# Patient Record
Sex: Female | Born: 1935 | Race: White | Hispanic: No | Marital: Married | Smoking: Never smoker
Health system: Southern US, Community
[De-identification: ages and names within clinical notes are randomized; demographics above are authoritative.]

## PROBLEM LIST (undated history)

## (undated) DIAGNOSIS — R21 Rash and other nonspecific skin eruption: Secondary | ICD-10-CM

## (undated) DIAGNOSIS — F419 Anxiety disorder, unspecified: Secondary | ICD-10-CM

## (undated) DIAGNOSIS — M1990 Unspecified osteoarthritis, unspecified site: Secondary | ICD-10-CM

## (undated) DIAGNOSIS — K449 Diaphragmatic hernia without obstruction or gangrene: Secondary | ICD-10-CM

## (undated) DIAGNOSIS — I639 Cerebral infarction, unspecified: Secondary | ICD-10-CM

## (undated) DIAGNOSIS — E039 Hypothyroidism, unspecified: Secondary | ICD-10-CM

## (undated) DIAGNOSIS — K5790 Diverticulosis of intestine, part unspecified, without perforation or abscess without bleeding: Secondary | ICD-10-CM

## (undated) DIAGNOSIS — K5520 Angiodysplasia of colon without hemorrhage: Secondary | ICD-10-CM

## (undated) DIAGNOSIS — T4145XA Adverse effect of unspecified anesthetic, initial encounter: Secondary | ICD-10-CM

## (undated) DIAGNOSIS — I4891 Unspecified atrial fibrillation: Secondary | ICD-10-CM

## (undated) DIAGNOSIS — I5189 Other ill-defined heart diseases: Secondary | ICD-10-CM

## (undated) DIAGNOSIS — K639 Disease of intestine, unspecified: Secondary | ICD-10-CM

## (undated) DIAGNOSIS — K648 Other hemorrhoids: Secondary | ICD-10-CM

## (undated) DIAGNOSIS — I10 Essential (primary) hypertension: Secondary | ICD-10-CM

## (undated) DIAGNOSIS — J449 Chronic obstructive pulmonary disease, unspecified: Secondary | ICD-10-CM

## (undated) DIAGNOSIS — K644 Residual hemorrhoidal skin tags: Secondary | ICD-10-CM

## (undated) DIAGNOSIS — I425 Other restrictive cardiomyopathy: Secondary | ICD-10-CM

## (undated) DIAGNOSIS — I509 Heart failure, unspecified: Secondary | ICD-10-CM

## (undated) DIAGNOSIS — E785 Hyperlipidemia, unspecified: Secondary | ICD-10-CM

## (undated) DIAGNOSIS — Z8489 Family history of other specified conditions: Secondary | ICD-10-CM

## (undated) DIAGNOSIS — K270 Acute peptic ulcer, site unspecified, with hemorrhage: Secondary | ICD-10-CM

## (undated) DIAGNOSIS — G4730 Sleep apnea, unspecified: Secondary | ICD-10-CM

## (undated) DIAGNOSIS — E611 Iron deficiency: Secondary | ICD-10-CM

## (undated) DIAGNOSIS — Z9989 Dependence on other enabling machines and devices: Secondary | ICD-10-CM

## (undated) DIAGNOSIS — D126 Benign neoplasm of colon, unspecified: Secondary | ICD-10-CM

## (undated) DIAGNOSIS — K219 Gastro-esophageal reflux disease without esophagitis: Secondary | ICD-10-CM

## (undated) DIAGNOSIS — I2510 Atherosclerotic heart disease of native coronary artery without angina pectoris: Secondary | ICD-10-CM

## (undated) DIAGNOSIS — Z9981 Dependence on supplemental oxygen: Secondary | ICD-10-CM

## (undated) DIAGNOSIS — N19 Unspecified kidney failure: Secondary | ICD-10-CM

## (undated) DIAGNOSIS — T8859XA Other complications of anesthesia, initial encounter: Secondary | ICD-10-CM

## (undated) DIAGNOSIS — D649 Anemia, unspecified: Secondary | ICD-10-CM

## (undated) HISTORY — DX: Heart failure, unspecified: I50.9

## (undated) HISTORY — PX: MOLE REMOVAL: SHX2046

## (undated) HISTORY — DX: Unspecified atrial fibrillation: I48.91

## (undated) HISTORY — DX: Hyperlipidemia, unspecified: E78.5

## (undated) HISTORY — DX: Diaphragmatic hernia without obstruction or gangrene: K44.9

## (undated) HISTORY — DX: Other restrictive cardiomyopathy: I42.5

## (undated) HISTORY — DX: Dependence on supplemental oxygen: Z99.81

## (undated) HISTORY — DX: Hypothyroidism, unspecified: E03.9

## (undated) HISTORY — PX: BASCILIC VEIN TRANSPOSITION: SHX5742

## (undated) HISTORY — PX: GIVENS CAPSULE STUDY: SHX5432

## (undated) HISTORY — DX: Cerebral infarction, unspecified: I63.9

## (undated) HISTORY — DX: Essential (primary) hypertension: I10

## (undated) HISTORY — PX: VAGINAL HYSTERECTOMY: SUR661

## (undated) HISTORY — PX: ESOPHAGOGASTRODUODENOSCOPY: SHX1529

## (undated) HISTORY — DX: Atherosclerotic heart disease of native coronary artery without angina pectoris: I25.10

## (undated) HISTORY — DX: Sleep apnea, unspecified: G47.30

## (undated) HISTORY — DX: Diverticulosis of intestine, part unspecified, without perforation or abscess without bleeding: K57.90

## (undated) HISTORY — DX: Benign neoplasm of colon, unspecified: D12.6

## (undated) HISTORY — DX: Anxiety disorder, unspecified: F41.9

## (undated) HISTORY — PX: CARDIAC CATHETERIZATION: SHX172

## (undated) HISTORY — DX: Gastro-esophageal reflux disease without esophagitis: K21.9

## (undated) HISTORY — DX: Acute peptic ulcer, site unspecified, with hemorrhage: K27.0

## (undated) HISTORY — DX: Chronic obstructive pulmonary disease, unspecified: J44.9

## (undated) HISTORY — PX: COLONOSCOPY: SHX174

## (undated) HISTORY — DX: Dependence on other enabling machines and devices: Z99.89

## (undated) HISTORY — DX: Other hemorrhoids: K64.8

## (undated) HISTORY — DX: Residual hemorrhoidal skin tags: K64.4

## (undated) HISTORY — DX: Iron deficiency: E61.1

## (undated) HISTORY — DX: Other ill-defined heart diseases: I51.89

## (undated) HISTORY — PX: APPENDECTOMY: SHX54

## (undated) HISTORY — PX: OOPHORECTOMY: SHX86

## (undated) HISTORY — DX: Morbid (severe) obesity due to excess calories: E66.01

## (undated) HISTORY — DX: Unspecified osteoarthritis, unspecified site: M19.90

## (undated) HISTORY — DX: Unspecified kidney failure: N19

## (undated) HISTORY — DX: Anemia, unspecified: D64.9

---

## 1994-12-10 DIAGNOSIS — I4891 Unspecified atrial fibrillation: Secondary | ICD-10-CM

## 1994-12-10 HISTORY — DX: Unspecified atrial fibrillation: I48.91

## 1997-12-10 DIAGNOSIS — I639 Cerebral infarction, unspecified: Secondary | ICD-10-CM

## 1997-12-10 HISTORY — DX: Cerebral infarction, unspecified: I63.9

## 1999-03-22 ENCOUNTER — Ambulatory Visit (HOSPITAL_COMMUNITY): Admission: RE | Admit: 1999-03-22 | Discharge: 1999-03-22 | Payer: Self-pay | Admitting: Internal Medicine

## 1999-11-29 ENCOUNTER — Encounter: Admission: RE | Admit: 1999-11-29 | Discharge: 1999-11-29 | Payer: Self-pay | Admitting: Internal Medicine

## 1999-11-29 ENCOUNTER — Encounter: Payer: Self-pay | Admitting: Internal Medicine

## 2000-04-20 ENCOUNTER — Inpatient Hospital Stay (HOSPITAL_COMMUNITY): Admission: EM | Admit: 2000-04-20 | Discharge: 2000-04-24 | Payer: Self-pay | Admitting: Emergency Medicine

## 2000-04-20 ENCOUNTER — Encounter (INDEPENDENT_AMBULATORY_CARE_PROVIDER_SITE_OTHER): Payer: Self-pay

## 2000-04-22 ENCOUNTER — Encounter: Payer: Self-pay | Admitting: Internal Medicine

## 2000-06-25 ENCOUNTER — Encounter: Payer: Self-pay | Admitting: Gastroenterology

## 2000-06-25 ENCOUNTER — Other Ambulatory Visit: Admission: RE | Admit: 2000-06-25 | Discharge: 2000-06-25 | Payer: Self-pay | Admitting: Internal Medicine

## 2000-06-25 ENCOUNTER — Encounter (INDEPENDENT_AMBULATORY_CARE_PROVIDER_SITE_OTHER): Payer: Self-pay | Admitting: Specialist

## 2000-11-29 ENCOUNTER — Encounter: Payer: Self-pay | Admitting: Internal Medicine

## 2000-11-29 ENCOUNTER — Encounter: Admission: RE | Admit: 2000-11-29 | Discharge: 2000-11-29 | Payer: Self-pay | Admitting: Internal Medicine

## 2002-07-02 ENCOUNTER — Encounter: Admission: RE | Admit: 2002-07-02 | Discharge: 2002-07-02 | Payer: Self-pay | Admitting: Internal Medicine

## 2002-07-02 ENCOUNTER — Encounter: Payer: Self-pay | Admitting: Internal Medicine

## 2003-08-26 ENCOUNTER — Encounter: Admission: RE | Admit: 2003-08-26 | Discharge: 2003-08-26 | Payer: Self-pay | Admitting: Internal Medicine

## 2003-08-26 ENCOUNTER — Encounter: Payer: Self-pay | Admitting: Internal Medicine

## 2003-09-07 ENCOUNTER — Encounter: Payer: Self-pay | Admitting: Internal Medicine

## 2003-09-07 ENCOUNTER — Encounter: Admission: RE | Admit: 2003-09-07 | Discharge: 2003-09-07 | Payer: Self-pay | Admitting: Internal Medicine

## 2003-09-24 ENCOUNTER — Encounter: Admission: RE | Admit: 2003-09-24 | Discharge: 2003-09-24 | Payer: Self-pay | Admitting: Internal Medicine

## 2003-09-24 ENCOUNTER — Encounter: Payer: Self-pay | Admitting: Internal Medicine

## 2004-03-23 ENCOUNTER — Inpatient Hospital Stay (HOSPITAL_COMMUNITY): Admission: RE | Admit: 2004-03-23 | Discharge: 2004-03-29 | Payer: Self-pay | Admitting: Cardiology

## 2004-03-28 ENCOUNTER — Encounter (INDEPENDENT_AMBULATORY_CARE_PROVIDER_SITE_OTHER): Payer: Self-pay | Admitting: *Deleted

## 2004-03-28 ENCOUNTER — Encounter: Payer: Self-pay | Admitting: Gastroenterology

## 2004-10-03 ENCOUNTER — Encounter: Admission: RE | Admit: 2004-10-03 | Discharge: 2004-10-03 | Payer: Self-pay | Admitting: Internal Medicine

## 2004-10-20 ENCOUNTER — Ambulatory Visit: Payer: Self-pay | Admitting: Cardiology

## 2004-11-10 ENCOUNTER — Ambulatory Visit: Payer: Self-pay | Admitting: Cardiology

## 2004-11-16 ENCOUNTER — Ambulatory Visit: Payer: Self-pay | Admitting: Endocrinology

## 2004-11-24 ENCOUNTER — Ambulatory Visit: Payer: Self-pay | Admitting: Cardiology

## 2004-12-21 ENCOUNTER — Ambulatory Visit: Payer: Self-pay

## 2005-01-04 ENCOUNTER — Ambulatory Visit: Payer: Self-pay | Admitting: Internal Medicine

## 2005-01-22 ENCOUNTER — Ambulatory Visit: Payer: Self-pay | Admitting: Cardiology

## 2005-02-19 ENCOUNTER — Ambulatory Visit: Payer: Self-pay | Admitting: Cardiology

## 2005-03-12 ENCOUNTER — Ambulatory Visit: Payer: Self-pay | Admitting: Cardiology

## 2005-04-09 ENCOUNTER — Ambulatory Visit: Payer: Self-pay | Admitting: Cardiology

## 2005-04-12 ENCOUNTER — Ambulatory Visit: Payer: Self-pay | Admitting: Internal Medicine

## 2005-05-08 ENCOUNTER — Ambulatory Visit: Payer: Self-pay | Admitting: Cardiology

## 2005-06-05 ENCOUNTER — Ambulatory Visit: Payer: Self-pay | Admitting: Cardiology

## 2005-06-22 ENCOUNTER — Ambulatory Visit: Payer: Self-pay | Admitting: Cardiovascular Disease

## 2005-07-03 ENCOUNTER — Ambulatory Visit: Payer: Self-pay | Admitting: Cardiology

## 2005-07-20 ENCOUNTER — Ambulatory Visit: Payer: Self-pay | Admitting: Cardiology

## 2005-08-17 ENCOUNTER — Ambulatory Visit: Payer: Self-pay | Admitting: Internal Medicine

## 2005-09-04 ENCOUNTER — Ambulatory Visit: Payer: Self-pay | Admitting: *Deleted

## 2005-10-01 ENCOUNTER — Ambulatory Visit: Payer: Self-pay | Admitting: Cardiology

## 2005-10-05 ENCOUNTER — Encounter: Admission: RE | Admit: 2005-10-05 | Discharge: 2005-10-05 | Payer: Self-pay | Admitting: Internal Medicine

## 2005-10-15 ENCOUNTER — Ambulatory Visit: Payer: Self-pay | Admitting: Internal Medicine

## 2005-10-16 ENCOUNTER — Ambulatory Visit: Payer: Self-pay | Admitting: Internal Medicine

## 2005-10-23 ENCOUNTER — Ambulatory Visit: Payer: Self-pay | Admitting: Endocrinology

## 2005-11-05 ENCOUNTER — Ambulatory Visit: Payer: Self-pay | Admitting: Cardiology

## 2005-11-26 ENCOUNTER — Ambulatory Visit: Payer: Self-pay | Admitting: Cardiology

## 2005-12-10 DIAGNOSIS — J449 Chronic obstructive pulmonary disease, unspecified: Secondary | ICD-10-CM

## 2005-12-10 HISTORY — DX: Chronic obstructive pulmonary disease, unspecified: J44.9

## 2005-12-24 ENCOUNTER — Ambulatory Visit: Payer: Self-pay

## 2006-01-23 ENCOUNTER — Ambulatory Visit: Payer: Self-pay | Admitting: Internal Medicine

## 2006-02-20 ENCOUNTER — Ambulatory Visit: Payer: Self-pay | Admitting: Cardiology

## 2006-02-26 ENCOUNTER — Ambulatory Visit: Payer: Self-pay | Admitting: Cardiology

## 2006-03-04 ENCOUNTER — Ambulatory Visit: Payer: Self-pay | Admitting: Internal Medicine

## 2006-03-05 ENCOUNTER — Ambulatory Visit: Payer: Self-pay | Admitting: Internal Medicine

## 2006-03-07 ENCOUNTER — Ambulatory Visit: Payer: Self-pay | Admitting: Internal Medicine

## 2006-03-11 ENCOUNTER — Ambulatory Visit: Payer: Self-pay | Admitting: Cardiology

## 2006-03-20 ENCOUNTER — Ambulatory Visit: Payer: Self-pay | Admitting: *Deleted

## 2006-04-10 ENCOUNTER — Ambulatory Visit: Payer: Self-pay

## 2006-04-15 ENCOUNTER — Ambulatory Visit: Payer: Self-pay | Admitting: Internal Medicine

## 2006-04-17 ENCOUNTER — Ambulatory Visit: Payer: Self-pay | Admitting: Cardiology

## 2006-04-17 ENCOUNTER — Encounter: Admission: RE | Admit: 2006-04-17 | Discharge: 2006-04-17 | Payer: Self-pay | Admitting: Nephrology

## 2006-05-03 ENCOUNTER — Ambulatory Visit: Payer: Self-pay | Admitting: Pulmonary Disease

## 2006-05-08 ENCOUNTER — Ambulatory Visit: Payer: Self-pay | Admitting: Internal Medicine

## 2006-05-16 ENCOUNTER — Ambulatory Visit: Payer: Self-pay | Admitting: Cardiology

## 2006-05-16 ENCOUNTER — Ambulatory Visit: Payer: Self-pay

## 2006-05-30 ENCOUNTER — Ambulatory Visit: Payer: Self-pay | Admitting: Internal Medicine

## 2006-06-03 ENCOUNTER — Ambulatory Visit: Payer: Self-pay | Admitting: Cardiology

## 2006-06-06 ENCOUNTER — Ambulatory Visit: Payer: Self-pay | Admitting: Internal Medicine

## 2006-06-06 ENCOUNTER — Inpatient Hospital Stay (HOSPITAL_COMMUNITY): Admission: AD | Admit: 2006-06-06 | Discharge: 2006-06-10 | Payer: Self-pay | Admitting: Internal Medicine

## 2006-06-17 ENCOUNTER — Ambulatory Visit: Payer: Self-pay | Admitting: Internal Medicine

## 2006-06-20 ENCOUNTER — Ambulatory Visit: Payer: Self-pay | Admitting: Internal Medicine

## 2006-06-25 ENCOUNTER — Ambulatory Visit: Payer: Self-pay | Admitting: Cardiology

## 2006-06-26 ENCOUNTER — Ambulatory Visit: Payer: Self-pay | Admitting: *Deleted

## 2006-07-02 ENCOUNTER — Ambulatory Visit: Payer: Self-pay | Admitting: *Deleted

## 2006-07-02 ENCOUNTER — Ambulatory Visit: Payer: Self-pay

## 2006-07-18 ENCOUNTER — Ambulatory Visit: Payer: Self-pay | Admitting: Cardiology

## 2006-07-26 ENCOUNTER — Ambulatory Visit: Payer: Self-pay | Admitting: Internal Medicine

## 2006-08-06 ENCOUNTER — Ambulatory Visit: Payer: Self-pay | Admitting: *Deleted

## 2006-08-23 ENCOUNTER — Ambulatory Visit: Payer: Self-pay | Admitting: Internal Medicine

## 2006-08-26 ENCOUNTER — Ambulatory Visit: Payer: Self-pay | Admitting: Internal Medicine

## 2006-09-20 ENCOUNTER — Ambulatory Visit: Payer: Self-pay | Admitting: Cardiology

## 2006-09-26 ENCOUNTER — Ambulatory Visit: Payer: Self-pay | Admitting: Internal Medicine

## 2006-10-18 ENCOUNTER — Ambulatory Visit: Payer: Self-pay | Admitting: Cardiology

## 2006-10-25 ENCOUNTER — Ambulatory Visit: Payer: Self-pay | Admitting: Cardiovascular Disease

## 2006-10-28 ENCOUNTER — Ambulatory Visit: Payer: Self-pay | Admitting: Cardiology

## 2006-11-07 ENCOUNTER — Ambulatory Visit: Payer: Self-pay | Admitting: Internal Medicine

## 2006-11-14 ENCOUNTER — Encounter: Admission: RE | Admit: 2006-11-14 | Discharge: 2006-11-14 | Payer: Self-pay | Admitting: Internal Medicine

## 2006-11-15 ENCOUNTER — Ambulatory Visit: Payer: Self-pay | Admitting: Cardiology

## 2006-11-25 ENCOUNTER — Encounter: Payer: Self-pay | Admitting: Internal Medicine

## 2006-12-18 ENCOUNTER — Ambulatory Visit: Payer: Self-pay | Admitting: Internal Medicine

## 2006-12-18 LAB — CONVERTED CEMR LAB
ALT: 17 units/L (ref 0–40)
AST: 24 units/L (ref 0–37)
BUN: 36 mg/dL — ABNORMAL HIGH (ref 6–23)
Basophils Absolute: 0 10*3/uL (ref 0.0–0.1)
Basophils Relative: 0.8 % (ref 0.0–1.0)
Creatinine, Ser: 1.3 mg/dL — ABNORMAL HIGH (ref 0.4–1.2)
Eosinophil percent: 4.1 % (ref 0.0–5.0)
HCT: 29.3 % — ABNORMAL LOW (ref 36.0–46.0)
Hemoglobin: 9.8 g/dL — ABNORMAL LOW (ref 12.0–15.0)
Lymphocytes Relative: 13.1 % (ref 12.0–46.0)
MCHC: 33.6 g/dL (ref 30.0–36.0)
MCV: 104.2 fL — ABNORMAL HIGH (ref 78.0–100.0)
Monocytes Absolute: 0.7 10*3/uL (ref 0.2–0.7)
Monocytes Relative: 11.4 % — ABNORMAL HIGH (ref 3.0–11.0)
Neutro Abs: 4.2 10*3/uL (ref 1.4–7.7)
Neutrophils Relative %: 70.6 % (ref 43.0–77.0)
Platelets: 192 10*3/uL (ref 150–400)
Potassium: 4 meq/L (ref 3.5–5.1)
Pro B Natriuretic peptide (BNP): 359 pg/mL — ABNORMAL HIGH (ref 0.0–100.0)
RBC: 2.87 M/uL — ABNORMAL LOW (ref 3.87–5.11)
RDW: 15.5 % — ABNORMAL HIGH (ref 11.5–14.6)
WBC: 5.9 10*3/uL (ref 4.5–10.5)

## 2007-01-07 ENCOUNTER — Ambulatory Visit (HOSPITAL_BASED_OUTPATIENT_CLINIC_OR_DEPARTMENT_OTHER): Admission: RE | Admit: 2007-01-07 | Discharge: 2007-01-07 | Payer: Self-pay | Admitting: Internal Medicine

## 2007-01-09 DIAGNOSIS — I6350 Cerebral infarction due to unspecified occlusion or stenosis of unspecified cerebral artery: Secondary | ICD-10-CM | POA: Insufficient documentation

## 2007-01-09 DIAGNOSIS — Z8711 Personal history of peptic ulcer disease: Secondary | ICD-10-CM

## 2007-01-09 DIAGNOSIS — E039 Hypothyroidism, unspecified: Secondary | ICD-10-CM | POA: Insufficient documentation

## 2007-01-09 DIAGNOSIS — I10 Essential (primary) hypertension: Secondary | ICD-10-CM | POA: Insufficient documentation

## 2007-01-20 ENCOUNTER — Ambulatory Visit (HOSPITAL_BASED_OUTPATIENT_CLINIC_OR_DEPARTMENT_OTHER): Admission: RE | Admit: 2007-01-20 | Discharge: 2007-01-20 | Payer: Self-pay | Admitting: Internal Medicine

## 2007-01-29 ENCOUNTER — Ambulatory Visit: Payer: Self-pay | Admitting: Pulmonary Disease

## 2007-02-10 ENCOUNTER — Ambulatory Visit: Payer: Self-pay | Admitting: Pulmonary Disease

## 2007-02-13 ENCOUNTER — Ambulatory Visit: Payer: Self-pay | Admitting: Cardiology

## 2007-02-13 LAB — CONVERTED CEMR LAB
Basophils Absolute: 0 10*3/uL (ref 0.0–0.1)
Basophils Relative: 0.1 % (ref 0.0–1.0)
Eosinophils Absolute: 0.3 10*3/uL (ref 0.0–0.6)
Eosinophils Relative: 4.4 % (ref 0.0–5.0)
HCT: 34 % — ABNORMAL LOW (ref 36.0–46.0)
Hemoglobin: 11.6 g/dL — ABNORMAL LOW (ref 12.0–15.0)
Lymphocytes Relative: 14.6 % (ref 12.0–46.0)
MCHC: 34 g/dL (ref 30.0–36.0)
MCV: 99.9 fL (ref 78.0–100.0)
Monocytes Absolute: 0.9 10*3/uL — ABNORMAL HIGH (ref 0.2–0.7)
Monocytes Relative: 12.7 % — ABNORMAL HIGH (ref 3.0–11.0)
Neutro Abs: 4.8 10*3/uL (ref 1.4–7.7)
Neutrophils Relative %: 68.2 % (ref 43.0–77.0)
Platelets: 190 10*3/uL (ref 150–400)
RBC: 3.4 M/uL — ABNORMAL LOW (ref 3.87–5.11)
RDW: 13.6 % (ref 11.5–14.6)
WBC: 7 10*3/uL (ref 4.5–10.5)

## 2007-03-04 ENCOUNTER — Ambulatory Visit: Payer: Self-pay | Admitting: Pulmonary Disease

## 2007-03-04 ENCOUNTER — Ambulatory Visit (HOSPITAL_BASED_OUTPATIENT_CLINIC_OR_DEPARTMENT_OTHER): Admission: RE | Admit: 2007-03-04 | Discharge: 2007-03-04 | Payer: Self-pay | Admitting: Pulmonary Disease

## 2007-03-28 ENCOUNTER — Ambulatory Visit: Payer: Self-pay | Admitting: Pulmonary Disease

## 2007-05-09 ENCOUNTER — Telehealth (INDEPENDENT_AMBULATORY_CARE_PROVIDER_SITE_OTHER): Payer: Self-pay | Admitting: *Deleted

## 2007-05-12 ENCOUNTER — Telehealth (INDEPENDENT_AMBULATORY_CARE_PROVIDER_SITE_OTHER): Payer: Self-pay | Admitting: *Deleted

## 2007-05-20 ENCOUNTER — Ambulatory Visit: Payer: Self-pay | Admitting: Internal Medicine

## 2007-05-27 ENCOUNTER — Ambulatory Visit: Payer: Self-pay | Admitting: Internal Medicine

## 2007-05-27 LAB — CONVERTED CEMR LAB
BUN: 91 mg/dL (ref 6–23)
CO2: 37 meq/L — ABNORMAL HIGH (ref 19–32)
Calcium: 9.3 mg/dL (ref 8.4–10.5)
Chloride: 92 meq/L — ABNORMAL LOW (ref 96–112)
Creatinine, Ser: 1.8 mg/dL — ABNORMAL HIGH (ref 0.4–1.2)
GFR calc Af Amer: 35 mL/min
GFR calc non Af Amer: 29 mL/min
Glucose, Bld: 88 mg/dL (ref 70–99)
Potassium: 3.4 meq/L — ABNORMAL LOW (ref 3.5–5.1)
Pro B Natriuretic peptide (BNP): 186 pg/mL — ABNORMAL HIGH (ref 0.0–100.0)
Sodium: 137 meq/L (ref 135–145)
Uric Acid, Serum: 6.8 mg/dL (ref 2.4–7.0)

## 2007-06-04 ENCOUNTER — Ambulatory Visit: Payer: Self-pay | Admitting: Internal Medicine

## 2007-06-04 LAB — CONVERTED CEMR LAB
Chloride: 97 meq/L (ref 96–112)
GFR calc non Af Amer: 46 mL/min
Potassium: 3.8 meq/L (ref 3.5–5.1)
Sodium: 146 meq/L — ABNORMAL HIGH (ref 135–145)

## 2007-06-05 ENCOUNTER — Ambulatory Visit: Payer: Self-pay | Admitting: Internal Medicine

## 2007-06-05 LAB — CONVERTED CEMR LAB: TSH: 2.25 microintl units/mL (ref 0.35–5.50)

## 2007-06-06 ENCOUNTER — Encounter (INDEPENDENT_AMBULATORY_CARE_PROVIDER_SITE_OTHER): Payer: Self-pay | Admitting: *Deleted

## 2007-06-19 ENCOUNTER — Ambulatory Visit: Payer: Self-pay | Admitting: Internal Medicine

## 2007-06-19 DIAGNOSIS — D649 Anemia, unspecified: Secondary | ICD-10-CM

## 2007-06-19 DIAGNOSIS — Z8739 Personal history of other diseases of the musculoskeletal system and connective tissue: Secondary | ICD-10-CM | POA: Insufficient documentation

## 2007-06-22 LAB — CONVERTED CEMR LAB
BUN: 32 mg/dL — ABNORMAL HIGH (ref 6–23)
Basophils Relative: 0.1 % (ref 0.0–1.0)
Creatinine, Ser: 1.2 mg/dL (ref 0.4–1.2)
Hemoglobin: 11.2 g/dL — ABNORMAL LOW (ref 12.0–15.0)
Monocytes Relative: 9 % (ref 3.0–11.0)
Platelets: 205 10*3/uL (ref 150–400)
Potassium: 3.8 meq/L (ref 3.5–5.1)
RDW: 13.8 % (ref 11.5–14.6)
Uric Acid, Serum: 5.9 mg/dL (ref 2.4–7.0)

## 2007-06-23 ENCOUNTER — Encounter (INDEPENDENT_AMBULATORY_CARE_PROVIDER_SITE_OTHER): Payer: Self-pay | Admitting: *Deleted

## 2007-06-25 ENCOUNTER — Ambulatory Visit: Payer: Self-pay | Admitting: Internal Medicine

## 2007-07-04 ENCOUNTER — Ambulatory Visit: Payer: Self-pay | Admitting: Internal Medicine

## 2007-07-18 ENCOUNTER — Ambulatory Visit: Payer: Self-pay | Admitting: Internal Medicine

## 2007-08-15 ENCOUNTER — Ambulatory Visit: Payer: Self-pay | Admitting: Internal Medicine

## 2007-08-29 ENCOUNTER — Encounter: Payer: Self-pay | Admitting: Internal Medicine

## 2007-09-12 ENCOUNTER — Ambulatory Visit: Payer: Self-pay | Admitting: Internal Medicine

## 2007-09-15 ENCOUNTER — Ambulatory Visit: Payer: Self-pay | Admitting: Internal Medicine

## 2007-09-15 ENCOUNTER — Ambulatory Visit: Payer: Self-pay

## 2007-09-15 LAB — CONVERTED CEMR LAB
Basophils Absolute: 0 10*3/uL (ref 0.0–0.1)
Cholesterol: 181 mg/dL (ref 0–200)
Creatinine, Ser: 1.6 mg/dL — ABNORMAL HIGH (ref 0.4–1.2)
Eosinophils Absolute: 0.3 10*3/uL (ref 0.0–0.6)
GFR calc non Af Amer: 33 mL/min
Glucose, Bld: 102 mg/dL — ABNORMAL HIGH (ref 70–99)
HCT: 30.5 % — ABNORMAL LOW (ref 36.0–46.0)
Hemoglobin: 10.6 g/dL — ABNORMAL LOW (ref 12.0–15.0)
MCHC: 34.6 g/dL (ref 30.0–36.0)
MCV: 99.4 fL (ref 78.0–100.0)
Monocytes Absolute: 1 10*3/uL — ABNORMAL HIGH (ref 0.2–0.7)
Neutrophils Relative %: 61.4 % (ref 43.0–77.0)
Potassium: 4.4 meq/L (ref 3.5–5.1)
Sodium: 145 meq/L (ref 135–145)
Uric Acid, Serum: 9.8 mg/dL — ABNORMAL HIGH (ref 2.4–7.0)

## 2007-09-26 ENCOUNTER — Ambulatory Visit: Payer: Self-pay | Admitting: Internal Medicine

## 2007-10-07 ENCOUNTER — Encounter: Payer: Self-pay | Admitting: Internal Medicine

## 2007-10-09 ENCOUNTER — Ambulatory Visit: Payer: Self-pay | Admitting: Internal Medicine

## 2007-10-09 ENCOUNTER — Encounter: Payer: Self-pay | Admitting: Internal Medicine

## 2007-10-13 ENCOUNTER — Ambulatory Visit: Payer: Self-pay | Admitting: Internal Medicine

## 2007-10-28 ENCOUNTER — Ambulatory Visit: Payer: Self-pay | Admitting: Internal Medicine

## 2007-10-31 DIAGNOSIS — G4733 Obstructive sleep apnea (adult) (pediatric): Secondary | ICD-10-CM

## 2007-11-11 ENCOUNTER — Ambulatory Visit: Payer: Self-pay | Admitting: Internal Medicine

## 2007-11-26 ENCOUNTER — Telehealth (INDEPENDENT_AMBULATORY_CARE_PROVIDER_SITE_OTHER): Payer: Self-pay | Admitting: *Deleted

## 2007-11-27 ENCOUNTER — Ambulatory Visit: Payer: Self-pay | Admitting: Internal Medicine

## 2007-12-06 ENCOUNTER — Ambulatory Visit: Payer: Self-pay | Admitting: Family Medicine

## 2007-12-08 ENCOUNTER — Telehealth (INDEPENDENT_AMBULATORY_CARE_PROVIDER_SITE_OTHER): Payer: Self-pay | Admitting: *Deleted

## 2007-12-08 ENCOUNTER — Ambulatory Visit: Payer: Self-pay | Admitting: Internal Medicine

## 2007-12-11 LAB — CONVERTED CEMR LAB
Basophils Absolute: 0.1 10*3/uL (ref 0.0–0.1)
Basophils Relative: 0.9 % (ref 0.0–1.0)
Eosinophils Absolute: 0.1 10*3/uL (ref 0.0–0.6)
Lymphocytes Relative: 4 % — ABNORMAL LOW (ref 12.0–46.0)
MCHC: 33.3 g/dL (ref 30.0–36.0)
Monocytes Relative: 5.3 % (ref 3.0–11.0)
Neutro Abs: 10 10*3/uL — ABNORMAL HIGH (ref 1.4–7.7)
Platelets: 172 10*3/uL (ref 150–400)

## 2007-12-12 ENCOUNTER — Encounter (INDEPENDENT_AMBULATORY_CARE_PROVIDER_SITE_OTHER): Payer: Self-pay | Admitting: *Deleted

## 2007-12-15 ENCOUNTER — Ambulatory Visit: Payer: Self-pay | Admitting: Internal Medicine

## 2007-12-29 ENCOUNTER — Ambulatory Visit: Payer: Self-pay | Admitting: Internal Medicine

## 2008-01-06 ENCOUNTER — Encounter: Admission: RE | Admit: 2008-01-06 | Discharge: 2008-01-06 | Payer: Self-pay | Admitting: Internal Medicine

## 2008-01-07 ENCOUNTER — Ambulatory Visit: Payer: Self-pay | Admitting: Internal Medicine

## 2008-01-08 ENCOUNTER — Ambulatory Visit: Payer: Self-pay | Admitting: Internal Medicine

## 2008-01-08 LAB — CONVERTED CEMR LAB
CO2: 35 meq/L — ABNORMAL HIGH (ref 19–32)
Calcium: 9.4 mg/dL (ref 8.4–10.5)
Chloride: 101 meq/L (ref 96–112)
Creatinine, Ser: 1.2 mg/dL (ref 0.4–1.2)
Glucose, Bld: 130 mg/dL — ABNORMAL HIGH (ref 70–99)
Sodium: 142 meq/L (ref 135–145)

## 2008-01-10 LAB — CONVERTED CEMR LAB
Basophils Relative: 0.1 % (ref 0.0–1.0)
Eosinophils Relative: 2.2 % (ref 0.0–5.0)
HCT: 31.1 % — ABNORMAL LOW (ref 36.0–46.0)
MCV: 101.7 fL — ABNORMAL HIGH (ref 78.0–100.0)
Neutrophils Relative %: 74.4 % (ref 43.0–77.0)
Platelets: 173 10*3/uL (ref 150–400)
RBC: 3.05 M/uL — ABNORMAL LOW (ref 3.87–5.11)
RDW: 15.1 % — ABNORMAL HIGH (ref 11.5–14.6)
WBC: 6.3 10*3/uL (ref 4.5–10.5)

## 2008-01-12 ENCOUNTER — Telehealth (INDEPENDENT_AMBULATORY_CARE_PROVIDER_SITE_OTHER): Payer: Self-pay | Admitting: *Deleted

## 2008-01-15 ENCOUNTER — Ambulatory Visit: Payer: Self-pay | Admitting: Internal Medicine

## 2008-01-16 ENCOUNTER — Ambulatory Visit: Payer: Self-pay | Admitting: Pulmonary Disease

## 2008-01-16 ENCOUNTER — Telehealth (INDEPENDENT_AMBULATORY_CARE_PROVIDER_SITE_OTHER): Payer: Self-pay | Admitting: *Deleted

## 2008-01-16 ENCOUNTER — Encounter: Payer: Self-pay | Admitting: Internal Medicine

## 2008-01-16 DIAGNOSIS — K219 Gastro-esophageal reflux disease without esophagitis: Secondary | ICD-10-CM | POA: Insufficient documentation

## 2008-01-16 DIAGNOSIS — I509 Heart failure, unspecified: Secondary | ICD-10-CM | POA: Insufficient documentation

## 2008-01-16 DIAGNOSIS — R0602 Shortness of breath: Secondary | ICD-10-CM

## 2008-01-29 ENCOUNTER — Ambulatory Visit: Payer: Self-pay | Admitting: Internal Medicine

## 2008-02-06 ENCOUNTER — Ambulatory Visit: Payer: Self-pay | Admitting: Internal Medicine

## 2008-02-06 LAB — CONVERTED CEMR LAB
Calcium: 9.9 mg/dL (ref 8.4–10.5)
Chloride: 100 meq/L (ref 96–112)
Creatinine, Ser: 1.3 mg/dL — ABNORMAL HIGH (ref 0.4–1.2)
GFR calc non Af Amer: 42 mL/min
Glucose, Bld: 98 mg/dL (ref 70–99)
Sodium: 142 meq/L (ref 135–145)

## 2008-02-16 ENCOUNTER — Ambulatory Visit: Payer: Self-pay | Admitting: Pulmonary Disease

## 2008-02-26 ENCOUNTER — Ambulatory Visit: Payer: Self-pay | Admitting: Internal Medicine

## 2008-03-11 ENCOUNTER — Ambulatory Visit: Payer: Self-pay | Admitting: Internal Medicine

## 2008-03-22 ENCOUNTER — Ambulatory Visit: Payer: Self-pay | Admitting: Gastroenterology

## 2008-03-23 ENCOUNTER — Ambulatory Visit: Payer: Self-pay | Admitting: Gastroenterology

## 2008-03-23 LAB — CONVERTED CEMR LAB
Ferritin: 93.8 ng/mL (ref 10.0–291.0)
Folate: 8 ng/mL
Iron: 74 ug/dL (ref 42–145)
Transferrin: 223.4 mg/dL (ref >=212.0)

## 2008-03-25 ENCOUNTER — Ambulatory Visit: Payer: Self-pay | Admitting: Internal Medicine

## 2008-04-06 ENCOUNTER — Telehealth (INDEPENDENT_AMBULATORY_CARE_PROVIDER_SITE_OTHER): Payer: Self-pay | Admitting: *Deleted

## 2008-04-09 ENCOUNTER — Encounter (INDEPENDENT_AMBULATORY_CARE_PROVIDER_SITE_OTHER): Payer: Self-pay | Admitting: Internal Medicine

## 2008-04-09 ENCOUNTER — Encounter: Payer: Self-pay | Admitting: Internal Medicine

## 2008-04-09 ENCOUNTER — Ambulatory Visit: Payer: Self-pay | Admitting: Internal Medicine

## 2008-04-09 ENCOUNTER — Inpatient Hospital Stay (HOSPITAL_COMMUNITY): Admission: AD | Admit: 2008-04-09 | Discharge: 2008-04-14 | Payer: Self-pay | Admitting: Internal Medicine

## 2008-04-09 DIAGNOSIS — L03319 Cellulitis of trunk, unspecified: Secondary | ICD-10-CM

## 2008-04-09 DIAGNOSIS — L02219 Cutaneous abscess of trunk, unspecified: Secondary | ICD-10-CM

## 2008-04-09 HISTORY — PX: OTHER SURGICAL HISTORY: SHX169

## 2008-04-10 ENCOUNTER — Encounter: Payer: Self-pay | Admitting: Internal Medicine

## 2008-04-12 ENCOUNTER — Encounter: Payer: Self-pay | Admitting: Internal Medicine

## 2008-04-15 ENCOUNTER — Telehealth: Payer: Self-pay | Admitting: Gastroenterology

## 2008-04-19 ENCOUNTER — Ambulatory Visit: Payer: Self-pay | Admitting: Gastroenterology

## 2008-04-23 ENCOUNTER — Ambulatory Visit: Payer: Self-pay | Admitting: Internal Medicine

## 2008-04-23 DIAGNOSIS — L03818 Cellulitis of other sites: Secondary | ICD-10-CM

## 2008-04-23 DIAGNOSIS — L02818 Cutaneous abscess of other sites: Secondary | ICD-10-CM

## 2008-04-23 LAB — CONVERTED CEMR LAB
BUN: 30 mg/dL — ABNORMAL HIGH (ref 6–23)
Basophils Absolute: 0 10*3/uL (ref 0.0–0.1)
Basophils Relative: 0.3 % (ref 0.0–1.0)
CO2: 36 meq/L — ABNORMAL HIGH (ref 19–32)
Calcium: 9.4 mg/dL (ref 8.4–10.5)
Chloride: 102 meq/L (ref 96–112)
Cholesterol: 148 mg/dL (ref 0–200)
Creatinine, Ser: 1.3 mg/dL — ABNORMAL HIGH (ref 0.4–1.2)
Eosinophils Relative: 3.6 % (ref 0.0–5.0)
Glucose, Bld: 84 mg/dL (ref 70–99)
Hemoglobin: 9.6 g/dL — ABNORMAL LOW (ref 12.0–15.0)
LDL Cholesterol: 83 mg/dL (ref 0–99)
Lymphocytes Relative: 14.6 % (ref 12.0–46.0)
MCHC: 32.3 g/dL (ref 30.0–36.0)
MCV: 98.9 fL (ref 78.0–100.0)
Neutro Abs: 3.3 10*3/uL (ref 1.4–7.7)
Neutrophils Relative %: 63.7 % (ref 43.0–77.0)
RBC: 3.01 M/uL — ABNORMAL LOW (ref 3.87–5.11)
VLDL: 32 mg/dL (ref 0–40)
WBC: 5.2 10*3/uL (ref 4.5–10.5)

## 2008-04-28 ENCOUNTER — Ambulatory Visit: Payer: Self-pay | Admitting: Internal Medicine

## 2008-04-30 ENCOUNTER — Encounter: Payer: Self-pay | Admitting: Internal Medicine

## 2008-04-30 ENCOUNTER — Ambulatory Visit: Payer: Self-pay | Admitting: Cardiovascular Disease

## 2008-05-12 ENCOUNTER — Ambulatory Visit: Payer: Self-pay | Admitting: Internal Medicine

## 2008-05-20 ENCOUNTER — Ambulatory Visit: Payer: Self-pay | Admitting: Internal Medicine

## 2008-05-21 ENCOUNTER — Encounter: Payer: Self-pay | Admitting: Internal Medicine

## 2008-05-24 ENCOUNTER — Ambulatory Visit: Payer: Self-pay | Admitting: Internal Medicine

## 2008-05-24 LAB — CONVERTED CEMR LAB
BUN: 41 mg/dL — ABNORMAL HIGH (ref 6–23)
CO2: 34 meq/L — ABNORMAL HIGH (ref 19–32)
Chloride: 98 meq/L (ref 96–112)
Creatinine, Ser: 1.3 mg/dL — ABNORMAL HIGH (ref 0.4–1.2)
GFR calc non Af Amer: 42 mL/min
Glucose, Bld: 94 mg/dL (ref 70–99)
Potassium: 4.2 meq/L (ref 3.5–5.1)

## 2008-06-17 ENCOUNTER — Ambulatory Visit: Payer: Self-pay | Admitting: Internal Medicine

## 2008-06-17 ENCOUNTER — Encounter: Payer: Self-pay | Admitting: Internal Medicine

## 2008-07-07 ENCOUNTER — Encounter: Payer: Self-pay | Admitting: Internal Medicine

## 2008-07-11 ENCOUNTER — Encounter: Payer: Self-pay | Admitting: Internal Medicine

## 2008-07-11 DIAGNOSIS — I4891 Unspecified atrial fibrillation: Secondary | ICD-10-CM | POA: Insufficient documentation

## 2008-07-13 ENCOUNTER — Ambulatory Visit: Payer: Self-pay | Admitting: Internal Medicine

## 2008-07-15 ENCOUNTER — Ambulatory Visit: Payer: Self-pay | Admitting: Internal Medicine

## 2008-07-15 LAB — CONVERTED CEMR LAB
AST: 30 units/L (ref 0–37)
Basophils Absolute: 0 10*3/uL (ref 0.0–0.1)
Basophils Relative: 0 % (ref 0.0–3.0)
CO2: 36 meq/L — ABNORMAL HIGH (ref 19–32)
Calcium: 9.4 mg/dL (ref 8.4–10.5)
Creatinine, Ser: 1.3 mg/dL — ABNORMAL HIGH (ref 0.4–1.2)
GFR calc Af Amer: 51 mL/min
HCT: 26 % — ABNORMAL LOW (ref 36.0–46.0)
Hemoglobin: 8.4 g/dL — ABNORMAL LOW (ref 12.0–15.0)
Lymphocytes Relative: 13 % (ref 12.0–46.0)
MCHC: 32.5 g/dL (ref 30.0–36.0)
MCV: 102.7 fL — ABNORMAL HIGH (ref 78.0–100.0)
Monocytes Absolute: 0.6 10*3/uL (ref 0.1–1.0)
Neutro Abs: 4.7 10*3/uL (ref 1.4–7.7)
RBC: 2.53 M/uL — ABNORMAL LOW (ref 3.87–5.11)
RDW: 16.3 % — ABNORMAL HIGH (ref 11.5–14.6)

## 2008-07-16 ENCOUNTER — Telehealth: Payer: Self-pay | Admitting: Gastroenterology

## 2008-07-16 ENCOUNTER — Ambulatory Visit: Payer: Self-pay | Admitting: Gastroenterology

## 2008-07-16 DIAGNOSIS — K921 Melena: Secondary | ICD-10-CM | POA: Insufficient documentation

## 2008-07-16 DIAGNOSIS — K625 Hemorrhage of anus and rectum: Secondary | ICD-10-CM | POA: Insufficient documentation

## 2008-07-19 ENCOUNTER — Ambulatory Visit (HOSPITAL_COMMUNITY): Admission: RE | Admit: 2008-07-19 | Discharge: 2008-07-19 | Payer: Self-pay | Admitting: Gastroenterology

## 2008-07-21 ENCOUNTER — Ambulatory Visit: Payer: Self-pay | Admitting: Gastroenterology

## 2008-07-23 LAB — CONVERTED CEMR LAB
Basophils Absolute: 0 10*3/uL (ref 0.0–0.1)
Basophils Relative: 0 % (ref 0.0–3.0)
Eosinophils Absolute: 0.3 10*3/uL (ref 0.0–0.7)
Hemoglobin: 10.1 g/dL — ABNORMAL LOW (ref 12.0–15.0)
MCHC: 33.1 g/dL (ref 30.0–36.0)
MCV: 100.8 fL — ABNORMAL HIGH (ref 78.0–100.0)
Neutro Abs: 4.3 10*3/uL (ref 1.4–7.7)
Neutrophils Relative %: 73.4 % (ref 43.0–77.0)
RBC: 3.04 M/uL — ABNORMAL LOW (ref 3.87–5.11)
RDW: 17.2 % — ABNORMAL HIGH (ref 11.5–14.6)

## 2008-08-02 ENCOUNTER — Ambulatory Visit: Payer: Self-pay | Admitting: Internal Medicine

## 2008-08-02 LAB — CONVERTED CEMR LAB
BUN: 45 mg/dL — ABNORMAL HIGH (ref 6–23)
Basophils Relative: 0.1 % (ref 0.0–3.0)
Calcium: 9.4 mg/dL (ref 8.4–10.5)
Eosinophils Relative: 4.7 % (ref 0.0–5.0)
GFR calc Af Amer: 47 mL/min
Glucose, Bld: 91 mg/dL (ref 70–99)
HCT: 33.3 % — ABNORMAL LOW (ref 36.0–46.0)
Hemoglobin: 11.2 g/dL — ABNORMAL LOW (ref 12.0–15.0)
INR: 1 (ref 0.8–1.0)
Monocytes Absolute: 0.7 10*3/uL (ref 0.1–1.0)
Monocytes Relative: 10.7 % (ref 3.0–12.0)
Neutro Abs: 4.5 10*3/uL (ref 1.4–7.7)
Potassium: 4.4 meq/L (ref 3.5–5.1)
WBC: 6.4 10*3/uL (ref 4.5–10.5)

## 2008-08-05 DIAGNOSIS — Z860100 Personal history of colon polyps, unspecified: Secondary | ICD-10-CM | POA: Insufficient documentation

## 2008-08-06 ENCOUNTER — Inpatient Hospital Stay (HOSPITAL_BASED_OUTPATIENT_CLINIC_OR_DEPARTMENT_OTHER): Admission: RE | Admit: 2008-08-06 | Discharge: 2008-08-06 | Payer: Self-pay | Admitting: Cardiovascular Disease

## 2008-08-06 ENCOUNTER — Ambulatory Visit: Payer: Self-pay | Admitting: Internal Medicine

## 2008-08-06 DIAGNOSIS — I2510 Atherosclerotic heart disease of native coronary artery without angina pectoris: Secondary | ICD-10-CM

## 2008-08-06 HISTORY — DX: Atherosclerotic heart disease of native coronary artery without angina pectoris: I25.10

## 2008-08-09 ENCOUNTER — Ambulatory Visit: Payer: Self-pay | Admitting: Gastroenterology

## 2008-08-09 DIAGNOSIS — D509 Iron deficiency anemia, unspecified: Secondary | ICD-10-CM

## 2008-08-10 ENCOUNTER — Ambulatory Visit: Payer: Self-pay | Admitting: Internal Medicine

## 2008-08-10 ENCOUNTER — Ambulatory Visit: Payer: Self-pay | Admitting: Cardiology

## 2008-08-10 LAB — CONVERTED CEMR LAB
BUN: 58 mg/dL — ABNORMAL HIGH (ref 6–23)
GFR calc Af Amer: 43 mL/min
Glucose, Bld: 85 mg/dL (ref 70–99)
Potassium: 4.1 meq/L (ref 3.5–5.1)

## 2008-08-19 ENCOUNTER — Ambulatory Visit: Payer: Self-pay | Admitting: Internal Medicine

## 2008-08-23 ENCOUNTER — Ambulatory Visit: Payer: Self-pay | Admitting: Internal Medicine

## 2008-08-23 LAB — CONVERTED CEMR LAB
Basophils Absolute: 0 10*3/uL (ref 0.0–0.1)
Basophils Relative: 0.1 % (ref 0.0–3.0)
Calcium: 9.3 mg/dL (ref 8.4–10.5)
Chloride: 102 meq/L (ref 96–112)
Creatinine, Ser: 1.4 mg/dL — ABNORMAL HIGH (ref 0.4–1.2)
Eosinophils Absolute: 0.4 10*3/uL (ref 0.0–0.7)
GFR calc Af Amer: 47 mL/min
GFR calc non Af Amer: 39 mL/min
MCHC: 33.6 g/dL (ref 30.0–36.0)
MCV: 101.2 fL — ABNORMAL HIGH (ref 78.0–100.0)
Monocytes Absolute: 0.7 10*3/uL (ref 0.1–1.0)
Neutro Abs: 4.3 10*3/uL (ref 1.4–7.7)
Neutrophils Relative %: 70.5 % (ref 43.0–77.0)
Pro B Natriuretic peptide (BNP): 250 pg/mL — ABNORMAL HIGH (ref 0.0–100.0)
RBC: 3.15 M/uL — ABNORMAL LOW (ref 3.87–5.11)

## 2008-08-26 ENCOUNTER — Ambulatory Visit: Payer: Self-pay | Admitting: Internal Medicine

## 2008-09-01 ENCOUNTER — Ambulatory Visit: Payer: Self-pay | Admitting: Cardiology

## 2008-09-09 ENCOUNTER — Ambulatory Visit: Payer: Self-pay | Admitting: Internal Medicine

## 2008-09-20 ENCOUNTER — Telehealth: Payer: Self-pay | Admitting: Gastroenterology

## 2008-09-22 ENCOUNTER — Ambulatory Visit: Payer: Self-pay | Admitting: Gastroenterology

## 2008-09-22 DIAGNOSIS — K648 Other hemorrhoids: Secondary | ICD-10-CM

## 2008-09-22 DIAGNOSIS — R198 Other specified symptoms and signs involving the digestive system and abdomen: Secondary | ICD-10-CM

## 2008-09-22 DIAGNOSIS — K644 Residual hemorrhoidal skin tags: Secondary | ICD-10-CM

## 2008-09-23 ENCOUNTER — Ambulatory Visit: Payer: Self-pay | Admitting: Internal Medicine

## 2008-09-23 ENCOUNTER — Telehealth: Payer: Self-pay | Admitting: Gastroenterology

## 2008-09-23 LAB — CONVERTED CEMR LAB
Basophils Absolute: 0 10*3/uL (ref 0.0–0.1)
Lymphocytes Relative: 13.8 % (ref 12.0–46.0)
Monocytes Relative: 12.1 % — ABNORMAL HIGH (ref 3.0–12.0)
Neutrophils Relative %: 69.4 % (ref 43.0–77.0)
Platelets: 147 10*3/uL — ABNORMAL LOW (ref 150–400)
RDW: 15.8 % — ABNORMAL HIGH (ref 11.5–14.6)

## 2008-09-24 ENCOUNTER — Telehealth (INDEPENDENT_AMBULATORY_CARE_PROVIDER_SITE_OTHER): Payer: Self-pay | Admitting: *Deleted

## 2008-09-24 ENCOUNTER — Telehealth: Payer: Self-pay | Admitting: Gastroenterology

## 2008-09-27 ENCOUNTER — Ambulatory Visit: Payer: Self-pay | Admitting: Internal Medicine

## 2008-09-30 ENCOUNTER — Ambulatory Visit: Payer: Self-pay | Admitting: Internal Medicine

## 2008-09-30 ENCOUNTER — Telehealth: Payer: Self-pay | Admitting: Gastroenterology

## 2008-09-30 LAB — CONVERTED CEMR LAB
Basophils Relative: 0.5 % (ref 0.0–3.0)
Eosinophils Absolute: 0.3 10*3/uL (ref 0.0–0.7)
Eosinophils Relative: 4.2 % (ref 0.0–5.0)
HCT: 29 % — ABNORMAL LOW (ref 36.0–46.0)
MCV: 103 fL — ABNORMAL HIGH (ref 78.0–100.0)
Monocytes Absolute: 0.7 10*3/uL (ref 0.1–1.0)
Monocytes Relative: 11.1 % (ref 3.0–12.0)
RBC: 2.81 M/uL — ABNORMAL LOW (ref 3.87–5.11)
WBC: 6.3 10*3/uL (ref 4.5–10.5)

## 2008-10-04 ENCOUNTER — Ambulatory Visit: Payer: Self-pay | Admitting: Internal Medicine

## 2008-10-04 ENCOUNTER — Ambulatory Visit: Payer: Self-pay

## 2008-10-07 ENCOUNTER — Ambulatory Visit: Payer: Self-pay | Admitting: Internal Medicine

## 2008-10-07 LAB — CONVERTED CEMR LAB
Basophils Absolute: 0 10*3/uL (ref 0.0–0.1)
CO2: 31 meq/L (ref 19–32)
Calcium: 8.7 mg/dL (ref 8.4–10.5)
Creatinine, Ser: 1.7 mg/dL — ABNORMAL HIGH (ref 0.4–1.2)
Eosinophils Absolute: 0.2 10*3/uL (ref 0.0–0.7)
GFR calc Af Amer: 37 mL/min
GFR calc non Af Amer: 31 mL/min
Hemoglobin: 8.6 g/dL — ABNORMAL LOW (ref 12.0–15.0)
Lymphocytes Relative: 12 % (ref 12.0–46.0)
MCHC: 33.5 g/dL (ref 30.0–36.0)
MCV: 102.1 fL — ABNORMAL HIGH (ref 78.0–100.0)
Neutro Abs: 3.7 10*3/uL (ref 1.4–7.7)
Pro B Natriuretic peptide (BNP): 270 pg/mL — ABNORMAL HIGH (ref 0.0–100.0)
RDW: 15.6 % — ABNORMAL HIGH (ref 11.5–14.6)
Sodium: 141 meq/L (ref 135–145)

## 2008-10-11 ENCOUNTER — Ambulatory Visit: Payer: Self-pay | Admitting: Internal Medicine

## 2008-10-11 LAB — CONVERTED CEMR LAB
Basophils Absolute: 0 10*3/uL (ref 0.0–0.1)
CO2: 37 meq/L — ABNORMAL HIGH (ref 19–32)
Chloride: 100 meq/L (ref 96–112)
Creatinine, Ser: 1.7 mg/dL — ABNORMAL HIGH (ref 0.4–1.2)
Eosinophils Absolute: 0.2 10*3/uL (ref 0.0–0.7)
GFR calc non Af Amer: 31 mL/min
Lymphocytes Relative: 10.6 % — ABNORMAL LOW (ref 12.0–46.0)
MCHC: 33.5 g/dL (ref 30.0–36.0)
MCV: 102.8 fL — ABNORMAL HIGH (ref 78.0–100.0)
Neutrophils Relative %: 75.3 % (ref 43.0–77.0)
Platelets: 117 10*3/uL — ABNORMAL LOW (ref 150–400)
Potassium: 4.1 meq/L (ref 3.5–5.1)
Pro B Natriuretic peptide (BNP): 426 pg/mL — ABNORMAL HIGH (ref 0.0–100.0)
Prothrombin Time: 25.6 s — ABNORMAL HIGH (ref 10.9–13.3)
RBC: 2.61 M/uL — ABNORMAL LOW (ref 3.87–5.11)
RDW: 15.2 % — ABNORMAL HIGH (ref 11.5–14.6)
Sodium: 144 meq/L (ref 135–145)
aPTT: 40.4 s — ABNORMAL HIGH (ref 21.7–29.8)

## 2008-10-14 ENCOUNTER — Telehealth (INDEPENDENT_AMBULATORY_CARE_PROVIDER_SITE_OTHER): Payer: Self-pay | Admitting: *Deleted

## 2008-10-25 ENCOUNTER — Ambulatory Visit: Payer: Self-pay | Admitting: Gastroenterology

## 2008-10-25 LAB — CONVERTED CEMR LAB
Basophils Absolute: 0 10*3/uL (ref 0.0–0.1)
Basophils Relative: 0.1 % (ref 0.0–3.0)
Hemoglobin: 8.4 g/dL — ABNORMAL LOW (ref 12.0–15.0)
Lymphocytes Relative: 14.2 % (ref 12.0–46.0)
MCHC: 33.4 g/dL (ref 30.0–36.0)
Monocytes Relative: 10.1 % (ref 3.0–12.0)
Neutro Abs: 4.4 10*3/uL (ref 1.4–7.7)
Neutrophils Relative %: 71.3 % (ref 43.0–77.0)
RBC: 2.42 M/uL — ABNORMAL LOW (ref 3.87–5.11)

## 2008-11-08 ENCOUNTER — Ambulatory Visit: Payer: Self-pay | Admitting: Internal Medicine

## 2008-11-08 LAB — CONVERTED CEMR LAB
BUN: 61 mg/dL — ABNORMAL HIGH (ref 6–23)
Calcium: 9.3 mg/dL (ref 8.4–10.5)
Creatinine, Ser: 1.8 mg/dL — ABNORMAL HIGH (ref 0.4–1.2)
Eosinophils Absolute: 0.2 10*3/uL (ref 0.0–0.7)
Eosinophils Relative: 4.2 % (ref 0.0–5.0)
GFR calc Af Amer: 35 mL/min
GFR calc non Af Amer: 29 mL/min
Glucose, Bld: 99 mg/dL (ref 70–99)
HCT: 26.1 % — ABNORMAL LOW (ref 36.0–46.0)
Hemoglobin: 8.7 g/dL — ABNORMAL LOW (ref 12.0–15.0)
MCV: 103.2 fL — ABNORMAL HIGH (ref 78.0–100.0)
Monocytes Absolute: 0.6 10*3/uL (ref 0.1–1.0)
Monocytes Relative: 9.8 % (ref 3.0–12.0)
Neutro Abs: 4.1 10*3/uL (ref 1.4–7.7)
WBC: 5.7 10*3/uL (ref 4.5–10.5)

## 2008-12-06 ENCOUNTER — Ambulatory Visit: Payer: Self-pay | Admitting: Internal Medicine

## 2008-12-06 ENCOUNTER — Encounter: Payer: Self-pay | Admitting: Internal Medicine

## 2008-12-06 LAB — CONVERTED CEMR LAB
Basophils Absolute: 0 10*3/uL (ref 0.0–0.1)
Basophils Relative: 0.1 % (ref 0.0–3.0)
Chloride: 99 meq/L (ref 96–112)
Creatinine, Ser: 1.6 mg/dL — ABNORMAL HIGH (ref 0.4–1.2)
Eosinophils Absolute: 0.3 10*3/uL (ref 0.0–0.7)
GFR calc non Af Amer: 33 mL/min
MCHC: 34 g/dL (ref 30.0–36.0)
MCV: 102 fL — ABNORMAL HIGH (ref 78.0–100.0)
Neutrophils Relative %: 69 % (ref 43.0–77.0)
Platelets: 146 10*3/uL — ABNORMAL LOW (ref 150–400)
Potassium: 4.1 meq/L (ref 3.5–5.1)
Pro B Natriuretic peptide (BNP): 270 pg/mL — ABNORMAL HIGH (ref 0.0–100.0)
RDW: 15 % — ABNORMAL HIGH (ref 11.5–14.6)
Sodium: 140 meq/L (ref 135–145)

## 2008-12-07 ENCOUNTER — Telehealth (INDEPENDENT_AMBULATORY_CARE_PROVIDER_SITE_OTHER): Payer: Self-pay | Admitting: *Deleted

## 2008-12-21 ENCOUNTER — Ambulatory Visit: Payer: Self-pay | Admitting: Internal Medicine

## 2008-12-31 ENCOUNTER — Ambulatory Visit: Payer: Self-pay | Admitting: Internal Medicine

## 2008-12-31 LAB — CONVERTED CEMR LAB
CO2: 36 meq/L — ABNORMAL HIGH (ref 19–32)
Calcium: 9.5 mg/dL (ref 8.4–10.5)
Glucose, Bld: 90 mg/dL (ref 70–99)
Hemoglobin: 9.7 g/dL — ABNORMAL LOW (ref 12.0–15.0)
Lymphocytes Relative: 14.9 % (ref 12.0–46.0)
Monocytes Relative: 11.2 % (ref 3.0–12.0)
Neutro Abs: 3.4 10*3/uL (ref 1.4–7.7)
Potassium: 3.8 meq/L (ref 3.5–5.1)
RDW: 15.4 % — ABNORMAL HIGH (ref 11.5–14.6)
Sodium: 141 meq/L (ref 135–145)
WBC: 5.2 10*3/uL (ref 4.5–10.5)

## 2009-01-10 ENCOUNTER — Encounter: Payer: Self-pay | Admitting: Internal Medicine

## 2009-01-27 ENCOUNTER — Encounter (HOSPITAL_COMMUNITY): Admission: RE | Admit: 2009-01-27 | Discharge: 2009-04-27 | Payer: Self-pay | Admitting: Nephrology

## 2009-01-28 ENCOUNTER — Ambulatory Visit: Payer: Self-pay | Admitting: Internal Medicine

## 2009-01-28 LAB — CONVERTED CEMR LAB
BUN: 66 mg/dL — ABNORMAL HIGH (ref 6–23)
Basophils Relative: 0 % (ref 0.0–3.0)
Creatinine, Ser: 1.6 mg/dL — ABNORMAL HIGH (ref 0.4–1.2)
Eosinophils Absolute: 0.2 10*3/uL (ref 0.0–0.7)
Eosinophils Relative: 4.3 % (ref 0.0–5.0)
GFR calc Af Amer: 40 mL/min
GFR calc non Af Amer: 33 mL/min
HCT: 27.1 % — ABNORMAL LOW (ref 36.0–46.0)
MCV: 102.7 fL — ABNORMAL HIGH (ref 78.0–100.0)
Monocytes Absolute: 0.7 10*3/uL (ref 0.1–1.0)
RBC: 2.64 M/uL — ABNORMAL LOW (ref 3.87–5.11)
WBC: 5.5 10*3/uL (ref 4.5–10.5)

## 2009-02-07 ENCOUNTER — Ambulatory Visit: Payer: Self-pay | Admitting: Internal Medicine

## 2009-02-07 LAB — CONVERTED CEMR LAB
Basophils Absolute: 0 10*3/uL (ref 0.0–0.1)
CO2: 35 meq/L — ABNORMAL HIGH (ref 19–32)
Calcium: 9.6 mg/dL (ref 8.4–10.5)
GFR calc non Af Amer: 24 mL/min
Hemoglobin: 10.6 g/dL — ABNORMAL LOW (ref 12.0–15.0)
Lymphocytes Relative: 14.2 % (ref 12.0–46.0)
MCHC: 33.6 g/dL (ref 30.0–36.0)
Neutro Abs: 3.5 10*3/uL (ref 1.4–7.7)
Neutrophils Relative %: 66.3 % (ref 43.0–77.0)
RDW: 15.5 % — ABNORMAL HIGH (ref 11.5–14.6)
Sodium: 139 meq/L (ref 135–145)

## 2009-02-11 ENCOUNTER — Encounter: Payer: Self-pay | Admitting: Internal Medicine

## 2009-02-11 ENCOUNTER — Encounter: Admission: RE | Admit: 2009-02-11 | Discharge: 2009-02-11 | Payer: Self-pay | Admitting: Internal Medicine

## 2009-02-14 ENCOUNTER — Ambulatory Visit: Payer: Self-pay | Admitting: Internal Medicine

## 2009-02-14 LAB — CONVERTED CEMR LAB
BUN: 112 mg/dL (ref 6–23)
BUN: 114 mg/dL (ref 6–23)
Basophils Relative: 0.1 % (ref 0.0–3.0)
CO2: 35 meq/L — ABNORMAL HIGH (ref 19–32)
CO2: 35 meq/L — ABNORMAL HIGH (ref 19–32)
Calcium: 9.1 mg/dL (ref 8.4–10.5)
Chloride: 93 meq/L — ABNORMAL LOW (ref 96–112)
Chloride: 94 meq/L — ABNORMAL LOW (ref 96–112)
Creatinine, Ser: 2.1 mg/dL — ABNORMAL HIGH (ref 0.4–1.2)
Creatinine, Ser: 2.2 mg/dL — ABNORMAL HIGH (ref 0.4–1.2)
Eosinophils Absolute: 0.3 10*3/uL (ref 0.0–0.7)
Eosinophils Relative: 5.5 % — ABNORMAL HIGH (ref 0.0–5.0)
Glucose, Bld: 92 mg/dL (ref 70–99)
Glucose, Bld: 66 mg/dL — ABNORMAL LOW (ref 70–99)
Lymphocytes Relative: 14.2 % (ref 12.0–46.0)
MCV: 101.4 fL — ABNORMAL HIGH (ref 78.0–100.0)
Monocytes Relative: 13.9 % — ABNORMAL HIGH (ref 3.0–12.0)
Neutrophils Relative %: 66.3 % (ref 43.0–77.0)
RBC: 3.11 M/uL — ABNORMAL LOW (ref 3.87–5.11)
WBC: 5.4 10*3/uL (ref 4.5–10.5)

## 2009-02-18 ENCOUNTER — Ambulatory Visit: Payer: Self-pay | Admitting: Internal Medicine

## 2009-02-18 LAB — CONVERTED CEMR LAB
BUN: 114 mg/dL (ref 6–23)
CO2: 35 meq/L — ABNORMAL HIGH (ref 19–32)
Calcium: 9.1 mg/dL (ref 8.4–10.5)
GFR calc Af Amer: 28 mL/min
Glucose, Bld: 66 mg/dL — ABNORMAL LOW (ref 70–99)

## 2009-02-21 LAB — CONVERTED CEMR LAB
BUN: 132 mg/dL (ref 6–23)
Chloride: 94 meq/L — ABNORMAL LOW (ref 96–112)
Creatinine, Ser: 2 mg/dL — ABNORMAL HIGH (ref 0.4–1.2)
GFR calc Af Amer: 31 mL/min
GFR calc non Af Amer: 26 mL/min
Glucose, Bld: 82 mg/dL (ref 70–99)

## 2009-02-25 ENCOUNTER — Ambulatory Visit: Payer: Self-pay | Admitting: Internal Medicine

## 2009-02-25 LAB — CONVERTED CEMR LAB
BUN: 60 mg/dL — ABNORMAL HIGH (ref 6–23)
CO2: 34 meq/L — ABNORMAL HIGH (ref 19–32)
Calcium: 9.2 mg/dL (ref 8.4–10.5)
Chloride: 101 meq/L (ref 96–112)
Creatinine, Ser: 1.3 mg/dL — ABNORMAL HIGH (ref 0.4–1.2)

## 2009-03-24 ENCOUNTER — Ambulatory Visit: Payer: Self-pay | Admitting: Internal Medicine

## 2009-04-07 ENCOUNTER — Ambulatory Visit: Payer: Self-pay | Admitting: Internal Medicine

## 2009-04-21 ENCOUNTER — Ambulatory Visit: Payer: Self-pay | Admitting: Internal Medicine

## 2009-04-22 ENCOUNTER — Encounter: Payer: Self-pay | Admitting: Internal Medicine

## 2009-05-05 ENCOUNTER — Telehealth (INDEPENDENT_AMBULATORY_CARE_PROVIDER_SITE_OTHER): Payer: Self-pay | Admitting: *Deleted

## 2009-05-06 ENCOUNTER — Encounter (HOSPITAL_COMMUNITY): Admission: RE | Admit: 2009-05-06 | Discharge: 2009-08-04 | Payer: Self-pay | Admitting: Nephrology

## 2009-05-20 ENCOUNTER — Encounter: Payer: Self-pay | Admitting: Internal Medicine

## 2009-05-25 ENCOUNTER — Ambulatory Visit: Payer: Self-pay | Admitting: Cardiology

## 2009-05-25 ENCOUNTER — Encounter: Payer: Self-pay | Admitting: Internal Medicine

## 2009-05-25 ENCOUNTER — Encounter: Payer: Self-pay | Admitting: Physician Assistant

## 2009-05-25 ENCOUNTER — Ambulatory Visit: Payer: Self-pay | Admitting: Internal Medicine

## 2009-05-25 DIAGNOSIS — R0989 Other specified symptoms and signs involving the circulatory and respiratory systems: Secondary | ICD-10-CM

## 2009-05-26 ENCOUNTER — Encounter: Payer: Self-pay | Admitting: Internal Medicine

## 2009-05-27 ENCOUNTER — Telehealth (INDEPENDENT_AMBULATORY_CARE_PROVIDER_SITE_OTHER): Payer: Self-pay | Admitting: *Deleted

## 2009-05-30 ENCOUNTER — Ambulatory Visit: Payer: Self-pay | Admitting: Internal Medicine

## 2009-05-30 ENCOUNTER — Encounter: Payer: Self-pay | Admitting: Internal Medicine

## 2009-05-30 LAB — CONVERTED CEMR LAB: Protime: 25.3

## 2009-06-01 ENCOUNTER — Encounter: Payer: Self-pay | Admitting: Internal Medicine

## 2009-06-02 ENCOUNTER — Ambulatory Visit: Payer: Self-pay | Admitting: Internal Medicine

## 2009-06-03 LAB — CONVERTED CEMR LAB
BUN: 66 mg/dL — ABNORMAL HIGH (ref 6–23)
Chloride: 99 meq/L (ref 96–112)
Creatinine, Ser: 1.5 mg/dL — ABNORMAL HIGH (ref 0.4–1.2)
Glucose, Bld: 88 mg/dL (ref 70–99)
Potassium: 4.1 meq/L (ref 3.5–5.1)

## 2009-06-06 ENCOUNTER — Ambulatory Visit: Payer: Self-pay | Admitting: Cardiology

## 2009-06-06 LAB — CONVERTED CEMR LAB: POC INR: 1.8

## 2009-06-16 ENCOUNTER — Ambulatory Visit: Payer: Self-pay | Admitting: Internal Medicine

## 2009-06-16 LAB — CONVERTED CEMR LAB: Prothrombin Time: 15.6 s

## 2009-06-22 ENCOUNTER — Ambulatory Visit: Payer: Self-pay | Admitting: Internal Medicine

## 2009-06-22 ENCOUNTER — Encounter: Payer: Self-pay | Admitting: Internal Medicine

## 2009-06-22 LAB — CONVERTED CEMR LAB
POC INR: 1.7
Prothrombin Time: 16.2 s

## 2009-06-23 ENCOUNTER — Encounter: Payer: Self-pay | Admitting: Internal Medicine

## 2009-06-29 ENCOUNTER — Ambulatory Visit: Payer: Self-pay | Admitting: Internal Medicine

## 2009-06-29 LAB — CONVERTED CEMR LAB
POC INR: 1.6
Prothrombin Time: 15.4 s

## 2009-07-06 ENCOUNTER — Ambulatory Visit: Payer: Self-pay | Admitting: Cardiology

## 2009-07-06 LAB — CONVERTED CEMR LAB: Prothrombin Time: 16.4 s

## 2009-07-13 ENCOUNTER — Ambulatory Visit: Payer: Self-pay | Admitting: Cardiology

## 2009-07-13 ENCOUNTER — Encounter (INDEPENDENT_AMBULATORY_CARE_PROVIDER_SITE_OTHER): Payer: Self-pay | Admitting: Cardiology

## 2009-07-22 ENCOUNTER — Ambulatory Visit: Payer: Self-pay | Admitting: Internal Medicine

## 2009-07-22 DIAGNOSIS — E785 Hyperlipidemia, unspecified: Secondary | ICD-10-CM

## 2009-07-22 LAB — CONVERTED CEMR LAB
BUN: 66 mg/dL — ABNORMAL HIGH (ref 6–23)
CO2: 33 meq/L — ABNORMAL HIGH (ref 19–32)
Chloride: 99 meq/L (ref 96–112)
GFR calc non Af Amer: 35.66 mL/min (ref >60)
Glucose, Bld: 93 mg/dL (ref 70–99)
Potassium: 4.2 meq/L (ref 3.5–5.1)
Sodium: 141 meq/L (ref 135–145)

## 2009-08-05 ENCOUNTER — Ambulatory Visit: Payer: Self-pay | Admitting: Cardiovascular Disease

## 2009-08-05 ENCOUNTER — Encounter (INDEPENDENT_AMBULATORY_CARE_PROVIDER_SITE_OTHER): Payer: Self-pay | Admitting: Cardiology

## 2009-08-08 ENCOUNTER — Encounter (HOSPITAL_COMMUNITY): Admission: RE | Admit: 2009-08-08 | Discharge: 2009-11-06 | Payer: Self-pay | Admitting: Nephrology

## 2009-08-17 ENCOUNTER — Ambulatory Visit: Payer: Self-pay | Admitting: Internal Medicine

## 2009-08-17 LAB — CONVERTED CEMR LAB: POC INR: 3

## 2009-08-31 ENCOUNTER — Encounter: Payer: Self-pay | Admitting: Internal Medicine

## 2009-09-07 ENCOUNTER — Encounter: Payer: Self-pay | Admitting: Internal Medicine

## 2009-09-07 ENCOUNTER — Ambulatory Visit: Payer: Self-pay | Admitting: Cardiovascular Disease

## 2009-09-07 LAB — CONVERTED CEMR LAB: POC INR: 3.3

## 2009-09-08 ENCOUNTER — Encounter: Payer: Self-pay | Admitting: Internal Medicine

## 2009-09-21 ENCOUNTER — Ambulatory Visit: Payer: Self-pay | Admitting: Internal Medicine

## 2009-09-21 LAB — CONVERTED CEMR LAB: POC INR: 2.3

## 2009-09-27 ENCOUNTER — Telehealth: Payer: Self-pay | Admitting: Internal Medicine

## 2009-10-12 ENCOUNTER — Telehealth (INDEPENDENT_AMBULATORY_CARE_PROVIDER_SITE_OTHER): Payer: Self-pay | Admitting: *Deleted

## 2009-10-12 ENCOUNTER — Ambulatory Visit: Payer: Self-pay | Admitting: Cardiovascular Disease

## 2009-10-12 LAB — CONVERTED CEMR LAB: POC INR: 1.8

## 2009-10-26 ENCOUNTER — Ambulatory Visit: Payer: Self-pay | Admitting: Cardiovascular Disease

## 2009-10-26 LAB — CONVERTED CEMR LAB: POC INR: 2.2

## 2009-10-31 ENCOUNTER — Ambulatory Visit: Payer: Self-pay | Admitting: Internal Medicine

## 2009-10-31 DIAGNOSIS — N184 Chronic kidney disease, stage 4 (severe): Secondary | ICD-10-CM | POA: Insufficient documentation

## 2009-11-08 LAB — CONVERTED CEMR LAB
Basophils Absolute: 0 10*3/uL (ref 0.0–0.1)
HCT: 34.2 % — ABNORMAL LOW (ref 36.0–46.0)
Iron: 123 ug/dL (ref 42–145)
Lymphs Abs: 0.7 10*3/uL (ref 0.7–4.0)
Monocytes Absolute: 0.3 10*3/uL (ref 0.1–1.0)
Monocytes Relative: 5.4 % (ref 3.0–12.0)
Neutrophils Relative %: 78.2 % — ABNORMAL HIGH (ref 43.0–77.0)
Platelets: 161 10*3/uL (ref 150.0–400.0)
RDW: 15.9 % — ABNORMAL HIGH (ref 11.5–14.6)
TSH: 1.41 microintl units/mL (ref 0.35–5.50)
Transferrin: 223.6 mg/dL (ref 212.0–360.0)
WBC: 5.3 10*3/uL (ref 4.5–10.5)

## 2009-11-14 ENCOUNTER — Encounter (HOSPITAL_COMMUNITY): Admission: RE | Admit: 2009-11-14 | Discharge: 2010-02-12 | Payer: Self-pay | Admitting: Nephrology

## 2009-11-14 ENCOUNTER — Ambulatory Visit: Payer: Self-pay | Admitting: Cardiology

## 2009-11-21 ENCOUNTER — Ambulatory Visit: Payer: Self-pay | Admitting: Internal Medicine

## 2009-11-21 LAB — CONVERTED CEMR LAB
Calcium: 9.2 mg/dL (ref 8.4–10.5)
Chloride: 102 meq/L (ref 96–112)
GFR calc non Af Amer: 30.84 mL/min (ref >60)
Potassium: 3.6 meq/L (ref 3.5–5.1)
Pro B Natriuretic peptide (BNP): 236 pg/mL — ABNORMAL HIGH (ref 0.0–100.0)

## 2009-12-12 ENCOUNTER — Ambulatory Visit: Payer: Self-pay | Admitting: Cardiology

## 2009-12-12 LAB — CONVERTED CEMR LAB: POC INR: 1.8

## 2009-12-13 ENCOUNTER — Telehealth (INDEPENDENT_AMBULATORY_CARE_PROVIDER_SITE_OTHER): Payer: Self-pay | Admitting: *Deleted

## 2010-01-02 ENCOUNTER — Ambulatory Visit: Payer: Self-pay | Admitting: Cardiology

## 2010-01-02 LAB — CONVERTED CEMR LAB: POC INR: 2.5

## 2010-01-24 ENCOUNTER — Ambulatory Visit: Payer: Self-pay | Admitting: Internal Medicine

## 2010-01-30 ENCOUNTER — Ambulatory Visit: Payer: Self-pay | Admitting: Internal Medicine

## 2010-02-21 ENCOUNTER — Encounter: Payer: Self-pay | Admitting: Internal Medicine

## 2010-03-02 ENCOUNTER — Encounter: Payer: Self-pay | Admitting: Internal Medicine

## 2010-03-03 ENCOUNTER — Ambulatory Visit: Payer: Self-pay | Admitting: Internal Medicine

## 2010-03-03 LAB — CONVERTED CEMR LAB: POC INR: 2.6

## 2010-03-06 ENCOUNTER — Encounter (HOSPITAL_COMMUNITY): Admission: RE | Admit: 2010-03-06 | Discharge: 2010-06-04 | Payer: Self-pay | Admitting: Nephrology

## 2010-03-09 LAB — CONVERTED CEMR LAB
BUN: 88 mg/dL (ref 6–23)
CO2: 35 meq/L — ABNORMAL HIGH (ref 19–32)
Calcium: 9.4 mg/dL (ref 8.4–10.5)
Chloride: 99 meq/L (ref 96–112)
Creatinine, Ser: 1.6 mg/dL — ABNORMAL HIGH (ref 0.4–1.2)
Eosinophils Absolute: 0.2 10*3/uL (ref 0.0–0.7)
Glucose, Bld: 91 mg/dL (ref 70–99)
HCT: 29.6 % — ABNORMAL LOW (ref 36.0–46.0)
Lymphs Abs: 0.7 10*3/uL (ref 0.7–4.0)
MCHC: 32.6 g/dL (ref 30.0–36.0)
MCV: 102.8 fL — ABNORMAL HIGH (ref 78.0–100.0)
Monocytes Absolute: 0.5 10*3/uL (ref 0.1–1.0)
Neutrophils Relative %: 71.7 % (ref 43.0–77.0)
Platelets: 122 10*3/uL — ABNORMAL LOW (ref 150.0–400.0)
RDW: 14.1 % (ref 11.5–14.6)
Uric Acid, Serum: 6.5 mg/dL (ref 2.4–7.0)

## 2010-03-22 ENCOUNTER — Encounter: Admission: RE | Admit: 2010-03-22 | Discharge: 2010-03-22 | Payer: Self-pay | Admitting: Internal Medicine

## 2010-03-27 ENCOUNTER — Ambulatory Visit: Payer: Self-pay | Admitting: Internal Medicine

## 2010-03-30 ENCOUNTER — Ambulatory Visit: Payer: Self-pay | Admitting: Internal Medicine

## 2010-03-30 ENCOUNTER — Ambulatory Visit: Payer: Self-pay | Admitting: Cardiology

## 2010-03-30 LAB — CONVERTED CEMR LAB: BUN: 76 mg/dL — ABNORMAL HIGH (ref 6–23)

## 2010-04-03 LAB — CONVERTED CEMR LAB
BUN: 88 mg/dL (ref 6–23)
Basophils Absolute: 0 10*3/uL (ref 0.0–0.1)
Basophils Relative: 0.3 % (ref 0.0–3.0)
Chloride: 98 meq/L (ref 96–112)
Creatinine, Ser: 1.6 mg/dL — ABNORMAL HIGH (ref 0.4–1.2)
Eosinophils Absolute: 0.2 10*3/uL (ref 0.0–0.7)
Glucose, Bld: 92 mg/dL (ref 70–99)
HCT: 31 % — ABNORMAL LOW (ref 36.0–46.0)
Hemoglobin: 10.4 g/dL — ABNORMAL LOW (ref 12.0–15.0)
Lymphocytes Relative: 12.3 % (ref 12.0–46.0)
Lymphs Abs: 0.6 10*3/uL — ABNORMAL LOW (ref 0.7–4.0)
MCHC: 33.5 g/dL (ref 30.0–36.0)
Monocytes Relative: 11 % (ref 3.0–12.0)
Neutro Abs: 3.5 10*3/uL (ref 1.4–7.7)
Potassium: 3.8 meq/L (ref 3.5–5.1)
RBC: 3.12 M/uL — ABNORMAL LOW (ref 3.87–5.11)
RDW: 15.6 % — ABNORMAL HIGH (ref 11.5–14.6)

## 2010-04-07 ENCOUNTER — Encounter: Payer: Self-pay | Admitting: Internal Medicine

## 2010-04-27 ENCOUNTER — Ambulatory Visit: Payer: Self-pay | Admitting: Internal Medicine

## 2010-04-27 LAB — CONVERTED CEMR LAB: POC INR: 2.1

## 2010-05-26 ENCOUNTER — Encounter (INDEPENDENT_AMBULATORY_CARE_PROVIDER_SITE_OTHER): Payer: Self-pay | Admitting: *Deleted

## 2010-05-30 ENCOUNTER — Ambulatory Visit: Payer: Self-pay | Admitting: Cardiology

## 2010-05-30 ENCOUNTER — Ambulatory Visit: Payer: Self-pay | Admitting: Internal Medicine

## 2010-05-30 LAB — CONVERTED CEMR LAB
BUN: 77 mg/dL — ABNORMAL HIGH (ref 6–23)
Basophils Absolute: 0 10*3/uL (ref 0.0–0.1)
CO2: 31 meq/L (ref 19–32)
Chloride: 101 meq/L (ref 96–112)
Creatinine, Ser: 1.4 mg/dL — ABNORMAL HIGH (ref 0.4–1.2)
Eosinophils Absolute: 0.3 10*3/uL (ref 0.0–0.7)
Eosinophils Relative: 5.7 % — ABNORMAL HIGH (ref 0.0–5.0)
Glucose, Bld: 93 mg/dL (ref 70–99)
HCT: 29.3 % — ABNORMAL LOW (ref 36.0–46.0)
Lymphs Abs: 0.6 10*3/uL — ABNORMAL LOW (ref 0.7–4.0)
MCHC: 34 g/dL (ref 30.0–36.0)
MCV: 99.1 fL (ref 78.0–100.0)
Monocytes Absolute: 0.7 10*3/uL (ref 0.1–1.0)
Neutrophils Relative %: 67.8 % (ref 43.0–77.0)
Platelets: 170 10*3/uL (ref 150.0–400.0)
Potassium: 3.2 meq/L — ABNORMAL LOW (ref 3.5–5.1)
Pro B Natriuretic peptide (BNP): 217.1 pg/mL — ABNORMAL HIGH (ref 0.0–100.0)
RDW: 17.5 % — ABNORMAL HIGH (ref 11.5–14.6)

## 2010-05-31 ENCOUNTER — Encounter: Payer: Self-pay | Admitting: Internal Medicine

## 2010-06-13 ENCOUNTER — Encounter (HOSPITAL_COMMUNITY): Admission: RE | Admit: 2010-06-13 | Discharge: 2010-09-11 | Payer: Self-pay | Admitting: Nephrology

## 2010-06-27 ENCOUNTER — Encounter: Payer: Self-pay | Admitting: Internal Medicine

## 2010-06-27 ENCOUNTER — Ambulatory Visit: Payer: Self-pay | Admitting: Internal Medicine

## 2010-06-28 ENCOUNTER — Ambulatory Visit: Payer: Self-pay

## 2010-06-28 ENCOUNTER — Encounter: Payer: Self-pay | Admitting: Internal Medicine

## 2010-06-28 ENCOUNTER — Ambulatory Visit: Payer: Self-pay | Admitting: Cardiology

## 2010-06-28 LAB — CONVERTED CEMR LAB: POC INR: 2.3

## 2010-07-21 ENCOUNTER — Encounter: Payer: Self-pay | Admitting: Internal Medicine

## 2010-07-26 ENCOUNTER — Ambulatory Visit: Payer: Self-pay | Admitting: Cardiology

## 2010-07-27 ENCOUNTER — Telehealth: Payer: Self-pay | Admitting: Internal Medicine

## 2010-08-07 ENCOUNTER — Ambulatory Visit: Payer: Self-pay | Admitting: Internal Medicine

## 2010-08-15 LAB — CONVERTED CEMR LAB
BUN: 97 mg/dL (ref 6–23)
Creatinine, Ser: 1.7 mg/dL — ABNORMAL HIGH (ref 0.4–1.2)
GFR calc non Af Amer: 30.79 mL/min (ref >60)
Glucose, Bld: 90 mg/dL (ref 70–99)
Potassium: 3.5 meq/L (ref 3.5–5.1)

## 2010-08-17 ENCOUNTER — Telehealth: Payer: Self-pay | Admitting: Internal Medicine

## 2010-08-20 ENCOUNTER — Inpatient Hospital Stay (HOSPITAL_COMMUNITY): Admission: EM | Admit: 2010-08-20 | Discharge: 2010-08-25 | Payer: Self-pay | Admitting: Emergency Medicine

## 2010-08-20 ENCOUNTER — Ambulatory Visit: Payer: Self-pay | Admitting: Internal Medicine

## 2010-08-21 ENCOUNTER — Encounter: Payer: Self-pay | Admitting: Gastroenterology

## 2010-09-04 ENCOUNTER — Ambulatory Visit: Payer: Self-pay | Admitting: Internal Medicine

## 2010-09-04 DIAGNOSIS — K922 Gastrointestinal hemorrhage, unspecified: Secondary | ICD-10-CM | POA: Insufficient documentation

## 2010-09-04 DIAGNOSIS — R609 Edema, unspecified: Secondary | ICD-10-CM

## 2010-09-08 ENCOUNTER — Telehealth (INDEPENDENT_AMBULATORY_CARE_PROVIDER_SITE_OTHER): Payer: Self-pay | Admitting: *Deleted

## 2010-09-11 ENCOUNTER — Telehealth: Payer: Self-pay | Admitting: Internal Medicine

## 2010-09-11 ENCOUNTER — Encounter: Payer: Self-pay | Admitting: Internal Medicine

## 2010-09-12 ENCOUNTER — Ambulatory Visit: Payer: Self-pay | Admitting: Internal Medicine

## 2010-09-13 ENCOUNTER — Telehealth: Payer: Self-pay | Admitting: Gastroenterology

## 2010-09-13 ENCOUNTER — Telehealth (INDEPENDENT_AMBULATORY_CARE_PROVIDER_SITE_OTHER): Payer: Self-pay | Admitting: *Deleted

## 2010-09-18 ENCOUNTER — Inpatient Hospital Stay (HOSPITAL_COMMUNITY): Admission: EM | Admit: 2010-09-18 | Discharge: 2010-09-22 | Payer: Self-pay | Admitting: Emergency Medicine

## 2010-09-18 ENCOUNTER — Telehealth (INDEPENDENT_AMBULATORY_CARE_PROVIDER_SITE_OTHER): Payer: Self-pay | Admitting: *Deleted

## 2010-09-19 ENCOUNTER — Ambulatory Visit: Payer: Self-pay | Admitting: Vascular Surgery

## 2010-09-19 ENCOUNTER — Encounter (INDEPENDENT_AMBULATORY_CARE_PROVIDER_SITE_OTHER): Payer: Self-pay | Admitting: Internal Medicine

## 2010-09-25 ENCOUNTER — Telehealth: Payer: Self-pay | Admitting: Internal Medicine

## 2010-09-25 ENCOUNTER — Ambulatory Visit: Payer: Self-pay | Admitting: Gastroenterology

## 2010-09-25 DIAGNOSIS — K259 Gastric ulcer, unspecified as acute or chronic, without hemorrhage or perforation: Secondary | ICD-10-CM | POA: Insufficient documentation

## 2010-09-26 ENCOUNTER — Ambulatory Visit: Payer: Self-pay | Admitting: Internal Medicine

## 2010-09-29 ENCOUNTER — Ambulatory Visit: Payer: Self-pay | Admitting: Internal Medicine

## 2010-10-02 ENCOUNTER — Encounter: Payer: Self-pay | Admitting: Internal Medicine

## 2010-10-02 ENCOUNTER — Encounter (HOSPITAL_COMMUNITY)
Admission: RE | Admit: 2010-10-02 | Discharge: 2010-12-31 | Payer: Self-pay | Source: Home / Self Care | Attending: Nephrology | Admitting: Nephrology

## 2010-10-03 ENCOUNTER — Telehealth: Payer: Self-pay | Admitting: Internal Medicine

## 2010-10-06 ENCOUNTER — Ambulatory Visit: Payer: Self-pay | Admitting: Cardiovascular Disease

## 2010-10-06 ENCOUNTER — Encounter (INDEPENDENT_AMBULATORY_CARE_PROVIDER_SITE_OTHER): Payer: Self-pay | Admitting: *Deleted

## 2010-10-06 ENCOUNTER — Ambulatory Visit: Payer: Self-pay | Admitting: Internal Medicine

## 2010-10-06 ENCOUNTER — Encounter: Payer: Self-pay | Admitting: Internal Medicine

## 2010-10-06 LAB — CONVERTED CEMR LAB
BUN: 80 mg/dL — ABNORMAL HIGH (ref 6–23)
Calcium: 8.9 mg/dL (ref 8.4–10.5)
Chloride: 95 meq/L — ABNORMAL LOW (ref 96–112)
Creatinine, Ser: 1.8 mg/dL — ABNORMAL HIGH (ref 0.4–1.2)

## 2010-10-12 ENCOUNTER — Encounter: Payer: Self-pay | Admitting: Internal Medicine

## 2010-10-12 ENCOUNTER — Ambulatory Visit: Payer: Self-pay | Admitting: Internal Medicine

## 2010-10-13 ENCOUNTER — Encounter: Payer: Self-pay | Admitting: Internal Medicine

## 2010-10-16 ENCOUNTER — Telehealth: Payer: Self-pay | Admitting: Gastroenterology

## 2010-10-16 ENCOUNTER — Encounter: Payer: Self-pay | Admitting: Gastroenterology

## 2010-10-16 ENCOUNTER — Telehealth (INDEPENDENT_AMBULATORY_CARE_PROVIDER_SITE_OTHER): Payer: Self-pay

## 2010-10-18 ENCOUNTER — Ambulatory Visit: Payer: Self-pay | Admitting: Gastroenterology

## 2010-10-18 ENCOUNTER — Ambulatory Visit: Payer: Self-pay | Admitting: Cardiovascular Disease

## 2010-10-18 LAB — CONVERTED CEMR LAB
Eosinophils Absolute: 0.1 10*3/uL (ref 0.0–0.7)
MCHC: 33.7 g/dL (ref 30.0–36.0)
MCV: 99 fL (ref 78.0–100.0)
Monocytes Absolute: 0.5 10*3/uL (ref 0.1–1.0)
Neutrophils Relative %: 72.8 % (ref 43.0–77.0)
Platelets: 194 10*3/uL (ref 150.0–400.0)
WBC: 5.1 10*3/uL (ref 4.5–10.5)

## 2010-10-19 ENCOUNTER — Encounter (HOSPITAL_COMMUNITY)
Admission: RE | Admit: 2010-10-19 | Discharge: 2010-12-08 | Payer: Self-pay | Source: Home / Self Care | Attending: Gastroenterology | Admitting: Gastroenterology

## 2010-10-19 ENCOUNTER — Ambulatory Visit: Payer: Self-pay | Admitting: Gastroenterology

## 2010-10-19 ENCOUNTER — Inpatient Hospital Stay (HOSPITAL_COMMUNITY): Admission: EM | Admit: 2010-10-19 | Discharge: 2010-10-22 | Payer: Self-pay | Admitting: Emergency Medicine

## 2010-10-19 ENCOUNTER — Ambulatory Visit: Payer: Self-pay | Admitting: Cardiology

## 2010-10-19 ENCOUNTER — Telehealth (INDEPENDENT_AMBULATORY_CARE_PROVIDER_SITE_OTHER): Payer: Self-pay

## 2010-10-20 ENCOUNTER — Encounter: Payer: Self-pay | Admitting: Gastroenterology

## 2010-10-20 ENCOUNTER — Encounter (INDEPENDENT_AMBULATORY_CARE_PROVIDER_SITE_OTHER): Payer: Self-pay | Admitting: Internal Medicine

## 2010-10-21 ENCOUNTER — Encounter (INDEPENDENT_AMBULATORY_CARE_PROVIDER_SITE_OTHER): Payer: Self-pay | Admitting: *Deleted

## 2010-10-21 ENCOUNTER — Encounter (INDEPENDENT_AMBULATORY_CARE_PROVIDER_SITE_OTHER): Payer: Self-pay | Admitting: Internal Medicine

## 2010-10-22 ENCOUNTER — Encounter: Payer: Self-pay | Admitting: Gastroenterology

## 2010-10-23 ENCOUNTER — Encounter: Payer: Self-pay | Admitting: Cardiology

## 2010-10-23 ENCOUNTER — Encounter: Payer: Self-pay | Admitting: Gastroenterology

## 2010-10-23 LAB — CONVERTED CEMR LAB
BUN: 74 mg/dL — ABNORMAL HIGH (ref 6–23)
Calcium: 9 mg/dL (ref 8.4–10.5)
GFR calc non Af Amer: 24.52 mL/min (ref >60)
Glucose, Bld: 81 mg/dL (ref 70–99)
Pro B Natriuretic peptide (BNP): 521.9 pg/mL — ABNORMAL HIGH (ref 0.0–100.0)
Sodium: 141 meq/L (ref 135–145)

## 2010-10-25 ENCOUNTER — Ambulatory Visit: Payer: Self-pay | Admitting: Internal Medicine

## 2010-10-31 ENCOUNTER — Encounter: Payer: Self-pay | Admitting: Internal Medicine

## 2010-11-06 ENCOUNTER — Telehealth: Payer: Self-pay | Admitting: Gastroenterology

## 2010-11-07 ENCOUNTER — Encounter: Payer: Self-pay | Admitting: Physician Assistant

## 2010-11-09 ENCOUNTER — Telehealth (INDEPENDENT_AMBULATORY_CARE_PROVIDER_SITE_OTHER): Payer: Self-pay | Admitting: *Deleted

## 2010-11-13 ENCOUNTER — Ambulatory Visit: Payer: Self-pay | Admitting: Gastroenterology

## 2010-11-14 ENCOUNTER — Telehealth: Payer: Self-pay | Admitting: Internal Medicine

## 2010-11-14 ENCOUNTER — Ambulatory Visit: Payer: Self-pay | Admitting: Internal Medicine

## 2010-11-15 ENCOUNTER — Telehealth: Payer: Self-pay | Admitting: Internal Medicine

## 2010-11-16 ENCOUNTER — Emergency Department (HOSPITAL_COMMUNITY): Admission: EM | Admit: 2010-11-16 | Discharge: 2010-08-17 | Payer: Self-pay | Admitting: Emergency Medicine

## 2010-11-17 ENCOUNTER — Encounter: Payer: Self-pay | Admitting: Internal Medicine

## 2010-11-20 ENCOUNTER — Encounter: Payer: Self-pay | Admitting: Internal Medicine

## 2010-11-21 ENCOUNTER — Encounter: Payer: Self-pay | Admitting: Internal Medicine

## 2010-11-21 ENCOUNTER — Telehealth: Payer: Self-pay | Admitting: Gastroenterology

## 2010-11-21 ENCOUNTER — Encounter: Payer: Self-pay | Admitting: Gastroenterology

## 2010-11-22 ENCOUNTER — Ambulatory Visit: Payer: Self-pay | Admitting: Gastroenterology

## 2010-11-23 ENCOUNTER — Encounter: Payer: Self-pay | Admitting: Gastroenterology

## 2010-11-23 ENCOUNTER — Ambulatory Visit: Payer: Self-pay | Admitting: Internal Medicine

## 2010-11-27 ENCOUNTER — Telehealth: Payer: Self-pay | Admitting: Gastroenterology

## 2010-11-27 ENCOUNTER — Ambulatory Visit: Payer: Self-pay | Admitting: Gastroenterology

## 2010-11-27 DIAGNOSIS — R1084 Generalized abdominal pain: Secondary | ICD-10-CM | POA: Insufficient documentation

## 2010-11-27 DIAGNOSIS — R112 Nausea with vomiting, unspecified: Secondary | ICD-10-CM | POA: Insufficient documentation

## 2010-11-29 ENCOUNTER — Ambulatory Visit (HOSPITAL_COMMUNITY)
Admission: RE | Admit: 2010-11-29 | Discharge: 2010-11-29 | Payer: Self-pay | Source: Home / Self Care | Attending: Gastroenterology | Admitting: Gastroenterology

## 2010-11-29 LAB — CONVERTED CEMR LAB
ALT: 13 units/L (ref 0–35)
AST: 23 units/L (ref 0–37)
Albumin: 4.3 g/dL (ref 3.5–5.2)
Amylase: 89 units/L (ref 27–131)
Total Bilirubin: 1 mg/dL (ref 0.3–1.2)

## 2010-12-12 ENCOUNTER — Telehealth: Payer: Self-pay | Admitting: Internal Medicine

## 2010-12-15 ENCOUNTER — Ambulatory Visit (HOSPITAL_COMMUNITY)
Admission: RE | Admit: 2010-12-15 | Discharge: 2010-12-15 | Payer: Self-pay | Source: Home / Self Care | Attending: Gastroenterology | Admitting: Gastroenterology

## 2010-12-20 ENCOUNTER — Encounter: Payer: Self-pay | Admitting: Internal Medicine

## 2010-12-22 ENCOUNTER — Other Ambulatory Visit: Payer: Self-pay | Admitting: Internal Medicine

## 2010-12-22 ENCOUNTER — Ambulatory Visit
Admission: RE | Admit: 2010-12-22 | Discharge: 2010-12-22 | Payer: Self-pay | Source: Home / Self Care | Attending: Internal Medicine | Admitting: Internal Medicine

## 2010-12-22 ENCOUNTER — Encounter: Payer: Self-pay | Admitting: Internal Medicine

## 2010-12-22 LAB — CBC WITH DIFFERENTIAL/PLATELET
Basophils Absolute: 0.1 10*3/uL (ref 0.0–0.1)
Basophils Relative: 1 % (ref 0.0–3.0)
Eosinophils Absolute: 0.2 10*3/uL (ref 0.0–0.7)
Eosinophils Relative: 2.4 % (ref 0.0–5.0)
HCT: 33.8 % — ABNORMAL LOW (ref 36.0–46.0)
Hemoglobin: 10.9 g/dL — ABNORMAL LOW (ref 12.0–15.0)
Lymphocytes Relative: 13 % (ref 12.0–46.0)
Lymphs Abs: 0.8 10*3/uL (ref 0.7–4.0)
MCHC: 32.1 g/dL (ref 30.0–36.0)
MCV: 102.3 fl — ABNORMAL HIGH (ref 78.0–100.0)
Monocytes Absolute: 0.4 10*3/uL (ref 0.1–1.0)
Monocytes Relative: 6.8 % (ref 3.0–12.0)
Neutro Abs: 4.8 10*3/uL (ref 1.4–7.7)
Neutrophils Relative %: 76.8 % (ref 43.0–77.0)
Platelets: 124 10*3/uL — ABNORMAL LOW (ref 150.0–400.0)
RBC: 3.3 Mil/uL — ABNORMAL LOW (ref 3.87–5.11)
RDW: 19.2 % — ABNORMAL HIGH (ref 11.5–14.6)
WBC: 6.3 10*3/uL (ref 4.5–10.5)

## 2010-12-22 LAB — BRAIN NATRIURETIC PEPTIDE: Pro B Natriuretic peptide (BNP): 256.9 pg/mL — ABNORMAL HIGH (ref 0.0–100.0)

## 2010-12-22 LAB — BASIC METABOLIC PANEL
BUN: 77 mg/dL — ABNORMAL HIGH (ref 6–23)
CO2: 37 mEq/L — ABNORMAL HIGH (ref 19–32)
Calcium: 9.8 mg/dL (ref 8.4–10.5)
Chloride: 95 mEq/L — ABNORMAL LOW (ref 96–112)
Creatinine, Ser: 2 mg/dL — ABNORMAL HIGH (ref 0.4–1.2)
GFR: 26.1 mL/min — ABNORMAL LOW (ref >60.00)
Glucose, Bld: 102 mg/dL — ABNORMAL HIGH (ref 70–99)
Potassium: 4.2 mEq/L (ref 3.5–5.1)
Sodium: 142 mEq/L (ref 135–145)

## 2010-12-25 ENCOUNTER — Telehealth: Payer: Self-pay | Admitting: Internal Medicine

## 2010-12-26 ENCOUNTER — Other Ambulatory Visit: Payer: Self-pay | Admitting: Gastroenterology

## 2010-12-26 ENCOUNTER — Ambulatory Visit
Admission: RE | Admit: 2010-12-26 | Discharge: 2010-12-26 | Payer: Self-pay | Source: Home / Self Care | Attending: Gastroenterology | Admitting: Gastroenterology

## 2010-12-27 ENCOUNTER — Ambulatory Visit: Admit: 2010-12-27 | Payer: Self-pay | Admitting: Internal Medicine

## 2010-12-27 LAB — IRON AND TIBC
Iron: 118 ug/dL (ref 42–135)
Saturation Ratios: 40 % (ref 20–55)
TIBC: 294 ug/dL (ref 250–470)
UIBC: 176 ug/dL

## 2010-12-27 LAB — POCT HEMOGLOBIN-HEMACUE: Hemoglobin: 10.9 g/dL — ABNORMAL LOW (ref 12.0–15.0)

## 2011-01-02 ENCOUNTER — Encounter: Payer: Self-pay | Admitting: Gastroenterology

## 2011-01-05 ENCOUNTER — Telehealth: Payer: Self-pay | Admitting: Internal Medicine

## 2011-01-08 ENCOUNTER — Encounter (HOSPITAL_COMMUNITY)
Admission: RE | Admit: 2011-01-08 | Discharge: 2011-01-09 | Payer: Self-pay | Source: Home / Self Care | Attending: Nephrology | Admitting: Nephrology

## 2011-01-09 LAB — POCT HEMOGLOBIN-HEMACUE: Hemoglobin: 12.2 g/dL (ref 12.0–15.0)

## 2011-01-09 NOTE — Medication Information (Signed)
Summary: rov/tm  Anticoagulant Therapy  Managed by: Bethena Midget, RN, BSN Referring MD: Dietrich Pates, MD PCP: Oren Bracket Supervising MD: Antoine Poche MD, Fayrene Fearing Indication 1: Atrial Fibrillation Lab Used: [facility] [place] Site: [address] INR POC 2.5 INR RANGE 2-3  Dietary changes: no    Health status changes: no    Bleeding/hemorrhagic complications: no    Recent/future hospitalizations: no    Any changes in medication regimen? no    Recent/future dental: no  Any missed doses?: no       Is patient compliant with meds? yes       Allergies: No Known Drug Allergies  Anticoagulation Management History:      The patient is taking warfarin and comes in today for a routine follow up visit.  Positive risk factors for bleeding include an age of 75 years or older, history of CVA/TIA, history of GI bleeding, and presence of serious comorbidities.  The bleeding index is 'high risk'.  Positive CHADS2 values include History of CHF, History of HTN, Age > 75 years old, and Prior Stroke/CVA/TIA.  Her last INR was 2.4 RATIO.  Anticoagulation responsible provider: Antoine Poche MD, Fayrene Fearing.  INR POC: 2.5.  Cuvette Lot#: [id number].  Exp: 01/2011.    Anticoagulation Management Assessment/Plan:      The patient's current anticoagulation dose is Warfarin sodium 5 mg tabs: Use as directed by Anticoagulation Clinic.  The target INR is 2.0-3.0.  The next INR is due 01/30/2010.  Anticoagulation instructions were given to patient.  Results were reviewed/authorized by Bethena Midget, RN, BSN.  She was notified by Bethena Midget, RN, BSN.         Prior Anticoagulation Instructions: INR 1.8 Today take 1 pill then resume 1 pill everyday except 1/2 pill on Mondays, Wednesdays, and Fridays. Recheck in 3 weeks.   Current Anticoagulation Instructions: INR 2.5 Continue 5mg s everyday except 2.5mg s on Mondays, Wednesdays and Fridays. Recheck in 4 weeks.

## 2011-01-09 NOTE — Miscellaneous (Signed)
  Clinical Lists Changes  Medications: Changed medication from KLOR-CON M20 20 MEQ CR-TABS (POTASSIUM CHLORIDE CRYS CR) 1 tab by mouth once daily to KLOR-CON M20 20 MEQ CR-TABS (POTASSIUM CHLORIDE CRYS CR) 1 tab by mouth twice daily

## 2011-01-09 NOTE — Medication Information (Signed)
Summary: rov/sp  Anticoagulant Therapy  Managed by: Weston Brass, PharmD Referring MD: Dietrich Pates, MD PCP: Oren Bracket Supervising MD: Shirlee Latch MD, Freida Busman Indication 1: Atrial Fibrillation Lab Used: [facility] [place] Site: [address] INR POC 2.3 INR RANGE 2-3  Dietary changes: no    Health status changes: no    Bleeding/hemorrhagic complications: no    Recent/future hospitalizations: no    Any changes in medication regimen? no    Recent/future dental: no  Any missed doses?: no       Is patient compliant with meds? yes       Allergies: No Known Drug Allergies  Anticoagulation Management History:      The patient is taking warfarin and comes in today for a routine follow up visit.  Positive risk factors for bleeding include an age of 55 years or older, history of CVA/TIA, history of GI bleeding, and presence of serious comorbidities.  The bleeding index is 'high risk'.  Positive CHADS2 values include History of CHF, History of HTN, Age > 64 years old, and Prior Stroke/CVA/TIA.  Her last INR was 2.4 RATIO.  Anticoagulation responsible provider: Shirlee Latch MD, Abou Sterkel.  INR POC: 2.3.  Cuvette Lot#: [id number].  Exp: 09/2011.    Anticoagulation Management Assessment/Plan:      The patient's current anticoagulation dose is Warfarin sodium 5 mg tabs: Use as directed by Anticoagulation Clinic.  The target INR is 2.0-3.0.  The next INR is due 07/26/2010.  Anticoagulation instructions were given to patient.  Results were reviewed/authorized by Weston Brass, PharmD.  She was notified by Dillard Cannon.         Prior Anticoagulation Instructions: INR 2.4  Continue same dose of 1 tablet every day except 1/2 tablet on Monday, Wednesday and Friday.   Current Anticoagulation Instructions: INR 2.3  Continue same dose of 1/2 tab on Monday, Wednesday, and Friday and 1 tab all other days.  Re-check INR in 4 weeks.

## 2011-01-09 NOTE — Assessment & Plan Note (Signed)
Summary: BLEEDING---ULCER///SPH   Vital Signs:  Patient profile:   77 year old female Weight:      163 pounds O2 Sat:      96 % on 2 L/min Temp:     98.0 degrees F oral Pulse rate:   78 / minute Resp:     19 per minute BP sitting:   122 / 60  (left arm) Cuff size:   large  Vitals Entered By: Shonna Chock CMA (September 04, 2010 3:47 PM)  O2 Flow:  2 L/min CC: Hospital Follow-up (Ulcer), Discuss Metoprolol, Heartburn   Primary Care Provider:  Oren Bracket  CC:  Hospital Follow-up (Ulcer), Discuss Metoprolol, and Heartburn.  History of Present Illness: D/C Summary reviewed ; no active symptoms since D/C except edema. She had gastric ulcers @ EGD; she required 4 units  pc. The patient denies acid reflux, sour taste in mouth, epigastric pain, chest pain, and trouble swallowing.  The patient reports the following alarm features of dyspepsia: dysphagia.  The patient denies the following alarm features: melena, hematemesis, and vomiting. Hgb was 9 this am;it wasin low 8 range in hospital. H&H checked every 2 weeks  by Dr Caryn Section.   Current Medications (verified): 1)  Synthroid 75 Mcg Tabs (Levothyroxine Sodium) .Marland Kitchen.. 1 By Mouth Qd 2)  Ultram 50 Mg  Tabs (Tramadol Hcl) .... 1/2-1 Q 8 - 12 Hours As Needed 3)  Cozaar 50 Mg Tabs (Losartan Potassium) .... Take One Tablet By Mouth Daily 4)  Vitamin C 500 Mg Tabs (Ascorbic Acid) .Marland Kitchen.. 1 Tab By Mouth Once Daily 5)  Oxygen 2 Liters 6)  Colchicine 0.6 Mg  Tabs (Colchicine) .... Take 1 Tablet By Mouth Two Times A Day As Needed 7)  Xopenex Mdi .... 2 Puffs At Breakfast, Lunch, and [employer]. 8)  Torsemide 20 Mg Tabs (Torsemide) .... 7 Tablets Once Daily 9)  Anucort-Hc 25 Mg Supp (Hydrocortisone Acetate) .Marland Kitchen.. 1 Rectally At Bedtime As Needed 10)  Klor-Con M20 20 Meq Cr-Tabs (Potassium Chloride Crys Cr) .Marland Kitchen.. 1 Tab By Mouth Twice Daily 11)  Simvastatin 20 Mg Tabs (Simvastatin) .... Take One Tablet By Mouth Daily At Bedtime 12)  Zaroxolyn 2.5 Mg Tabs  (Metolazone) .Marland Kitchen.. 1 Tab Every Other Day Prn 13)  Uloric 40 Mg Tabs (Febuxostat) .Marland Kitchen.. 1 Once Daily 14)  Procrit 63016 Unit/ml Soln (Epoetin Alfa) .... Take 1 Injection Q 2 Weeks. 15)  Protonix 40 Mg Tbec (Pantoprazole Sodium) .... Two Times A Day With Meals 16)  Tylenol 325 Mg Tabs (Acetaminophen) .... Every 4 Hours As Needed For Pain 17)  Xopenex Hfa 45 Mcg/act Aero (Levalbuterol Tartrate) .... 2 Puffs Three Times A Day  Allergies (verified): No Known Drug Allergies  Review of Systems CV:  Denies difficulty breathing at night, difficulty breathing while lying down, and swelling of feet.  Physical Exam  General:  Chronically ill appearing, in W/C but in no acute distress; appropriate and cooperative throughout examination Eyes:  No corneal or conjunctival inflammation noted but pallor present Lungs:  Normal respiratory effort, chest expands symmetrically. Lungs are clear to auscultation, no crackles or wheezes.No increased WOB , but on O2 Heart:  bradycardia and irregular rhythm.   Abdomen:  Bowel sounds positive,abdomen soft and non-tender without masses, organomegaly or hernias noted. Extremities:  1+ left & R  pedal edema.     Impression & Recommendations:  Problem # 1:  G I BLEED (ICD-578.9) due to gastric ulcers, second episode  Problem # 2:  EDEMA- LOCALIZED (ICD-782.3)  Her updated medication list for this problem includes:    Torsemide 20 Mg Tabs (Torsemide) .Marland KitchenMarland KitchenMarland KitchenMarland Kitchen 7 tablets once daily    Zaroxolyn 2.5 Mg Tabs (Metolazone) .Marland Kitchen... 1 tab every other day prn  Problem # 3:  ATRIAL FIBRILLATION (ICD-427.31) Dr Tenny Craw will determine whether benefit outweighs risk of Coumadin for AF; appt next week  The following medications were removed from the medication list:    Warfarin Sodium 5 Mg Tabs (Warfarin sodium) ..... Use as directed by anticoagulation clinic    Toprol Xl 100 Mg Tb24 (Metoprolol succinate) .Marland Kitchen... 1/2 tab as needed  Complete Medication List: 1)  Synthroid 75 Mcg Tabs  (Levothyroxine sodium) .Marland Kitchen.. 1 by mouth qd 2)  Ultram 50 Mg Tabs (Tramadol hcl) .... 1/2-1 q 8 - 12 hours as needed 3)  Cozaar 50 Mg Tabs (Losartan potassium) .... Take one tablet by mouth daily 4)  Vitamin C 500 Mg Tabs (Ascorbic acid) .Marland Kitchen.. 1 tab by mouth once daily 5)  Oxygen 2 Liters  6)  Colchicine 0.6 Mg Tabs (Colchicine) .... Take 1 tablet by mouth two times a day as needed 7)  Xopenex Mdi  .... 2 puffs at breakfast, lunch, and dinner. 8)  Torsemide 20 Mg Tabs (Torsemide) .... 7 tablets once daily 9)  Anucort-hc 25 Mg Supp (Hydrocortisone acetate) .Marland Kitchen.. 1 rectally at bedtime as needed 10)  Klor-con M20 20 Meq Cr-tabs (Potassium chloride crys cr) .Marland Kitchen.. 1 tab by mouth twice daily 11)  Simvastatin 20 Mg Tabs (Simvastatin) .... Take one tablet by mouth daily at bedtime 12)  Zaroxolyn 2.5 Mg Tabs (Metolazone) .Marland Kitchen.. 1 tab every other day prn 13)  Uloric 40 Mg Tabs (Febuxostat) .Marland Kitchen.. 1 once daily 14)  Procrit 04540 Unit/ml Soln (Epoetin alfa) .... Take 1 injection q 2 weeks. 15)  Protonix 40 Mg Tbec (Pantoprazole sodium) .... Two times a day with meals 16)  Tylenol 325 Mg Tabs (Acetaminophen) .... Every 4 hours as needed for pain 17)  Xopenex Hfa 45 Mcg/act Aero (Levalbuterol tartrate) .... 2 puffs three times a day  Patient Instructions: 1)  Please discuss risks off Coumadin with Dr Tenny Craw.Avoid triggers for acid production as discussed.Avoid foods high in acid (tomatoes, citrus juices, spicy foods). Avoid eating within two hours of lying down or before exercising. Do not over eat; try smaller more frequent meals. Elevate head of bed twelve inches when sleeping.  Appended Document: BLEEDING---ULCER///SPH Flu Vaccine Consent Questions     Do you have a history of severe allergic reactions to this vaccine? no    Any prior history of allergic reactions to egg and/or gelatin? no    Do you have a sensitivity to the preservative Thimersol? no    Do you have a past history of Guillan-Barre Syndrome? no     Do you currently have an acute febrile illness? no    Have you ever had a severe reaction to latex? no    Vaccine information given and explained to patient? yes    Are you currently pregnant? no    Lot Number:[id number]   Exp Date:06/09/2011   Site Given  Left Deltoid IM

## 2011-01-09 NOTE — Progress Notes (Signed)
Summary: Hgb 7.8   Phone Note From Other Clinic   Caller: Aram Beecham [phone] Call For: Russella Dar Summary of Call: Patient  was at Short Stay this am for a procrit shot.  Hgb today was 7.8. Dr Caryn Section has requested wwe move up EGD to this week.  Discussed with Dr Russella Dar patient is back on her coumadin.  Orders for Stat PT/INR given to Harriett Sine at [facility].  They will draw and send the patient home.  Once we have results of PT/INR I am to call Dr Russella Dar for him to decide about EGD. Initial call taken by: Darcey Nora RN, CGRN,  October 16, 2010 11:42 AM  Follow-up for Phone Call        INR today 3.86 Per Dr Russella Dar INR must be 2.5 or less for EGD.  Patient  reports dark stools yesterday , states she feels fine.  Advised of plans for possible EGD this week, but waiting to hear back from the coumadin clinic on instructions for coumadin.  Patient  aware.  Follow-up by: Darcey Nora RN, CGRN,  October 16, 2010 4:00 PM  Additional Follow-up for Phone Call Additional follow up Details #1::        I have left a voicemail for Crystal at Dr Fox's office with the above also.  I did speak with the coumadin clinic they will have the patient hold her coumadin, they will bring her in for a repeat CBC/INR on Wed pm.  Patient  is aware if she has any changes at all she is to call me.  Patient  aware once we have the INR we will arrange for EGD.   Additional Follow-up by: Darcey Nora RN, CGRN,  October 16, 2010 4:06 PM    Additional Follow-up for Phone Call Additional follow up Details #2::    Spoke with Gavin Pound, patient's daughter.  Pt has been instructed to hold Coumadin today and tomorrow.  Appt made to check INR/CBC on Wednesday.   Weston Brass PharmD  October 16, 2010 5:33 PM       Additional Follow-up for Phone Call Additional follow up Details #3:: Details for Additional Follow-up Action Taken: See append on CBC from 10/18/10.  patient to get 2 units PRBC tomorrow at [facility].  Needs to hold coumadin x 10 days  then can restart per Dr Russella Dar.  Daughter aware of all instructions on infusion PRBC.  She will keep the appointment for EGD in Dec for now Additional Follow-up by: Darcey Nora RN, CGRN,  October 18, 2010 2:23 PM

## 2011-01-09 NOTE — Progress Notes (Signed)
Summary: Lab Concerns  Phone Note Outgoing Call Call back at Home Phone [phone]   Call placed by: Shonna Chock CMA,  September 13, 2010 3:19 PM Call placed to: Patient Summary of Call: Spoke with [name], patient's daughter and informed her about labs drawn by [facility] (sent to be scanned and copy mailed to patient). Uric Acid level is elevated: 10.2 ref range 2.4-7.0, Creatinine 1.73 ref range 0.40-1.20, BUN 62 ref range 6-23. Dr.Hopper indicated Allopurinol can not be used due to Kidney impairment.   Per Dr.Hopper increase Uloric to 80mg  and recheck labs in 6 weeks. Patient would like Uloric sent to [facility] [address] and schedule appointment for mid- November to recheck labs.Shonna Chock CMA  September 13, 2010 3:30 PM     New/Updated Medications: ULORIC 80 MG TABS (FEBUXOSTAT) 1 by mouth once daily Prescriptions: ULORIC 80 MG TABS (FEBUXOSTAT) 1 by mouth once daily  #30 x 1   Entered by:   Shonna Chock CMA   Authorized by:   Marga Melnick MD   Signed by:   Shonna Chock CMA on 09/13/2010   Method used:   Electronically to        [id number] (retail)       [address] [place], [place]  [address]       Ph: [phone]       Fax: [phone]   RxID:   [id number]

## 2011-01-09 NOTE — Letter (Signed)
Summary: CMN for Oxygen/[facility]  CMN for Oxygen/[facility]   Imported By: Lanelle Bal 07/31/2010 11:05:05  _____________________________________________________________________  External Attachment:    Type:   Image     Comment:   External Document

## 2011-01-09 NOTE — Progress Notes (Signed)
Summary: Medication Concerns  Phone Note Outgoing Call Call back at [facility] Phone [phone]   Call placed by: Shonna Chock,  December 13, 2009 3:16 PM Call placed to: Patient Summary of Call: Left message on machine for patient to return call when avaliable, Reason for call:   Kidney function is impaired (creatinine 1.7 recently ); Allopurinol should be discontinued  Shonna Chock  December 13, 2009 3:16 PM   Follow-up for Phone Call        Patient returned my call and left message for me to call her back, she was now at home.  I called patient and she was unavaliable, I left message to d/c Allpurinol due to imparied kidney function. Dr.Hopper did not mention replacing medication.  Follow-up by: Shonna Chock,  December 13, 2009 4:10 PM  Additional Follow-up for Phone Call Additional follow up Details #1::        Left message on machine for patient informing her I spoke with Dr.Hopper, check uric acid in 6 weeks(274.9) . No rx will be prescribed in place of allpurinol right now Additional Follow-up by: Shonna Chock,  December 14, 2009 9:39 AM

## 2011-01-09 NOTE — Medication Information (Signed)
Summary: rov/sp  Anticoagulant Therapy  Managed by: Leota Sauers, PharmD, BCPS, CPP Referring MD: Dietrich Pates, MD PCP: Marga Melnick, MD  Supervising MD: Eden Emms MD, Theron Arista Indication 1: Atrial Fibrillation Lab Used: [facility] [place] Site: [address] INR POC 2.9 INR RANGE 2-3  Dietary changes: no    Health status changes: yes       Details: ? GI bleeding - plan endoscopy  Bleeding/hemorrhagic complications: yes       Details: possible  Recent/future hospitalizations: no    Any changes in medication regimen? no    Recent/future dental: no  Any missed doses?: yes     Details: off for procedure  Is patient compliant with meds? yes      Comments: off coumadin since mon 11/7 for planned endoscopy from ? GI bleeding  - HX GIB  Current Medications (verified): 1)  Synthroid 75 Mcg Tabs (Levothyroxine Sodium) .Marland Kitchen.. 1 By Mouth Qd 2)  Ultram 50 Mg  Tabs (Tramadol Hcl) .... 1/2-1 Q 8 - 12 Hours As Needed 3)  Cozaar 100 Mg Tabs (Losartan Potassium) .... 1/2 Tablet By Mouth Once Daily 4)  Vitamin C 500 Mg Tabs (Ascorbic Acid) .Marland Kitchen.. 1 Tab By Mouth Once Daily 5)  Oxygen 2 Liters 6)  Torsemide 20 Mg Tabs (Torsemide) .... 3 Tabs 2 Times Per Day 7)  Anucort-Hc 25 Mg Supp (Hydrocortisone Acetate) .Marland Kitchen.. 1 Rectally At Bedtime As Needed 8)  Klor-Con M20 20 Meq Cr-Tabs (Potassium Chloride Crys Cr) .... 2 Tabs By Mouth Twice Daily 9)  Simvastatin 20 Mg Tabs (Simvastatin) .... Take One Tablet By Mouth Daily At Bedtime 10)  Zaroxolyn 2.5 Mg Tabs (Metolazone) .Marland Kitchen.. 1 Tab Every Other Day Prn 11)  Procrit 46962 Unit/ml Soln (Epoetin Alfa) .... Take 1 Injection Q 2 Weeks. 12)  Protonix 40 Mg Tbec (Pantoprazole Sodium) .... Two Times A Day With Meals 13)  Tylenol 325 Mg Tabs (Acetaminophen) .... Every 4 Hours As Needed For Pain 14)  Xopenex Hfa 45 Mcg/act Aero (Levalbuterol Tartrate) .... 2 Puffs Three Times A Day 15)  Metoprolol Tartrate 50 Mg Tabs (Metoprolol Tartrate) .... One Tablet  By Mouth Once Daily 16)  Allopurinol 100 Mg Tabs (Allopurinol) .... Takes 200mg  By Mouth Once Daily  Allergies (verified): No Known Drug Allergies  Anticoagulation Management History:      The patient is taking warfarin and comes in today for a routine follow up visit.  Positive risk factors for bleeding include an age of 75 years or older, history of CVA/TIA, history of GI bleeding, and presence of serious comorbidities.  The bleeding index is 'high risk'.  Positive CHADS2 values include History of CHF, History of HTN, Age > 21 years old, and Prior Stroke/CVA/TIA.  Her last INR was 2.4 RATIO.  Anticoagulation responsible provider: Eden Emms MD, Theron Arista.  INR POC: 2.9.  Cuvette Lot#: [id number].  Exp: 11/2011.    Anticoagulation Management Assessment/Plan:      The target INR is 2.0-3.0.  The next INR is due 10/20/2010.  Anticoagulation instructions were given to patient.  Results were reviewed/authorized by Leota Sauers, PharmD, BCPS, CPP.         Prior Anticoagulation Instructions: INR 2.3  Continue taking same dose of 1 tablet everyday except 1/2 tablet on Monday, Wednesday, and Friday. Recheck in 4 weeks.   Current Anticoagulation Instructions: INR 2.9  No coumadin until otherwise directed  eat green vegies  recheck INR on Fri

## 2011-01-09 NOTE — Progress Notes (Signed)
Summary: med concern   Phone Note Call from Patient Call back at [phone]   Caller: daughter, Stanton Kidney Call For: Dr. Russella Dar Reason for Call: Talk to Nurse Summary of Call: thinks pt is supposed to be on Pantoprazole, but pharmacy says they have not received a rx for it... [facility] on [address] Initial call taken by: Vallarie Mare,  September 13, 2010 10:35 AM  Follow-up for Phone Call        Per Discharge summary pt is supposed to stay on PPI once daily. Rx was sent to pts pharmacy and pt's daughter notified.  Follow-up by: Christie Nottingham CMA Duncan Dull),  September 13, 2010 10:52 AM    Prescriptions: PROTONIX 40 MG TBEC (PANTOPRAZOLE SODIUM) two times a day with meals  #60 x 1   Entered by:   Christie Nottingham CMA (AAMA)   Authorized by:   Meryl Dare MD [facility]   Signed by:   Christie Nottingham CMA (AAMA) on 09/13/2010   Method used:   Electronically to        [id number]* (retail)       [address] [place], [place]  [address]       Ph: [phone]       Fax: [phone]   RxID:   [id number]

## 2011-01-09 NOTE — Miscellaneous (Signed)
Summary: Care Plan/[facility]  Care Plan/[facility]   Imported By: Lanelle Bal 10/03/2010 12:54:16  _____________________________________________________________________  External Attachment:    Type:   Image     Comment:   External Document

## 2011-01-09 NOTE — Miscellaneous (Signed)
Summary: SN Orders/[facility]  SN Orders/[facility]   Imported By: Lanelle Bal 09/20/2010 13:37:10  _____________________________________________________________________  External Attachment:    Type:   Image     Comment:   External Document

## 2011-01-09 NOTE — Progress Notes (Signed)
Summary: gout med to expensive  Phone Note Call from Patient Call back at Home Phone [phone]   Caller: Daughter Summary of Call: patient daughter called says that gout medication is $94 needs recommendation on different med   [facility] [address] Initial call taken by: Doristine Devoid CMA,  September 08, 2010 2:31 PM  Follow-up for Phone Call        Colchicine is no longer available. Uric acid, BUN, creat , K+ needed to assess options. What is so expensive : Uloric or Colcrys ? Follow-up by: Marga Melnick MD,  September 08, 2010 4:46 PM  Additional Follow-up for Phone Call Additional follow up Details #1::        spoke w/ patient daughter informed of information says that pharmacist told her that there were some other options that could be called in spoke w/ Dr. Alwyn Ren says patient needs to get recommendations from pharmacy so we could see if these are compatible w/ patient's current medication daughter to have pharmacy contact us.  spoke w/ pharmacist on duty isn't aware of any recommendation either informed patient she could go to pharmacy and pay for half a prescription to get her through weekend........Marland KitchenDoristine Devoid CMA  September 08, 2010 5:11 PM     New/Updated Medications: COLCHICINE 0.6 MG  TABS (COLCHICINE) Take 1 tablet by mouth two times a day as needed

## 2011-01-09 NOTE — Letter (Signed)
Summary: Appointment - Reminder 2  [facility], Main Office  [address]   [place], [place] [address]   Phone: [phone]  Fax: [phone]     May 26, 2010 MRN: [id number]   CHARITI HAVEL [address] [place], [place]  [address]   Dear Ms. Mayford Knife,  Our records indicate that it is time to schedule a follow-up appointment with Dr. Tenny Craw. It is very important that we reach you to schedule this appointment. We look forward to participating in your health care needs. Please contact us at the number listed above at your earliest convenience to schedule your appointment.  If you are unable to make an appointment at this time, give [place] a call so we can update our records.     Sincerely,   Glass blower/designer

## 2011-01-09 NOTE — Progress Notes (Signed)
Summary: medication questions   Phone Note Call from Patient Call back at [phone]   Caller: Daughter/ [name] Summary of Call: Pt daughter have questions about medication Initial call taken by: Judie Grieve,  October 03, 2010 8:44 AM  Follow-up for Phone Call        Pt's daughter wondering if pt. could take more steroids for her gout. She was discharged home on steroids from the hospital but has run out of them & pt still having flare ups. Advised them to call their PCP, Dr. Alwyn Ren, who is following her gout & talk to them about this. She agreed & understands. She will call us back with any further questions/problems. Whitney Maeola Sarah RN  October 03, 2010 9:11 AM  Follow-up by: Whitney Maeola Sarah RN,  October 03, 2010 9:07 AM

## 2011-01-09 NOTE — Assessment & Plan Note (Signed)
Summary: post hospital/afib/pla      Allergies Added: NKDA  Visit Type:  Follow-up Referring Provider:  na Primary Provider:  Marga Melnick, MD    History of Present Illness: patient is a 77 year old with a history of a restrictive cardiomyopathy, atrial fibrillaton, CHF, renal insuff, gout and diabetes.  I last saw her in August. Since then she was hospitalized with a UGI bleed.  She has been off coumadin.  She wasl also just d/c'd from the hospital for a gouty flare. Since d/c she is doing better.  She denies fullness in her neck.  No signif SOB.  Current Medications (verified): 1)  Synthroid 75 Mcg Tabs (Levothyroxine Sodium) .Marland Kitchen.. 1 By Mouth Qd 2)  Ultram 50 Mg  Tabs (Tramadol Hcl) .... 1/2-1 Q 8 - 12 Hours As Needed 3)  Cozaar 100 Mg Tabs (Losartan Potassium) .... 1/2 Tablet By Mouth Once Daily 4)  Vitamin C 500 Mg Tabs (Ascorbic Acid) .Marland Kitchen.. 1 Tab By Mouth Once Daily 5)  Oxygen 2 Liters 6)  Torsemide 20 Mg Tabs (Torsemide) .... 7 Tablets Once Daily 7)  Anucort-Hc 25 Mg Supp (Hydrocortisone Acetate) .Marland Kitchen.. 1 Rectally At Bedtime As Needed 8)  Klor-Con M20 20 Meq Cr-Tabs (Potassium Chloride Crys Cr) .Marland Kitchen.. 1 Tab By Mouth Twice Daily 9)  Simvastatin 20 Mg Tabs (Simvastatin) .... Take One Tablet By Mouth Daily At Bedtime 10)  Zaroxolyn 2.5 Mg Tabs (Metolazone) .Marland Kitchen.. 1 Tab Every Other Day Prn 11)  Procrit 91478 Unit/ml Soln (Epoetin Alfa) .... Take 1 Injection Q 2 Weeks. 12)  Protonix 40 Mg Tbec (Pantoprazole Sodium) .... Two Times A Day With Meals 13)  Tylenol 325 Mg Tabs (Acetaminophen) .... Every 4 Hours As Needed For Pain 14)  Xopenex Hfa 45 Mcg/act Aero (Levalbuterol Tartrate) .... 2 Puffs Three Times A Day 15)  Metoprolol Tartrate 50 Mg Tabs (Metoprolol Tartrate) .... One Tablet By Mouth Once Daily 16)  Allopurinol 100 Mg Tabs (Allopurinol) .... Takes 200mg  By Mouth Once Daily 17)  Prednisone 10 Mg Tabs (Prednisone) .... As Directed  Allergies (verified): No Known Drug  Allergies  Past History:  Past medical, surgical, family and social histories (including risk factors) reviewed, and no changes noted (except as noted below).  Past Medical History: Reviewed history from 09/25/2010 and no changes required. Restrictive cardiomyopathy AF 1996,S/P cardioversion;  CVA 1999; COAD exacerbation 2007; sleep apnea/CPAP (29562) ANEMIA NOS (ICD-285.9), iron deficiency  GOUT (ICD-274.9) PEPTIC ULCER, ACUTE, HEMORRHAGE, HX OF (ICD-V12.71), 2005, 2011 HYPOTHYROIDISM (ICD-244.9) HYPERTENSION (ICD-401.9) Diastolic Dysfunction Hx adenomatous colon polyps  03/2004 GERD with hiatal hernia Diverticulosis Anxiety Disorder Arthritis Hemorrhoids-internal and external COPD MORBID OBESITY CAD, ONE VESSEL, BY CATH 08/06/08 EJECTION FRACTION OVERALL PRESERVED BY ECHO 04/30/08 SUPPLEMENTAL HOME OXYGEN Renal failure, Dr Caryn Section Hyperlipidemia  Past Surgical History: Reviewed history from 09/25/2010 and no changes required. Appendectomy Oophorectomy Incision and drainage of cellulitis/umbilical abcess- 5/09; Cath : pulmonary HTN, non obstructive CAD Hysterectomy  Family History: Reviewed history from 10/31/2009 and no changes required. heart disease: father MI; brother CABG, 5 vessel clotting disorders: brother rheumatism: brother cancer: mother (breast) sister(breast) No FH of Colon Cancer:  Social History: Reviewed history from 09/25/2010 and no changes required. pt is retired. worked at [employer] married one child pt never smoked  Alcohol Use - no Patient does not get regular exercise, Oxygen dependent.  Daily Caffeine Use: one daily   Vital Signs:  Patient profile:   77 year old female Height:      34  inches Weight:      155 pounds BMI:     31.42 Pulse rate:   57 / minute BP sitting:   95 / 43  (left arm) Cuff size:   regular  Vitals Entered By: Burnett Kanaris, CNA (September 29, 2010 2:14 PM)  Physical Exam  Additional Exam:  patient is in  NAD in wheelchair. HEENT:  Normocephalic, atraumatic. EOMI, PERRLA.  Neck: JVP is normal. No thyromegaly. No bruits.  Lungs: clear to auscultation. No rales no wheezes.  Heart: Irregular rate and rhythm. Normal S1, S2. No S3  Abdomen:  Supple, nontender. Normal bowel sounds. No masses. No hepatomegaly.  Extremities:   Good distal pulses throughout. No lower extremity edema.  Musculoskeletal :moving all extremities.  Neuro:   alert and oriented x3.    EKG  Procedure date:  09/29/2010  Findings:      Atrial fibrillation.  57 bpm. RBBB.   T wave inversion I, II, AVL,  V1-V6.  Impression & Recommendations:  Problem # 1:  CHF (ICD-428.0) Volume status does not look bad.  I would not change regimen for now.  Problem # 2:  ATRIAL FIBRILLATION (ICD-427.31) Continue rate control .  Resume coumadin.  will need close f/u of INR and CBC.  Problem # 3:  GASTRIC ULCER (ICD-531.90) Will need f/u CBCs Due for endoscopy in December.  Problem # 4:  HYPERLIPIDEMIA-MIXED (ICD-272.4) Keep on current regimen. Her updated medication list for this problem includes:    Simvastatin 20 Mg Tabs (Simvastatin) .Marland Kitchen... Take one tablet by mouth daily at bedtime  Problem # 5:  GOUT (ICD-274.9) Continue allopurinol. Her updated medication list for this problem includes:    Allopurinol 100 Mg Tabs (Allopurinol) .Marland Kitchen... Takes 200mg  by mouth once daily  Patient Instructions: 1)  Your physician recommends that you schedule a follow-up appointment in:3 months with Dr.Ross, CCR in one week. 2)  Your physician has recommended you make the following change in your medication: Coumodin 5 mg take one tablet tuesday, Thursday, Saturday and Sunday. 2.5 mg  tablet Monday, Wednesday and Friday. Metoprolol Tartrate take 1/2 tablet or 25 mg once a day. 3)  Pt. to call Annice Pih in one week with B/P readings.

## 2011-01-09 NOTE — Progress Notes (Signed)
Summary: Rx needs to clarification on Rx sent in  Medications Added METOPROLOL SUCCINATE 50 MG XR24H-TAB (METOPROLOL SUCCINATE) Take 1/2 tablet by mouth daily       Phone Note Refill Request Call back at [phone] Message from:  Pharmacy  Refills Requested: Medication #1:  METOPROLOL TARTRATE 50 MG TABS one tablet by mouth once daily pharmacy needs to confirm Rx because the last one that was sent in was changed so they need to confirm  Initial call taken by: Omer Jack,  November 14, 2010 12:39 PM  Follow-up for Phone Call        spoke with pharmacy and checked echart, ? was succ or tartrate...Marland Kitchen  per hospital records it is succinate and 1/2 tab daily. Follow-up by: Hardin Negus, RMA,  November 16, 2010 1:54 PM    New/Updated Medications: METOPROLOL SUCCINATE 50 MG XR24H-TAB (METOPROLOL SUCCINATE) Take 1/2 tablet by mouth daily

## 2011-01-09 NOTE — Miscellaneous (Signed)
  Clinical Lists Changes  Observations: Added new observation of ECHOINTERP:  - Left ventricle: The cavity size was normal. Wall thickness was     increased in a pattern of mild LVH. The estimated ejection     fraction was 55%. Wall motion was normal; there were no regional     wall motion abnormalities. Doppler parameters are consistent with     high ventricular filling pressure.   - Mitral valve: Calcified annulus. Mild regurgitation.   - Left atrium: The atrium was massively dilated.   - Right ventricle: The cavity size was mildly dilated. Systolic     function was mildly reduced.   - Right atrium: The atrium was massively dilated.   - Pulmonary arteries: PA peak pressure: 51mm Hg (S).   Impressions:    - The huge atria are c/w the prior diagnosis of restrictive     cardiomyopathy. (10/20/2010 15:40) Added new observation of DOPPLER LEG:  - No evidence of deep vein or superficial thrombosis involving the     right lower extremity and left lower extremity.   - No evidence of Baker's cyst on the right or left. (09/19/2010 15:41)      Echocardiogram  Procedure date:  10/20/2010  Findings:       - Left ventricle: The cavity size was normal. Wall thickness was     increased in a pattern of mild LVH. The estimated ejection     fraction was 55%. Wall motion was normal; there were no regional     wall motion abnormalities. Doppler parameters are consistent with     high ventricular filling pressure.   - Mitral valve: Calcified annulus. Mild regurgitation.   - Left atrium: The atrium was massively dilated.   - Right ventricle: The cavity size was mildly dilated. Systolic     function was mildly reduced.   - Right atrium: The atrium was massively dilated.   - Pulmonary arteries: PA peak pressure: 51mm Hg (S).   Impressions:    - The huge atria are c/w the prior diagnosis of restrictive     cardiomyopathy.  Venous Doppler  Procedure date:  09/19/2010  Findings:       - No  evidence of deep vein or superficial thrombosis involving the     right lower extremity and left lower extremity.   - No evidence of Baker's cyst on the right or left.

## 2011-01-09 NOTE — Miscellaneous (Signed)
Summary: [facility] Plan/[facility]  Care Plan/[facility]   Imported By: Lanelle Bal 10/26/2010 09:58:46  _____________________________________________________________________  External Attachment:    Type:   Image     Comment:   External Document

## 2011-01-09 NOTE — Medication Information (Signed)
Summary: rov/ewj  Anticoagulant Therapy  Managed by: Bethena Midget, RN, BSN Referring MD: Dietrich Pates, MD PCP: Oren Bracket Supervising MD: Riley Kill MD, Maisie Fus Indication 1: Atrial Fibrillation Lab Used: [facility] [place] Site: [address] INR POC 1.8 INR RANGE 2-3  Dietary changes: no    Health status changes: no    Bleeding/hemorrhagic complications: no    Recent/future hospitalizations: no    Any changes in medication regimen? no    Recent/future dental: no  Any missed doses?: no       Is patient compliant with meds? yes       Allergies: No Known Drug Allergies  Anticoagulation Management History:      The patient is taking warfarin and comes in today for a routine follow up visit.  Positive risk factors for bleeding include an age of 24 years or older, history of CVA/TIA, history of GI bleeding, and presence of serious comorbidities.  The bleeding index is 'high risk'.  Positive CHADS2 values include History of CHF, History of HTN, Age > 49 years old, and Prior Stroke/CVA/TIA.  Her last INR was 2.4 RATIO.  Anticoagulation responsible provider: Riley Kill MD, Maisie Fus.  INR POC: 1.8.  Cuvette Lot#: [id number].  Exp: 01/2011.    Anticoagulation Management Assessment/Plan:      The patient's current anticoagulation dose is Warfarin sodium 5 mg tabs: Use as directed by Anticoagulation Clinic.  The target INR is 2.0-3.0.  The next INR is due 01/02/2010.  Anticoagulation instructions were given to patient.  Results were reviewed/authorized by Bethena Midget, RN, BSN.  She was notified by Bethena Midget, RN, BSN.         Prior Anticoagulation Instructions: INR 2.2  Continue on same dosage 1 tablet daily except 1/2 tablet on Mondays, Wednesdays, and Fridays.   Recheck in 4 weeks.    Current Anticoagulation Instructions: INR 1.8 Today take 1 pill then resume 1 pill everyday except 1/2 pill on Mondays, Wednesdays, and Fridays. Recheck in 3 weeks.

## 2011-01-09 NOTE — Progress Notes (Signed)
Summary: HANDICAP PLACARD RENEWAL  Phone Note Call from Patient Call back at Home Phone [phone]   Caller: Spouse Complaint: Urinary/GYN Problems Summary of Call: PATIENT'S HUSBAND DROPPED OFF HANDICAP PLACARD RENEWAL PAPERWORK---NO BILLING SHEET--WILL TAKE TO DR HOPPERS NURSE IN [place]LAST[place]E  PLEASE CALL [phone] WHEN READY FOR PICKUP Initial call taken by: Jerolyn Shin,  July 27, 2010 12:03 PM  Follow-up for Phone Call        I held this phone note after MD assured me it had been completed, please check to make sure the picked up her placard. Thanks. Lucious Groves CMA  August 01, 2010 9:47 AM   Additional Follow-up for Phone Call Additional follow up Details #1::        Dr.Hopper please advise, do you have this paperwork Additional Follow-up by: Shonna Chock CMA,  August 01, 2010 10:15 AM    Additional Follow-up for Phone Call Additional follow up Details #2::    I completed this  ; we need a designated site to store these  Follow-up by: Marga Melnick MD,  August 01, 2010 2:17 PM  Additional Follow-up for Phone Call Additional follow up Details #3:: Details for Additional Follow-up Action Taken: per [facility] pt husband has already pick-up paperwork, also left message for pt to return call to confirm paperwork was recieved..........Marland KitchenFelecia Deloach CMA  August 01, 2010 2:53 PM   I spoke with pt and she states that her husband received the paperwork. Army Fossa CMA  August 01, 2010 4:23 PM

## 2011-01-09 NOTE — Progress Notes (Signed)
Summary: Medication Refill   Phone Note Other Incoming Call back at [phone]   Caller: Patient's Daughter Summary of Call: Patient's daughter, [name], called stating that Dr. Russella Dar wanted patient to continue on suppositories. Please call her prescription in to [facility] on [address]. Any questions, please call [name]. Initial call taken by: Schuyler Amor,  October 16, 2010 3:09 PM  Follow-up for Phone Call        rx sent, patient aware Follow-up by: Darcey Nora RN, CGRN,  October 16, 2010 4:20 PM    Prescriptions: ANUCORT-HC 25 MG SUPP (HYDROCORTISONE ACETATE) 1 rectally at bedtime as needed  #12 x 1   Entered by:   Darcey Nora RN, CGRN   Authorized by:   Meryl Dare MD [facility]   Signed by:   Darcey Nora RN, CGRN on 10/16/2010   Method used:   Electronically to        [id number]* (retail)       [address] [place], [place]  [address]       Ph: [phone]       Fax: [phone]   RxID:   [id number]

## 2011-01-09 NOTE — Procedures (Signed)
Summary: Upper Endoscopy  Patient: [name] Note: All result statuses are Final unless otherwise noted.  Tests: (1) Upper Endoscopy (EGD)   EGD Upper Endoscopy       DONE     [facility]     [address]     [place], [place]  [address]           ENDOSCOPY PROCEDURE REPORT           PATIENT:  [name], [name]  MR#:  [id number]     BIRTHDATE:  1929/04/09, 81 yrs. old  GENDER:  female     ENDOSCOPIST:  Judie Petit T. Russella Dar, MD, [facility]           PROCEDURE DATE:  08/21/2010     PROCEDURE:  EGD for control of bleeding     ASA CLASS:  Class III     INDICATIONS:  hemorrhage of GI tract     MEDICATIONS:  Fentanyl 50 mcg IV, Versed 4 mg IV     TOPICAL ANESTHETIC:  Cetacaine Spray     DESCRIPTION OF PROCEDURE:   After the risks benefits and     alternatives of the procedure were thoroughly explained, informed     consent was obtained.  The Pentax Gastroscope [id number] endoscope     was introduced through the mouth and advanced to the second     portion of the duodenum, without limitations.  The instrument was     slowly withdrawn as the mucosa was fully examined.     <<PROCEDUREIMAGES>>     Three ulcers were found in the antrum. They were 10 - 15 mm in     size. Two ulcers were clean based. One ulcer had a non-bleeding     vissible vessel. Bipolar coagulation therapy was applied with     ablation of the vessel. The esophagus and gastroesophageal     junction were completely normal in appearance.  The stomach was     entered and closely examined. The angularis, and lesser curvature     were well visualized, including a retroflexed view of the cardia     and fundus. The stomach wall was normally distensable. The scope     passed easily through the pylorus into the duodenum. The duodenal     bulb was normal in appearance, as was the postbulbar duodenum.     Retroflexed views revealed no abnormalities. The scope was then     withdrawn from the patient and the procedure  completed.           COMPLICATIONS:  None           ENDOSCOPIC IMPRESSION:     1) 10 - 15 mm ulcers, multiple in the antrum           RECOMMENDATIONS:     1) continue PPI     2) endoscopy in 8-10 weeks           Malcolm T. Russella Dar, MD, Clement[facility] Graham           n.     eSIGNED:   [name] at 08/21/2010 04:13 PM           Mickle Plumb, [id number]  Note: An exclamation mark (!) indicates a result that was not dispersed into the flowsheet. Document Creation Date: 08/21/2010 4:14 PM _______________________________________________________________________  (1) Order result status: Final Collection or observation date-time: 08/21/2010 15:00 Requested date-time:  Receipt date-time:  Reported date-time:  Referring Physician:   Ordering  Physician: Claudette Head [phone]) Specimen Source:  Source: [name] Order Number: [id number] Lab site:   Appended Document: Upper Endoscopy Scheduled NP3 09/25/10 3:00 with Jennye Moccasin, PA  to set up EGD

## 2011-01-09 NOTE — Progress Notes (Signed)
Summary: questions re appt   Phone Note Call from Patient   Caller: Daughter Stanton Kidney [phone] Reason for Call: Talk to Nurse Summary of Call: pt's dtr debra trying to make appt for pt, on the days pt can come in dr Tenny Craw doesn't have anything until dec, but wants to know what to do re coumadin, or can she be worked in sooner?  Initial call taken by: Glynda Jaeger,  September 25, 2010 1:36 PM  Follow-up for Phone Call        Spoke with pt's daughter who would like pt to see Dr. Tenny Craw regarding timing of resuming coumadin.  Appointment made with Dr. Tenny Craw for September 29, 2010 at 2:00. Follow-up by: Dossie Arbour, RN, BSN,  September 25, 2010 3:54 PM

## 2011-01-09 NOTE — Procedures (Signed)
Summary: Oxim[facility]  Oximetry/[facility]   Imported By: Lanelle Bal 07/06/2010 13:04:08  _____________________________________________________________________  External Attachment:    Type:   Image     Comment:   External Document

## 2011-01-09 NOTE — Procedures (Signed)
Summary: Colonoscopy    Colonoscopy  Procedure date:  10/21/2010  Findings:      Results: Polyp.  Results: Hemorrhoids.     Results: Diverticulosis.       Location:  [facility].   OPERATIVE PROCEDURE REPORT PATIENT: [name], [name] MR#: [id number] BIRTHDATE: [birth year] GENDER: female ENDOSCOPIST: Jeani Hawking, MD PROCEDURE DATE: 10/21/2010 PROCEDURE: Colonoscopy with snare polypectomy, Colonoscopy with submucosal injection, Colon w/ endoscopic clipping ASA CLASS: Class III INDICATIONS: Anemia/Hematochezia MEDICATIONS: Fentanyl 75 mcg IV, Versed 7 mg IV DESCRIPTION OF PROCEDURE: After the risks benefits and alternatives of the procedure were thoroughly explained, informed consent was obtained. Digital rectal exam was performed and revealed no abnormalities. The endoscope was introduced through the anus and advanced to the cecum, which was identified by both the appendix and ileocecal valve, without limitations. The quality of the prep was good, using Colyte. The instrument was then slowly withdrawn as the colon was fully examined. [facility] [address] [place], [place] [address] FINDINGS: The colon was difficult to traverse through the sigmoid region. There appears to be an area of benign stenosis in the sigmoid colon secondary to her diverticula. ? History of diverticulitis in this area. With gentle maneuvering and external pressure the cecum was able to be intubated. In the cecum a 6 mm sessile polyp was removed with a cold snare in two passes. In the transverse colon, a 21 cm polyp overlying a fold was identified. The flexible snare was not able to capture the polyp. Subsequently the polyp was lifted with 1 ml of normal saline and a stiff snare was able to capture the polyp. Resection was complete in two passes with cautery. Because of the recent history of bleeding, her requirements for coumadin, and the use of hot cautery two hemoclips were applied to the  area. 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 No evidence of AVMs or inflammation. Retroflexed views in the rectum revealed internal and external hemorrhoids. The scope was then withdrawn from the patient and the procedure terminated. COMPLICATIONS: None IMPRESSION: 1) Polyps, multiple 2) Internal and external hemorrhoids 3) Diverticula 4) Descending colon lipoma RECOMMENDATIONS: 1) Follow [facility]. 2) Await biopsy results. 3) Given her age, comorbidities, and the current findings during the colonoscopy I do not recommend a follow up colonoscopy if the polyps are benign. _______________________________ Jeani Hawking, MD n. Rosalie DoctorJeani Hawking at 10/21/2010 08:58 AM Page 2

## 2011-01-09 NOTE — Progress Notes (Signed)
Summary: triage   Phone Note Call from Patient Call back at [phone]   Caller: daughter, Stanton Kidney Call For: Dr. Russella Dar Reason for Call: Talk to Nurse Summary of Call: per daughter, Dr. Russella Dar would like to see pt in early December for hospital f/u and pre-procedure visit Initial call taken by: Vallarie Mare,  November 06, 2010 12:10 PM  Follow-up for Phone Call        She is scheduled for EGD 12/26/09, do you want to see her in the office prior to that? Family is wanting to schedule an REV.  Please advise Follow-up by: Darcey Nora RN, CGRN,  November 06, 2010 1:43 PM  Additional Follow-up for Phone Call Additional follow up Details #1::        Since I  saw her in the hospital and she had an EGD this month she does not need follow up with me until her EGD. She does need follow up with her PCP to monitor anemia and other problems. Additional Follow-up by: Meryl Dare MD Clementeen Graham,  November 06, 2010 2:42 PM    Additional Follow-up for Phone Call Additional follow up Details #2::    They have a follow up appointment with cardiology this Thursday.  When do you feel it is safe for her to resume coumadin? Follow-up by: Darcey Nora RN, CGRN,  November 06, 2010 4:00 PM  Additional Follow-up for Phone Call Additional follow up Details #3:: Details for Additional Follow-up Action Taken: I reviewed her records from her recent hospitalization. Schedule capsule endoscopy to further evaluate for possible causes of GI bleeding. If there are no significant findings on her CE study, her Hb is above 9 and her stool is heme negative her PCP or Cardiologist can consider restart at anytime.  Additional Follow-up by: Meryl Dare MD Clementeen Graham,  November 06, 2010 4:20 PM  I have scheduled the capsule with patient's daughter.  She is scheduled for 11/13/10 8:00 Darcey Nora RN, [facility]  November 06, 2010 4:48 PM

## 2011-01-09 NOTE — Letter (Signed)
Summary: [facility] Office Note  [facility] Office Note   Imported By: Roderic Ovens 03/20/2010 11:29:13  _____________________________________________________________________  External Attachment:    Type:   Image     Comment:   External Document

## 2011-01-09 NOTE — Progress Notes (Signed)
Summary: Refill Request  Phone Note Refill Request Call back at Home Phone [phone] Message from:  Patient  Refills Requested: Medication #1:  ALLOPURINOL 100 MG TABS Takes 200mg  by mouth once daily. [facility] [address]   Method Requested: Electronic Initial call taken by: Shonna Chock CMA,  November 09, 2010 10:43 AM    Prescriptions: ALLOPURINOL 100 MG TABS (ALLOPURINOL) Takes 200mg  by mouth once daily  #60 x 3   Entered by:   Shonna Chock CMA   Authorized by:   Marga Melnick MD   Signed by:   Shonna Chock CMA on 11/09/2010   Method used:   Electronically to        [id number]* (retail)       [address] [place], [place]  [address]       Ph: [phone]       Fax: [phone]   RxID:   [id number]

## 2011-01-09 NOTE — Assessment & Plan Note (Signed)
Summary: per check out/sf      Allergies Added: NKDA  Visit Type:  Follow-up Primary Provider:  Chrissie Noa Hopper,MD  CC:  shoulder pain.  History of Present Illness: Patient is a 77 year old with a history of a restrictive cardiomyopathy, atrial fibrillaton, CHF, renal insuff, gout and diabetes.  I last saw her in March Since seen she has had blood work and also a carotid USN (mild stable disease). since I saw her she has done fairly well.  Denies any signif neck fullness/pressure which has been her sign of volume overload.  Breathing is faiirly good  Uses 2l O2.  Appetite is not that good but has been a chronic problem.  Notes occasional diarrhea, occasionally with BRBPR (she has attrib to straining).  Currently no diarrhea.  Current Medications (verified): 1)  Warfarin Sodium 5 Mg Tabs (Warfarin Sodium) .... Use As Directed By Anticoagulation Clinic 2)  Toprol Xl 100 Mg Tb24 (Metoprolol Succinate) .... 1/2 Tab As Needed 3)  Synthroid 75 Mcg Tabs (Levothyroxine Sodium) .Marland Kitchen.. 1 By Mouth Qd 4)  Ultram 50 Mg  Tabs (Tramadol Hcl) .... 1/2-1 Q 8 - 12 Hours As Needed 5)  Cozaar 50 Mg Tabs (Losartan Potassium) .... Take One Tablet By Mouth Daily 6)  Vitamin C 500 Mg Tabs (Ascorbic Acid) .Marland Kitchen.. 1 Tab By Mouth Once Daily 7)  Oxygen 2 Liters 8)  Colchicine 0.6 Mg  Tabs (Colchicine) .... Take 1 Tablet By Mouth Two Times A Day As Needed 9)  Xopenex Mdi .... 2 Puffs At Breakfast, Lunch, and [employer]. 10)  Omeprazole 40 Mg Cpdr (Omeprazole) .Marland Kitchen.. 1 Tab As Needed 11)  Torsemide 20 Mg Tabs (Torsemide) .... 7 Tablets Once Daily 12)  Anucort-Hc 25 Mg Supp (Hydrocortisone Acetate) .Marland Kitchen.. 1 Rectally At Bedtime As Needed 13)  Klor-Con M20 20 Meq Cr-Tabs (Potassium Chloride Crys Cr) .Marland Kitchen.. 1 Tab By Mouth Twice Daily 14)  Simvastatin 20 Mg Tabs (Simvastatin) .... Take One Tablet By Mouth Daily At Bedtime 15)  Zaroxolyn 2.5 Mg Tabs (Metolazone) .Marland Kitchen.. 1 Tab Every Other Day Prn 16)  Uloric 40 Mg Tabs (Febuxostat) .Marland Kitchen.. 1  Once Daily 17)  Procrit 69629 Unit/ml Soln (Epoetin Alfa) .... Take 1 Injection Q 2 Weeks.  Allergies (verified): No Known Drug Allergies  Past History:  Past medical, surgical, family and social histories (including risk factors) reviewed, and no changes noted (except as noted below).  Past Medical History: Reviewed history from 10/31/2009 and no changes required. Restrictive cardiomyopathy AF 1996,S/P cardioversion;  CVA 1999; COAD exacerbation 2007; sleep apnea/CPAP (52841) ANEMIA NOS (ICD-285.9), iron deficiency  GOUT (ICD-274.9) PEPTIC ULCER, ACUTE, HEMORRHAGE, HX OF (ICD-V12.71), 2005 HYPOTHYROIDISM (ICD-244.9) HYPERTENSION (ICD-401.9) Diastolic Dysfunction Hx adenomatous colon polyps  GERD with hiatal hernia Diverticulosis Anxiety Disorder Arthritis Hemorrhoids-internal and external COPD MORBID OBESITY CAD, ONE VESSEL, BY CATH 08/06/08 EJECTION FRACTION OVERALL PRESERVED BY ECHO 04/30/08 SUPPLEMENTAL HOME OXYGEN Renal failure, Dr Caryn Section  Past Surgical History: Reviewed history from 10/31/2009 and no changes required. Appendectomy Oophorectomy Incision and drainage of cellulitis/umbilical abcess- 5/09; Cath : pulmonary HTN, non obstructive CAD PMH-FH-SH reviewed-no changes except otherwise noted  Family History: Reviewed history from 10/31/2009 and no changes required. heart disease: father MI; brother CABG, 5 vessel clotting disorders: brother rheumatism: brother cancer: mother (breast) sister(breast) No FH of Colon Cancer:  Social History: Reviewed history from 10/31/2009 and no changes required. Patient never smoked.  pt is retired. worked at [employer]. Alcohol Use - no Patient does not get regular exercise, Oxygen  dependent.   Vital Signs:  Patient profile:   77 year old female Height:      59 inches Weight:      167 pounds BMI:     33.85 Pulse rate:   78 / minute BP sitting:   122 / 50  (left arm) Cuff size:   large  Vitals Entered By:  Burnett Kanaris, CNA (August 07, 2010 10:36 AM)  Physical Exam  Additional Exam:  patient an obese 77 year old in NAD HEENT:  Normocephalic, atraumatic. EOMI, PERRLA.  Neck: JVP is at 10. No thyromegaly. No bruits.  Lungs: clear to auscultation. No rales no wheezes.  Heart: Regular rate and rhythm. Normal S1, S2. No S3.   No significant murmurs. PMI not displaced.  Abdomen:  Supple, nontender. No definite hepatomegaly.  Extremities:   Good distal pulses throughout. No lower extremity edema.  Musculoskeletal :moving all extremities.  Neuro:   alert and oriented x3.    Impression & Recommendations:  Problem # 1:  CHF (ICD-428.0) clincially patient is doing well.  Volume appears good.  I would continue on current regimen, I would check labs (BUN/Cr).  Problem # 2:  ANEMIA-IRON DEFICIENCY (ICD-280.9) Patient getting shot today.  Will have Hgb checked.  Notes occasional   Other Orders: EKG w/ Interpretation (93000) TLB-BMP (Basic Metabolic Panel-BMET) (80048-METABOL)  Patient Instructions: 1)  Your physician recommends that you return for lab work in: bmet today..we will call you with results. 2)  Your physician wants you to follow-up in: 6 months   You will receive a reminder letter in the mail two months in advance. If you don't receive a letter, please call our office to schedule the follow-up appointment.

## 2011-01-09 NOTE — Op Note (Signed)
Summary: EGD and biopsy                         [facility]  Patient:    [name], [name]                     MRN: [id number] Proc. Date: 04/22/00 Adm. Date:  [id number] Attending:  Ardelle Anton CC:         Mahala Menghini. Evonnie Dawes, M.D. [facility]                           Procedure Report  PROCEDURE PERFORMED:  Esophagogastroduodenoscopy with biopsy.  ENDOSCOPIST:  Wilhemina Bonito. Eda Keys., M.D. [facility]  INDICATIONS FOR PROCEDURE:  GI bleed.  HISTORY:  The patient is a 77 year old white female with chronic atrial fibrillation for which she is on Coumadin.  She presented to the hospital recently with upper GI bleeding.  Hemoglobin was 3.7.  INR 3.5.  Her coagulopathy has been corrected.  She has been transfused.  She is stable.  She is now for upper endoscopy.  The nature of the procedure as well as the risks, benefits and alternatives were reviewed.  She understood and agreed to proceed.  PHYSICAL EXAMINATION:  Well-appearing female in no acute distress.  She is alert and oriented.  Vital signs are stable.  Lungs are clear.  Heart is regular. Abdomen is obese and soft.  DESCRIPTION OF PROCEDURE:  After informed consent was obtained the patient was sedated with 40 mg of Demerol and 3 mg of Versed IV.  The Olympus endoscope was  passed orally under direct vision into the esophagus.  The esophagus revealed three tongues of short segment columnar type mucosa consistent with Barretts esophagus. Gastroesophageal junction was estimated to be 35 cm from the incisors.  The tongues of columnar type epithelium extended for approximately 1.5 cm.  The stomach revealed a sliding hiatal hernia.  In addition, multiple clean based antral ulcers. Six were identified.  The measured between 5 and 8 mm.  Biopsies of the antrum ere taken for CLO testing.  There was no active bleeding.  No stigmata of recent bleed. The duodenal bulb and postbulbar duodenum were normal.  Biopsies of the esophagus were  also taken to exclude Barretts esophagus.  IMPRESSION: 1. Recent gastrointestinal bleed secondary to multiple antral ulcers in the face    of anticoagulation therapy. 2. Rule out Barretts esophagus.  RECOMMENDATIONS: 1. Follow up biopsies. 2. Follow up CLO test and treat if positive. 3. Protonix 40 mg daily indefinitely. 4. Avoid aspirin and nonsteroidal anti-inflammatory drugs. 5. Hold Coumadin until repeat endoscopy. 6. Repeat endoscopy in approximately one month to assure ulcer healing. 7. Advance diet with anticipated discharge home later today or tomorrow morning. DD:  04/22/00 TD:  04/24/00 Job: [id number]       SP Surgical Pathology - STATUS: Final             By: Guilford Shi MD , Clovis Pu       Perform Date: 64QIH47 20:54  Ordered By: Eda Keys Md , Wilhemina Bonito,         Ordered Date:  Facility: [facility]                              Department: CPATH  Service Report Text  [facility]   [address]  [address]   [place], [place] [address]   [phone]    REPORT OF SURGICAL PATHOLOGY    Case #: [id number]   Patient Name: [name], [name]   PID: [id number]   Pathologist: Marcie Bal, MD   DOB/Age July 17, 1929 (Age: 51) Gender: F   Date Taken: 04/22/2000   Date Received: 04/22/2000    FINAL DIAGNOSIS    ***MICROSCOPIC EXAMINATION AND DIAGNOSIS***    ESOPHAGUS: INTESTINAL METAPLASIA CONSISTENT WITH BARRETT' S   ESOPHAGUS. NO DYSPLASIA OR MALIGNANCY IDENTIFIED.    COMMENT   Alcian blue stain shows intestinal metaplasia. The control   stained appropriately.    ab   Date Reported: 04/23/2000 Marcie Bal, MD   *** Electronically Signed Out By TAZ ***    Clinical information   R/O Barrett' s esophagus/dysplasia. (tmc)    specimen(s) obtained   Esophagus, biopsy    Gross Description   Received in formalin are tan, soft tissue fragments that are   submitted in toto. Number: 3   Size: each 0.2 cm (SSW:smr 5/14)    smr/

## 2011-01-09 NOTE — Progress Notes (Signed)
Summary: refill pt out medication   Phone Note Refill Request Message from:  Patient on November 14, 2010 8:38 AM  Refills Requested: Medication #1:  METOPROLOL TARTRATE 50 MG TABS one tablet by mouth once daily [facility]  [phone]  Initial call taken by: Judie Grieve,  November 14, 2010 8:38 AM    Prescriptions: METOPROLOL TARTRATE 50 MG TABS (METOPROLOL TARTRATE) one tablet by mouth once daily  #30 x 10   Entered by:   Hardin Negus, RMA   Authorized by:   Sherrill Raring, MD, [facility]   Signed by:   Hardin Negus, RMA on 11/14/2010   Method used:   Electronically to        [id number] (retail)       [address] [place], [place]  [address]       Ph: [phone]       Fax: [phone]   RxID:   [id number]

## 2011-01-09 NOTE — Assessment & Plan Note (Signed)
Summary: post hospital GI bleed/needs repeat EGD   History of Present Illness Visit Type: Follow-up Visit Primary GI MD: Elie Goody MD [facility] Primary Provider: Marga Melnick, MD  Requesting Provider: na Chief Complaint: [facility] f/u for GI bleed.  Pt c/o rectal bleeding with hemorrhoids History of Present Illness:   [name] returns today with her husband and daughter. She was rehospitalized for flare of gout. I have reviewed the discharge summary. Following that discharge, she had a small amount of bright red blood per rectum with a bowel movement, which she attributed to hemorrhoids. She has not noted any blood in the past 2 days. She has no other gastrointestinal complaints. She does feel tired and weak. She's been off Coumadin for approximately one month. Her last colonoscopy was in 2005 and showed small adenomatous colon polyps.   GI Review of Systems    Reports acid reflux, bloating, heartburn, and  loss of appetite.      Denies abdominal pain, belching, chest pain, dysphagia with liquids, dysphagia with solids, nausea, vomiting, vomiting blood, weight loss, and  weight gain.      Reports constipation, diarrhea, hemorrhoids, and  rectal bleeding.     Denies anal fissure, black tarry stools, change in bowel habit, diverticulosis, fecal incontinence, heme positive stool, irritable bowel syndrome, jaundice, light color stool, liver problems, and  rectal pain.   Current Medications (verified): 1)  Synthroid 75 Mcg Tabs (Levothyroxine Sodium) .Marland Kitchen.. 1 By Mouth Qd 2)  Ultram 50 Mg  Tabs (Tramadol Hcl) .... 1/2-1 Q 8 - 12 Hours As Needed 3)  Cozaar 100 Mg Tabs (Losartan Potassium) .... 1/2 Tablet By Mouth Once Daily 4)  Vitamin C 500 Mg Tabs (Ascorbic Acid) .Marland Kitchen.. 1 Tab By Mouth Once Daily 5)  Oxygen 2 Liters 6)  Torsemide 20 Mg Tabs (Torsemide) .... 7 Tablets Once Daily 7)  Anucort-Hc 25 Mg Supp (Hydrocortisone Acetate) .Marland Kitchen.. 1 Rectally At Bedtime As Needed 8)  Klor-Con M20 20 Meq  Cr-Tabs (Potassium Chloride Crys Cr) .Marland Kitchen.. 1 Tab By Mouth Twice Daily 9)  Simvastatin 20 Mg Tabs (Simvastatin) .... Take One Tablet By Mouth Daily At Bedtime 10)  Zaroxolyn 2.5 Mg Tabs (Metolazone) .Marland Kitchen.. 1 Tab Every Other Day Prn 11)  Procrit 16109 Unit/ml Soln (Epoetin Alfa) .... Take 1 Injection Q 2 Weeks. 12)  Protonix 40 Mg Tbec (Pantoprazole Sodium) .... Two Times A Day With Meals 13)  Tylenol 325 Mg Tabs (Acetaminophen) .... Every 4 Hours As Needed For Pain 14)  Xopenex Hfa 45 Mcg/act Aero (Levalbuterol Tartrate) .... 2 Puffs Three Times A Day 15)  Metoprolol Tartrate 50 Mg Tabs (Metoprolol Tartrate) .... One Tablet By Mouth Once Daily 16)  Allopurinol 100 Mg Tabs (Allopurinol) .... Takes 200mg  By Mouth Once Daily 17)  Prednisone 10 Mg Tabs (Prednisone) .... As Directed  Allergies (verified): No Known Drug Allergies  Past History:  Past Medical History: Restrictive cardiomyopathy AF 1996,S/P cardioversion;  CVA 1999; COAD exacerbation 2007; sleep apnea/CPAP (60454) ANEMIA NOS (ICD-285.9), iron deficiency  GOUT (ICD-274.9) PEPTIC ULCER, ACUTE, HEMORRHAGE, HX OF (ICD-V12.71), 2005, 2011 HYPOTHYROIDISM (ICD-244.9) HYPERTENSION (ICD-401.9) Diastolic Dysfunction Hx adenomatous colon polyps  03/2004 GERD with hiatal hernia Diverticulosis Anxiety Disorder Arthritis Hemorrhoids-internal and external COPD MORBID OBESITY CAD, ONE VESSEL, BY CATH 08/06/08 EJECTION FRACTION OVERALL PRESERVED BY ECHO 04/30/08 SUPPLEMENTAL HOME OXYGEN Renal failure, Dr Caryn Section Hyperlipidemia  Past Surgical History: Appendectomy Oophorectomy Incision and drainage of cellulitis/umbilical abcess- 5/09; Cath : pulmonary HTN, non obstructive CAD Hysterectomy  Family History: Reviewed  history from 10/31/2009 and no changes required. heart disease: father MI; brother CABG, 5 vessel clotting disorders: brother rheumatism: brother cancer: mother (breast) sister(breast) No FH of Colon Cancer:  Social  History: pt is retired. worked at [employer] married one child pt never smoked  Alcohol Use - no Patient does not get regular exercise, Oxygen dependent.  Daily Caffeine Use: one daily   Review of Systems       The patient complains of arthritis/joint pain, fatigue, hearing problems, itching, shortness of breath, swelling of feet/legs, and urine leakage.         The pertinent positives and negatives are noted as above and in the HPI. All other ROS were reviewed and were negative.   Vital Signs:  Patient profile:   77 year old female Height:      59 inches Pulse rate:   68 / minute Pulse rhythm:   regular BP sitting:   124 / 60  (left arm) Cuff size:   regular  Vitals Entered By: Ok Anis CMA (September 25, 2010 2:45 PM)  Physical Exam  General:  Fatigued, pale, chronically ill-appearing with continuous oxygen by nasal cannula and using a wheelchair Head:  Normocephalic and atraumatic. Eyes:  PERRLA, no icterus. Mouth:  No deformity or lesions, dentition normal. Lungs:  Clear throughout to auscultation. Heart:  Regular rate and rhythm; no murmurs, rubs,  or bruits. Abdomen:  Soft, nontender and nondistended. No masses, hepatosplenomegaly or hernias noted. Normal bowel sounds. Rectal:  No lesions 1+ Hemoccult positive, soft, brown stool in the vault Neurologic:  Alert and  oriented x4;  grossly normal neurologically. Psych:  Alert and cooperative. Normal mood and affect.  Impression & Recommendations:  Problem # 1:  GASTRIC ULCER (ICD-531.90) Recent upper GI bleed from multiple gastric ulcers. Continue Protonix 40 mg twice daily. I think it would be safe to resume Coumadin at this point from a GI standpoint. Schedule endoscopy to document ulcer healing in early December.  Problem # 2:  BLOOD IN STOOL (ICD-578.1) Small-volume hematochezia and Hemoccult-positive stool. I suspect this is secondary to hemorrhoids. Will treat with over-the-counter operation H.  suppositories for 5 days, and she may retrieve as needed. If her rectal bleeding, persists, will need to consider colonoscopy for further evaluation, however, she is at increased risk due to her multiple comorbidities.  Problem # 3:  COLONIC POLYPS, ADENOMATOUS, HX OF (ICD-V12.72) Personal history of adenomatous colon polyps. No plans for routine surveillance colonoscopy due to her comorbidities.  Problem # 4:  GOUT (ICD-274.9)  Other Orders: EGD (EGD)  Patient Instructions: 1)  Upper Endoscopy brochure given.  2)  Copy sent to : Marga Melnick, MD 3)  The medication list was reviewed and reconciled.  All changed / newly prescribed medications were explained.  A complete medication list was provided to the patient / caregiver.

## 2011-01-09 NOTE — Assessment & Plan Note (Signed)
Summary: 4 MONTH ROV/SL  Medications Added COZAAR 50 MG TABS (LOSARTAN POTASSIUM) Take one tablet by mouth daily TORSEMIDE 20 MG TABS (TORSEMIDE) 7 tablets once daily      Allergies Added: NKDA  Visit Type:  Follow-up Primary Provider:  Oren Bracket  CC:  no complaints.  History of Present Illness: Patient is a 77 year old with a history of a restrictive cardiomyopathy, atrial fibrillaton, CHF, renal insuff, gout and diabetes.  I last saw her in December. Since seen she has said her breathing is pretty good.  If she gets a little more fluid  she will get a little more short of breath.  She takes an additional demedex  and it improve.s The patient was just seen by Dr. Caryn Section yesterday.  Labs were drawn on 3/15.  BUN was 105, Cr 1.8 Yesterday bp was 100/60.  She was hemoccult negative.  He dropped her losartan to 50 mg per day.  Questions if demedex can be decreased.  Interesting that the patient has, for about a wk, dropped back on her demedex to 3 in the AM, 2 at night (from 4 and 3) to conserve meds.  She also cut back because she is so thirsty at night and has cramps.  She says her breathing has not gotten worse since doing this.  She denies chest pain.  No dizziness.  Current Medications (verified): 1)  Warfarin Sodium 5 Mg Tabs (Warfarin Sodium) .... Use As Directed By Anticoagulation Clinic 2)  Toprol Xl 100 Mg Tb24 (Metoprolol Succinate) .... 1/2 Tab As Needed 3)  Synthroid 75 Mcg Tabs (Levothyroxine Sodium) .Marland Kitchen.. 1 By Mouth Qd 4)  Ultram 50 Mg  Tabs (Tramadol Hcl) .... 1/2-1 Q 8 - 12 Hours As Needed 5)  Cozaar 50 Mg Tabs (Losartan Potassium) .... Take One Tablet By Mouth Daily 6)  Vitamin C 500 Mg Tabs (Ascorbic Acid) .Marland Kitchen.. 1 Tab By Mouth Once Daily 7)  Oxygen 2 Liters 8)  Colchicine 0.6 Mg  Tabs (Colchicine) .... Take 1 Tablet By Mouth Two Times A Day As Needed 9)  Xopenex Mdi .... 2 Puffs At Breakfast, Lunch, and [employer]. 10)  Omeprazole 40 Mg Cpdr (Omeprazole) .Marland Kitchen.. 1 Tab As  Needed 11)  Torsemide 20 Mg Tabs (Torsemide) .... 7 Tablets Once Daily 12)  Anucort-Hc 25 Mg Supp (Hydrocortisone Acetate) .Marland Kitchen.. 1 Rectally At Bedtime 13)  Klor-Con M20 20 Meq Cr-Tabs (Potassium Chloride Crys Cr) .Marland Kitchen.. 1 Tab By Mouth Once Daily 14)  Simvastatin 20 Mg Tabs (Simvastatin) .... Take One Tablet By Mouth Daily At Bedtime 15)  Zaroxolyn 2.5 Mg Tabs (Metolazone) .Marland Kitchen.. 1 Tab Every Other Day Prn 16)  Uloric 40 Mg Tabs (Febuxostat) .Marland Kitchen.. 1 Once Daily  Allergies (verified): No Known Drug Allergies  Past History:  Past medical, surgical, family and social histories (including risk factors) reviewed, and no changes noted (except as noted below).  Past Medical History: Reviewed history from 10/31/2009 and no changes required. Restrictive cardiomyopathy AF 1996,S/P cardioversion;  CVA 1999; COAD exacerbation 2007; sleep apnea/CPAP (19147) ANEMIA NOS (ICD-285.9), iron deficiency  GOUT (ICD-274.9) PEPTIC ULCER, ACUTE, HEMORRHAGE, HX OF (ICD-V12.71), 2005 HYPOTHYROIDISM (ICD-244.9) HYPERTENSION (ICD-401.9) Diastolic Dysfunction Hx adenomatous colon polyps  GERD with hiatal hernia Diverticulosis Anxiety Disorder Arthritis Hemorrhoids-internal and external COPD MORBID OBESITY CAD, ONE VESSEL, BY CATH 08/06/08 EJECTION FRACTION OVERALL PRESERVED BY ECHO 04/30/08 SUPPLEMENTAL HOME OXYGEN Renal failure, Dr Caryn Section  Past Surgical History: Reviewed history from 10/31/2009 and no changes required. Appendectomy Oophorectomy Incision and drainage of cellulitis/umbilical  abcess- 5/09; Cath : pulmonary HTN, non obstructive CAD  Family History: Reviewed history from 10/31/2009 and no changes required. heart disease: father MI; brother CABG, 5 vessel clotting disorders: brother rheumatism: brother cancer: mother (breast) sister(breast) No FH of Colon Cancer:  Social History: Reviewed history from 10/31/2009 and no changes required. Patient never smoked.  pt is retired. worked at [employer]. Alcohol Use - no Patient does not get regular exercise, Oxygen dependent.   Vital Signs:  Patient profile:   77 year old female Height:      59 inches Weight:      178 pounds BMI:     36.08 Pulse rate:   50 / minute BP sitting:   124 / 50  (left arm) Cuff size:   large  Vitals Entered By: Burnett Kanaris, CNA (March 03, 2010 10:53 AM)  Physical Exam  Additional Exam:  Patent is is NAD in wheelchair. HEENT:  Normocephalic, atraumatic. EOMI, PERRLA.  Neck: JVP is normal. No thyromegaly. No bruits.  Lungs: clear to auscultation. No rales no wheezes.  Heart: Irregular  rate and rhythm. Normal S1, S2. No S3.   No significant murmurs. PMI not displaced.  Abdomen:  Supple, nontender. Normal bowel sounds. No masses. No hepatomegaly.  Extremities:   Good distal pulses throughout. No lower extremity edema.  Musculoskeletal :moving all extremities.  Neuro:   alert and oriented x3.    Impression & Recommendations:  Problem # 1:  CHF (ICD-428.0) Patient's volume status looks pretty good.  SHe has cut back on diuretics on her  own.  It would be important to check her labs today and compare to labs from 3/15.    Problem # 2:  RENAL FAILURE (ICD-586) Check Cr.  Problem # 3:  HYPERLIPIDEMIA-MIXED (ICD-272.4) Keep on current regimen Her updated medication list for this problem includes:    Simvastatin 20 Mg Tabs (Simvastatin) .Marland Kitchen... Take one tablet by mouth daily at bedtime  Problem # 4:  ANEMIA-IRON DEFICIENCY (ICD-280.9) CHeck CBC.  Problem # 5:  ATRIAL FIBRILLATION (ICD-427.31) Assessment: Unchanged Keep on same regimen.  Problem # 6:  GOUT (ICD-274.9) Check uric acid on uloric and colchicine.  Other Orders: TLB-BMP (Basic Metabolic Panel-BMET) (80048-METABOL) TLB-BNP (B-Natriuretic Peptide) (83880-BNPR) TLB-CBC Platelet - w/Differential (85025-CBCD) TLB-Uric Acid, Blood (84550-URIC)  Patient Instructions: 1)  Your physician recommends that you return for lab  work in: lab work today...we will call you with results   Appended Document: 4 MONTH ROV/SL Not on ASA with history of Fe defic anemia and coumadin use.

## 2011-01-09 NOTE — Assessment & Plan Note (Signed)
Summary: discuss oxygen level//kn   Vital Signs:  Patient profile:   77 year old female Weight:      173.4 pounds O2 Sat:      95 % Temp:     98.3 degrees F oral Pulse rate:   76 / minute Resp:     17 per minute BP sitting:   130 / 70  (left arm) Cuff size:   large  Vitals Entered By: Shonna Chock CMA (June 27, 2010 9:43 AM) CC: Follow-up visit: per post card recevied from [facility]   Primary Care Provider:  Oren Bracket  CC:  Follow-up visit: per post card recevied from [facility].  History of Present Illness: "Medicare  won't  pay  for oxygen unless I come in".She is on 2 L/min 24 hrs /day; no increase with exercise. Dyspnea improved since O2 initiated. She does not use CPAP nightly but only with SOB.O2 indicated due to  PMH of  age related (never smoked) COAD, Sleep Apnea, anemia, CHF, & AF   Current Medications (verified): 1)  Warfarin Sodium 5 Mg Tabs (Warfarin Sodium) .... Use As Directed By Anticoagulation Clinic 2)  Toprol Xl 100 Mg Tb24 (Metoprolol Succinate) .... 1/2 Tab As Needed 3)  Synthroid 75 Mcg Tabs (Levothyroxine Sodium) .Marland Kitchen.. 1 By Mouth Qd 4)  Ultram 50 Mg  Tabs (Tramadol Hcl) .... 1/2-1 Q 8 - 12 Hours As Needed 5)  Cozaar 50 Mg Tabs (Losartan Potassium) .... Take One Tablet By Mouth Daily 6)  Vitamin C 500 Mg Tabs (Ascorbic Acid) .Marland Kitchen.. 1 Tab By Mouth Once Daily 7)  Oxygen 2 Liters 8)  Colchicine 0.6 Mg  Tabs (Colchicine) .... Take 1 Tablet By Mouth Two Times A Day As Needed 9)  Xopenex Mdi .... 2 Puffs At Breakfast, Lunch, and [employer]. 10)  Omeprazole 40 Mg Cpdr (Omeprazole) .Marland Kitchen.. 1 Tab As Needed 11)  Torsemide 20 Mg Tabs (Torsemide) .... 7 Tablets Once Daily 12)  Anucort-Hc 25 Mg Supp (Hydrocortisone Acetate) .Marland Kitchen.. 1 Rectally At Bedtime As Needed 13)  Klor-Con M20 20 Meq Cr-Tabs (Potassium Chloride Crys Cr) .Marland Kitchen.. 1 Tab By Mouth Twice Daily 14)  Simvastatin 20 Mg Tabs (Simvastatin) .... Take One Tablet By Mouth Daily At Bedtime 15)  Zaroxolyn  2.5 Mg Tabs (Metolazone) .Marland Kitchen.. 1 Tab Every Other Day Prn 16)  Uloric 40 Mg Tabs (Febuxostat) .Marland Kitchen.. 1 Once Daily 17)  Procrit 25956 Unit/ml Soln (Epoetin Alfa) .... Take 1 Injection Q 2 Weeks.  Allergies (verified): No Known Drug Allergies  Review of Systems General:  Denies sleep disorder. CV:  Complains of swelling of feet; denies difficulty breathing at night, difficulty breathing while lying down, and swelling of hands; DOE post 10 yards or <. Resp:  Denies cough, excessive snoring, morning headaches, and sputum productive; Intermittent daytime somnulence.  Physical Exam  General:  in no acute distress; alert,appropriate and cooperative throughout examination Lungs:  Normal respiratory effort, chest expands symmetrically. Lungs are clear to auscultation, no crackles or wheezes.SURPRIZINGLY clear. Heart:  bradycardia and irregular rhythm.   Pulses:  R and L carotid,radial  pulses are full and equal bilaterally. Decreased pedal pulses Extremities:  No clubbing, cyanosis; trace - 1/2+ edema. Psych:  memory intact for recent and remote, normally interactive, and good eye contact.     Impression & Recommendations:  Problem # 1:  DYSPNEA (ICD-786.05)  Multifactorial  Orders: Misc. Referral (Misc. Ref)  Problem # 2:  CHF (ICD-428.0) stable Her updated medication list for this problem  includes:    Warfarin Sodium 5 Mg Tabs (Warfarin sodium) ..... Use as directed by anticoagulation clinic    Toprol Xl 100 Mg Tb24 (Metoprolol succinate) .Marland Kitchen... 1/2 tab as needed    Cozaar 50 Mg Tabs (Losartan potassium) .Marland Kitchen... Take one tablet by mouth daily    Torsemide 20 Mg Tabs (Torsemide) .Marland KitchenMarland KitchenMarland KitchenMarland Kitchen 7 tablets once daily    Zaroxolyn 2.5 Mg Tabs (Metolazone) .Marland Kitchen... 1 tab every other day prn  Problem # 3:  OBSTRUCTIVE SLEEP APNEA (ICD-327.23)  patient D/Ced CPAP  Orders: Misc. Referral (Misc. Ref)  Problem # 4:  ANEMIA-UNSPECIFIED (ICD-285.9) complicating dyspnea when progressive Her updated  medication list for this problem includes:    Procrit 04540 Unit/ml Soln (Epoetin alfa) .Marland Kitchen... Take 1 injection q 2 weeks.  Complete Medication List: 1)  Warfarin Sodium 5 Mg Tabs (Warfarin sodium) .... Use as directed by anticoagulation clinic 2)  Toprol Xl 100 Mg Tb24 (Metoprolol succinate) .... 1/2 tab as needed 3)  Synthroid 75 Mcg Tabs (Levothyroxine sodium) .Marland Kitchen.. 1 by mouth qd 4)  Ultram 50 Mg Tabs (Tramadol hcl) .... 1/2-1 q 8 - 12 hours as needed 5)  Cozaar 50 Mg Tabs (Losartan potassium) .... Take one tablet by mouth daily 6)  Vitamin C 500 Mg Tabs (Ascorbic acid) .Marland Kitchen.. 1 tab by mouth once daily 7)  Oxygen 2 Liters  8)  Colchicine 0.6 Mg Tabs (Colchicine) .... Take 1 tablet by mouth two times a day as needed 9)  Xopenex Mdi  .... 2 puffs at breakfast, lunch, and dinner. 10)  Omeprazole 40 Mg Cpdr (Omeprazole) .Marland Kitchen.. 1 tab as needed 11)  Torsemide 20 Mg Tabs (Torsemide) .... 7 tablets once daily 12)  Anucort-hc 25 Mg Supp (Hydrocortisone acetate) .Marland Kitchen.. 1 rectally at bedtime as needed 13)  Klor-con M20 20 Meq Cr-tabs (Potassium chloride crys cr) .Marland Kitchen.. 1 tab by mouth twice daily 14)  Simvastatin 20 Mg Tabs (Simvastatin) .... Take one tablet by mouth daily at bedtime 15)  Zaroxolyn 2.5 Mg Tabs (Metolazone) .Marland Kitchen.. 1 tab every other day prn 16)  Uloric 40 Mg Tabs (Febuxostat) .Marland Kitchen.. 1 once daily 17)  Procrit 98119 Unit/ml Soln (Epoetin alfa) .... Take 1 injection q 2 weeks.  Patient Instructions: 1)  Overnight Oxygen Level Study will be scheduled.

## 2011-01-09 NOTE — Letter (Signed)
Summary: Patient Notice-Endo Biopsy Results  [facility]  [address] [place], [place] [address]   Phone: [phone]  Fax: [phone]        October 23, 2010 MRN: [id number]    [name] [address] [place], [place]  [address]    Dear Ms. Mayford Knife,  I am pleased to inform you that the biopsies taken during your recent endoscopic examination did not show any evidence of cancer upon pathologic examination. The biopsies showed an ulceration.  Continue with the treatment plan as outlined on the day of your      exam.  You should have a repeat endoscopic examination for this problem              in 2 months.  Please call us if you are having persistent problems or have questions about your condition that have not been fully answered at this time.  Sincerely,  Meryl Dare MD [facility]  This letter has been electronically signed by your physician.  Appended Document: Patient Notice-Endo Biopsy Results Letter mailed.

## 2011-01-09 NOTE — Miscellaneous (Signed)
Summary: SN Orders/[facility]  SN Orders/[facility]   Imported By: Lanelle Bal 10/17/2010 13:08:52  _____________________________________________________________________  External Attachment:    Type:   Image     Comment:   External Document

## 2011-01-09 NOTE — Procedures (Signed)
Summary: EGD/[facility]  EGD/[facility]   Imported By: Sherian Rein 09/26/2010 11:27:09  _____________________________________________________________________  External Attachment:    Type:   Image     Comment:   External Document

## 2011-01-09 NOTE — Progress Notes (Signed)
Summary: dtr debra calling re being off coumadin   Phone Note Call from Patient   Caller: Patient Reason for Call: Talk to Nurse Summary of Call: pt has been off coumadin for 4 wks and had appt this week they had to cancel , pt has gout, rs to next week, dtr worried she has been off for so long and wanted to know if she can be off another week? [phone] Initial call taken by: Glynda Jaeger,  September 11, 2010 9:16 AM  Follow-up for Phone Call        Called pt's daughter [name]) and advised per Dr.Ross that it is OK to still have Coumadin on hold because of recent GI bleed. Layne Benton, RN, BSN  September 11, 2010 3:26 PM

## 2011-01-09 NOTE — Medication Information (Signed)
Summary: ccr/saf   Anticoagulant Therapy  Managed by: Weston Brass, PharmD Referring MD: Dietrich Pates, MD PCP: Marga Melnick, MD  Supervising MD: Excell Seltzer MD, Casimiro Needle Indication 1: Atrial Fibrillation Lab Used: [facility] [place] Site: [address] INR POC 2.3 INR RANGE 2-3  Dietary changes: no    Health status changes: yes       Details: bleeding ulcers, gout  Bleeding/hemorrhagic complications: yes       Details: ulcers  Recent/future hospitalizations: yes       Details: yes, for 10 days had bleeding ulcers and another visit for gout.   Any changes in medication regimen? yes       Details: colchicine, allopurinol, protonix  Recent/future dental: no  Any missed doses?: yes     Details: just started coumadin last friday.  Is patient compliant with meds? yes       Allergies: No Known Drug Allergies  Anticoagulation Management History:      The patient is taking warfarin and comes in today for a routine follow up visit.  Positive risk factors for bleeding include an age of 75 years or older, history of CVA/TIA, history of GI bleeding, and presence of serious comorbidities.  The bleeding index is 'high risk'.  Positive CHADS2 values include History of CHF, History of HTN, Age > 57 years old, and Prior Stroke/CVA/TIA.  Her last INR was 2.4 RATIO.  Anticoagulation responsible provider: Excell Seltzer MD, Casimiro Needle.  INR POC: 2.3.  Cuvette Lot#: [id number].  Exp: 11/2011.    Anticoagulation Management Assessment/Plan:      The target INR is 2.0-3.0.  The next INR is due 10/30/2010.  Anticoagulation instructions were given to patient.  Results were reviewed/authorized by Weston Brass, PharmD.  She was notified by Ilean Skill D candidate.         Prior Anticoagulation Instructions: INR 2.7  Continue 1 tablet daily except 1/2 tablet Mon, Wed and Fri.  Return to clinic in 4 weeks.  Current Anticoagulation Instructions: INR 2.3  Continue taking same dose of 1 tablet everyday  except 1/2 tablet on Monday, Wednesday, and Friday. Recheck in 4 weeks.

## 2011-01-09 NOTE — Letter (Signed)
Summary: CMN for [facility]  CMN for [facility]   Imported By: Lanelle Bal 09/15/2010 12:29:27  _____________________________________________________________________  External Attachment:    Type:   Image     Comment:   External Document

## 2011-01-09 NOTE — Miscellaneous (Signed)
Summary: Face to Face Encounter/[facility]  Face to Face Encounter/[facility]   Imported By: Lanelle Bal 10/23/2010 12:54:51  _____________________________________________________________________  External Attachment:    Type:   Image     Comment:   External Document

## 2011-01-09 NOTE — Letter (Signed)
Summary: EGD Instructions  [facility]  [address] [place], [place] [address]   Phone: [phone]  Fax: [phone]       [name]    November 17, 1929    MRN: [id number]       Procedure Day Dorna Bloom: Wednesday December 14th, 2011     Arrival Time:  10:00am     Procedure Time: 11:00am     Location of Procedure:                    _x  _ [facility] (4th Floor)    PREPARATION FOR ENDOSCOPY   On 11/22/10 THE DAY OF THE PROCEDURE:  1.   No solid foods, milk or milk products are allowed after midnight the night before your procedure.  2.   Do not drink anything colored red or purple.  Avoid juices with pulp.  No orange juice.  3.  You may drink clear liquids until 9:00am, which is 2 hours before your procedure.                                                                                                CLEAR LIQUIDS INCLUDE: Water Jello Ice Popsicles Tea (sugar ok, no milk/cream) Powdered fruit flavored drinks Coffee (sugar ok, no milk/cream) Gatorade Juice: apple, white grape, white cranberry  Lemonade Clear bullion, consomm, broth Carbonated beverages (any kind) Strained chicken noodle soup Hard Candy   MEDICATION INSTRUCTIONS  Unless otherwise instructed, you should take regular prescription medications with a small sip of water as early as possible the morning of your procedure.       OTHER INSTRUCTIONS  You will need a responsible adult at least 75 years of age to accompany you and drive you home.   This person must remain in the waiting room during your procedure.  Wear loose fitting clothing that is easily removed.  Leave jewelry and other valuables at home.  However, you may wish to bring a book to read or an iPod/MP3 player to listen to music as you wait for your procedure to start.  Remove all body piercing jewelry and leave at home.  Total time from sign-in until discharge is approximately 2-3 hours.  You should go  home directly after your procedure and rest.  You can resume normal activities the day after your procedure.  The day of your procedure you should not:   Drive   Make legal decisions   Operate machinery   Drink alcohol   Return to work  You will receive specific instructions about eating, activities and medications before you leave.    The above instructions have been reviewed and explained to me by   Marchelle Folks.     I fully understand and can verbalize these instructions _____________________________ Date _________

## 2011-01-09 NOTE — Procedures (Signed)
Summary: Colonoscopy and biopsy   Colonoscopy  Procedure date:  03/28/2004  Findings:      Results: Polyp.  Results: Hemorrhoids.     Location:  [facility].   Patient Name: [name], [name] MRN: [id number] Procedure Procedures: Colonoscopy CPT: 95621.    with biopsy. CPT: Q5068410.    with Hot Biopsy(s)CPT: Z451292.    with polypectomy. CPT: A3573898.  Personnel: Endoscopist: Venita Lick. Russella Dar, MD, Clementeen Graham.  Referred By: Dietrich Pates, MD.  Exam Location: Exam performed in Endoscopy Suite. Inpatient-ward  Patient Consent: Procedure, Alternatives, Risks and Benefits discussed, consent obtained, from patient.  Indications  Evaluation of: Anemia with low ferritin. with low iron saturation.  History  Current Medications: Patient is currently taking Coumadin.  Pre-Exam Physical: Performed Mar 28, 2004. Entire physical exam was normal.  Exam Exam: Extent of exam reached: Cecum, extent intended: Cecum.  The cecum was identified by appendiceal orifice and IC valve. Colon retroflexion performed. ASA Classification: III. Tolerance: good.  Monitoring: Pulse and BP monitoring, Oximetry used. Supplemental O2 given.  Colon Prep Used Miralax for colon prep. Prep results: fair, adequate exam.  Sedation Meds: Patient assessed and found to be appropriate for moderate (conscious) sedation. Fentanyl 75 mcg. given IV. Versed 7.5 mg. given IV.  Comments: Tortuous colon-moderately difficult procedure Findings DIVERTICULOSIS: Transverse Colon to Sigmoid Colon.  NORMAL EXAM: Ascending Colon to Hepatic Flexure.  OTHER FINDING: submucosal lesion found in Sigmoid Colon. Biopsy/Other Finding taken. Comments: 2.5cm yellowish, rounded,soft, pillow sign c/w a lipoma.  POLYP: Cecum, Maximum size: 8 mm. sessile polyp. Procedure:  snare with cautery, removed, retrieved, Polyp sent to pathology. ICD9: Colon Polyps: 211.3.  POLYP: Sigmoid Colon, Maximum size: 4 mm. sessile polyp. Procedure:  hot  biopsy, removed, retrieved, sent to pathology. ICD9: Colon Polyps: 211.3.  HEMORRHOIDS: Internal. Size: Medium. Not bleeding. Not thrombosed. ICD9: Hemorrhoids, Internal: 455.0.   Assessment  Diagnoses: 211.3: Colon Polyps.  455.0: Hemorrhoids, Internal.   Events  Unplanned Interventions: No intervention was required.  Unplanned Events: There were no complications. Plans Medication Plan: Continue current medications.  Disposition: After procedure patient sent to recovery. After recovery patient sent back to hospital.  Scheduling/Referral: EGD, to [facility] T. Russella Dar, MD, Clementeen Graham, Mar 28, 2004.  Referring provider, to Dietrich Pates, MD,    This report was created from the original endoscopy report, which was reviewed and signed by the above listed endoscopist.    [facility] Surgical Pathology - STATUS: Final             By: Almyra Free MD , Terron Mer[facility]        Perform Date: 19Apr05 00:00  Ordered By: Rica Records Date: 19Apr05 16:48  Facility: [facility]                              Department: CPATH  Service Report Text  [facility]   [address]   [place], [place] [address]   [phone]    REPORT OF SURGICAL PATHOLOGY    Case #: [id number]   Patient Name: [name], [name]   PID: [id number]   Pathologist: Cira Rue L. Almyra Free, MD   DOB/Age [birth year] (Age: 77) Gender: F   Date Taken: 03/28/2004   Date Received: 03/28/2004    FINAL DIAGNOSIS    ***MICROSCOPIC EXAMINATION AND DIAGNOSIS***    1. ENDOSCOPIC BIOPSY, CECAL COLON POLYPS: ADENOMATOUS  POLYP(S).   NO HIGH GRADE DYSPLASIA OR INVASIVE MALIGNANCY IDENTIFIED.    2. ENDOSCOPIC BIOPSY, SIGMOID COLON: BENIGN COLONIC MUCOSA WITH   MILD ARCHITECTURAL DISTORTION, SEE COMMENT.    3. ENDOSCOPIC BIOPSY, SIGMOID COLON POLYP: HYPERPLASTIC   POLYP(S). NO ADENOMATOUS CHANGE OR MALIGNANCY IDENTIFIED.    4. ENDOSCOPIC BIOPSY, DUODENUM: BENIGN SMALL BOWEL MUCOSA. NO   ACTIVE INFLAMMATION OR  VILLOUS ATROPHY IDENTIFIED.    COMMENT   2. No significant increased lymphoid inflammatory component is   identified. Clinical correlation is essential. (ALL:caf   03/29/04)    cf   Date Reported: 03/29/2004 Arlene L. Almyra Free, MD   *** Electronically Signed Out By ALL ***    Clinical information   R/O lymphoma; R/O celiac disease; anemia (cm)    specimen(s) obtained   1: Colon, polyp(s), cecal   2: Colon, biopsy, sigmoid   3: Colon, polyp(s), sigmoid   4: Duodenum, biopsy    Gross Description   1. Received in formalin are two tan polypoid tissue fragments   which measure 0.4 and 0.7 cm in greatest dimension. The largest   piece is inked and sectioned. The specimen is entirely submitted   in one cassette.    2. Received in formalin are tan, soft tissue fragments that are   submitted in toto. Number: 2   Size: 0.2 and 0.3 cm    3. Received in formalin is a tan, soft tissue fragment that is   submitted in toto. Size: 0.2 cm    4. Received in formalin are tan, soft tissue fragments that are   submitted in toto. Number: multiple   Size: 0.2 to 0.4 cm (GP:caf 03/28/04)    cf/

## 2011-01-09 NOTE — Medication Information (Signed)
Summary: rov/tm  Anticoagulant Therapy  Managed by: Bethena Midget, RN, BSN Referring MD: Dietrich Pates, MD PCP: Oren Bracket Supervising MD: Myrtis Ser MD, Tinnie Gens Indication 1: Atrial Fibrillation Lab Used: [facility] [place] Site: [address] INR POC 2.7 INR RANGE 2-3  Dietary changes: no    Health status changes: no    Bleeding/hemorrhagic complications: no    Recent/future hospitalizations: no    Any changes in medication regimen? yes       Details: Iron PO discontinued now Procrit inj.  increased to 40000 q 2 wks  Recent/future dental: no  Any missed doses?: no       Is patient compliant with meds? yes       Current Medications (verified): 1)  Warfarin Sodium 5 Mg Tabs (Warfarin Sodium) .... Use As Directed By Anticoagulation Clinic 2)  Toprol Xl 100 Mg Tb24 (Metoprolol Succinate) .... 1/2 Tab As Needed 3)  Synthroid 75 Mcg Tabs (Levothyroxine Sodium) .Marland Kitchen.. 1 By Mouth Qd 4)  Ultram 50 Mg  Tabs (Tramadol Hcl) .... 1/2-1 Q 8 - 12 Hours As Needed 5)  Cozaar 50 Mg Tabs (Losartan Potassium) .... Take One Tablet By Mouth Daily 6)  Vitamin C 500 Mg Tabs (Ascorbic Acid) .Marland Kitchen.. 1 Tab By Mouth Once Daily 7)  Oxygen 2 Liters 8)  Colchicine 0.6 Mg  Tabs (Colchicine) .... Take 1 Tablet By Mouth Two Times A Day As Needed 9)  Xopenex Mdi .... 2 Puffs At Breakfast, Lunch, and [employer]. 10)  Omeprazole 40 Mg Cpdr (Omeprazole) .Marland Kitchen.. 1 Tab As Needed 11)  Torsemide 20 Mg Tabs (Torsemide) .... 7 Tablets Once Daily 12)  Anucort-Hc 25 Mg Supp (Hydrocortisone Acetate) .Marland Kitchen.. 1 Rectally At Bedtime 13)  Klor-Con M20 20 Meq Cr-Tabs (Potassium Chloride Crys Cr) .Marland Kitchen.. 1 Tab By Mouth Once Daily 14)  Simvastatin 20 Mg Tabs (Simvastatin) .... Take One Tablet By Mouth Daily At Bedtime 15)  Zaroxolyn 2.5 Mg Tabs (Metolazone) .Marland Kitchen.. 1 Tab Every Other Day Prn 16)  Uloric 40 Mg Tabs (Febuxostat) .Marland Kitchen.. 1 Once Daily 17)  Procrit 16109 Unit/ml Soln (Epoetin Alfa) .... Take 1 Injection Q 2  Weeks.  Allergies: No Known Drug Allergies  Anticoagulation Management History:      The patient is taking warfarin and comes in today for a routine follow up visit.  Positive risk factors for bleeding include an age of 75 years or older, history of CVA/TIA, history of GI bleeding, and presence of serious comorbidities.  The bleeding index is 'high risk'.  Positive CHADS2 values include History of CHF, History of HTN, Age > 75 years old, and Prior Stroke/CVA/TIA.  Her last INR was 2.4 RATIO.  Anticoagulation responsible provider: Myrtis Ser MD, Tinnie Gens.  INR POC: 2.7.  Cuvette Lot#: [id number].  Exp: 04/2011.    Anticoagulation Management Assessment/Plan:      The patient's current anticoagulation dose is Warfarin sodium 5 mg tabs: Use as directed by Anticoagulation Clinic.  The target INR is 2.0-3.0.  The next INR is due 04/27/2010.  Anticoagulation instructions were given to patient.  Results were reviewed/authorized by Bethena Midget, RN, BSN.  She was notified by Bethena Midget, RN, BSN.         Prior Anticoagulation Instructions: INR 2.6 Continue 5mg s daily except 2.5mg s on Mondays, Wednesdays and Fridays. Recheck in 4 weeks.   Current Anticoagulation Instructions: INR 2.7 Continue 5mg s everyday except 2.5mg s on Mondays, Wednesdays and Fridays. Recheck in 4 weeks.

## 2011-01-09 NOTE — Progress Notes (Signed)
Summary: low hg  Phone Note From Other Clinic   Summary of Call: I received a call from [facility] stating that the patient has had a drop in Hg. It has gone down to 7.8. I could bring the patient in for appt here, but there is not a lot we can do if she is possibly bleeding. I recommended that Dr. Russella Dar be called, patient saw him in Oct. and scheduled and egd for Dec. Patient might need the egd appt moved up and possibly have colonoscopy considered also. She will discuss with Dr. Caryn Section and call back to our office if appt is needed. Initial call taken by: Lucious Groves CMA,  October 16, 2010 11:10 AM  Follow-up for Phone Call        please route Phone Note to Dr Ardell Isaacs Nurse; GI F/U indicated due to PMH of gastric ulcer with hemorrhage Follow-up by: Marga Melnick MD,  October 16, 2010 11:26 AM  Additional Follow-up for Phone Call Additional follow up Details #1::        Will forward as FYI. Lucious Groves CMA  October 16, 2010 11:43 AM

## 2011-01-09 NOTE — Medication Information (Signed)
Summary: rov/ln  Anticoagulant Therapy  Managed by: Weston Brass, PharmD Referring MD: Dietrich Pates, MD PCP: Oren Bracket Supervising MD: Juanda Chance MD, Florencio Hollibaugh Indication 1: Atrial Fibrillation Lab Used: [facility] [place] Site: [address] INR POC 2.7 INR RANGE 2-3  Dietary changes: no    Health status changes: no    Bleeding/hemorrhagic complications: no    Recent/future hospitalizations: no    Any changes in medication regimen? no    Recent/future dental: no  Any missed doses?: no       Is patient compliant with meds? yes       Allergies: No Known Drug Allergies  Anticoagulation Management History:      The patient is taking warfarin and comes in today for a routine follow up visit.  Positive risk factors for bleeding include an age of 75 years or older, history of CVA/TIA, history of GI bleeding, and presence of serious comorbidities.  The bleeding index is 'high risk'.  Positive CHADS2 values include History of CHF, History of HTN, Age > 64 years old, and Prior Stroke/CVA/TIA.  Her last INR was 2.4 RATIO.  Anticoagulation responsible provider: Juanda Chance MD, Smitty Cords.  INR POC: 2.7.  Cuvette Lot#: [id number].  Exp: 09/2011.    Anticoagulation Management Assessment/Plan:      The patient's current anticoagulation dose is Warfarin sodium 5 mg tabs: Use as directed by Anticoagulation Clinic.  The target INR is 2.0-3.0.  The next INR is due 08/23/2010.  Anticoagulation instructions were given to patient.  Results were reviewed/authorized by Weston Brass, PharmD.  She was notified by Liana Gerold, PharmD Candidate.         Prior Anticoagulation Instructions: INR 2.3  Continue same dose of 1/2 tab on Monday, Wednesday, and Friday and 1 tab all other days.  Re-check INR in 4 weeks.  Current Anticoagulation Instructions: INR 2.7  Continue 1 tablet daily except 1/2 tablet Mon, Wed and Fri.  Return to clinic in 4 weeks.

## 2011-01-09 NOTE — Letter (Signed)
Summary: [facility] Office Note  [facility] Office Note   Imported By: Roderic Ovens 05/04/2010 12:55:28  _____________________________________________________________________  External Attachment:    Type:   Image     Comment:   External Document

## 2011-01-09 NOTE — Progress Notes (Signed)
Summary: Triage-- Call back  Phone Note Call from Patient Call back at [phone]   Summary of Call: Message was left on Trigae VM:  No name of who was calling. Message states they need a call back because there are at there "whits end" and need advice. Initial call taken by: Army Fossa CMA,  September 18, 2010 8:58 AM  Follow-up for Phone Call        I spoke with patient's daughter- gout in feet in and unable to move x 13 days . I recommended that patient's daughter call EMS and have patient transported to the emergency room Follow-up by: Shonna Chock CMA,  September 18, 2010 9:10 AM     Appended Document: Triage-- Call back Patient daughter called to notify that she has the patient ready and will now call ems to transport her.

## 2011-01-09 NOTE — Procedures (Signed)
Summary: anemia/sheri  Patient here today for capsule endoscopy for Dr.Nickie Deren  .  Pt verbalized understanding of all verbal and written instructions.  Pt tolerated well.  Lot #  [id number]  exp 2013-01 .

## 2011-01-09 NOTE — Miscellaneous (Signed)
Summary: Orders Update  Clinical Lists Changes  Orders: Added new Test order of Carotid Duplex (Carotid Duplex) - Signed 

## 2011-01-09 NOTE — Medication Information (Signed)
Summary: rov/tm  Anticoagulant Therapy  Managed by: Charolotte Eke, PharmD Referring MD: Dietrich Pates, MD PCP: Oren Bracket Supervising MD: Ladona Ridgel MD, Sharlot Gowda Indication 1: Atrial Fibrillation Lab Used: [facility] [place] Site: [address] INR POC 2.1 INR RANGE 2-3  Dietary changes: no    Health status changes: no    Bleeding/hemorrhagic complications: no    Recent/future hospitalizations: no    Any changes in medication regimen? no    Recent/future dental: no  Any missed doses?: no       Is patient compliant with meds? yes       Current Medications (verified): 1)  Warfarin Sodium 5 Mg Tabs (Warfarin Sodium) .... Use As Directed By Anticoagulation Clinic 2)  Toprol Xl 100 Mg Tb24 (Metoprolol Succinate) .... 1/2 Tab As Needed 3)  Synthroid 75 Mcg Tabs (Levothyroxine Sodium) .Marland Kitchen.. 1 By Mouth Qd 4)  Ultram 50 Mg  Tabs (Tramadol Hcl) .... 1/2-1 Q 8 - 12 Hours As Needed 5)  Cozaar 50 Mg Tabs (Losartan Potassium) .... Take One Tablet By Mouth Daily 6)  Vitamin C 500 Mg Tabs (Ascorbic Acid) .Marland Kitchen.. 1 Tab By Mouth Once Daily 7)  Oxygen 2 Liters 8)  Colchicine 0.6 Mg  Tabs (Colchicine) .... Take 1 Tablet By Mouth Two Times A Day As Needed 9)  Xopenex Mdi .... 2 Puffs At Breakfast, Lunch, and [employer]. 10)  Omeprazole 40 Mg Cpdr (Omeprazole) .Marland Kitchen.. 1 Tab As Needed 11)  Torsemide 20 Mg Tabs (Torsemide) .... 7 Tablets Once Daily 12)  Anucort-Hc 25 Mg Supp (Hydrocortisone Acetate) .Marland Kitchen.. 1 Rectally At Bedtime 13)  Klor-Con M20 20 Meq Cr-Tabs (Potassium Chloride Crys Cr) .Marland Kitchen.. 1 Tab By Mouth Once Daily 14)  Simvastatin 20 Mg Tabs (Simvastatin) .... Take One Tablet By Mouth Daily At Bedtime 15)  Zaroxolyn 2.5 Mg Tabs (Metolazone) .Marland Kitchen.. 1 Tab Every Other Day Prn 16)  Uloric 40 Mg Tabs (Febuxostat) .Marland Kitchen.. 1 Once Daily 17)  Procrit 11914 Unit/ml Soln (Epoetin Alfa) .... Take 1 Injection Q 2 Weeks.  Allergies (verified): No Known Drug Allergies  Anticoagulation Management  History:      The patient is taking warfarin and comes in today for a routine follow up visit.  Positive risk factors for bleeding include an age of 16 years or older, history of CVA/TIA, history of GI bleeding, and presence of serious comorbidities.  The bleeding index is 'high risk'.  Positive CHADS2 values include History of CHF, History of HTN, Age > 3 years old, and Prior Stroke/CVA/TIA.  Her last INR was 2.4 RATIO.  Anticoagulation responsible provider: Ladona Ridgel MD, Sharlot Gowda.  INR POC: 2.1.  Cuvette Lot#: [id number].  Exp: 07/11/2011.    Anticoagulation Management Assessment/Plan:      The patient's current anticoagulation dose is Warfarin sodium 5 mg tabs: Use as directed by Anticoagulation Clinic.  The target INR is 2.0-3.0.  The next INR is due 05/30/2010.  Anticoagulation instructions were given to patient.  Results were reviewed/authorized by Charolotte Eke, PharmD.  She was notified by Charolotte Eke, PharmD.         Prior Anticoagulation Instructions: INR 2.7 Continue 5mg s everyday except 2.5mg s on Mondays, Wednesdays and Fridays. Recheck in 4 weeks.   Current Anticoagulation Instructions: The patient is to continue with the same dose of coumadin.  This dosage includes: 5mg  daily except 2.5mg  MWF.

## 2011-01-09 NOTE — Miscellaneous (Signed)
Summary: Notice of Discharge/[facility]  Notice of Discharge/[facility]   Imported By: Lanelle Bal 11/10/2010 11:56:17  _____________________________________________________________________  External Attachment:    Type:   Image     Comment:   External Document

## 2011-01-09 NOTE — Medication Information (Signed)
Summary: rov/tm  Anticoagulant Therapy  Managed by: Cloyde Reams, RN, BSN Referring MD: Dietrich Pates, MD PCP: Oren Bracket Supervising MD: Gala Romney MD, Reuel Boom Indication 1: Atrial Fibrillation Lab Used: [facility] [place] Site: [address] INR POC 2.3 INR RANGE 2-3  Dietary changes: no    Health status changes: no    Bleeding/hemorrhagic complications: yes       Details: 1 episode of hemrroidal bleeding since last OV.  Recent/future hospitalizations: no    Any changes in medication regimen? no    Recent/future dental: no  Any missed doses?: no       Is patient compliant with meds? yes       Allergies (verified): No Known Drug Allergies  Anticoagulation Management History:      The patient is taking warfarin and comes in today for a routine follow up visit.  Positive risk factors for bleeding include an age of 75 years or older, history of CVA/TIA, history of GI bleeding, and presence of serious comorbidities.  The bleeding index is 'high risk'.  Positive CHADS2 values include History of CHF, History of HTN, Age > 82 years old, and Prior Stroke/CVA/TIA.  Her last INR was 2.4 RATIO.  Anticoagulation responsible provider: Shamecka Hocutt MD, Reuel Boom.  INR POC: 2.3.  Cuvette Lot#: [id number].  Exp: 03/2011.    Anticoagulation Management Assessment/Plan:      The patient's current anticoagulation dose is Warfarin sodium 5 mg tabs: Use as directed by Anticoagulation Clinic.  The target INR is 2.0-3.0.  The next INR is due 02/27/2010.  Anticoagulation instructions were given to patient.  Results were reviewed/authorized by Cloyde Reams, RN, BSN.  She was notified by Cloyde Reams RN.         Prior Anticoagulation Instructions: INR 2.5 Continue 5mg s everyday except 2.5mg s on Mondays, Wednesdays and Fridays. Recheck in 4 weeks.   Current Anticoagulation Instructions: INR 2.3  Continue on same dosage 5mg  daily except 2.5mg  on Mondays, Wednesdays, and Fridays.   Recheck in 4 weeks.

## 2011-01-09 NOTE — Progress Notes (Signed)
Summary: Rheum Referral  Phone Note Call from Patient   Caller: Daughter--[phone] Summary of Call: Patient daughter left message on triage that her mother did not understand any info about her referral. Please call her to discuss.  Initial call taken by: Lucious Groves CMA,  November 15, 2010 10:24 AM  Follow-up for Phone Call        Patient has been contacted about her appointment by the Rheumatology office & myself.  I will call her again. Magdalen Spatz [facility]  November 15, 2010 11:21 AM

## 2011-01-09 NOTE — Assessment & Plan Note (Signed)
Summary: hosp f/u internal bleeding/cbs   Vital Signs:  Patient profile:   77 year old female O2 Sat:      98 % on 2 L/min Temp:     97.0 degrees F oral Pulse rate:   63 / minute BP sitting:   122 / 64  (right arm) Cuff size:   regular  Vitals Entered By: Shonna Chock CMA (November 14, 2010 1:49 PM)  O2 Flow:  2 L/min CC: Hospital follow-up, c/o gout concerns and discuss meds ( holding blood thinner due to internal bleeding) , Heartburn Comments Weight not recorded due to patient in wheelchair    Primary Care Provider:  Marga Melnick, MD   CC:  Hospital follow-up, c/o gout concerns and discuss meds ( holding blood thinner due to internal bleeding) , and Heartburn.  History of Present Illness:      This is a 77 year old woman who  follows up for anemia, Hgb was 9.7  two weeks ago.Hgb was 7 pre hospitalization.  The patient reports some  trouble swallowing pills , but denies acid reflux, sour taste in mouth, epigastric pain, and chest pain.  The patient denies the following alarm features: melena, dysphagia, and hematemesis.  Diagnostic studies include endoscopic camera which she swallowed 12/5.    Current Medications (verified): 1)  Synthroid 75 Mcg Tabs (Levothyroxine Sodium) .Marland Kitchen.. 1 By Mouth Qd 2)  Ultram 50 Mg  Tabs (Tramadol Hcl) .... 1/2-1 Q 8 - 12 Hours As Needed 3)  Cozaar 100 Mg Tabs (Losartan Potassium) .... 1/2 Tablet By Mouth Once Daily 4)  Vitamin C 500 Mg Tabs (Ascorbic Acid) .Marland Kitchen.. 1 Tab By Mouth Once Daily 5)  Oxygen 2 Liters 6)  Torsemide 20 Mg Tabs (Torsemide) .... 3 Tabs 2 Times Per Day 7)  Anucort-Hc 25 Mg Supp (Hydrocortisone Acetate) .Marland Kitchen.. 1 Rectally At Bedtime As Needed 8)  Klor-Con M20 20 Meq Cr-Tabs (Potassium Chloride Crys Cr) .... 2 Tabs By Mouth Twice Daily 9)  Simvastatin 20 Mg Tabs (Simvastatin) .... Take One Tablet By Mouth Daily At Bedtime 10)  Zaroxolyn 2.5 Mg Tabs (Metolazone) .Marland Kitchen.. 1 Tab Every Other Day Prn 11)  Procrit 04540 Unit/ml Soln (Epoetin  Alfa) .... Take 1 Injection Q 2 Weeks. 12)  Protonix 40 Mg Tbec (Pantoprazole Sodium) .... Two Times A Day With Meals 13)  Tylenol 325 Mg Tabs (Acetaminophen) .... Every 4 Hours As Needed For Pain 14)  Xopenex Hfa 45 Mcg/act Aero (Levalbuterol Tartrate) .... 2 Puffs Three Times A Day 15)  Metoprolol Tartrate 50 Mg Tabs (Metoprolol Tartrate) .... One Tablet By Mouth Once Daily 16)  Allopurinol 100 Mg Tabs (Allopurinol) .... Takes 200mg  By Mouth Once Daily  Allergies (verified): No Known Drug Allergies  Review of Systems MS:  Gout in both feet has improved . She can not afford Colcrys ($90/ mo) or Uloric ( $48/month). She is on Allopurinol 100 mg 2 once daily .  Physical Exam  General:  Wheelchair bound, on O2 Lungs:  Lungs are clear to auscultation, no crackles or wheezes. BS  decreased w/o increased WOB Heart:  Normal rate and regular rhythm. S1 and S2 normal without gallop, murmur, click, rub . Distant heart sounds Pulses:  R and L dorsalis pedis and posterior tibial pulses are cecreased Extremities:  Good naihealt, stasis hyperpigmentation & venous spiders /small varicosities over feet Neurologic:  alert & oriented X3.     Impression & Recommendations:  Problem # 1:  G I BLEED (ICD-578.9)  camera  results pending  Problem # 2:  RENAL FAILURE (ICD-586)  Rheumatology Referral (Rheumatology)  Problem # 3:  GOUT (ICD-274.9)  unable to afford Cocrys & Uloric Her updated medication list for this problem includes:    Allopurinol 100 Mg Tabs (Allopurinol) .Marland Kitchen... Takes 200mg  by mouth once daily   Rheumatology Referral (Rheumatology)  Complete Medication List: 1)  Synthroid 75 Mcg Tabs (Levothyroxine sodium) .Marland Kitchen.. 1 by mouth qd 2)  Ultram 50 Mg Tabs (Tramadol hcl) .... 1/2-1 q 8 - 12 hours as needed 3)  Cozaar 100 Mg Tabs (Losartan potassium) .... 1/2 tablet by mouth once daily 4)  Vitamin C 500 Mg Tabs (Ascorbic acid) .Marland Kitchen.. 1 tab by mouth once daily 5)  Oxygen 2 Liters  6)   Torsemide 20 Mg Tabs (Torsemide) .... 3 tabs 2 times per day 7)  Anucort-hc 25 Mg Supp (Hydrocortisone acetate) .Marland Kitchen.. 1 rectally at bedtime as needed 8)  Klor-con M20 20 Meq Cr-tabs (Potassium chloride crys cr) .... 2 tabs by mouth twice daily 9)  Simvastatin 20 Mg Tabs (Simvastatin) .... Take one tablet by mouth daily at bedtime 10)  Zaroxolyn 2.5 Mg Tabs (Metolazone) .Marland Kitchen.. 1 tab every other day prn 11)  Procrit 60630 Unit/ml Soln (Epoetin alfa) .... Take 1 injection q 2 weeks. 12)  Protonix 40 Mg Tbec (Pantoprazole sodium) .... Two times a day with meals 13)  Tylenol 325 Mg Tabs (Acetaminophen) .... Every 4 hours as needed for pain 14)  Xopenex Hfa 45 Mcg/act Aero (Levalbuterol tartrate) .... 2 puffs three times a day 15)  Metoprolol Tartrate 50 Mg Tabs (Metoprolol tartrate) .... One tablet by mouth once daily 16)  Allopurinol 100 Mg Tabs (Allopurinol) .... Takes 200mg  by mouth once daily  Other Orders: Venipuncture (16010)  Patient Instructions: 1)  Please add Uric Acid to labs drawn 12/7 as ordered by Dr Fox(Code : 274.9). Call me [phone]) if there is an issue with this order. WF Alwyn Ren MD    Orders Added: 1)  Est. Patient Level III [32202] 2)  Venipuncture [54270] 3)  Rheumatology Referral [Rheumatology]

## 2011-01-09 NOTE — Medication Information (Signed)
Summary: [name]   Anticoagulant Therapy  Managed by: Bethena Midget, RN, BSN Referring MD: Dietrich Pates, MD PCP: Oren Bracket Supervising MD: Tenny Craw MD, Gunnar Fusi Indication 1: Atrial Fibrillation Lab Used: [facility] [place] Site: [address] INR POC 2.6 INR RANGE 2-3  Dietary changes: no    Health status changes: no    Bleeding/hemorrhagic complications: no    Recent/future hospitalizations: no    Any changes in medication regimen? yes       Details: Dr. Caryn Section yesterday decreased Coozar down from 100mg  to 50mg s, and also discontinued Iron.   Recent/future dental: no  Any missed doses?: no       Is patient compliant with meds? yes      Comments: Saw Dr. Tenny Craw today.   Allergies: No Known Drug Allergies  Anticoagulation Management History:      The patient is taking warfarin and comes in today for a routine follow up visit.  Positive risk factors for bleeding include an age of 75 years or older, history of CVA/TIA, history of GI bleeding, and presence of serious comorbidities.  The bleeding index is 'high risk'.  Positive CHADS2 values include History of CHF, History of HTN, Age > 83 years old, and Prior Stroke/CVA/TIA.  Her last INR was 2.4 RATIO.  Anticoagulation responsible provider: Tenny Craw MD, Gunnar Fusi.  INR POC: 2.6.  Cuvette Lot#: [id number].  Exp: 04/2011.    Anticoagulation Management Assessment/Plan:      The patient's current anticoagulation dose is Warfarin sodium 5 mg tabs: Use as directed by Anticoagulation Clinic.  The target INR is 2.0-3.0.  The next INR is due 03/31/2010.  Anticoagulation instructions were given to patient.  Results were reviewed/authorized by Bethena Midget, RN, BSN.  She was notified by Bethena Midget, RN, BSN.         Prior Anticoagulation Instructions: INR 2.3  Continue on same dosage 5mg  daily except 2.5mg  on Mondays, Wednesdays, and Fridays.  Recheck in 4 weeks.    Current Anticoagulation Instructions: INR 2.6 Continue 5mg s daily  except 2.5mg s on Mondays, Wednesdays and Fridays. Recheck in 4 weeks.

## 2011-01-09 NOTE — Procedures (Signed)
Summary: Upper Endoscopy  Patient: [name] Note: All result statuses are Final unless otherwise noted.  Tests: (1) Upper Endoscopy (EGD)   EGD Upper Endoscopy       DONE     [facility]     [address] [place], [place]  [address]           ENDOSCOPY PROCEDURE REPORT           PATIENT:  [name], [name]  MR#:  [id number]     BIRTHDATE:  Jan 07, 1929, 81 yrs. old  GENDER:  female     ENDOSCOPIST:  Judie Petit T. Russella Dar, MD, [facility]           PROCEDURE DATE:  10/20/2010     PROCEDURE:  EGD for control of bleeding, EGD with biopsy, 43239     ASA CLASS:  Class III     INDICATIONS:  hemoccult positive stool, anemia     MEDICATIONS:  Fentanyl 35 mcg IV, Versed 3 mg IV     TOPICAL ANESTHETIC:  Cetacaine Spray     DESCRIPTION OF PROCEDURE:   After the risks benefits and     alternatives of the procedure were thoroughly explained, informed     consent was obtained.  The Pentax Gastroscope [id number] endoscope     was introduced through the mouth and advanced to the second     portion of the duodenum, without limitations.  The instrument was     slowly withdrawn as the mucosa was fully examined.     <<PROCEDUREIMAGES>>     An ulcer was found in the antrum. It was clean based, benign     appearing and non-bleeding. It was 5 mm in size. Multiple biopsies     were obtained and sent to pathology.  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 It was non-bleeding. It was 3 mm in size. Argon Plasma     Coagulator with ablation. The esophagus and gastroesophageal     junction were completely normal in appearance. Otherwise the     examination was normal. Retroflexed views revealed a small hiatal     hernia.   The scope was then withdrawn from the patient and the     procedure completed.           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 Russella Dar, MD, Clementeen Graham           CC:  Pecola Lawless, MD, Pricilla Riffle, MD           n.     Rosalie DoctorVenita Lick. Stark at 10/20/2010 09:04 AM           Mickle Plumb, [id number]  Note: An exclamation mark (!) indicates a result that was not dispersed into the flowsheet. Document Creation Date: 10/20/2010 9:05 AM _______________________________________________________________________  (1) Order result status:  Final Collection or observation date-time: 10/20/2010 08:58 Requested date-time:  Receipt date-time:  Reported date-time:  Referring Physician:   Ordering Physician: Claudette Head [phone]) Specimen Source:  Source: Launa Grill Order Number: [id number] Lab site:   Appended Document: Upper Endoscopy change follow up EGD to mid January from mid December  Appended Document: Upper Endoscopy No answer and no voicemail. Will try again later.  Appended Document: Upper Endoscopy EGD rescheduled to 12/26/2010. Went over new instruction times for patient and she verbalized understanding.

## 2011-01-09 NOTE — Miscellaneous (Signed)
  Clinical Lists Changes  Medications: Changed medication from KLOR-CON M20 20 MEQ CR-TABS (POTASSIUM CHLORIDE CRYS CR) 1 tab by mouth twice daily to KLOR-CON M20 20 MEQ CR-TABS (POTASSIUM CHLORIDE CRYS CR) 2 tabs by mouth twice daily Changed medication from TORSEMIDE 20 MG TABS (TORSEMIDE) 7 tablets once daily to TORSEMIDE 20 MG TABS (TORSEMIDE) 3 tabs 2 times per day

## 2011-01-09 NOTE — Progress Notes (Signed)
Summary: referral - orthopedic   Phone Note Call from Patient Call back at Home Phone [phone] Call back at [phone]   Caller: Daughter- deborah  Reason for Call: Talk to Nurse, Referral Details for Reason: need the name of the referral  - orthopedic that pr suggest to pt.  Summary of Call:   Initial call taken by: Lorne Skeens,  August 17, 2010 9:48 AM  Follow-up for Phone Call        SPOKE WITH DAUGHTER WILL FORWARD TO DR ROSS FOR REVIEW ONCE RESPONSE GIVEN WILL CALL BACK WITH INFO.DAUGHTER VERBALIZED UNDERSTANDING. Follow-up by: Scherrie Bateman, LPN,  August 17, 2010 10:07 AM  Additional Follow-up for Phone Call Additional follow up Details #1::        I am not sure who I was talking about.  I did not dictate discussion.  Ques which jt problem Patient just d/c'd from GI bleed.   Make sure she is f/u with someone  Additional Follow-up by: Sherrill Raring, MD, [facility],  August 27, 2010 2:19 PM     Appended Document: referral - orthopedic Appointment with Dr.Hopper 9/26, Cardiology PA 10/5, Dr.Stark 10/7.

## 2011-01-09 NOTE — Progress Notes (Signed)
Summary: Syncope   Phone Note From Other Clinic   Caller: Clydie Braun [phone] Call For: Dr Russella Dar Summary of Call: Patient  at Short Stay getting 2 units of blood.  Clydie Braun reports patient is hypotensive and looks like she will pass out. Has had one unit of PRBC so far.  I spoke with Mike Gip PA and she called the unit patient has had 2 syncopal episodes since arrival in their depratment.  Per Mike Gip PA she ordered  patient  sent to the ER for eval. Initial call taken by: Darcey Nora RN, CGRN,  October 19, 2010 10:23 AM  Follow-up for Phone Call        Agree Follow-up by: Meryl Dare MD Clementeen Graham,  October 19, 2010 11:09 AM

## 2011-01-09 NOTE — Letter (Signed)
Summary: [facility]  [facility]   Imported By: Lanelle Bal 04/26/2010 10:07:47  _____________________________________________________________________  External Attachment:    Type:   Image     Comment:   External Document

## 2011-01-09 NOTE — Medication Information (Signed)
Summary: Coumadin Clinic   Anticoagulant Therapy  Managed by: Inactive Referring MD: Dietrich Pates, MD PCP: Marga Melnick, MD  Supervising MD: Shirlee Latch MD, [place] Indication 1: Atrial Fibrillation Lab Used: [facility] [place] Site: [address] INR RANGE 2-3          Comments: Per Dr. Tenny Craw, pt no longer Coumadin candidate due to GI bleeds.  Once able to restart anticoagulation, should use ASA.   Allergies: No Known Drug Allergies  Anticoagulation Management History:      Positive risk factors for bleeding include an age of 75 years or older, history of CVA/TIA, history of GI bleeding, and presence of serious comorbidities.  The bleeding index is 'high risk'.  Positive CHADS2 values include History of CHF, History of HTN, Age > 75 years old, and Prior Stroke/CVA/TIA.  Her last INR was 2.4 RATIO.  Anticoagulation responsible provider: Shirlee Latch MD, [place].  Exp: 11/2011.    Anticoagulation Management Assessment/Plan:      The target INR is 2.0-3.0.  The next INR is due 10/20/2010.  Anticoagulation instructions were given to patient.  Results were reviewed/authorized by Inactive.         Prior Anticoagulation Instructions: INR 2.9  No coumadin until otherwise directed  eat green vegies  recheck INR on Fri

## 2011-01-09 NOTE — Medication Information (Signed)
Summary: rov-tp  Anticoagulant Therapy  Managed by: Weston Brass, PharmD Referring MD: Dietrich Pates, MD PCP: Oren Bracket Supervising MD: Riley Kill MD, Maisie Fus Indication 1: Atrial Fibrillation Lab Used: [facility] [place] Site: [address] INR POC 2.4 INR RANGE 2-3  Dietary changes: no    Health status changes: no    Bleeding/hemorrhagic complications: no    Recent/future hospitalizations: no    Any changes in medication regimen? no    Recent/future dental: no  Any missed doses?: no       Is patient compliant with meds? yes       Allergies: No Known Drug Allergies  Anticoagulation Management History:      The patient is taking warfarin and comes in today for a routine follow up visit.  Positive risk factors for bleeding include an age of 75 years or older, history of CVA/TIA, history of GI bleeding, and presence of serious comorbidities.  The bleeding index is 'high risk'.  Positive CHADS2 values include History of CHF, History of HTN, Age > 88 years old, and Prior Stroke/CVA/TIA.  Her last INR was 2.4 RATIO.  Anticoagulation responsible provider: Riley Kill MD, Maisie Fus.  INR POC: 2.4.  Cuvette Lot#: [id number].  Exp: 08/2011.    Anticoagulation Management Assessment/Plan:      The patient's current anticoagulation dose is Warfarin sodium 5 mg tabs: Use as directed by Anticoagulation Clinic.  The target INR is 2.0-3.0.  The next INR is due 06/27/2010.  Anticoagulation instructions were given to patient.  Results were reviewed/authorized by Weston Brass, PharmD.  She was notified by Weston Brass PharmD.         Prior Anticoagulation Instructions: The patient is to continue with the same dose of coumadin.  This dosage includes: 5mg  daily except 2.5mg  MWF.  Current Anticoagulation Instructions: INR 2.4  Continue same dose of 1 tablet every day except 1/2 tablet on Monday, Wednesday and Friday.

## 2011-01-11 NOTE — Procedures (Addendum)
Summary: Upper Endoscopy  Patient: Chloris Marcoux Note: All result statuses are Final unless otherwise noted.  Tests: (1) Upper Endoscopy (EGD)   EGD Upper Endoscopy       DONE     [facility]     [address]     [place], [place]  [address]           ENDOSCOPY PROCEDURE REPORT     PATIENT:  Alta, Goding  MR#:  [id number]     BIRTHDATE:  December 24, 1928, 81 yrs. old  GENDER:  female     ENDOSCOPIST:  Judie Petit T. Russella Dar, MD, [facility]           PROCEDURE DATE:  12/26/2010     PROCEDURE:  EGD with biopsy, [id number]     ASA CLASS:  Class III     INDICATIONS:  follow-up of gastric ulcer     MEDICATIONS:  Fentanyl 12.5 mcg IV, Versed 3 mg IV     TOPICAL ANESTHETIC:  Exactacain Spray     DESCRIPTION OF PROCEDURE:   After the risks benefits and     alternatives of the procedure were thoroughly explained, informed     consent was obtained.  The LB GIF-H180 [id number] endoscope was     introduced through the mouth and advanced to the third portion of     the duodenum, without limitations.  The instrument was slowly     withdrawn as the mucosa was fully examined.     <<PROCEDUREIMAGES>>     Moderate gastritis was found in the antrum, prepyloric area. It     was focal, erosive, erythematous and friable. Multiple biopsies     were obtained and sent to pathology.  Otherwise normal examination     of the stomach.  The esophagus and gastroesophageal junction were     completely normal in appearance.  The duodenal bulb was normal in     appearance, as was the postbulbar duodenum.  Retroflexed views     revealed a hiatal hernia, small. The scope was then withdrawn from     the patient and the procedure completed.     COMPLICATIONS:  None           ENDOSCOPIC IMPRESSION:     1) Moderate gastritis in the antrum     2) A hiatal hernia     RECOMMENDATIONS:     1) Await pathology results     2) avoid NSAIDs     3) PPI bid long term     4) OK to resume Coumadin from a GI perspective with close  observation and an understanding that she is at risk for bleeding     from gastritis, AVMs or hemorrhoids-defer to Drs. Hopper and [employer]     on [employer]. Russella Dar, MD, Clementeen Graham           CC: Marga Melnick, MD   Dietrich Pates, MD           n.     Rosalie Doctor:   Venita Lick. Gauri Galvao at 12/26/2010 10:26 AM           Mickle Plumb, [id number]  Note: An exclamation mark (!) indicates a result that was not dispersed into the flowsheet. Document Creation Date: 12/26/2010 10:26 AM _______________________________________________________________________  (1) Order result status: Final Collection or observation date-time: 12/26/2010 10:16 Requested date-time:  Receipt date-time:  Reported date-time:  Referring Physician:   Ordering Physician:  Claudette Head [id number]) Specimen Source:  Source: Launa Grill Order Number: [id number] Lab site:

## 2011-01-11 NOTE — Discharge Summary (Signed)
Summary: Discharge Summary    NAME:  [name], [name] NO.:  [id number]      MEDICAL RECORD NO.:  [id number]          PATIENT TYPE:  INP      LOCATION:  1418                         FACILITY:  [facility]      PHYSICIAN:  Marinda Elk, M.D.DATE OF BIRTH:  May 31, 1929      DATE OF ADMISSION:  10/19/2010   DATE OF DISCHARGE:  10/22/2010                                  DISCHARGE SUMMARY         PRIMARY CARE DOCTOR:  Titus Dubin. Alwyn Ren, MD,FACP, [facility]      GASTROENTEROLOGIST:  Dr. Claudette Head.      CARDIOLOGIST:  Pricilla Riffle, MD, [facility]      DISCHARGE DIAGNOSES:   1. Acute gastrointestinal bleed, presumed source of 5-mm ulcer in the       antrum.   2. Prerenal azotemia most likely secondary to acute gastrointestinal       bleed versus dehydration.   3. Atrial fibrillation.   4. Hypokalemia.      DISCHARGE MEDICATIONS:   1. Allopurinol 200 mg 1 tablet daily.   2. Colchicine 0.6 mg gout b.i.d. as needed.   3. Cozaar 100 mg half tablet daily.   4. Hydrocortisone 1 suppository at bedtime daily as needed.   5. Metoprolol 50 mg half tablet daily.   6. K-Dur 20 mEq daily.   7. Procrit 4000 units weekly.   8. Protonix 40 mg b.i.d.   9. Simvastatin 20 mg daily.   10.Synthroid 75 mcg daily.   11.Torsemide 3-4 tablets b.i.d.   12.Tylenol 1000 mg q.6 h. p.r.n.   13.Tramadol 50 mg q.8-12 h. p.r.n.   14.Vitamin C 500 mg daily.   15.Xopenex 2 puffs t.i.d. inhale.   16.Zaroxolyn 2.5 mg every other day.      PROCEDURES PERFORMED:  EGD that showed a 5-mm nonbleeding ulcer, 3-mm   AVM, status post cauterization.  Path pending.  Colonoscopy that showed   multiple polyps, internal and external hemorrhoids, multiple   diverticula, descending colon lipoma.      BRIEF ADMITTING HISTORY AND PHYSICAL:  This 77 year old female with past   medical history of hypertension, AFib on Coumadin, gout, hypothyroidism,   obstructive sleep apnea, and pulmonary hypertension who was seen  by Dr.   Anselm Jungling, for some blood work, her hemoglobin at the operating room 7.5,   subsequently called and asked come to the ED for transfusion.  While in   the ED, the patient had to go to use a bedside commode, had blood in   stools, and presyncopal episode.  So the hospital service was consulted   for further evaluation.      PHYSICAL EXAMINATION:  VITAL SIGNS:  Blood pressure 125/45 heart rate   60, respirations 16, temperature 97, and satting 99% on 2 L.   GENERAL:  No acute distress.   HEENT: Normocephalic and atraumatic.  Pupils equally round, reactive to   light.   NECK:  Supple.  No JVD.  No carotid bruits.   LUNGS:  Good air movement.  Clear to auscultation.   CARDIOVASCULAR:  Regular rate and rhythm with positive S1 and S2.  No   murmurs, rubs, or gallops.   ABDOMEN:  Positive bowel sounds, nontender, nondistended, soft.   EXTREMITIES:  Positive pulses, no clubbing, cyanosis, or edema.   NEUROLOGIC:  Nonfocal.      LABS ON ADMISSION:  Sodium 142, potassium 3.8, chloride 105, bicarb of   27, glucose of 120, BUN of 99, creatinine of 2.2, calcium 9.3, total   protein is 6.3, albumin of 3.3, alkaline phosphatase 82, bilirubin 0.6,   AST 26, ALT 79, and INR 1.9.  Hemoglobin yesterday 7.6, hemoglobin 5.7,   and platelet count of 197.  Renal ultrasound showed no evidence of   hydronephrosis, slightly increased in the left upper lobe pole renal   cyst.  Chest x-ray showed no chronic lung changes, no overlying   pulmonary processes, and some pulmonary vascular congestion.      BRIEF HOSPITAL COURSE:   1. The patient was admitted to the floor, was typed and crossed and       transfused, status post EGD with results above.  Biopsy result is       pending.  Colonoscopy was done that showed results above.  Her       Coumadin was stopped.  She will start her Coumadin once she sees       Dr. Anselm Jungling.  She has had multiple admissions for GI bleeding and       Coumadin, it would be wise to  evaluate by her GI doctor if she       needs to be on Coumadin.  She will have a followup with her       gastroenterologist in 2 weeks to further evaluate and discuss this.       Hemoglobin at the time of discharge was 8.3 which is stable.  She       will continue her Procrit.  And try to avoid NSAIDS.   2. Prerenal azotemia, question secondary to upper GI bleed versus       dehydration.  She was given IV fluids.  Her creatinine improved,       but her ratio on admission for her BUN and creatinine was more than       41, so probably a component of upper GI bleed.   3. AFib, currently rate controlled.  Continue to hold Coumadin, will       be started by her gastroenterologist.   4. Hyperkalemia that is probably secondary to halting her potassium       supplementation, she was restarted on this.      DISPOSITION:  The patient will follow up with Dr. Anselm Jungling in 2-4 weeks.   He will discuss the risk and benefit of being on Coumadin and GI bleed.   Also follow up with her primary care doctor, Dr. Alwyn Ren, an outpatient   to titrate   her blood pressure medications as needed.  On day of discharge,   temperature 97, pulse of 83, respirations 16, blood pressure 126/56, and   she was satting 97% on 1 L.  Labs on day of discharge shows a hemoglobin   of 8.3.  She has had no further bowel movement and no further bleeding.               Marinda Elk, M.D.               AF/MEDQ  D:  10/22/2010  T:  10/22/2010  Job:  [id number]      cc:   Dr. Lorelle Formosa. Alwyn Ren, MD,FACP,FCCP   [phone] [place]   [place] [address]      Pricilla Riffle, MD, [facility]   [address]  [address]   [place]   [place] [address]      Electronically Signed by Lambert Keto M.D. on 10/25/2010 08:28:49 AM

## 2011-01-11 NOTE — Letter (Signed)
Summary: [facility]   Imported By: Lester Gideon 12/14/2010 09:49:53  _____________________________________________________________________  External Attachment:    Type:   Image     Comment:   External Document

## 2011-01-11 NOTE — Assessment & Plan Note (Signed)
Summary: Abdominal Pain   History of Present Illness Visit Type: Follow-up Visit Primary GI MD: Elie Goody MD [facility] Primary Sonny Anthes: Marga Melnick, MD  Requesting Colonel Krauser: na Chief Complaint: abdominal pain x 1 week History of Present Illness:   Patient is a 77 year old  female with multiple medical problems.  She is followed by Dr. Russella Dar for history of colon polyps and PUD. She was hospitalized in October and November 2011 for UGIB. Both times, EGD revealed PUD. The first EGD had several antral ulcers and the second had one small, healing antral ulcer. She is scheduled for EGD in January to document healing.  Patient is here with her family for evaluation of abdominal pain. After eating breakfast last Sunday (8 days ago) her stomach started "paining".  She subsequently had two episodes of vomiting (nonbloody). Pain started in epigastrium but eventually became more generalized. It was constant, lasted all day. Took pain medication and slept through the night but woke up on Monday with recurrent pain.  Pain persisted over next few days, subsided for two days but then had recurred yesterday after breakfast and again after lunch. She didn't eat dinner last night but ate breakfast this morning  and so far no pain.  Patient tells me she doesn't usually have problems with abdominal pain. Patient has seen small amount of BRB on tissue recently. Bowel movements are okay except for occasional straining.    GI Review of Systems    Reports abdominal pain, bloating, loss of appetite, and  nausea.     Location of  Abdominal pain: epigastric area.    Denies acid reflux, belching, chest pain, dysphagia with liquids, dysphagia with solids, heartburn, vomiting, vomiting blood, weight loss, and  weight gain.      Reports constipation, diarrhea, hemorrhoids, and  rectal bleeding.     Denies anal fissure, black tarry stools, change in bowel habit, diverticulosis, fecal incontinence, heme positive stool,  irritable bowel syndrome, jaundice, light color stool, liver problems, and  rectal pain.   Current Medications (verified): 1)  Synthroid 75 Mcg Tabs (Levothyroxine Sodium) .Marland Kitchen.. 1 By Mouth Qd 2)  Ultram 50 Mg  Tabs (Tramadol Hcl) .... 1/2-1 Q 8 - 12 Hours As Needed 3)  Cozaar 100 Mg Tabs (Losartan Potassium) .... 1/2 Tablet By Mouth Once Daily 4)  Vitamin C 500 Mg Tabs (Ascorbic Acid) .Marland Kitchen.. 1 Tab By Mouth Once Daily 5)  Oxygen 2 Liters 6)  Torsemide 20 Mg Tabs (Torsemide) .... 3 Tabs 2 Times Per Day 7)  Anucort-Hc 25 Mg Supp (Hydrocortisone Acetate) .Marland Kitchen.. 1 Rectally At Bedtime As Needed 8)  Klor-Con M20 20 Meq Cr-Tabs (Potassium Chloride Crys Cr) .... 2 Tabs By Mouth Twice Daily 9)  Simvastatin 20 Mg Tabs (Simvastatin) .... Take One Tablet By Mouth Daily At Bedtime 10)  Zaroxolyn 2.5 Mg Tabs (Metolazone) .Marland Kitchen.. 1 Tab Every Other Day Prn 11)  Procrit 04540 Unit/ml Soln (Epoetin Alfa) .... Take 1 Injection Q 2 Weeks. 12)  Protonix 40 Mg Tbec (Pantoprazole Sodium) .... Two Times A Day With Meals 13)  Tylenol 325 Mg Tabs (Acetaminophen) .... Every 4 Hours As Needed For Pain 14)  Xopenex Hfa 45 Mcg/act Aero (Levalbuterol Tartrate) .... 2 Puffs Three Times A Day 15)  Metoprolol Succinate 50 Mg Xr24h-Tab (Metoprolol Succinate) .... Take 1/2 Tablet By Mouth Daily 16)  Allopurinol 100 Mg Tabs (Allopurinol) .... Takes 200mg  By Mouth Once Daily 17)  Prednisone 5 Mg Tabs (Prednisone) .... Once Daily  Allergies (verified): No  Known Drug Allergies  Past History:  Past Medical History: Reviewed history from 09/25/2010 and no changes required. Restrictive cardiomyopathy AF 1996,S/P cardioversion;  CVA 1999; COAD exacerbation 2007; sleep apnea/CPAP ([address]) ANEMIA NOS (ICD-285.9), iron deficiency  GOUT (ICD-274.9) PEPTIC ULCER, ACUTE, HEMORRHAGE, HX OF (ICD-V12.71), 2005, 2011 HYPOTHYROIDISM (ICD-244.9) HYPERTENSION (ICD-401.9) Diastolic Dysfunction Hx adenomatous colon polyps  03/2004 GERD with hiatal  hernia Diverticulosis Anxiety Disorder Arthritis Hemorrhoids-internal and external COPD MORBID OBESITY CAD, ONE VESSEL, BY CATH 08/06/08 EJECTION FRACTION OVERALL PRESERVED BY ECHO 04/30/08 SUPPLEMENTAL HOME OXYGEN Renal failure, Dr Caryn Section Hyperlipidemia  Past Surgical History: Reviewed history from 09/25/2010 and no changes required. Appendectomy Oophorectomy Incision and drainage of cellulitis/umbilical abcess- 5/09; Cath : pulmonary HTN, non obstructive CAD Hysterectomy  Family History: Reviewed history from 10/31/2009 and no changes required. heart disease: father MI; brother CABG, 5 vessel clotting disorders: brother rheumatism: brother cancer: mother (breast) sister(breast) No FH of Colon Cancer:  Social History: Reviewed history from 09/25/2010 and no changes required. pt is retired. worked at [employer] married one child pt never smoked  Alcohol Use - no Patient does not get regular exercise, Oxygen dependent.  Daily Caffeine Use: one daily   Review of Systems  The patient denies allergy/sinus, anemia, anxiety-new, arthritis/joint pain, back pain, blood in urine, breast changes/lumps, change in vision, confusion, cough, coughing up blood, depression-new, fainting, fatigue, fever, headaches-new, hearing problems, heart murmur, heart rhythm changes, itching, menstrual pain, muscle pains/cramps, night sweats, nosebleeds, pregnancy symptoms, shortness of breath, skin rash, sleeping problems, sore throat, swelling of feet/legs, swollen lymph glands, thirst - excessive , urination - excessive , urination changes/pain, urine leakage, vision changes, and voice change.    Vital Signs:  Patient profile:   77 year old female Height:      59 inches Weight:      152 pounds BMI:     30.81 Pulse rate:   80 / minute Pulse rhythm:   irregularly irregular BP sitting:   116 / 50  (left arm) Cuff size:   regular  Vitals Entered By: June McMurray CMA Duncan Dull) (November 27, 2010 1:24 PM)  Physical Exam  General:  Pleasant, obese white female. Head:  Normocephalic and atraumatic. Eyes:  Conjunctiva pink, no icterus.  Mouth:  No oral lesions. Tongue moist.  Neck:  no obvious masses  Lungs:  Clear throughout to auscultation. Heart:  Regular rate and rhythm; no murmurs, rubs,  or bruits. Abdomen:  Abdomen soft, nontender, nondistended. No obvious masses or hepatomegaly.Normal bowel sounds.  Msk:  Symmetrical with no gross deformities. Normal posture. Extremities:  No palmar erythema, no edema.  Neurologic:  Alert and  oriented x4;  grossly normal neurologically. Skin:  Intact without significant lesions or rashes. Cervical Nodes:  No significant cervical adenopathy. Psych:  Alert and cooperative. Normal mood and affect.  Impression & Recommendations:  Problem # 1:  ABDOMINAL PAIN -GENERALIZED (ICD-789.07) Assessment New Eight day history of abdominal pain exacerbated by meals. She has known history of a small, healing antral ulcer but doesn't usually have abdominal pain. Rule out biliary disease. Mesenteric ischemia is possible. For now will check some basic labs as well an an U/S of the abdomen. See #2. Patient will be called with test results and any further recommendations based on those results. Continue PPI.  Orders: TLB-Hepatic/Liver Function Pnl (80076-HEPATIC) TLB-Amylase (82150-AMYL) TLB-Lipase (83690-LIPASE) Ultrasound Abdomen (UAS)  Problem # 2:  NAUSEA AND VOMITING (ICD-787.01) Assessment: New Only two episodes of vomiting and that was at onset  of pain but she continues to have some ongoing nausea. See. #2.  Orders: TLB-Hepatic/Liver Function Pnl (80076-HEPATIC) TLB-Amylase (82150-AMYL) TLB-Lipase (83690-LIPASE) Ultrasound Abdomen (UAS)  Problem # 3:  GASTR ULCR UNS ACUT/CHRN W/O HEMOR PERF/OBST (ICD-531.90) Assessment: Comment Only Scheduled for EGD in January to document antal ulcer healing. Continue PPI. Her Coumadin has been on  hold since hospital discharge. According to 10/23/10 note in EMR from pharmacist Weston Brass at Coumadin clinic, Dr. Tenny Craw has discontinued Coumadin but would like patient on ASA when cleared by GI.  She is for repeat EGD 12/26/09 to document ulcer healing. Recommendations regarding ASA use can be made at that time depending on EGD findings.  Problem # 4:  BLOOD IN STOOL (ICD-578.1) Assessment: Comment Only Two recent UGIB, antral ulcers found on each EGD. Adenomatous colon polyps and hemorrhoids on colonoscopy during November 2011. Patient had just had capsule endoscopy and preliminary results reveal a couple of small bowel AVMs.   Patient Instructions: 1)  Please go to lab, basement level. 2)  We have scheduled the Ultrasound at [facility] on 11-29-2010. 3)  Directions given.  We will call you with the results. 4)  Copy sent to : Marga Melnick, MD 5)  The medication list was reviewed and reconciled.  All changed / newly prescribed medications were explained.  A complete medication list was provided to the patient / caregiver.

## 2011-01-11 NOTE — Letter (Signed)
Summary: [facility] Patient Note   [facility] Patient Note   Imported By: Roderic Ovens 01/03/2011 13:43:54  _____________________________________________________________________  External Attachment:    Type:   Image     Comment:   External Document

## 2011-01-11 NOTE — Progress Notes (Signed)
Summary: Abd Pain   Phone Note Call from Patient   Caller: Stanton Kidney -daughter [phone] Call For: Dr Russella Dar Summary of Call: Still having alot of abd pain. doesnt know what to do for her. Initial call taken by: Leanor Kail [facility],  November 27, 2010 8:13 AM  Follow-up for Phone Call        Spoke with daughter, she states patient is having abdominal pain in the upper part of her stomach. States she has had pain for 8 days, does not know what to do for her mother. Appointment made to see Willette Cluster, RNP today at1:30pm. Follow-up by: Selinda Michaels RN,  November 27, 2010 9:02 AM

## 2011-01-11 NOTE — Assessment & Plan Note (Signed)
Summary: PER CHECK OUT/SF  Medications Added TORSEMIDE 20 MG TABS (TORSEMIDE) 6 tablets daily KLOR-CON M20 20 MEQ CR-TABS (POTASSIUM CHLORIDE CRYS CR) 1 tab 3 tomes per day METOPROLOL SUCCINATE 50 MG XR24H-TAB (METOPROLOL SUCCINATE) Take 1/4 th of a tablet by mouth twice a day      Allergies Added: NKDA  Visit Type:  Follow-up Referring Provider:  na Primary Provider:  Marga Melnick, MD   CC:  Sob.  History of Present Illness: patient is a 77 year old with a history of a restrictive cardiomyopathy, atrial fibrillaton, CHF, renal insuff, gout, GI bleeding and diabetes.  I last saw her in October.  SInce seen her she has been adimtted once with GI bleeding.  She is off coumadin. Currently she denies signif SOB at rest.  She does give out with activity.  Neck does not feel full. She deneis chest pressure.  No signif edema.   did have abd USN and a HIDA scan as part of GI work up.  HAs not heard results of HIDA scan.  Current Medications (verified): 1)  Levothyroxine Sodium 75 Mcg Tabs (Levothyroxine Sodium) .Marland Kitchen.. 1 By Mouth Once Daily 2)  Ultram 50 Mg  Tabs (Tramadol Hcl) .... 1/2-1 Q 8 - 12 Hours As Needed 3)  Cozaar 100 Mg Tabs (Losartan Potassium) .... 1/2 Tablet By Mouth Once Daily 4)  Vitamin C 500 Mg Tabs (Ascorbic Acid) .Marland Kitchen.. 1 Tab By Mouth Once Daily 5)  Oxygen 2 Liters 6)  Torsemide 20 Mg Tabs (Torsemide) .... 7 Tablets Daily 7)  Anucort-Hc 25 Mg Supp (Hydrocortisone Acetate) .Marland Kitchen.. 1 Rectally At Bedtime As Needed 8)  Klor-Con M20 20 Meq Cr-Tabs (Potassium Chloride Crys Cr) .... 2 Tabs By Mouth Twice Daily 9)  Simvastatin 20 Mg Tabs (Simvastatin) .... Take One Tablet By Mouth Daily At Bedtime 10)  Zaroxolyn 2.5 Mg Tabs (Metolazone) .Marland Kitchen.. 1 Tab Every Other Day Prn 11)  Procrit 01027 Unit/ml Soln (Epoetin Alfa) .... Take 1 Injection Q 2 Weeks. 12)  Protonix 40 Mg Tbec (Pantoprazole Sodium) .... Two Times A Day With Meals 13)  Tylenol 325 Mg Tabs (Acetaminophen) .... Every 4 Hours  As Needed For Pain 14)  Xopenex Hfa 45 Mcg/act Aero (Levalbuterol Tartrate) .... 2 Puffs Three Times A Day 15)  Metoprolol Succinate 50 Mg Xr24h-Tab (Metoprolol Succinate) .... Take 1/2 Tablet By Mouth Daily 16)  Allopurinol 100 Mg Tabs (Allopurinol) .... Takes 200mg  By Mouth Once Daily 17)  Prednisone 5 Mg Tabs (Prednisone) .... Once Daily  Allergies (verified): No Known Drug Allergies  Past History:  Past medical, surgical, family and social histories (including risk factors) reviewed, and no changes noted (except as noted below).  Past Medical History: Reviewed history from 09/25/2010 and no changes required. Restrictive cardiomyopathy AF 1996,S/P cardioversion;  CVA 1999; COAD exacerbation 2007; sleep apnea/CPAP (25366) ANEMIA NOS (ICD-285.9), iron deficiency  GOUT (ICD-274.9) PEPTIC ULCER, ACUTE, HEMORRHAGE, HX OF (ICD-V12.71), 2005, 2011 HYPOTHYROIDISM (ICD-244.9) HYPERTENSION (ICD-401.9) Diastolic Dysfunction Hx adenomatous colon polyps  03/2004 GERD with hiatal hernia Diverticulosis Anxiety Disorder Arthritis Hemorrhoids-internal and external COPD MORBID OBESITY CAD, ONE VESSEL, BY CATH 08/06/08 EJECTION FRACTION OVERALL PRESERVED BY ECHO 04/30/08 SUPPLEMENTAL HOME OXYGEN Renal failure, Dr Caryn Section Hyperlipidemia  Past Surgical History: Reviewed history from 09/25/2010 and no changes required. Appendectomy Oophorectomy Incision and drainage of cellulitis/umbilical abcess- 5/09; Cath : pulmonary HTN, non obstructive CAD Hysterectomy  Family History: Reviewed history from 10/31/2009 and no changes required. heart disease: father MI; brother CABG, 5 vessel clotting disorders: brother  rheumatism: brother cancer: mother (breast) sister(breast) No FH of Colon Cancer:  Social History: Reviewed history from 09/25/2010 and no changes required. pt is retired. worked at [employer] married one child pt never smoked  Alcohol Use - no Patient does not get regular  exercise, Oxygen dependent.  Daily Caffeine Use: one daily   Review of Systems       Reviewed.  NEg to the above problem except as noted above.  Vital Signs:  Patient profile:   77 year old female Height:      59 inches Weight:      155.50 pounds BMI:     31.52 Pulse rate:   59 / minute Pulse rhythm:   regular Resp:     20 per minute BP sitting:   132 / 60  (left arm) Cuff size:   regular  Vitals Entered By: Vikki Ports (December 22, 2010 11:15 AM)  Physical Exam  Additional Exam:  patient is in NAD HEENT:  Normocephalic, atraumatic. EOMI, PERRLA.  Neck: JVP is approx 9 cm.  . No thyromegaly. No bruits.  Lungs: clear to auscultation. No rales no wheezes.  Heart: Regular rate and rhythm. Normal S1, S2. No S3.   No significant murmurs. PMI not displaced.  Abdomen:  Supple, nontender. Normal bowel sounds. No masses. No hepatomegaly.  Extremities:   Good distal pulses throughout. Trace  lower extremity edema.  Musculoskeletal :moving all extremities.  Neuro:   alert and oriented x3.    EKG  Procedure date:  12/22/2010  Findings:      Junctional  rhythm  Impression & Recommendations:  Problem # 1:  ATRIAL FIBRILLATION (ICD-427.31) I have asked her to stop metoprolol and follow HR. BP is adequate No anticoag  Problem # 2:  CHF (ICD-428.0) Volume does not look bad but I would like to check labs (renal function) today. The following medications were removed from the medication list:    Metoprolol Succinate 50 Mg Xr24h-tab (Metoprolol succinate) .Marland Kitchen... Take 1/4 th of a tablet by mouth twice a day Her updated medication list for this problem includes:    Cozaar 100 Mg Tabs (Losartan potassium) .Marland Kitchen... 1/2 tablet by mouth once daily    Torsemide 20 Mg Tabs (Torsemide) .Marland KitchenMarland KitchenMarland KitchenMarland Kitchen 6 tablets daily    Zaroxolyn 2.5 Mg Tabs (Metolazone) .Marland Kitchen... 1 tab every other day prn  Orders: EKG w/ Interpretation (93000) TLB-BMP (Basic Metabolic Panel-BMET) (80048-METABOL) TLB-BNP  (B-Natriuretic Peptide) (83880-BNPR) TLB-CBC Platelet - w/Differential (85025-CBCD)  Problem # 3:  ABDOMINAL PAIN -GENERALIZED (ICD-789.07) Resolved.  HIDA scan results show patent ducts.  Patient to f/u with Lamont Snowball  Problem # 4:  HYPERLIPIDEMIA-MIXED (ICD-272.4) Continue. Her updated medication list for this problem includes:    Simvastatin 20 Mg Tabs (Simvastatin) .Marland Kitchen... Take one tablet by mouth daily at bedtime  Patient Instructions: 1)  Your physician recommends that you schedule a follow-up appointment in: 3 months with Dr. Tenny Craw 2)  Your physician recommends that you have  lab work today: BMET, BNP, CBC 3)  Your physician has recommended you make the following change in your medication: Take 1/4 th of a Metoprolol two times a day

## 2011-01-11 NOTE — Letter (Signed)
Summary: Patient Notice-Endo Biopsy Results  [facility]  [address] [place], [place] [address]   Phone: [phone]  Fax: [phone]        January 02, 2011 MRN: [id number]    [name] [address] [place], [place]  [address]    Dear Ms. Mayford Knife,  I am pleased to inform you that the biopsies taken during your recent endoscopic examination did not show any evidence of cancer upon pathologic examination. The biopsies showed erosive changes.  Continue with the treatment plan as outlined on the day of your      exam.  Please call us if you are having persistent problems or have questions about your condition that have not been fully answered at this time.  Sincerely,  Meryl Dare MD [facility]  This letter has been electronically signed by your physician.  Appended Document: Patient Notice-Endo Biopsy Results LETTER MAILED

## 2011-01-11 NOTE — Procedures (Signed)
Summary: Capsule Endoscopy   Capsule Endoscopy  Procedure date:  11/23/2010  Findings:      Performing Location: [facility]   Ordering Physician: Claudette Head, MD  Report created/read by: Claudette Head, MD  Reason for Referral: 77 year old female with recent GI bleed, while on Coumadin.  Colonoscopy showed diverticulosis, and polyps.  EGD with a clean based 5 mm antral ulcer. Decision regarding continuing coumadin pending.  Procedure Information and Findings:  1) Complete study, fair prep, rapid small bowel transit 2) two tiny small bowel AVMs  Summary and Recommendations:  Evaluate patient to reconsider coumadin restart.  This report was created from the original report, which was reviewed and signed by the above listed reading physician.   Appended Document: Capsule Endoscopy Patient aware. Per Dr Russella Dar patient to have HIDA scan on 12/25/10 and EGD to confirm ulcer healing on 12/26/10 prior to reconsidering restarting coumadin.  Patient aware

## 2011-01-11 NOTE — Progress Notes (Signed)
Summary: Synthroid rx  Phone Note Call from Patient Call back at Home Phone [phone]   Caller: Daughter Summary of Call: Patient daughter left message on triage asking if the patient could take generic Synthroid in order for the patient to insurance company to cover it.  I do not see where this prescription has been marked BMN, ok to dispense generic? Initial call taken by: Lucious Groves CMA,  December 12, 2010 11:50 AM  Follow-up for Phone Call        changed  Follow-up by: Marga Melnick MD,  December 12, 2010 11:56 AM  Additional Follow-up for Phone Call Additional follow up Details #1::        Patient notified. Additional Follow-up by: Lucious Groves CMA,  December 12, 2010 3:39 PM    New/Updated Medications: * LEVOTHYROXINE SODIUM 75 MCG 1 by mouth qd LEVOTHYROXINE SODIUM 75 MCG TABS (LEVOTHYROXINE SODIUM) 1 by mouth once daily Prescriptions: LEVOTHYROXINE SODIUM 75 MCG TABS (LEVOTHYROXINE SODIUM) 1 by mouth once daily  #90 x 1   Entered by:   Lucious Groves CMA   Authorized by:   Marga Melnick MD   Signed by:   Lucious Groves CMA on 12/12/2010   Method used:   Electronically to        [id number] (retail)       [address] [place], [place]  [address]       Ph: [phone]       Fax: [phone]   RxID:   [id number] LEVOTHYROXINE SODIUM 75 MCG 1 by mouth qd  #90 x 1   Entered and Authorized by:   Marga Melnick MD   Signed by:   Marga Melnick MD on 12/12/2010   Method used:   Historical   RxID:   [id number]

## 2011-01-11 NOTE — Progress Notes (Signed)
   Phone Note Outgoing Call   Call placed by: Dietrich Pates, MD Action Taken: Phone Call Completed Summary of Call: Reviewed ECG from visit last week.  I would stop metoprolol.  Resume if heart rate stays in 90s. Initial call taken by: Sherrill Raring, MD, [facility]  December 25, 2010 6:04 PM

## 2011-01-11 NOTE — Consult Note (Signed)
Summary: [facility]   Imported By: Lanelle Bal 12/07/2010 12:54:01  _____________________________________________________________________  External Attachment:    Type:   Image     Comment:   External Document

## 2011-01-11 NOTE — Progress Notes (Signed)
Summary: Still having Rectal Bleeding   Phone Note Call from Patient   Caller: [phone] Gavin Pound - daughter Call For: Dr Russella Dar Reason for Call: Lab or Test Results Summary of Call: Had Endo Capsule and doesn't think she passed the capsule yet. Still having rectal bleeding. Initial call taken by: Leanor Kail [facility],  November 21, 2010 8:26 AM  Follow-up for Phone Call        Spoke with daughter, Gavin Pound who stated patient started bleeding with her BM's last week after her visit with Dr Alwyn Ren. She reports bright red blood, some abdominal pain and nausea also. Gavin Pound stated the amount of blood has increased daily. Patient  has not resumed Coumadin per Dr Ardell Isaacs orders. Also, patient does not believe she has passed the capsule. Per Gavin Pound, patient has a visit with Dr Caryn Section today so we called and his office will do a CBC and fax [place] the results per Dr Russella Dar. Patient  will see Mike Gip PA at 0930am tomorrow. Graciella Freer, RN 11/21/10 @ 1125  Follow-up by: Darcey Nora RN, CGRN,  November 21, 2010 11:28 AM     Appended Document: Still having Rectal Bleeding Called Dr. Scherrie Gerlach office, [facility], [phone].  They faxed me labs that were collected on 11-21-2010.  Amy Esterwood PA is reviewing them.

## 2011-01-17 NOTE — Letter (Signed)
Summary: [facility]   Imported By: Lanelle Bal 01/08/2011 10:00:58  _____________________________________________________________________  External Attachment:    Type:   Image     Comment:   External Document

## 2011-01-17 NOTE — Progress Notes (Signed)
Summary: dr. Anselm Jungling advise pt to start back on coumadin  Phone Note Call from Patient Call back at Home Phone [phone]   Caller: Patient Reason for Call: Talk to Nurse Summary of Call: per pt calling dr.starks advise pt to start back on coumadin .  Initial call taken by: Lorne Skeens,  January 05, 2011 10:46 AM  Follow-up for Phone Call        Called patient back and advised that Dr. Tenny Craw does not want her to go back on Coumadin at this time and she will discuss this issue with Dr.Stark further. Will follow up with Mrs. Willliams again.  Layne Benton, RN, BSN  January 08, 2011 6:40 PM   Additional Follow-up for Phone Call Additional follow up Details #1::        Note sent to Lamont Snowball Additional Follow-up by: Sherrill Raring, MD, [facility],  January 08, 2011 10:22 PM

## 2011-01-22 ENCOUNTER — Telehealth: Payer: Self-pay | Admitting: Internal Medicine

## 2011-01-22 ENCOUNTER — Other Ambulatory Visit: Payer: Self-pay

## 2011-01-22 ENCOUNTER — Other Ambulatory Visit: Payer: Self-pay | Admitting: Nephrology

## 2011-01-22 ENCOUNTER — Encounter (HOSPITAL_COMMUNITY)
Admission: RE | Admit: 2011-01-22 | Discharge: 2011-01-22 | Disposition: A | Discharge status: Home / Self Care | Payer: Medicare Other | Source: Ambulatory Visit | Attending: Nephrology | Admitting: Nephrology

## 2011-01-22 DIAGNOSIS — D638 Anemia in other chronic diseases classified elsewhere: Secondary | ICD-10-CM | POA: Insufficient documentation

## 2011-01-22 DIAGNOSIS — N183 Chronic kidney disease, stage 3 unspecified: Secondary | ICD-10-CM | POA: Insufficient documentation

## 2011-01-22 LAB — IRON AND TIBC
Iron: 90 ug/dL (ref 42–135)
Saturation Ratios: 30 % (ref 20–55)
TIBC: 297 ug/dL (ref 250–470)

## 2011-01-23 LAB — POCT HEMOGLOBIN-HEMACUE: Hemoglobin: 11.9 g/dL — ABNORMAL LOW (ref 12.0–15.0)

## 2011-01-29 ENCOUNTER — Encounter: Payer: Self-pay | Admitting: Internal Medicine

## 2011-02-05 ENCOUNTER — Other Ambulatory Visit: Payer: Self-pay

## 2011-02-05 ENCOUNTER — Encounter (HOSPITAL_COMMUNITY): Payer: Medicare Other

## 2011-02-06 LAB — POCT HEMOGLOBIN-HEMACUE: Hemoglobin: 10 g/dL — ABNORMAL LOW (ref 12.0–15.0)

## 2011-02-06 NOTE — Progress Notes (Signed)
Summary: request call  Phone Note Call from Patient Call back at Home Phone [phone]   Caller: Patient Summary of Call: Pt request call Initial call taken by: Judie Grieve,  January 22, 2011 2:30 PM  Follow-up for Phone Call        Patient calling back again wanting to know if she should return to taking Coumadin. Advised her that Dr.Ross did not want her to take Coumadin because of the high risk of bleed. She wanted to know about taking a baby ASA instead. Advised the patient that I would ask Dr.Ross and call her back.  Layne Benton, RN, BSN  January 22, 2011 6:59 PM   Additional Follow-up for Phone Call Additional follow up Details #1::        I have reviewed.  With history of GI bleeds and severe anemia I think she is not a good candidate for  coumadin.  I would use EC ASA 81 mg.   Additional Follow-up by: Sherrill Raring, MD, [facility],  January 29, 2011 9:59 AM     Appended Document: request call Patient aware. She will start ECASA 81mg  every day.

## 2011-02-06 NOTE — Miscellaneous (Signed)
  Clinical Lists Changes  Medications: Added new medication of ECOTRIN LOW STRENGTH 81 MG TBEC (ASPIRIN) 1 tab every day

## 2011-02-09 ENCOUNTER — Telehealth: Payer: Self-pay | Admitting: Internal Medicine

## 2011-02-12 ENCOUNTER — Encounter: Payer: Self-pay | Admitting: Internal Medicine

## 2011-02-12 ENCOUNTER — Telehealth: Payer: Self-pay | Admitting: Gastroenterology

## 2011-02-12 ENCOUNTER — Ambulatory Visit (INDEPENDENT_AMBULATORY_CARE_PROVIDER_SITE_OTHER): Payer: Medicare Other | Admitting: Internal Medicine

## 2011-02-12 ENCOUNTER — Other Ambulatory Visit: Payer: Self-pay | Admitting: Internal Medicine

## 2011-02-12 DIAGNOSIS — I4891 Unspecified atrial fibrillation: Secondary | ICD-10-CM

## 2011-02-12 DIAGNOSIS — K625 Hemorrhage of anus and rectum: Secondary | ICD-10-CM

## 2011-02-12 DIAGNOSIS — I509 Heart failure, unspecified: Secondary | ICD-10-CM

## 2011-02-12 DIAGNOSIS — R0989 Other specified symptoms and signs involving the circulatory and respiratory systems: Secondary | ICD-10-CM

## 2011-02-12 LAB — CBC WITH DIFFERENTIAL/PLATELET
Basophils Absolute: 0 10*3/uL (ref 0.0–0.1)
HCT: 27.7 % — ABNORMAL LOW (ref 36.0–46.0)
Hemoglobin: 9.2 g/dL — ABNORMAL LOW (ref 12.0–15.0)
Lymphs Abs: 1 10*3/uL (ref 0.7–4.0)
MCHC: 33.3 g/dL (ref 30.0–36.0)
MCV: 102.8 fl — ABNORMAL HIGH (ref 78.0–100.0)
Monocytes Relative: 8.8 % (ref 3.0–12.0)
Neutro Abs: 5.2 10*3/uL (ref 1.4–7.7)
RDW: 18.1 % — ABNORMAL HIGH (ref 11.5–14.6)

## 2011-02-12 LAB — BRAIN NATRIURETIC PEPTIDE: Pro B Natriuretic peptide (BNP): 314.2 pg/mL — ABNORMAL HIGH (ref 0.0–100.0)

## 2011-02-12 LAB — BASIC METABOLIC PANEL
BUN: 128 mg/dL (ref 6–23)
Calcium: 9.2 mg/dL (ref 8.4–10.5)
Chloride: 92 mEq/L — ABNORMAL LOW (ref 96–112)
Creatinine, Ser: 2.2 mg/dL — ABNORMAL HIGH (ref 0.4–1.2)

## 2011-02-13 ENCOUNTER — Ambulatory Visit (INDEPENDENT_AMBULATORY_CARE_PROVIDER_SITE_OTHER): Payer: Medicare Other | Admitting: Nurse Practitioner

## 2011-02-13 ENCOUNTER — Encounter: Payer: Self-pay | Admitting: Nurse Practitioner

## 2011-02-13 DIAGNOSIS — K5900 Constipation, unspecified: Secondary | ICD-10-CM | POA: Insufficient documentation

## 2011-02-13 DIAGNOSIS — K648 Other hemorrhoids: Secondary | ICD-10-CM | POA: Insufficient documentation

## 2011-02-13 DIAGNOSIS — D649 Anemia, unspecified: Secondary | ICD-10-CM

## 2011-02-13 DIAGNOSIS — K625 Hemorrhage of anus and rectum: Secondary | ICD-10-CM

## 2011-02-15 NOTE — Progress Notes (Signed)
Summary: [name] CALLING RE FLUID IN NECK  Phone Note Call from Patient   Caller: Daughter Adria Devon [phone] Reason for Call: Talk to Nurse Summary of Call: pt calling re pt starting aspirin last week, yesterday pt complained of sore neck, feels heavy, has fluid buildup-what to do? Initial call taken by: Glynda Jaeger,  February 09, 2011 10:39 AM  Follow-up for Phone Call        Called daughter [name] and [facility] for call back.  Layne Benton, RN, BSN  February 09, 2011 2:49 PM   per pt dtr calling back [phone] Lorne Skeens  February 09, 2011 2:59 PM  Additional Follow-up for Phone Call Additional follow up Details #1::        Tried to call patient several times this pm but the line was busy. Reached her daughter [name] instead and she advised me that the patient had the phone off the hook all day because she does not hear well. [name] states that her Mom is complaining about fullness and heaviness in her neck and head since yesterday. Dr.Caileen Veracruz spoke with her also and advised that she go to the ER if she continues to feel bad and condition becomes worse. If not, she will come to the office on 3/5 to be seen by Dr.Forest Redwine at 830 am.

## 2011-02-19 ENCOUNTER — Encounter (HOSPITAL_COMMUNITY): Payer: Medicare Other

## 2011-02-19 LAB — RENAL FUNCTION PANEL
CO2: 34 mEq/L — ABNORMAL HIGH (ref 19–32)
Chloride: 99 mEq/L (ref 96–112)
Creatinine, Ser: 1.63 mg/dL — ABNORMAL HIGH (ref 0.4–1.2)
GFR calc Af Amer: 37 mL/min — ABNORMAL LOW (ref >60)
GFR calc non Af Amer: 30 mL/min — ABNORMAL LOW (ref >60)

## 2011-02-19 LAB — IRON AND TIBC
Iron: 98 ug/dL (ref 42–145)
Saturation Ratios: 33 % (ref 20–55)
TIBC: 298 ug/dL (ref 250–470)
TIBC: 293 ug/dL (ref 250–470)
UIBC: 195 ug/dL

## 2011-02-19 LAB — POCT HEMOGLOBIN-HEMACUE
Hemoglobin: 11.2 g/dL — ABNORMAL LOW (ref 12.0–15.0)
Hemoglobin: 9.6 g/dL — ABNORMAL LOW (ref 12.0–15.0)

## 2011-02-20 ENCOUNTER — Other Ambulatory Visit: Payer: Medicare Other

## 2011-02-20 ENCOUNTER — Telehealth: Payer: Self-pay | Admitting: Nurse Practitioner

## 2011-02-20 ENCOUNTER — Other Ambulatory Visit: Payer: Self-pay

## 2011-02-20 ENCOUNTER — Encounter (HOSPITAL_COMMUNITY): Payer: Medicare Other | Attending: Nephrology

## 2011-02-20 ENCOUNTER — Encounter (INDEPENDENT_AMBULATORY_CARE_PROVIDER_SITE_OTHER): Payer: Self-pay | Admitting: *Deleted

## 2011-02-20 DIAGNOSIS — D638 Anemia in other chronic diseases classified elsewhere: Secondary | ICD-10-CM | POA: Insufficient documentation

## 2011-02-20 DIAGNOSIS — N183 Chronic kidney disease, stage 3 unspecified: Secondary | ICD-10-CM | POA: Insufficient documentation

## 2011-02-20 LAB — URINE MICROSCOPIC-ADD ON

## 2011-02-20 LAB — CBC
HCT: 25.6 % — ABNORMAL LOW (ref 36.0–46.0)
HCT: 25.6 % — ABNORMAL LOW (ref 36.0–46.0)
Hemoglobin: 8.7 g/dL — ABNORMAL LOW (ref 12.0–15.0)
Hemoglobin: 9.6 g/dL — ABNORMAL LOW (ref 12.0–15.0)
Hemoglobin: 7.9 g/dL — ABNORMAL LOW (ref 12.0–15.0)
MCH: 33.1 pg (ref 26.0–34.0)
MCH: 33.2 pg (ref 26.0–34.0)
MCHC: 34 g/dL (ref 30.0–36.0)
MCHC: 30.9 g/dL (ref 30.0–36.0)
MCV: 97.6 fL (ref 78.0–100.0)
Platelets: 223 10*3/uL (ref 150–400)
RBC: 2.9 MIL/uL — ABNORMAL LOW (ref 3.87–5.11)
RDW: 19.9 % — ABNORMAL HIGH (ref 11.5–15.5)

## 2011-02-20 LAB — IRON AND TIBC
Saturation Ratios: 31 % (ref 20–55)
TIBC: 281 ug/dL (ref 250–470)

## 2011-02-20 LAB — URINALYSIS, ROUTINE W REFLEX MICROSCOPIC
Bilirubin Urine: NEGATIVE
Hgb urine dipstick: NEGATIVE
Nitrite: POSITIVE — AB
Specific Gravity, Urine: 1.015 (ref 1.005–1.030)
pH: 5.5 (ref 5.0–8.0)

## 2011-02-20 LAB — PROTIME-INR
INR: 1.96 — ABNORMAL HIGH (ref 0.00–1.49)
INR: 3.86 — ABNORMAL HIGH (ref 0.00–1.49)
Prothrombin Time: 22.5 seconds — ABNORMAL HIGH (ref 11.6–15.2)

## 2011-02-20 LAB — CROSSMATCH: Unit division: 0

## 2011-02-20 LAB — PREPARE FRESH FROZEN PLASMA: Unit division: 0

## 2011-02-20 LAB — CARDIAC PANEL(CRET KIN+CKTOT+MB+TROPI)
CK, MB: 2 ng/mL (ref 0.3–4.0)
Relative Index: 1.5 (ref 0.0–2.5)
Troponin I: 0.03 ng/mL (ref 0.00–0.06)

## 2011-02-20 LAB — HEMOGLOBIN AND HEMATOCRIT, BLOOD
HCT: 26.5 % — ABNORMAL LOW (ref 36.0–46.0)
HCT: 26.6 % — ABNORMAL LOW (ref 36.0–46.0)
Hemoglobin: 8.9 g/dL — ABNORMAL LOW (ref 12.0–15.0)
Hemoglobin: 8.9 g/dL — ABNORMAL LOW (ref 12.0–15.0)
Hemoglobin: 9.1 g/dL — ABNORMAL LOW (ref 12.0–15.0)

## 2011-02-20 LAB — BASIC METABOLIC PANEL
BUN: 75 mg/dL — ABNORMAL HIGH (ref 6–23)
CO2: 30 mEq/L (ref 19–32)
CO2: 31 mEq/L (ref 19–32)
Calcium: 9.8 mg/dL (ref 8.4–10.5)
Creatinine, Ser: 1.45 mg/dL — ABNORMAL HIGH (ref 0.4–1.2)
GFR calc Af Amer: 42 mL/min — ABNORMAL LOW (ref >60)
GFR calc non Af Amer: 28 mL/min — ABNORMAL LOW (ref >60)
GFR calc non Af Amer: 35 mL/min — ABNORMAL LOW (ref >60)
Glucose, Bld: 96 mg/dL (ref 70–99)
Potassium: 3 mEq/L — ABNORMAL LOW (ref 3.5–5.1)
Sodium: 146 mEq/L — ABNORMAL HIGH (ref 135–145)

## 2011-02-20 LAB — DIFFERENTIAL
Basophils Absolute: 0 10*3/uL (ref 0.0–0.1)
Basophils Relative: 0 % (ref 0–1)
Eosinophils Absolute: 0.2 10*3/uL (ref 0.0–0.7)
Eosinophils Relative: 3 % (ref 0–5)
Monocytes Absolute: 0.5 10*3/uL (ref 0.1–1.0)
Monocytes Relative: 11 % (ref 3–12)
Neutro Abs: 3.6 10*3/uL (ref 1.7–7.7)

## 2011-02-20 LAB — COMPREHENSIVE METABOLIC PANEL
BUN: 99 mg/dL — ABNORMAL HIGH (ref 6–23)
Calcium: 9.3 mg/dL (ref 8.4–10.5)
Creatinine, Ser: 2.2 mg/dL — ABNORMAL HIGH (ref 0.4–1.2)
Glucose, Bld: 120 mg/dL — ABNORMAL HIGH (ref 70–99)
Sodium: 142 mEq/L (ref 135–145)
Total Protein: 6 g/dL (ref 6.0–8.3)

## 2011-02-20 LAB — MAGNESIUM: Magnesium: 2.1 mg/dL (ref 1.5–2.5)

## 2011-02-20 LAB — POCT HEMOGLOBIN-HEMACUE
Hemoglobin: 7.8 g/dL — ABNORMAL LOW (ref 12.0–15.0)
Hemoglobin: 9.6 g/dL — ABNORMAL LOW (ref 12.0–15.0)

## 2011-02-20 LAB — BRAIN NATRIURETIC PEPTIDE: Pro B Natriuretic peptide (BNP): 433 pg/mL — ABNORMAL HIGH (ref 0.0–100.0)

## 2011-02-20 LAB — HEMOCCULT GUIAC POC 1CARD (OFFICE): Fecal Occult Bld: POSITIVE

## 2011-02-20 LAB — TSH: TSH: 2.441 u[IU]/mL (ref 0.350–4.500)

## 2011-02-20 NOTE — Assessment & Plan Note (Addendum)
Summary: Rectal bleeding (Dr. Russella Dar pt)   History of Present Illness Visit Type: Follow-up Visit Primary GI MD: Elie Goody MD [facility] Primary Provider: Marga Melnick, MD  Requesting Provider: na Chief Complaint: Rectal bleeding, BRB in toilet water with bowel movements, some craping in lower abdomen x 1 week History of Present Illness:   Patient is a 77 year old female followed by Dr. Russella Dar for history of PUD , small bowel AVMs and hemorrhoids. Patient was hospitalized in Sept. 2011 for UGIB. She was found to have multiple antral ulcers and one small duodenal AVM.  Since then she has undergone a repeat EGD, colonoscopy and small bowel video capsule for evaluation of anemia and heme positive stools. Her last EGD was in January and was done to document ulcer healing. Patient gets Procrit under direction of Dr. Caryn Section (nephrologist). On 12/22/10  her hemoglobin was 10.9, BUN 77, creatinine 2.0. Yesterday at her visit with Dr. Tenny Craw (cardiology) patient's hemoglobin was down to 9.2. Her BUN has risen to 128. Her potassium was low at 2.6 and this was repleted by Dr. Tenny Craw. The patient and her family agree that she was doing well overall until a few days ago when she developed recurrent lower abdominal discomfort and rectal bleeding. The patient is constipated, she has been using stool softeners and had planned on trying Ex-Lax.  Her stools are firm and small in quantity. She has seen red blood in the toilet water and when she wipes. Her lower abdominal discomfort is constant, not really related to bowel movements. No black stools. Patient had been using steroid suppositories, ran out of refills and is requesting more.   GI Review of Systems    Reports abdominal pain.     Location of  Abdominal pain: lower abdomen.    Denies acid reflux, belching, bloating, chest pain, dysphagia with liquids, dysphagia with solids, heartburn, loss of appetite, nausea, vomiting, vomiting blood, weight loss, and  weight gain.        Reports black tarry stools, constipation, rectal bleeding, and  rectal pain.     Denies anal fissure, change in bowel habit, diarrhea, diverticulosis, fecal incontinence, heme positive stool, hemorrhoids, irritable bowel syndrome, jaundice, light color stool, and  liver problems.    Current Medications (verified): 1)  Levothyroxine Sodium 75 Mcg Tabs (Levothyroxine Sodium) .Marland Kitchen.. 1 By Mouth Once Daily 2)  Ultram 50 Mg  Tabs (Tramadol Hcl) .... 1/2-1 Q 8 - 12 Hours As Needed 3)  Cozaar 100 Mg Tabs (Losartan Potassium) .... 1/2 Tablet By Mouth Once Daily 4)  Vitamin C 500 Mg Tabs (Ascorbic Acid) .Marland Kitchen.. 1 Tab By Mouth Once Daily 5)  Oxygen 2 Liters 6)  Torsemide 20 Mg Tabs (Torsemide) .... 6 Tablets Daily 7)  Klor-Con M20 20 Meq Cr-Tabs (Potassium Chloride Crys Cr) .... 2 Tabs Morning and 1 Tab Qhs 8)  Simvastatin 20 Mg Tabs (Simvastatin) .... Take One Tablet By Mouth Daily At Bedtime 9)  Zaroxolyn 2.5 Mg Tabs (Metolazone) .Marland Kitchen.. 1 Tab Every Other Day Prn 10)  Procrit 29562 Unit/ml Soln (Epoetin Alfa) .... Take 1 Injection Q 2 Weeks. 11)  Protonix 40 Mg Tbec (Pantoprazole Sodium) .... Two Times A Day With Meals 12)  Tylenol 325 Mg Tabs (Acetaminophen) .... Every 4 Hours As Needed For Pain 13)  Xopenex Hfa 45 Mcg/act Aero (Levalbuterol Tartrate) .... 2 Puffs Three Times A Day 14)  Allopurinol 100 Mg Tabs (Allopurinol) .... Takes 200mg  By Mouth Once Daily 15)  Prednisone 5 Mg Tabs (Prednisone) .Marland KitchenMarland KitchenMarland Kitchen  Once Daily 16)  Ecotrin Low Strength 81 Mg Tbec (Aspirin) .Marland Kitchen.. 1 Tab Every Day  Allergies (verified): No Known Drug Allergies  Past History:  Past Medical History: Reviewed history from 09/25/2010 and no changes required. Restrictive cardiomyopathy AF 1996,S/P cardioversion;  CVA 1999; COAD exacerbation 2007; sleep apnea/CPAP ([address]109) ANEMIA NOS (ICD-285.9), iron deficiency  GOUT (ICD-274.9) PEPTIC ULCER, ACUTE, HEMORRHAGE, HX OF (ICD-V12.71), 2005, 2011 HYPOTHYROIDISM  (ICD-244.9) HYPERTENSION (ICD-401.9) Diastolic Dysfunction Hx adenomatous colon polyps  03/2004 GERD with hiatal hernia Diverticulosis Anxiety Disorder Arthritis Hemorrhoids-internal and external COPD MORBID OBESITY CAD, ONE VESSEL, BY CATH 08/06/08 EJECTION FRACTION OVERALL PRESERVED BY ECHO 04/30/08 SUPPLEMENTAL HOME OXYGEN Renal failure, Dr Caryn Section Hyperlipidemia  Past Surgical History: Reviewed history from 09/25/2010 and no changes required. Appendectomy Oophorectomy Incision and drainage of cellulitis/umbilical abcess- 5/09; Cath : pulmonary HTN, non obstructive CAD Hysterectomy  Family History: Reviewed history from 10/31/2009 and no changes required. heart disease: father MI; brother CABG, 5 vessel clotting disorders: brother rheumatism: brother cancer: mother (breast) sister(breast) No FH of Colon Cancer:  Social History: Reviewed history from 09/25/2010 and no changes required. pt is retired. worked at [employer] married one child pt never smoked  Alcohol Use - no Patient does not get regular exercise, Oxygen dependent.  Daily Caffeine Use: one daily   Review of Systems       The patient complains of shortness of breath.  The patient denies allergy/sinus, anemia, anxiety-new, arthritis/joint pain, back pain, blood in urine, breast changes/lumps, change in vision, confusion, cough, coughing up blood, depression-new, fainting, fatigue, fever, headaches-new, hearing problems, heart murmur, heart rhythm changes, itching, menstrual pain, muscle pains/cramps, night sweats, nosebleeds, pregnancy symptoms, skin rash, sleeping problems, sore throat, swelling of feet/legs, swollen lymph glands, thirst - excessive , urination - excessive , urination changes/pain, urine leakage, vision changes, and voice change.    Vital Signs:  Patient profile:   77 year old female Height:      59 inches Weight:      152 pounds BMI:     30.81 Pulse rate:   76 / minute Pulse  rhythm:   regular BP sitting:   100 / 54  (right arm) Cuff size:   regular  Vitals Entered By: June McMurray CMA Duncan Dull) (February 13, 2011 8:33 AM)  Physical Exam  General:  Obese white female in no acute distress. Eyes:  Conjunctiva pink, no icterus.  Neck:  no obvious masses  Lungs:  Clear throughout to auscultation. Heart:  RRR Abdomen:  Obese, soft, nontender, normoactive bowel sounds. No masses felt.  Rectal:  Several swollen, external hemorrhoid, a few swollen internal hemorrhoids as well. Firm, medium brown stool in vault, heme positive.  Msk:  Symmetrical with no gross deformities. Normal posture. Extremities:  No palmar erythema, no edema.  Neurologic:  Alert and  oriented x4;  grossly normal neurologically. Skin:  Intact without significant lesions or rashes. Cervical Nodes:  No significant cervical adenopathy. Psych:  Alert and cooperative. Normal mood and affect.   Impression & Recommendations:  Problem # 1:  ANEMIA-UNSPECIFIED (ICD-285.9) Assessment Deteriorated Her hemoglobin has recently declined (down from 10.9 to 9.2), possibly from the rectal bleeding alone but could also be from occult gastrointestinal bleeding. Stool is brown but heme positive today.  Patient was recently worked up by [place] for occult bleeding. She does have known history of intestinal AVMs (duodenal AVM on EGD and two tiny small bowel AVMs on capsule endoscopy). Will treat hemorrhoids, hopefully bleeding will resolve, hemoglobin  stablize and this will prove not to be an occult GI bleed. Will recheck CBC next Tuesday and daugher will call for results.   She may ultimately require a blood transfusion.   Problem # 2:  HEMORRHOIDS-INTERNAL (ICD-455.0) Assessment: Deteriorated Suspect bleeding from hemorrhoids. Will refill prescription for steroid suppositories.   Problem # 3:  COLONIC POLYPS, ADENOMATOUS, HX OF (ICD-V12.72) Assessment: Comment Only Adenomatous polyps Nov.2011  Problem # 4:  RECTAL  BLEEDING (ICD-569.3) See #2.  Problem # 5:  ATRIAL FIBRILLATION (ICD-427.31) Assessment: Comment Only Patient was on Coumadin prior to gastrointestinal bleeding. Of course she isn't able to resume it at present. Patient saw Dr. Tenny Craw yesterday and she is aware of the rectal bleeding and declining hemoglobin. In fact, Dr. Tenny Craw called to get the patient an appt.with [place] today.  Patient Instructions: 1)  We sent a prescription for the Anucort suppositories to your pharmacy. 2)  Use Miralax 2-3 times daily until your bowels move on a regular basis then you can go to once daily. 3)  Please go to our lab, basement level on Tues afternoon the 13th. 4)  Copy sent to : Marga Melnick, MD 5)  The medication list was reviewed and reconciled.  All changed / newly prescribed medications were explained.  A complete medication list was provided to the patient / caregiver. Prescriptions: ANUCORT-HC 25 MG SUPP (HYDROCORTISONE ACETATE) Use 1 suppository rectally at bedtime x 10 days  #10 x 2   Entered by:   Lowry Ram NCMA   Authorized by:   Willette Cluster NP   Signed by:   Lowry Ram NCMA on 02/13/2011   Method used:   Electronically to        [facility] [address] [id number] (retail)       [address] [place], [place]  [address]       Ph: [phone]       Fax: [phone]   RxID:   [id number]

## 2011-02-20 NOTE — Assessment & Plan Note (Signed)
Summary: add on heaviness in neck/head /jr   Referring Provider:  na Primary Provider:  Marga Melnick, MD   CC:  pt complains of headaches neck problems also fatigue.  History of Present Illness: patient is a 77 year old with a history of a restrictive cardiomyopathy, atrial fibrillaton, CHF, renal insuff, gout, GI bleeding and diabetes. I saw hier in clinic in January. The patient's daughter called Friday saying theat she was not feeling good  She was full in her neck and head.  Very fatigued.  Problems Prior to Update: 1)  Hemorrhoids-internal  (ICD-455.0) 2)  Constipation  (ICD-564.00) 3)  Nausea and Vomiting  (ICD-787.01) 4)  Abdominal Pain -generalized  (ICD-789.07) 5)  Encounter For Long-term Use of Other Medications  (ICD-V58.69) 6)  Gastric Ulcer  (ICD-531.90) 7)  Gastr Ulcr Uns Acut/chrn w/o Hemor Perf/obst  (ICD-531.90) 8)  Blood in Stool  (ICD-578.1) 9)  Edema- Localized  (ICD-782.3) 10)  G I Bleed  (ICD-578.9) 11)  Renal Failure  (ICD-586) 12)  Hyperlipidemia-mixed  (ICD-272.4) 13)  Carotid Bruits, Bilateral  (ICD-785.9) 14)  External Hemorrhoids, With Complication  (ICD-455.5) 15)  Hemorrhoids, Internal, With Complication  (ICD-455.2) 16)  Change in Bowels  (ICD-787.99) 17)  Anemia-iron Deficiency  (ICD-280.9) 18)  Colonic Polyps, Adenomatous, Hx of  (ICD-V12.72) 19)  Anemia-unspecified  (ICD-285.9) 20)  Blood in Stool-melena  (ICD-578.1) 21)  Rectal Bleeding  (ICD-569.3) 22)  Atrial Fibrillation  (ICD-427.31) 23)  Cellulitis/abscess, Site Nec  (ICD-682.8) 24)  Cellulitis and Abscess of Trunk  (ICD-682.2) 25)  Gerd  (ICD-530.81) 26)  CHF  (ICD-428.0) 27)  Dyspnea  (ICD-786.05) 28)  Obstructive Sleep Apnea  (ICD-327.23) 29)  Anemia Nos  (ICD-285.9) 30)  Gout  (ICD-274.9) 31)  Cva  (ICD-434.91) 32)  Peptic Ulcer, Acute, Hemorrhage, Hx of  (ICD-V12.71) 33)  Hypothyroidism  (ICD-244.9) 34)  Hypertension  (ICD-401.9)  Current Medications (verified): 1)   Levothyroxine Sodium 75 Mcg Tabs (Levothyroxine Sodium) .Marland Kitchen.. 1 By Mouth Once Daily 2)  Ultram 50 Mg  Tabs (Tramadol Hcl) .... 1/2-1 Q 8 - 12 Hours As Needed 3)  Cozaar 100 Mg Tabs (Losartan Potassium) .... 1/2 Tablet By Mouth Once Daily 4)  Vitamin C 500 Mg Tabs (Ascorbic Acid) .Marland Kitchen.. 1 Tab By Mouth Once Daily 5)  Oxygen 2 Liters 6)  Torsemide 20 Mg Tabs (Torsemide) .... 6 Tablets Daily 7)  Klor-Con M20 20 Meq Cr-Tabs (Potassium Chloride Crys Cr) .... 2 Tabs Morning and 1 Tab Qhs 8)  Simvastatin 20 Mg Tabs (Simvastatin) .... Take One Tablet By Mouth Daily At Bedtime 9)  Zaroxolyn 2.5 Mg Tabs (Metolazone) .Marland Kitchen.. 1 Tab Every Other Day Prn 10)  Procrit 04540 Unit/ml Soln (Epoetin Alfa) .... Take 1 Injection Q 2 Weeks. 11)  Protonix 40 Mg Tbec (Pantoprazole Sodium) .... Two Times A Day With Meals 12)  Tylenol 325 Mg Tabs (Acetaminophen) .... Every 4 Hours As Needed For Pain 13)  Xopenex Hfa 45 Mcg/act Aero (Levalbuterol Tartrate) .... 2 Puffs Three Times A Day 14)  Allopurinol 100 Mg Tabs (Allopurinol) .... Takes 200mg  By Mouth Once Daily 15)  Prednisone 5 Mg Tabs (Prednisone) .... Once Daily 16)  Ecotrin Low Strength 81 Mg Tbec (Aspirin) .Marland Kitchen.. 1 Tab Every Day  Allergies: No Known Drug Allergies  Past History:  Past medical, surgical, family and social histories (including risk factors) reviewed, and no changes noted (except as noted below).  Past Medical History: Reviewed history from 09/25/2010 and no changes required. Restrictive cardiomyopathy AF 1996,S/P cardioversion;  CVA 1999; COAD exacerbation 2007; sleep apnea/CPAP (16109) ANEMIA NOS (ICD-285.9), iron deficiency  GOUT (ICD-274.9) PEPTIC ULCER, ACUTE, HEMORRHAGE, HX OF (ICD-V12.71), 2005, 2011 HYPOTHYROIDISM (ICD-244.9) HYPERTENSION (ICD-401.9) Diastolic Dysfunction Hx adenomatous colon polyps  03/2004 GERD with hiatal hernia Diverticulosis Anxiety Disorder Arthritis Hemorrhoids-internal and external COPD MORBID OBESITY CAD,  ONE VESSEL, BY CATH 08/06/08 EJECTION FRACTION OVERALL PRESERVED BY ECHO 04/30/08 SUPPLEMENTAL HOME OXYGEN Renal failure, Dr Caryn Section Hyperlipidemia  Past Surgical History: Reviewed history from 09/25/2010 and no changes required. Appendectomy Oophorectomy Incision and drainage of cellulitis/umbilical abcess- 5/09; Cath : pulmonary HTN, non obstructive CAD Hysterectomy  Family History: Reviewed history from 10/31/2009 and no changes required. heart disease: father MI; brother CABG, 5 vessel clotting disorders: brother rheumatism: brother cancer: mother (breast) sister(breast) No FH of Colon Cancer:  Social History: Reviewed history from 09/25/2010 and no changes required. pt is retired. worked at [employer] married one child pt never smoked  Alcohol Use - no Patient does not get regular exercise, Oxygen dependent.  Daily Caffeine Use: one daily   Review of Systems       Reviewed.  Neg to the above problem except as noted above.  Vital Signs:  Patient profile:   77 year old female Height:      59 inches Weight:      155 pounds BMI:     31.42 Pulse rate:   65 / minute Resp:     20 per minute BP sitting:   95 / 40  (left arm)  Vitals Entered By: Kem Parkinson (February 12, 2011 8:21 AM)  Physical Exam  Additional Exam:  Patient is in NAD in wheelchair HEENT:  Normocephalic, atraumatic. EOMI, PERRLA.  Neck: JVP is approx 9 cm Lungs: clear to auscultation. No rales no wheezes.  Heart: Irregular rate and rhythm. Normal S1, S2. No S3.   No significant murmurs. PMI not displaced.  Abdomen:  Supple, nontender. Normal bowel sounds. No hepatomegaly.  Extremities:   Good distal pulses throughout. Tr lower extremity edema.  Musculoskeletal :moving all extremities.  Neuro:   alert and oriented x3.    Impression & Recommendations:  Problem # 1:  CHF (ICD-428.0) Patient with restrictive cardiomyopathy/  Volume status does not look too bad. I would like to check labs  before changing regimen.  Problem # 2:  GASTRIC ULCER (ICD-531.90) Concerning is her recent note of BRBPR.  Most likel y her hemorrhoids. Will check CBC and contact Lamont Snowball with labs.  Problem # 3:  ATRIAL FIBRILLATION (ICD-427.31) Rate control and ASA only.  May need to hol AS.  Other Orders: TLB-BMP (Basic Metabolic Panel-BMET) (80048-METABOL) TLB-BNP (B-Natriuretic Peptide) (83880-BNPR) TLB-CBC Platelet - w/Differential (85025-CBCD) TLB-TSH (Thyroid Stimulating Hormone) (60454-UJW)  Patient Instructions: 1)  Your physician has recommended you make the following change in your medication: Hold Cozaar until we call you back.

## 2011-02-20 NOTE — Progress Notes (Signed)
Summary: Schedule REV for rectal bleeding  Phone Note From Other Clinic   Caller: Provider Summary of Call: Dr. Tenny Craw called today @ 1645 reporting the pt has noted some BRBPR and her Hg has decreased to 9.2. She was not restarted on Coumadin. Elevated BUN/Cr today concerning for worsening heart failure, volume depletion and/or blood in UGI tract. We will contact the pt today and likely schedule an REV. Worsening bleeding or other worsening symptoms should lead to an ED evaluation and hospitalization with [facility] with GI and Cardiology consultation. Initial call taken by: Meryl Dare MD Clementeen Graham,  February 12, 2011 4:51 PM  Follow-up for Phone Call        I spoke with the patient sh will come in and see Willette Cluster RNP in the am at 8:30 Follow-up by: Darcey Nora RN, CGRN,  February 12, 2011 4:56 PM  Additional Follow-up for Phone Call Additional follow up Details #1::        Good. Additional Follow-up by: Meryl Dare MD Clementeen Graham,  February 13, 2011 8:46 AM

## 2011-02-21 LAB — CBC
HCT: 27.7 % — ABNORMAL LOW (ref 36.0–46.0)
HCT: 28.5 % — ABNORMAL LOW (ref 36.0–46.0)
HCT: 32.7 % — ABNORMAL LOW (ref 36.0–46.0)
Hemoglobin: 8.8 g/dL — ABNORMAL LOW (ref 12.0–15.0)
MCH: 31.2 pg (ref 26.0–34.0)
MCHC: 30.9 g/dL (ref 30.0–36.0)
MCV: 97.6 fL (ref 78.0–100.0)
MCV: 98.9 fL (ref 78.0–100.0)
Platelets: 267 10*3/uL (ref 150–400)
RBC: 2.8 MIL/uL — ABNORMAL LOW (ref 3.87–5.11)
RBC: 3.35 MIL/uL — ABNORMAL LOW (ref 3.87–5.11)
WBC: 6.5 10*3/uL (ref 4.0–10.5)
WBC: 8.8 10*3/uL (ref 4.0–10.5)

## 2011-02-21 LAB — POCT I-STAT, CHEM 8
Calcium, Ion: 1.19 mmol/L (ref 1.12–1.32)
Glucose, Bld: 101 mg/dL — ABNORMAL HIGH (ref 70–99)
Potassium: 3.5 mEq/L (ref 3.5–5.1)
TCO2: 29 mmol/L (ref 0–100)

## 2011-02-21 LAB — BASIC METABOLIC PANEL
BUN: 52 mg/dL — ABNORMAL HIGH (ref 6–23)
CO2: 37 mEq/L — ABNORMAL HIGH (ref 19–32)
Chloride: 102 mEq/L (ref 96–112)
Chloride: 92 mEq/L — ABNORMAL LOW (ref 96–112)
Chloride: 95 mEq/L — ABNORMAL LOW (ref 96–112)
Chloride: 98 mEq/L (ref 96–112)
Creatinine, Ser: 1.46 mg/dL — ABNORMAL HIGH (ref 0.4–1.2)
GFR calc Af Amer: 29 mL/min — ABNORMAL LOW (ref >60)
GFR calc Af Amer: 42 mL/min — ABNORMAL LOW (ref >60)
GFR calc Af Amer: 45 mL/min — ABNORMAL LOW (ref >60)
Glucose, Bld: 126 mg/dL — ABNORMAL HIGH (ref 70–99)
Potassium: 2.6 mEq/L — CL (ref 3.5–5.1)
Potassium: 2.8 mEq/L — ABNORMAL LOW (ref 3.5–5.1)
Potassium: 3.6 mEq/L (ref 3.5–5.1)
Potassium: 3.6 mEq/L (ref 3.5–5.1)

## 2011-02-21 LAB — DIFFERENTIAL
Basophils Absolute: 0 10*3/uL (ref 0.0–0.1)
Eosinophils Absolute: 0.3 10*3/uL (ref 0.0–0.7)
Lymphocytes Relative: 10 % — ABNORMAL LOW (ref 12–46)
Lymphs Abs: 0.8 10*3/uL (ref 0.7–4.0)
Neutrophils Relative %: 78 % — ABNORMAL HIGH (ref 43–77)

## 2011-02-21 LAB — COMPREHENSIVE METABOLIC PANEL
ALT: 11 U/L (ref 0–35)
CO2: 28 mEq/L (ref 19–32)
Calcium: 9.3 mg/dL (ref 8.4–10.5)
Creatinine, Ser: 1.38 mg/dL — ABNORMAL HIGH (ref 0.4–1.2)
GFR calc non Af Amer: 37 mL/min — ABNORMAL LOW (ref >60)
Glucose, Bld: 98 mg/dL (ref 70–99)

## 2011-02-21 LAB — URINALYSIS, ROUTINE W REFLEX MICROSCOPIC
Glucose, UA: NEGATIVE mg/dL
Hgb urine dipstick: NEGATIVE
pH: 5.5 (ref 5.0–8.0)

## 2011-02-21 LAB — IRON AND TIBC: Iron: 38 ug/dL — ABNORMAL LOW (ref 42–135)

## 2011-02-21 LAB — PROTIME-INR
INR: 1.07 (ref 0.00–1.49)
Prothrombin Time: 14.1 seconds (ref 11.6–15.2)

## 2011-02-21 LAB — URINE MICROSCOPIC-ADD ON

## 2011-02-21 LAB — FERRITIN: Ferritin: 578 ng/mL — ABNORMAL HIGH (ref 10–291)

## 2011-02-21 LAB — URIC ACID: Uric Acid, Serum: 5.8 mg/dL (ref 2.4–7.0)

## 2011-02-22 LAB — PROCALCITONIN: Procalcitonin: 0.99 ng/mL

## 2011-02-22 LAB — GLUCOSE, CAPILLARY
Glucose-Capillary: 100 mg/dL — ABNORMAL HIGH (ref 70–99)
Glucose-Capillary: 102 mg/dL — ABNORMAL HIGH (ref 70–99)
Glucose-Capillary: 103 mg/dL — ABNORMAL HIGH (ref 70–99)
Glucose-Capillary: 144 mg/dL — ABNORMAL HIGH (ref 70–99)
Glucose-Capillary: 91 mg/dL (ref 70–99)
Glucose-Capillary: 98 mg/dL (ref 70–99)
Glucose-Capillary: 99 mg/dL (ref 70–99)
Glucose-Capillary: 99 mg/dL (ref 70–99)
Glucose-Capillary: 112 mg/dL — ABNORMAL HIGH (ref 70–99)
Glucose-Capillary: 112 mg/dL — ABNORMAL HIGH (ref 70–99)
Glucose-Capillary: 114 mg/dL — ABNORMAL HIGH (ref 70–99)
Glucose-Capillary: 115 mg/dL — ABNORMAL HIGH (ref 70–99)
Glucose-Capillary: 116 mg/dL — ABNORMAL HIGH (ref 70–99)
Glucose-Capillary: 119 mg/dL — ABNORMAL HIGH (ref 70–99)
Glucose-Capillary: 123 mg/dL — ABNORMAL HIGH (ref 70–99)
Glucose-Capillary: 124 mg/dL — ABNORMAL HIGH (ref 70–99)
Glucose-Capillary: 126 mg/dL — ABNORMAL HIGH (ref 70–99)
Glucose-Capillary: 128 mg/dL — ABNORMAL HIGH (ref 70–99)
Glucose-Capillary: 129 mg/dL — ABNORMAL HIGH (ref 70–99)
Glucose-Capillary: 135 mg/dL — ABNORMAL HIGH (ref 70–99)
Glucose-Capillary: 135 mg/dL — ABNORMAL HIGH (ref 70–99)
Glucose-Capillary: 136 mg/dL — ABNORMAL HIGH (ref 70–99)
Glucose-Capillary: 142 mg/dL — ABNORMAL HIGH (ref 70–99)
Glucose-Capillary: 143 mg/dL — ABNORMAL HIGH (ref 70–99)
Glucose-Capillary: 144 mg/dL — ABNORMAL HIGH (ref 70–99)
Glucose-Capillary: 146 mg/dL — ABNORMAL HIGH (ref 70–99)
Glucose-Capillary: 152 mg/dL — ABNORMAL HIGH (ref 70–99)
Glucose-Capillary: 152 mg/dL — ABNORMAL HIGH (ref 70–99)

## 2011-02-22 LAB — CBC
HCT: 25.4 % — ABNORMAL LOW (ref 36.0–46.0)
HCT: 22.1 % — ABNORMAL LOW (ref 36.0–46.0)
HCT: 22.5 % — ABNORMAL LOW (ref 36.0–46.0)
HCT: 23 % — ABNORMAL LOW (ref 36.0–46.0)
HCT: 23 % — ABNORMAL LOW (ref 36.0–46.0)
HCT: 27.7 % — ABNORMAL LOW (ref 36.0–46.0)
Hemoglobin: 8 g/dL — ABNORMAL LOW (ref 12.0–15.0)
Hemoglobin: 7.3 g/dL — ABNORMAL LOW (ref 12.0–15.0)
Hemoglobin: 8.8 g/dL — ABNORMAL LOW (ref 12.0–15.0)
MCH: 31.2 pg (ref 26.0–34.0)
MCHC: 31.5 g/dL (ref 30.0–36.0)
MCHC: 31.1 g/dL (ref 30.0–36.0)
MCHC: 31.7 g/dL (ref 30.0–36.0)
MCHC: 32.6 g/dL (ref 30.0–36.0)
MCHC: 32.9 g/dL (ref 30.0–36.0)
MCHC: 33 g/dL (ref 30.0–36.0)
MCV: 101.1 fL — ABNORMAL HIGH (ref 78.0–100.0)
MCV: 94.4 fL (ref 78.0–100.0)
MCV: 99.1 fL (ref 78.0–100.0)
Platelets: 104 10*3/uL — ABNORMAL LOW (ref 150–400)
Platelets: 112 10*3/uL — ABNORMAL LOW (ref 150–400)
Platelets: 113 10*3/uL — ABNORMAL LOW (ref 150–400)
Platelets: 115 10*3/uL — ABNORMAL LOW (ref 150–400)
Platelets: 185 10*3/uL (ref 150–400)
RBC: 2.54 MIL/uL — ABNORMAL LOW (ref 3.87–5.11)
RBC: 2.34 MIL/uL — ABNORMAL LOW (ref 3.87–5.11)
RBC: 2.73 MIL/uL — ABNORMAL LOW (ref 3.87–5.11)
RDW: 18.3 % — ABNORMAL HIGH (ref 11.5–15.5)
RDW: 18.6 % — ABNORMAL HIGH (ref 11.5–15.5)
RDW: 18.7 % — ABNORMAL HIGH (ref 11.5–15.5)
RDW: 19.4 % — ABNORMAL HIGH (ref 11.5–15.5)
RDW: 19.7 % — ABNORMAL HIGH (ref 11.5–15.5)
WBC: 7.4 10*3/uL (ref 4.0–10.5)
WBC: 10.7 10*3/uL — ABNORMAL HIGH (ref 4.0–10.5)
WBC: 11.2 10*3/uL — ABNORMAL HIGH (ref 4.0–10.5)
WBC: 11.3 10*3/uL — ABNORMAL HIGH (ref 4.0–10.5)
WBC: 11.7 10*3/uL — ABNORMAL HIGH (ref 4.0–10.5)
WBC: 13.1 10*3/uL — ABNORMAL HIGH (ref 4.0–10.5)
WBC: 9.3 10*3/uL (ref 4.0–10.5)
WBC: 9.5 10*3/uL (ref 4.0–10.5)

## 2011-02-22 LAB — CARDIAC PANEL(CRET KIN+CKTOT+MB+TROPI)
CK, MB: 1.8 ng/mL (ref 0.3–4.0)
CK, MB: 2.5 ng/mL (ref 0.3–4.0)
Relative Index: INVALID (ref 0.0–2.5)
Relative Index: INVALID (ref 0.0–2.5)
Total CK: 67 U/L (ref 7–177)
Troponin I: 0.09 ng/mL — ABNORMAL HIGH (ref 0.00–0.06)
Troponin I: 0.09 ng/mL — ABNORMAL HIGH (ref 0.00–0.06)

## 2011-02-22 LAB — PREPARE RBC (CROSSMATCH)

## 2011-02-22 LAB — BASIC METABOLIC PANEL WITH GFR
Calcium: 9.3 mg/dL (ref 8.4–10.5)
Chloride: 117 meq/L — ABNORMAL HIGH (ref 96–112)
Creatinine, Ser: 1.41 mg/dL — ABNORMAL HIGH (ref 0.4–1.2)
GFR calc Af Amer: 44 mL/min — ABNORMAL LOW (ref >60)
GFR calc non Af Amer: 36 mL/min — ABNORMAL LOW (ref >60)

## 2011-02-22 LAB — RENAL FUNCTION PANEL
Albumin: 3.6 g/dL (ref 3.5–5.2)
BUN: 40 mg/dL — ABNORMAL HIGH (ref 6–23)
Chloride: 98 mEq/L (ref 96–112)
Glucose, Bld: 107 mg/dL — ABNORMAL HIGH (ref 70–99)
Phosphorus: 2.9 mg/dL (ref 2.3–4.6)
Potassium: 3.5 mEq/L (ref 3.5–5.1)
Sodium: 139 mEq/L (ref 135–145)

## 2011-02-22 LAB — CLOSTRIDIUM DIFFICILE EIA: C difficile Toxins A+B, EIA: NEGATIVE

## 2011-02-22 LAB — BASIC METABOLIC PANEL
BUN: 55 mg/dL — ABNORMAL HIGH (ref 6–23)
BUN: 72 mg/dL — ABNORMAL HIGH (ref 6–23)
CO2: 28 mEq/L (ref 19–32)
CO2: 30 mEq/L (ref 19–32)
Chloride: 110 mEq/L (ref 96–112)
GFR calc Af Amer: 50 mL/min — ABNORMAL LOW (ref >60)
GFR calc non Af Amer: 41 mL/min — ABNORMAL LOW (ref >60)
Glucose, Bld: 101 mg/dL — ABNORMAL HIGH (ref 70–99)
Glucose, Bld: 118 mg/dL — ABNORMAL HIGH (ref 70–99)
Glucose, Bld: 138 mg/dL — ABNORMAL HIGH (ref 70–99)
Potassium: 3.7 mEq/L (ref 3.5–5.1)
Potassium: 3.5 mEq/L (ref 3.5–5.1)
Potassium: 3.7 mEq/L (ref 3.5–5.1)
Sodium: 141 mEq/L (ref 135–145)
Sodium: 153 mEq/L — ABNORMAL HIGH (ref 135–145)

## 2011-02-22 LAB — COMPREHENSIVE METABOLIC PANEL
ALT: 66 U/L — ABNORMAL HIGH (ref 0–35)
AST: 118 U/L — ABNORMAL HIGH (ref 0–37)
Albumin: 3 g/dL — ABNORMAL LOW (ref 3.5–5.2)
Alkaline Phosphatase: 52 U/L (ref 39–117)
Alkaline Phosphatase: 56 U/L (ref 39–117)
BUN: 101 mg/dL — ABNORMAL HIGH (ref 6–23)
Calcium: 8.4 mg/dL (ref 8.4–10.5)
Chloride: 112 mEq/L (ref 96–112)
Creatinine, Ser: 1.49 mg/dL — ABNORMAL HIGH (ref 0.4–1.2)
GFR calc Af Amer: 27 mL/min — ABNORMAL LOW (ref >60)
GFR calc non Af Amer: 34 mL/min — ABNORMAL LOW (ref >60)
Glucose, Bld: 126 mg/dL — ABNORMAL HIGH (ref 70–99)
Glucose, Bld: 164 mg/dL — ABNORMAL HIGH (ref 70–99)
Potassium: 3.4 mEq/L — ABNORMAL LOW (ref 3.5–5.1)
Potassium: 4.2 mEq/L (ref 3.5–5.1)
Sodium: 134 mEq/L — ABNORMAL LOW (ref 135–145)
Total Bilirubin: 0.8 mg/dL (ref 0.3–1.2)
Total Protein: 5.6 g/dL — ABNORMAL LOW (ref 6.0–8.3)

## 2011-02-22 LAB — CROSSMATCH
ABO/RH(D): O POS
Antibody Screen: NEGATIVE

## 2011-02-22 LAB — URINE CULTURE: Culture  Setup Time: 201109111811

## 2011-02-22 LAB — HEMOGLOBIN A1C: Mean Plasma Glucose: 108 mg/dL (ref <117)

## 2011-02-22 LAB — CULTURE, BLOOD (ROUTINE X 2)
Culture: NO GROWTH
Culture: NO GROWTH

## 2011-02-22 LAB — IRON AND TIBC
Iron: 85 ug/dL (ref 42–135)
TIBC: 272 ug/dL (ref 250–470)
TIBC: 350 ug/dL (ref 250–470)

## 2011-02-22 LAB — URINALYSIS, ROUTINE W REFLEX MICROSCOPIC
Nitrite: NEGATIVE
Specific Gravity, Urine: 1.02 (ref 1.005–1.030)
Urobilinogen, UA: 0.2 mg/dL (ref 0.0–1.0)
pH: 5 (ref 5.0–8.0)

## 2011-02-22 LAB — LIPASE, BLOOD: Lipase: 19 U/L (ref 11–59)

## 2011-02-22 LAB — DIFFERENTIAL
Lymphocytes Relative: 11 % — ABNORMAL LOW (ref 12–46)
Lymphs Abs: 0.8 10*3/uL (ref 0.7–4.0)
Monocytes Absolute: 0.9 10*3/uL (ref 0.1–1.0)
Monocytes Relative: 12 % (ref 3–12)
Neutro Abs: 5.6 10*3/uL (ref 1.7–7.7)
Neutrophils Relative %: 75 % (ref 43–77)

## 2011-02-22 LAB — POCT I-STAT, CHEM 8
BUN: >140 mg/dL — ABNORMAL HIGH (ref 6–23)
Calcium, Ion: 1.14 mmol/L (ref 1.12–1.32)
Chloride: 102 mEq/L (ref 96–112)
Creatinine, Ser: 1.9 mg/dL — ABNORMAL HIGH (ref 0.4–1.2)
Glucose, Bld: 162 mg/dL — ABNORMAL HIGH (ref 70–99)
HCT: 22 % — ABNORMAL LOW (ref 36.0–46.0)
Hemoglobin: 7.5 g/dL — ABNORMAL LOW (ref 12.0–15.0)
Potassium: 4.3 meq/L (ref 3.5–5.1)
Sodium: 138 meq/L (ref 135–145)
TCO2: 24 mmol/L (ref 0–100)

## 2011-02-22 LAB — PREPARE FRESH FROZEN PLASMA

## 2011-02-22 LAB — PTH, INTACT AND CALCIUM
Calcium, Total (PTH): 9.1 mg/dL (ref 8.4–10.5)
PTH: 165.1 pg/mL — ABNORMAL HIGH (ref 14.0–72.0)

## 2011-02-22 LAB — POCT HEMOGLOBIN-HEMACUE
Hemoglobin: 10 g/dL — ABNORMAL LOW (ref 12.0–15.0)
Hemoglobin: 10.6 g/dL — ABNORMAL LOW (ref 12.0–15.0)
Hemoglobin: 9 g/dL — ABNORMAL LOW (ref 12.0–15.0)

## 2011-02-22 LAB — URINE MICROSCOPIC-ADD ON

## 2011-02-22 LAB — MAGNESIUM
Magnesium: 2.3 mg/dL (ref 1.5–2.5)
Magnesium: 2.1 mg/dL (ref 1.5–2.5)

## 2011-02-22 LAB — PROTIME-INR
INR: 6.7 (ref 0.00–1.49)
Prothrombin Time: 15 seconds (ref 11.6–15.2)

## 2011-02-22 LAB — LACTIC ACID, PLASMA: Lactic Acid, Venous: 0.8 mmol/L (ref 0.5–2.2)

## 2011-02-22 LAB — APTT: aPTT: 51 seconds — ABNORMAL HIGH (ref 24–37)

## 2011-02-24 LAB — IRON AND TIBC
Iron: 60 ug/dL (ref 42–135)
UIBC: 246 ug/dL

## 2011-02-24 LAB — POCT HEMOGLOBIN-HEMACUE: Hemoglobin: 9.7 g/dL — ABNORMAL LOW (ref 12.0–15.0)

## 2011-02-25 LAB — IRON AND TIBC
Iron: 117 ug/dL (ref 42–135)
Saturation Ratios: 36 % (ref 20–55)
TIBC: 322 ug/dL (ref 250–470)
UIBC: 205 ug/dL

## 2011-02-25 LAB — POCT HEMOGLOBIN-HEMACUE: Hemoglobin: 10 g/dL — ABNORMAL LOW (ref 12.0–15.0)

## 2011-02-27 ENCOUNTER — Other Ambulatory Visit: Payer: Self-pay | Admitting: Nephrology

## 2011-02-27 ENCOUNTER — Encounter (HOSPITAL_COMMUNITY): Payer: Medicare Other

## 2011-02-27 ENCOUNTER — Other Ambulatory Visit: Payer: Self-pay

## 2011-02-27 LAB — IRON AND TIBC
Iron: 66 ug/dL (ref 42–135)
Saturation Ratios: 21 % (ref 20–55)
TIBC: 312 ug/dL (ref 250–470)
UIBC: 246 ug/dL
UIBC: 215 ug/dL

## 2011-02-27 LAB — RENAL FUNCTION PANEL
Albumin: 3.7 g/dL (ref 3.5–5.2)
BUN: 76 mg/dL — ABNORMAL HIGH (ref 6–23)
Creatinine, Ser: 1.33 mg/dL — ABNORMAL HIGH (ref 0.4–1.2)
Phosphorus: 3.6 mg/dL (ref 2.3–4.6)

## 2011-02-27 LAB — FERRITIN: Ferritin: 232 ng/mL (ref 10–291)

## 2011-02-27 LAB — POCT HEMOGLOBIN-HEMACUE: Hemoglobin: 9.5 g/dL — ABNORMAL LOW (ref 12.0–15.0)

## 2011-02-28 LAB — POCT HEMOGLOBIN-HEMACUE
Hemoglobin: 8.8 g/dL — ABNORMAL LOW (ref 12.0–15.0)
Hemoglobin: 10.6 g/dL — ABNORMAL LOW (ref 12.0–15.0)

## 2011-02-28 LAB — IRON AND TIBC
Iron: 71 ug/dL (ref 42–135)
TIBC: 311 ug/dL (ref 250–470)
UIBC: 205 ug/dL

## 2011-02-28 LAB — FERRITIN: Ferritin: 219 ng/mL (ref 10–291)

## 2011-03-01 ENCOUNTER — Other Ambulatory Visit: Payer: Self-pay | Admitting: Internal Medicine

## 2011-03-01 DIAGNOSIS — M542 Cervicalgia: Secondary | ICD-10-CM

## 2011-03-03 ENCOUNTER — Ambulatory Visit
Admission: RE | Admit: 2011-03-03 | Discharge: 2011-03-03 | Disposition: A | Discharge status: Home / Self Care | Payer: Medicare Other | Source: Ambulatory Visit | Attending: Internal Medicine | Admitting: Internal Medicine

## 2011-03-03 DIAGNOSIS — M542 Cervicalgia: Secondary | ICD-10-CM

## 2011-03-04 LAB — RENAL FUNCTION PANEL
Albumin: 3.8 g/dL (ref 3.5–5.2)
BUN: 93 mg/dL — ABNORMAL HIGH (ref 6–23)
Calcium: 9.2 mg/dL (ref 8.4–10.5)
Creatinine, Ser: 1.96 mg/dL — ABNORMAL HIGH (ref 0.4–1.2)
Phosphorus: 4.1 mg/dL (ref 2.3–4.6)
Potassium: 3.4 mEq/L — ABNORMAL LOW (ref 3.5–5.1)

## 2011-03-04 LAB — PTH, INTACT AND CALCIUM
Calcium, Total (PTH): 9.2 mg/dL (ref 8.4–10.5)
PTH: 137.5 pg/mL — ABNORMAL HIGH (ref 14.0–72.0)

## 2011-03-05 ENCOUNTER — Encounter (HOSPITAL_COMMUNITY): Payer: Medicare Other

## 2011-03-05 ENCOUNTER — Other Ambulatory Visit: Payer: Self-pay | Admitting: Nephrology

## 2011-03-09 ENCOUNTER — Other Ambulatory Visit: Payer: Self-pay | Admitting: Internal Medicine

## 2011-03-12 ENCOUNTER — Encounter (HOSPITAL_COMMUNITY): Payer: Medicare Other | Attending: Nephrology

## 2011-03-12 ENCOUNTER — Other Ambulatory Visit: Payer: Self-pay | Admitting: Nephrology

## 2011-03-12 DIAGNOSIS — R0989 Other specified symptoms and signs involving the circulatory and respiratory systems: Secondary | ICD-10-CM | POA: Insufficient documentation

## 2011-03-12 DIAGNOSIS — R0609 Other forms of dyspnea: Secondary | ICD-10-CM | POA: Insufficient documentation

## 2011-03-12 DIAGNOSIS — N183 Chronic kidney disease, stage 3 unspecified: Secondary | ICD-10-CM | POA: Insufficient documentation

## 2011-03-12 DIAGNOSIS — D638 Anemia in other chronic diseases classified elsewhere: Secondary | ICD-10-CM | POA: Insufficient documentation

## 2011-03-13 LAB — POCT HEMOGLOBIN-HEMACUE: Hemoglobin: 9.6 g/dL — ABNORMAL LOW (ref 12.0–15.0)

## 2011-03-13 LAB — IRON AND TIBC: UIBC: 179 ug/dL

## 2011-03-13 NOTE — Progress Notes (Signed)
Summary: Triage  Phone Note Call from Patient Call back at Home Phone [phone]   Caller: Patient Call For: Willette Cluster Reason for Call: Talk to Nurse Summary of Call: Pt. came by and had been to Short Stay today and was told to come by after visit. Her hemoglobin was 8.6 Initial call taken by: Karna Christmas,  February 20, 2011 11:18 AM  Follow-up for Phone Call        Patient calling to let us know her hgb was 8.6 today at short stay [facility]. States she feels good, no more bleeding and no SOB.  Follow-up by: Jesse Fall RN,  February 20, 2011 12:06 PM  Additional Follow-up for Phone Call Additional follow up Details #1::        Will check hemoglobin next week. Additional Follow-up by: Willette Cluster NP,  March 07, 2011 1:15 PM

## 2011-03-14 LAB — POCT HEMOGLOBIN-HEMACUE: Hemoglobin: 12.1 g/dL (ref 12.0–15.0)

## 2011-03-14 LAB — IRON AND TIBC
Iron: 82 ug/dL (ref 42–135)
TIBC: 299 ug/dL (ref 250–470)

## 2011-03-16 LAB — IRON AND TIBC
Iron: 86 ug/dL (ref 42–135)
Saturation Ratios: 31 % (ref 20–55)
TIBC: 278 ug/dL (ref 250–470)
UIBC: 192 ug/dL

## 2011-03-16 LAB — RENAL FUNCTION PANEL
CO2: 37 mEq/L — ABNORMAL HIGH (ref 19–32)
GFR calc non Af Amer: 41 mL/min — ABNORMAL LOW (ref >60)
Glucose, Bld: 89 mg/dL (ref 70–99)
Potassium: 3.7 mEq/L (ref 3.5–5.1)
Sodium: 144 mEq/L (ref 135–145)

## 2011-03-16 LAB — POCT HEMOGLOBIN-HEMACUE: Hemoglobin: 11.3 g/dL — ABNORMAL LOW (ref 12.0–15.0)

## 2011-03-17 LAB — IRON AND TIBC: Saturation Ratios: 36 % (ref 20–55)

## 2011-03-17 LAB — POCT HEMOGLOBIN-HEMACUE
Hemoglobin: 11.4 g/dL — ABNORMAL LOW (ref 12.0–15.0)
Hemoglobin: 11.3 g/dL — ABNORMAL LOW (ref 12.0–15.0)

## 2011-03-17 LAB — FERRITIN: Ferritin: 459 ng/mL — ABNORMAL HIGH (ref 10–291)

## 2011-03-18 LAB — POCT HEMOGLOBIN-HEMACUE: Hemoglobin: 11.2 g/dL — ABNORMAL LOW (ref 12.0–15.0)

## 2011-03-18 LAB — IRON AND TIBC
Saturation Ratios: 35 % (ref 20–55)
TIBC: 286 ug/dL (ref 250–470)

## 2011-03-19 ENCOUNTER — Encounter (HOSPITAL_COMMUNITY): Payer: Medicare Other

## 2011-03-19 ENCOUNTER — Other Ambulatory Visit: Payer: Self-pay | Admitting: Nephrology

## 2011-03-19 ENCOUNTER — Encounter: Payer: Self-pay | Admitting: Gastroenterology

## 2011-03-19 LAB — POCT HEMOGLOBIN-HEMACUE: Hemoglobin: 11 g/dL — ABNORMAL LOW (ref 12.0–15.0)

## 2011-03-19 LAB — IRON AND TIBC
Iron: 102 ug/dL (ref 42–135)
Saturation Ratios: 35 % (ref 20–55)
TIBC: 292 ug/dL (ref 250–470)

## 2011-03-20 LAB — FERRITIN: Ferritin: 364 ng/mL — ABNORMAL HIGH (ref 10–291)

## 2011-03-20 LAB — IRON AND TIBC
Saturation Ratios: 40 % (ref 20–55)
TIBC: 274 ug/dL (ref 250–470)
UIBC: 164 ug/dL

## 2011-03-21 LAB — POCT HEMOGLOBIN-HEMACUE
Hemoglobin: 10 g/dL — ABNORMAL LOW (ref 12.0–15.0)
Hemoglobin: 9.6 g/dL — ABNORMAL LOW (ref 12.0–15.0)

## 2011-03-21 LAB — IRON AND TIBC: Saturation Ratios: 47 % (ref 20–55)

## 2011-03-22 LAB — IRON AND TIBC
Iron: 74 ug/dL (ref 42–135)
Saturation Ratios: 22 % (ref 20–55)

## 2011-03-22 LAB — POCT HEMOGLOBIN-HEMACUE: Hemoglobin: 11 g/dL — ABNORMAL LOW (ref 12.0–15.0)

## 2011-03-26 ENCOUNTER — Encounter (HOSPITAL_COMMUNITY): Payer: Medicare Other

## 2011-03-26 ENCOUNTER — Other Ambulatory Visit: Payer: Self-pay | Admitting: Nephrology

## 2011-03-27 LAB — IRON AND TIBC
Iron: 45 ug/dL (ref 42–135)
UIBC: 269 ug/dL

## 2011-03-27 LAB — FERRITIN: Ferritin: 90 ng/mL (ref 10–291)

## 2011-04-03 ENCOUNTER — Encounter (HOSPITAL_COMMUNITY): Payer: Medicare Other

## 2011-04-03 ENCOUNTER — Other Ambulatory Visit: Payer: Self-pay | Admitting: Internal Medicine

## 2011-04-03 ENCOUNTER — Other Ambulatory Visit: Payer: Self-pay | Admitting: Nephrology

## 2011-04-09 ENCOUNTER — Other Ambulatory Visit: Payer: Self-pay | Admitting: Gastroenterology

## 2011-04-09 ENCOUNTER — Other Ambulatory Visit: Payer: Self-pay | Admitting: Internal Medicine

## 2011-04-09 NOTE — Telephone Encounter (Signed)
Uric Acid 274.9 

## 2011-04-10 ENCOUNTER — Other Ambulatory Visit: Payer: Self-pay | Admitting: Nephrology

## 2011-04-10 ENCOUNTER — Encounter (HOSPITAL_COMMUNITY): Payer: Medicare Other | Attending: Nephrology

## 2011-04-10 DIAGNOSIS — N183 Chronic kidney disease, stage 3 unspecified: Secondary | ICD-10-CM | POA: Insufficient documentation

## 2011-04-10 DIAGNOSIS — D638 Anemia in other chronic diseases classified elsewhere: Secondary | ICD-10-CM | POA: Insufficient documentation

## 2011-04-13 ENCOUNTER — Encounter: Payer: Self-pay | Admitting: Internal Medicine

## 2011-04-17 ENCOUNTER — Other Ambulatory Visit: Payer: Self-pay | Admitting: Nephrology

## 2011-04-17 ENCOUNTER — Encounter (HOSPITAL_COMMUNITY): Payer: Medicare Other

## 2011-04-17 LAB — FERRITIN: Ferritin: 706 ng/mL — ABNORMAL HIGH (ref 10–291)

## 2011-04-17 LAB — IRON AND TIBC
Iron: 79 ug/dL (ref 42–135)
Saturation Ratios: 27 % (ref 20–55)
TIBC: 292 ug/dL (ref 250–470)
UIBC: 213 ug/dL

## 2011-04-18 LAB — POCT HEMOGLOBIN-HEMACUE: Hemoglobin: 11.4 g/dL — ABNORMAL LOW (ref 12.0–15.0)

## 2011-04-24 NOTE — Assessment & Plan Note (Signed)
[facility]                          CHRONIC HEART FAILURE NOTE   NAME:[name], [name] MAULTSBY                     MRN:          [id number]  DATE:08/10/2008                            DOB:          [birth year]    PRIMARY CARDIOLOGIST:  Pricilla Riffle, MD, [facility]   [name] is a delightful 77 year old Caucasian female with a history  of chronic dyspnea status post recent cardiac catheterization by Dr. Nicholes Mango that showed significant elevation of filling pressures, which  was quite restrictive cardiomyopathy.  [name] was referred here by  Dr. Tenny Craw for further management evaluation of her heart failure and  medication therapy.  [name] is accompanied by her husband today,  although he is not here in the clinic area with her; he is waiting in  the lobby for.  She is a very pleasant lady and has a history of  multiple medical problems.  She also has COPD and has been on home  oxygen.  She states that 2 L nasal cannula.  She also has a history of  chronic atrial fib, has had significant GI bleeding issues.  Recently,  this is a significant problem, which her hemoglobin drifted down to 8.4  and the patient was evaluated by Dr. Russella Dar and diagnosed with  hemorrhoids.  Her Coumadin is still on hold and she is to follow up with  Dr. Tenny Craw for reinitiation of her anticoagulation therapy.  As stated,  [name] recently underwent a cardiac catheterization by Dr.  Gala Romney, at which time, he elected to push her diuretics and made  arrangements to switch her over to [facility] 40 in the morning, 20 at  night to see how the patient responds.  I have talked with [name]  in review her medications.  She states she stopped that torsemide was  the same dose as her furosemide, so she has been taking 80 mg of  torsemide twice a day (she was on furosemide 20 mg 4 tablets twice a  day).  She has been doing this since her catheterization on that August 06, 2008.  [name] denies any lightheadedness or dizziness.  She  has chronic dyspnea with minimal exertion, occasional nonproductive  cough.  She denies any orthopnea or PND.  She has a history of  obstructive sleep apnea with partial compliance to CPAP.  She states she  will put it on when she goes to sleep, but during the night she gets up  to go to the bathroom, she takes it off and she never puts it back on.  Otherwise, she states she feels about the same today.  I cannot really  tell any difference in urine output with the Demadex.   PAST MEDICAL HISTORY:  1. Congestive heart failure secondary to restrictive cardiomyopathy      with a normal EF, status post recent cardiac catheterization      showing significant 1-vessel CAD in the OM2 with markedly elevated      biventricular pressors suggestive of restrictive cardiomyopathy and      moderate pulmonary hypertension with both  pulmonary arterial and      pulmonary venous component.  The patient's pulmonary capillary      wedge pressure ranged 32-34 with a Fick cardiac index 5 point      liters per minute.  Pulmonary vascular resistance was 3.7 Wood      units.  2. Chronic atrial fib requiring chronic anticoagulation therapy.  3. Recent GI bleed diagnosed with hemorrhoids status post blood      transfusion.  4. COPD with chronic O2 use.  5. Obstructive sleep apnea with partial compliance to CPAP.  6. Dyslipidemia.  7. Obesity.  8. Hypertension.  9. Previous history of CVA x2 with residual visual deficits and      unsteady gait at times.  10.History of peptic ulcer disease.  11.Diabetes type 2.  12.Status post appendectomy.  13.Treated hypothyroidism.  14.Gout.  15.Status post hysterectomy.   REVIEW OF SYSTEMS:  As stated above; otherwise, negative.   CURRENT MEDICATIONS:  1. Ferrets 150 mg 2 tablets b.i.d.  2. Vitamin C daily.  3. Nexium 40 mg b.i.d.  4. Cozaar 100 mg daily.  5. Metoprolol XL 100 mg.  The patient takes  half a tablet daily.  6. Allopurinol 150 mg daily.  7. O2 two liters daily.  8. CPAP at bedtime.  9. Synthroid 0.075 mg daily.  10.Rocket study medication, currently on hold.  11.Warfarin, currently on hold.  12.Roxiprin 1 tablet daily.  13.Zocor 20 mg daily.  14.The patient should be taking Demadex 40 mg in the morning and 20 in      the evening.  The patient is taking 20 mg 4 tablets in the morning      and 4 in the evening.   PHYSICAL EXAMINATION:  VITAL SIGNS:  Weight 193 pounds, blood pressure  122/51 with a heart rate of 66.  GENERAL:  [name] is in no acute distress.  NECK:  JVD, around 10-12 cm at 45 triangle.  She has prominent CV waves.  LUNGS:  Clear to auscultation bilaterally.  CARDIOVASCULAR:  Reveals S1 and S2.  Irregular rhythm.  ABDOMEN:  Soft and nontender.  Positive bowel sounds.  Protuberant.  LOWER EXTREMITIES:  Without clubbing, cyanosis, or edema.  NEUROLOGIC:  Alert and oriented x3.  The patient ambulating with  assistance of O2 two liters nasal cannula.   IMPRESSION:  Congestive heart failure secondary to restrictive  cardiomyopathy with the patient taking higher doses of her diuretic been  recommended.  Without significant volume overload at this time, I am  going to go ahead and check her BMET and BMP on her today.  I have cut  her Demadex back to 20 mg 4 in the morning, 3 in the afternoon until I  get her blood work results, and I will call her and review her labs with  her make adjustments in medication as  needed.  Dr. Russella Dar has cleared the patient to resume her Coumadin.  We  will speak with Dr. Tenny Craw concerning that have the patient follow up with  Dr. Tenny Craw for further verification.      Dorian Pod, ACNP  Electronically Signed      Rollene Rotunda, MD, [facility]  Electronically Signed   MB/MedQ  DD: 08/10/2008  DT: 08/11/2008  Job #: [id number]

## 2011-04-24 NOTE — Assessment & Plan Note (Signed)
[facility]                            CARDIOLOGY OFFICE NOTE   NAME:[name], [name]                     MRN:          [id number]  DATE:01/08/2008                            DOB:          05-26-1929    IDENTIFICATION:  Ms. [name] is a 77 year old with a history of  diastolic dysfunction, atrial fibrillation, hypertension, dyspnea.  She  is also followed by Dr. Lona Kettle.  I last saw her in October.   She was seen by Dr. Alwyn Ren yesterday.  Over the past few weeks has not  been doing well.  She has been on a couple rounds of antibiotics for URI  (doxycycline).  Her breathing was getting better then started getting  worse over the past few days.  She has a productive cough with darkish  sputum, increased wheezes, increased shortness of breath.   When she was seen yesterday she was given a prescription for prednisone  20 b.i.d. Symbicort q.12, Avelox, Zantac, and her torsemide was  increased to 20 two tablets b.i.d. I think.  Today her breathing might  be a bit better.  She was seen now in the research clinic as she is part  of the ROCKET study.   CURRENT MEDICATIONS:  Now include Ferrex 150 two b.i.d., metoprolol 50  daily ER, torsemide 40 a.m. question 40 p.m., Synthroid 0.075, Cozaar  100.  The ROCKET study drug, O2 as directed.  CPAP, allopurinol 150,  tramadol p.r.n., Zantac 150, Avelox samples, Symbicort 2 puffs q.12  hours and prednisone 20 b.i.d.   PHYSICAL EXAM:  The patient is in no acute distress at rest.  Blood pressure is 111/48 pulse is 79, weight not taken.  O2 sats 89% on  room air increased to 93% on 2 liters.  Her lungs show diffuse wheezes with rales at the bases.  Cardiac exam irregularly irregular S1-S2.  Note no S3.  EXTREMITIES:  No edema.   Chest x-ray done yesterday shows worsening cardiomegaly, pulmonary  vascular congestion.  Mild interstitial edema/infiltrates.   IMPRESSION:  Shortness of breath.  The patient had  evidence of volume  overload and probably has a upper respiratory infection which may have  precipitated things.  Her torsemide increased to 2 pills b.i.d.  She has  not noted a marked increase in urine output, but again she only took one  dose last night.  She is on antibiotics and prednisone as well.   Today recommend getting some labs with a BMET and BNP, and I will be in  touch with her to keep on the same regimen until she hears for me.  She  has an appointment pulmonary to follow.   I will make sure patient has follow-up and one months' time valve be in  touch with her regarding her progress.     Pricilla Riffle, MD, [facility]  Electronically Signed    PVR/MedQ  DD: 01/08/2008  DT: 01/09/2008  Job #: [id number]   cc:   Titus Dubin. Alwyn Ren, MD,FACP,FCCP

## 2011-04-24 NOTE — Assessment & Plan Note (Signed)
 [facility]                            CARDIOLOGY OFFICE NOTE   NAME:[name], [name]                     MRN:          [id number]  DATE:05/20/2007                            DOB:          1929-03-19    IDENTIFICATION:  Patient is a 77 year old woman who I follow in  cardiology clinic. She is also followed in pulmonary. She has a history  of diastolic dysfunction, shortness of breath, atrial fibrillation,  hypertension.   I last saw her back in October.   In the interval she has been seen by pulmonary. They are titrating her  CPAP.   She notes that her breathing has gotten some better with the CPAP but it  has been tough over the past month or so.   She denies dizziness, no chest pain. Increased ankle swelling, she says  she does not urinate that much with the current dose of torsemide,  sometimes takes 2 at night as well.   CURRENT MEDICATIONS:  Include;  1. Ferrex 150 two b.i.d.  2. Allopurinol (recently restarted patient had an episode of gout).  3. Metoprolol succinate 50daily.  4. Cartia 300 daily.  5. Torsemide 40 q.a.m. 20 q.p.m.  6. Synthroid 0.075 daily.  7. Cozaar 100 daily.  8. Study drug (rocket).  9. O2 as directed.  10.Vitamin C.  11.CPAP.   PHYSICAL EXAMINATION:  On exam in the patient is in no distress. Blood  pressure 138/58, pulse is 59, weight 195.  NECK: JVP approximately 10.  LUNGS: Relatively clear.  CARDIAC EXAM: Regular rate and rhythm. S1, S2, no S3, no significant  murmurs.  ABDOMEN: Benign, obese.  EXTREMITIES: 1 + edema. Varicosities.   A 12-lead EKG shows atrial fibrillation with ventricular rate of 57  beats per minute. Right bundle branch block. T wave inversion  inferolaterally. No significant change from previous.   IMPRESSION:  1. Dyspnea, she does have probable increased volume on examination      again, at the time of catheterization her LVEDP was increased and      PA pressure was increased. I  would add Zaroxolyn 2.5 to her      regimen. I will get a BMET, BNP today, follow up with her on this.      Continue other medicines for now.  2. Atrial fibrillation, continue on rate control. Note Holter monitor      was done about a year ago, average heart rate was 53 beats per      minute. At her convenience we will go ahead and set up for another.      I will be in touch with her once cleared with her insurance.      Continue on Rocket study.  3. Hypertension, adequate again will follow as diuresed.   I will set follow up in clinic to reevaluate response.     Pricilla Riffle, MD, [facility]  Electronically Signed    PVR/MedQ  DD: 05/20/2007  DT: 05/20/2007  Job #: [id number]

## 2011-04-24 NOTE — Consult Note (Signed)
NAME:  [name], [name] NO.:  [id number]   MEDICAL RECORD NO.:  [id number]          PATIENT TYPE:  INP   LOCATION:  5152                         FACILITY:  [facility]   PHYSICIAN:  Gabrielle Dare. Janee Morn, M.D.DATE OF BIRTH:  [birth year]   DATE OF CONSULTATION:  04/09/2008  DATE OF DISCHARGE:                                 CONSULTATION   CHIEF COMPLAINT:  Umbilical abscess.   HISTORY OF PRESENT ILLNESS:  [name] is a 77 year old white female  who was admitted to the medical service with a 5-day history of  umbilical redness and purulent discharge.  She complains of localized  pain.  She denies recent injury to the area or recent surgery.  She has  no GI complaints.   PAST MEDICAL HISTORY:  Includes:  1. AFib.  2. Obstructive sleep apnea.  3. Gout.  4. Bronchitis.  5. Hypertension.  6. Hypothyroidism.   PAST SURGICAL HISTORY:  Appendectomy and oophorectomy.   FAMILY HISTORY:  Father and brother have heart disease.  Mother had  breast cancer.   ALLERGIES:  No known drug allergies .   MEDICATIONS:  Please refer to the [facility], but the patient takes Coumadin  under a study protocol and Avelox IV is ordered for IV antibiotics.   REVIEW OF SYSTEMS:  MUSCULOSKELETAL:  Localized pain as above.   PHYSICAL EXAMINATION:  VITAL SIGNS: Temperature 98.8, blood pressure  109/49, respirations 12, and heart rate 51.  GENERAL: She is awake and alert.  NECK: Supple and nontender.  LUNGS: Clear to auscultation with no wheezing.  HEART: Regular.  No murmurs are heard.  ABDOMEN: Soft.  She does have periumbilical erythema with some  induration, superior to the umbilicus.  The umbilicus itself has a wound  in its depth with purulent discharge.  The wound was irrigated of this  bloody purulent discharge and it does drain well.  After it was cleaned  out thoroughly, 1-inch iodoform packing was placed along with a bulky  dressing.  Additional white blood cell count 7.4  and INR  2.7.   RECOMMENDATIONS:  Change iodoform bandage b.i.d. to facilitate drainage.  She is placed on IV antibiotics.  If the patient does not improve, we  will need to reverse her anticoagulation and further incise and drain  this abscess in the operating room.  For now, it is well drained and we  will follow her closely.      Gabrielle Dare Janee Morn, M.D.  Electronically Signed     BET/MEDQ  D:  04/09/2008  T:  04/10/2008  Job:  [id number]   cc:   Willow Ora, MD

## 2011-04-24 NOTE — Assessment & Plan Note (Signed)
[facility]                            CARDIOLOGY OFFICE NOTE   NAME:[name], [name] COURT                     MRN:          [id number]  DATE:08/02/2008                            DOB:          September 28, 1929    IDENTIFICATION:  [name] is a 77 year old woman who I follow in  clinic.  She has a history of CAD moderate by cath in 2005, pulmonary  hypertension felt secondary to diastolic dysfunction, atrial  fibrillation, hypertension, history of gastric ulcers.  Also, a history  of anemia, CVA in 2002 (the dizziness/gait abnormalities).  Note, I do  not have a good hospital discharge or neuro clinic note for this.   In addition, the patient has COPD and has been on oxygen.   Note, the patient again has had significant GI bleeding issues.  Currently, she has had her anticoagulation held because of some lower GI  bleeding.  She is due to be seen by Dr. Christella Hartigan next week.   The patient continues to complain of shortness of breath.  She is on  oxygen.  Some days, her breathing is better than others.  She tries to  control her fluids and salt intake.   Her current medicines include Ferrex 150 b.i.d., Rocket study drug,  which is on hold, vitamin C, Nexium 40 b.i.d., Cozaar 100 daily, Lasix  80 a.m., 60 p.m., metoprolol XL 50 daily, allopurinol 150 daily, O2 2 L,  CPAP. Synthroid 0.075.   ALLERGIES:  None.   PAST MEDICAL HISTORY:  As noted above.   FAMILY HISTORY:  Father had MI, CVA.  Mother had CABG, died, sister with  diabetes.   REVIEW OF SYSTEMS:  All systems reviewed negative to the above problem  except as noted above.   PHYSICAL EXAMINATION:  GENERAL:  On exam, the patient is sitting with no  acute distress at rest with her oxygen.  VITAL SIGNS:  Blood pressure on last check in May, I do not have today's  as they were not recorded, was 138/59, pulse is 71 and irregular.  NECK:  JVP appears to be some increased at approximately 10 to 11.  LUNGS:  Clear.  CARDIAC:  Irregular rate and rhythm.  S1 and S2.  Grade 1/6 systolic  murmur. No S3.  ABDOMEN:  Benign and obese.  EXTREMITIES:  Trace to 1+ edema.   A 12-lead EKG on last visit, atrial fibrillation 73 beats per minute,  right bundle-branch block.  T-wave inversion inferolaterally which is  old.   IMPRESSION:  1. [name] is a 77 year old woman with multiple medical problems.      When I looked at her echoes in the past, she has markedly dilated      atrium.  Main concern is that she has a restrictive cardiomyopathy.      The pressures were elevated back in 2005.  She also had moderate      CAD at that time.  Question is actually and her pressures were felt      to represent diastolic dysfunction in the setting of atrial      fibrillation.  I would like to verify to see if her medical regimen      can be changed, improved upon, she has a window now been off      anticoagulation.  She is due to be seen in GI next week.  We will      go ahead and plan for this later this week.  I have discussed this      with her and she is agreeable, planned for pre cath testing today.  2. Atrial fibrillation.  Continue on rate control.  Again Holter      monitors in the past have been adequate.  3. Chronic obstructive pulmonary disease.  Continue oxygen again to      follow up with cath.  4. GI appointment next week.  We will check hemoglobin today.  We      would like to get her back on Coumadin.  Again, I do not have      record of a neurologic event in the past until her hemoglobin GI      should stabilize with hold.  5. Dyslipidemia.  6. Coronary artery disease.  Again, left heart cath as scheduled.  7. Dyslipidemia.  The patient was tried on Zocor has not tolerated      secondary to achiness. We will need to readdress on return visit.      I will set to see the patient back after the testing.     Pricilla Riffle, MD, [facility]  Electronically Signed    PVR/MedQ  DD:  08/02/2008  DT: 08/03/2008  Job #: [id number]

## 2011-04-24 NOTE — Assessment & Plan Note (Signed)
[facility]                            CARDIOLOGY OFFICE NOTE   NAME:Koska, NEVAEH KORTE                     MRN:          [id number]  DATE:10/11/2008                            DOB:          21-Jul-1929    IDENTIFICATION:  Ms. Nourse is a 77 year old woman with a history of  restrictive cardiomyopathy, pulmonary hypertension, atrial fibrillation,  history of gastric ulcers, history of hemorrhoids, hypertension, anemia,  and history of CVA.  She was last seen in cardiology clinic for an  appointment in August.  She has been followed closely and I saw her  earlier last month in the research area.   When I saw her couple weeks ago, her heart rate was on low side and I  told her to pull back on her metoprolol, which she has done.  Since  October 04, 2008, she has only taken 1 metoprolol when she felt her  heart rate going fast.   She has had repeat blood work done and her last BUN and creatinine were  93 and 1.7 and hemoglobin was 8.6.   CURRENT MEDICATIONS:  1. Demadex 60 a.m., 60 p.m.  2. Synthroid 0.075.  3. CPAP, O2.  4. Allopurinol 150.  The patient no longer taking metoprolol.  5. Cozaar 100.  6. Nexium 40 b.i.d.  7. Vitamin C.  8. Rocket study drug.  9. Polysaccharide 300 b.i.d.   PHYSICAL EXAMINATION:  GENERAL:  On exam the patient currently is in no  acute distress in a wheelchair.  VITAL SIGNS:  Blood pressure is 111/41, pulse is 55 and regular, weight  196 which is up 2 pounds from September 27, 2008.  NECK:  No obvious JVD.  LUNGS:  Relatively clear.  CARDIAC:  Regular rate and rhythm.  S1 and S2.  No S3.  ABDOMEN:  Obese.  EXTREMITIES:  Trivial edema.   A 12-lead EKG shows atrial fibrillation with a ventricular rate of 54  beats per minute.  ST-T wave changes with T-wave inversion in the  inferior lateral leads.  Right bundle branch block.   IMPRESSION:  1. Restrictive myopathy.  Volume status does not look too bad today.  We will check a BMET, though and see where BUN and creatinine are      as well as a BMP.  We would continue on her medicines for now.  2. Atrial fibrillation.  Rates are on the low side even without a beta-      blocker.  Note, she had a Holter monitor done, which has not been      scan yet.  I will be in touch with her.  I told her take her pulse      at home and only use of beta-blocker here and there for now.  3. Anemia.  We will check a CBC today.  I am very concerned that if      her hemoglobin trickles down more, we may need to stop the      anticoagulant.  With her BUN increasing with lot of proportion, I      again worry that  there is some GI source of her blood loss.  Note,      she has a continues to take iron supplement.  4. Hypertension, adequate control.   I will set to see the patient back in a couple of months, but I will be  in touch with her regarding her blood work.  Note, also we are  attempting to get a visiting nurse to come to her home to avoid some of  the strips that are becoming very difficult for her and her husband.     Pricilla Riffle, MD, [facility]  Electronically Signed    PVR/MedQ  DD: 10/11/2008  DT: 10/12/2008  Job #: [id number]

## 2011-04-24 NOTE — Assessment & Plan Note (Signed)
[facility]                            CARDIOLOGY OFFICE NOTE   NAME:[name], [name]                     MRN:          [id number]  DATE:09/15/2007                            DOB:          07-22-1929    IDENTIFICATION:  Patient is a 77 year old woman with a history of  diastolic dysfunction, shortness of breath, sleep apnea, atrial  fibrillation, hypertension.  I last saw her back in June.   Since seen she has done fairly well.  Her breathing is better, she is on  oxygen now and is tolerating her CPAP.  Note, she had a couple of flares  of gout and is no longer on allopurinol.   CURRENT MEDICATIONS:  1. Ferrex 150 b.i.d.  2. Metoprolol 50 daily.  3. Cartia 300 daily.  4. Torsemide 20, two in the a.m., one in the p.m.  5. Synthroid 75 mcg.  6. Cozaar 100.  7. Rocket study drug.  8. O2 as directed.  9. CPAP.  10.Vitamin C.   PHYSICAL EXAM:  Patient is in no distress.  The blood pressure is  124/79, pulse is 64, weight 195.  LUNGS:  Relatively clear.  CARDIAC EXAM:  Irregularly irregular S1 S2, no S3, distant heart sounds.  ABDOMEN:  Obese.  EXTREMITIES:  No edema.   IMPRESSION:  1. Dyspnea.  This is actually the best I have seen Ms. Minks.  I      think the continuous positive airway pressure really has helped      with her.  I would continue on her current regimen for now.  2. Atrial fibrillation.  Rates are adequate.  She is on the [employer] drug.  Would continue.  3. Hypertension.  Adequate control.  4. Healthcare maintenance.  Will need to follow up to see when she had      lipids checked.   ADDENDUM:  The patient had only minimal disease by cath in 2005.   Note, the patient had a flair of gout.  I would add allopurinol 150 back  to her regimen.     Pricilla Riffle, MD, [facility]  Electronically Signed    PVR/MedQ  DD: 09/15/2007  DT: 09/16/2007  Job #: [id number]   cc:   Titus Dubin. Alwyn Ren, MD,FACP,FCCP

## 2011-04-24 NOTE — Assessment & Plan Note (Signed)
[facility]                            CARDIOLOGY OFFICE NOTE   NAME:[name], [name]                     MRN:          [id number]  DATE:01/28/2009                            DOB:          May 04, 1929    IDENTIFICATION:  [name] is a 77 year old woman.  I last saw her in  November.  She has a history of cardiomyopathy restrictive, pulmonary  hypertension, atrial fibrillation, gastric ulcers, hemorrhoids,  hypertension, anemia, history of CVA.   Since seen, she actually is more short of breath now, she thinks she is  having more fluid, she feels her neck pulsating.   CURRENT MEDICATIONS:  1. Synthroid 0.075 mcg daily.  2. Vitamin C.  3. Cozaar 100.  4. Allopurinol 750.  5. Nexium 40.  6. CPAP.  7. O2 2 L.  8. Polysaccharide.  9. Demadex 80 a.m., 60 p.m.  10.Simvastatin 20.  11.Tramadol 50 p.r.n.  12.Colchicine p.r.n.  13.Xopenex p.r.n.   PHYSICAL EXAMINATION:  GENERAL:  On exam, the patient is in no distress  at rest.  She is in a wheelchair wearing oxygen.  VITAL SIGNS:  Blood pressure is 115/50, pulse is 68, weight not taken.  NECK:  JVP is increased.  LUNGS:  Mild wheeze.  Rhonchi, crackles at left base.  CARDIAC:  Regular rate and rhythm.  S1 and S2.  No S3.  ABDOMEN:  Obese, benign.  EXTREMITIES:  Trace edema.   IMPRESSION:  1. Restrictive cardiomyopathy.  On exam, she does appear to be volume      increase.  I told her I need to check labs today to figure out what      to do with her diuretic.  I will be in touch with her.  2. History of anemia, GI bleeds.  Hemoglobin yesterday was 8.9.  We      would continue on anticoagulant with her atrial      fibrillation/cerebrovascular accident history.  3. Atrial fibrillation.  Continue on medical therapy with rate control      and Coumadin.   I will set followup for a few weeks to assess the change in response  with diuretic.     Pricilla Riffle, MD, [facility]  Electronically  Signed    PVR/MedQ  DD: 01/28/2009  DT: 01/29/2009  Job #: [id number]

## 2011-04-24 NOTE — Assessment & Plan Note (Signed)
[facility]                          CHRONIC HEART FAILURE NOTE   NAME:[name], [name]                     MRN:          [id number]  DATE:09/01/2008                            DOB:          [birth year]    PRIMARY CARDIOLOGIST:  Pricilla Riffle, MD, [facility]   [name] returns today for further followup of her congestive heart  failure, which is secondary to diastolic dysfunction.  She has done  quite well since I last saw her in early September.  At that visit, it  was noted that [name] was taking higher doses of her diuretics  than recommended, I made adjustments in her Demadex and had blood work  checked on her, which showed a BUN and creatinine of 58 and 1.5 and her  BNP was 178.  I decreased her Demadex to 20 mg 3 tablets twice a day.  Since that visit, [name] has also followed up with Dr. Tenny Craw and  she did not make changes in the patient's medications.  The patient was  stable at that time.  She did repeat blood work at that time.  Potassium  was 4.0, BUN and creatinine 52 and 1.4, H&H 10.7 and 31.8.  Her BNP was  at 250.  [name] also has a history of GI bleed.  Her hemoglobin  appears to have stabilized.  She states she is feeling much stronger,  still has periods of unsteady gait and feels a little woozy she states,  but for the most part has improved tremendously.  Continues to use her  O2 nasal cannula continuously.  Denies any symptoms suggestive of volume  overload.  States she is sleeping much better.   PAST MEDICAL HISTORY:  1. Congestive heart failure secondary restrictive cardiomyopathy with      a normal EF status post recent cardiac catheterization showing      cardiac index of 2.8.  The patient had nonobstructive disease.  2. Chronic atrial fib requiring Coumadin therapy.  3. Recent GI bleed secondary to hemorrhoids status post transfusion.  4. COPD with chronic O2 use.  5. History of CVA.  6. Obstructive sleep  apnea with partial compliance to CPAP.   REVIEW OF SYSTEMS:  As stated above.   CURRENT MEDICATIONS:  1. Demadex 60 mg b.i.d.  2. Synthroid 0.075 mg daily.  3. CPAP.  4. O2.  5. Allopurinol 150.  6. Metoprolol 50 daily.  7. Cozaar 100 daily.  8. Nexium 40 b.i.d.  9. Vitamin C daily.  10.Rocket study drug, Farix 150 two tablets daily.  11.Warfarin 5 mg daily.  12.Relaxin 1 tablet daily.  13.Omeprazole 20 mg daily.   PHYSICAL EXAMINATION:  VITAL SIGNS:  Weight is 192 pounds, the patient  states she consistently weighs 185-186 at home, blood pressure 123/58  with heart rate of 77.  GENERAL:  [name] is in no acute distress.  She is her usual  pleasant self.  LUNGS:  Clear to auscultation.  CARDIOVASCULAR:  Reveals S1 and S2, currently regular rate and rhythm.  ABDOMEN:  Soft, nontender.  Positive bowel sounds.  EXTREMITIES:  Lower extremities without clubbing, cyanosis, or edema.   IMPRESSION:  Congestive heart failure secondary to restrictive  cardiomyopathy with a normal ejection fraction.  The patient without  significant volume overload at this time.  We will continue current  medications.  We plan on seeing Primrose back in 2 months, sooner if she  has any problems.  She knows to call me if any significant changes in  her weight or symptoms.      Dorian Pod, ACNP  Electronically Signed      Rollene Rotunda, MD, [facility]  Electronically Signed   MB/MedQ  DD: 09/01/2008  DT: 09/02/2008  Job #: [id number]

## 2011-04-24 NOTE — Assessment & Plan Note (Signed)
[facility]                            CARDIOLOGY OFFICE NOTE   NAME:[name], [name]                     MRN:          [id number]  DATE:11/08/2008                            DOB:          Apr 21, 1929    IDENTIFICATION:  [name] is a 77 year old woman with a history of  restrictive cardiomyopathy, pulmonary hypertension, atrial fibrillation,  history of gastric ulcers, history of hemorrhoids, hypertension, anemia,  and history of CVA.  I last saw her in early November.   In the interval, she says she has been bothered by hacking cough, which  is nonproductive.  She denies fevers.  She says when she wears her face  mask at night, it quiets down.  This is new since I saw her.  She denies  any PND-like symptoms.  Otherwise, her breathing is about the same.  When she does drinks, she said she gives out.   CURRENT MEDICINES:  1. Demadex 60 b.i.d.  2. Synthroid 0.075.  3. CPAP at night.  4. O2.  5. Allopurinol 150.  6. Cozaar 100.  7. Nexium 40.  8. Vitamin C.  9. Rocket study drug.   PHYSICAL EXAMINATION:  GENERAL:  The patient is in no distress at rest.  VITAL SIGNS:  She is on oxygen.  Blood pressure 140/80, pulse is 56, and  weight 195, which is down 1 pound from previous years.  LUNGS:  Relatively clear.  CARDIAC:  Regular rate and rhythm, S1 and S2.  No S3.  ABDOMEN:  Obese and benign.  EXTREMITIES:  Trace edema.   IMPRESSION:  1. Restrictive cardiomyopathy.  I am not sure if her cough symptoms      are related to increased volume.  I would check labs today.  Again,      her volume status is difficult to tell.  She has not noted a marked      change in her breathing.  I will also check a CBC today as her last      hemoglobin was in the 8s (checked by Dr. Claudette Head).  We would      keep her on same regimen.  I will be in touch with her.  2. Atrial fibrillation.  Rates remain on the low side even off beta-      blocker.  3.  Anemia.  Again check CBC today.  4. Hypertension, adequate.   I will set to see the patient back in about 1 months' time to try to  coordinate follow up with a research study visit.     Pricilla Riffle, MD, [facility]  Electronically Signed    PVR/MedQ  DD: 11/08/2008  DT: 11/09/2008  Job #: [id number]

## 2011-04-24 NOTE — Discharge Summary (Signed)
NAME:  [name], [name] NO.:  [id number]   MEDICAL RECORD NO.:  [id number]          PATIENT TYPE:  INP   LOCATION:  5152                         FACILITY:  [facility]   PHYSICIAN:  Valerie A. Felicity Coyer, MDDATE OF BIRTH:  May 15, 1929   DATE OF ADMISSION:  04/09/2008  DATE OF DISCHARGE:  04/14/2008                               DISCHARGE SUMMARY   DISCHARGE DIAGNOSES:  1. Cellulitis/umbilical abscess, status post bedside incision and      drainage during this admission.  2. Acute blood-loss anemia secondary to umbilical bleed following      incision and drainage, status post two units of packed red blood      cells, now hemodynamically stable.  3. History of peptic ulcer disease, stable on PPI without clinical      signs of GI blood loss.  4. Chronic diastolic heart failure, stable on home Toprol and Demadex.  5. Hypothyroid.  6. History of obstructive sleep apnea.  7. Cerebrovascular accident.  8. Hypertension.   HISTORY OF PRESENT ILLNESS:  [name] is a 77 year old female who  was admitted on Apr 09, 2008, secondary to complaint of swollen stomach  with drainage at her umbilicus, which was accompanied by redness and  swelling for 3-4 days duration.  She was admitted for further evaluation  and treatment.   PAST MEDICAL HISTORY:  1. Atrial fibrillation, status post cardioversion in 1996.  2. CVA in 1999.  3. COPD.  4. Obstructive sleep apnea, uses nightly CPAP.  5. Anemia.  6. Gout.  7. History of CVA.  8. History of peptic ulcer with acute hemorrhage.  9. Hypothyroidism.  10.Hypertension.   COURSE OF HOSPITALIZATION:  1. Umbilical abscess.  The patient was admitted and was placed on IV      Avelox.  Surgical consult was requested and the patient underwent a      bedside I&D.  At this time, she has shown great clinical      improvement with decreased erythema and drainage of the umbilical      wound.  Culture of the wound performed in the office showed     multiple morphotypes without anyone dominant species.  She is      responded clinically to Avelox.  She will continue p.o. and to      complete a 10-day total course.  In addition, we will ask a home      health RN to assist the patient in the home with wet-to-dry      dressing changes until healed and then to place a dry dressing      change.  2. Chronic atrial fibrillation.  The patient has a history of atrial      fibrillation and is currently being anticoagulated with the ROCKET      study drug.  Her anticoagulation was held during this admission as      the patient developed acute blood-loss anemia from the umbilicus      and the patient also received FFP.  She has remained      hemodynamically stable after 2 units of packed red blood  cells.      Surgery recommended holding her anticoagulation for 7 days total.      She is scheduled for a colonoscopy on Monday, Apr 19, 2008.  We      will continue to hold the anticoagulation until that time.  Plan to      resume.  She is to follow up with the study group for further      instructions.   MEDICATIONS:  At time of discharge:  1. Serax 150 two times p.o. b.i.d. with food.  2. Toprol-XL 100 mg p.o. daily.  3. Torsemide 20 mg tabs 3 tablets p.o. q.a.m. and 2 tablets p.o.      q.p.m.  4. Nexium 40 mg p.o. b.i.d.  5. Synthroid 0.075 mg p.o. daily.  6. Cozaar 100 mg p.o. daily.  7. Colchicine 0.6 mg p.o. b.i.d.  8. Allopurinol 300 mg one half tab p.o. daily.  9. Oxygen to 2-1/2 liters nasal cannula continuously.  10.Xopenex MDI 2 puffs q.6 h. as needed.  11.Ultram 50 mg 1/2-1 tablet p.o. q.8 h. as needed.  12.Avelox 400 mg p.o. daily for 5 days.  13.ROCKET study drug to be held until colonoscopy completed Apr 19, 2008, and then resume as before.   DISPOSITION:  The patient will be discharged to home with home health  RN.  She is to follow up with Dr. Alwyn Ren next week as well as Dr.  Zachery Dakins in 1 week.  She is instructed to  call [facility] for  followup in regards to study drug next week and she is currently  scheduled for an outpatient colonoscopy on Apr 19, 2008.      Sandford Craze, NP      Raenette Rover. Felicity Coyer, MD  Electronically Signed    MO/MEDQ  D:  04/14/2008  T:  04/15/2008  Job:  [id number]   cc:   Anselm Pancoast. Zachery Dakins, M.D.  Titus Dubin. Alwyn Ren, MD,FACP,FCCP  Venita Lick. Russella Dar, MD, [facility]

## 2011-04-24 NOTE — Assessment & Plan Note (Signed)
[facility]                            CARDIOLOGY OFFICE NOTE   NAME:Youtz, DESHAUN SCHOU                     MRN:          [id number]  DATE:07/15/2008                            DOB:          [birth year]    IDENTIFICATION:  The patient is a 77 year old woman who I follow in  clinic.  She has a history of moderate CAD (by cath in 2005), elevated  filling pressures felt secondary to diastolic dysfunction, dyspnea,  atrial fibrillation, and hypertension.   The patient was scheduled actually to come into the study area as part  of the Rocket study.  She told the nurses there that she has had over  the past week, several episodes of bright red blood in the toilet.  She  describes them as a small amount.  She thinks it is due to her  hemorrhoids, but this is unchanged for her.   Her breathing is relatively unchanged.  She still gives out with  activity.  Lower extremity edema has improved some.   Current medicines include,  1. Ferrex 150 two tablets twice a day (note, she is very nauseated      after the evening dose).  2. Rocket study drug.  3. Vitamin C.  4. Nexium p.r.n.  5. Cozaar 100.  6. Furosemide 60 a.m. and 40 p.m.  7. Metoprolol 50 daily XL.  8. Allopurinol 150.  9. Two liters O2.  10.CPAP.  11.Synthroid 0.075.  12.Omeprazole 20.   PHYSICAL EXAMINATION:  VITAL SIGNS:  Not available.  LUNGS:  Clear.  CARDIAC:  Regular rate and rhythm, S1, S2.  No S3.  EXTREMITIES:  Trivial edema.   IMPRESSION:  We will go ahead and check some blood work today, BMET,  BMP, CBC, AST, CK (note, the patient was tried on Zocor and did not  tolerate because of achiness stopped).   I will be in touch with Dr. Russella Dar who has seen the patient in the past.  We told her to hold her study drug today and may need a  sigmoidoscopy/colonoscopy to evaluate the bleeding.  I will be in touch  with her or he will be in touch with her with plans.   In regards her cardiac  history and review of things at some point, she  should have a right heart catheterization to reevaluate her filling  pressures indeed on echocardiogram, atria are significantly enlarged.  Again, she needs to get through this.  Now with the GI issues first.     Pricilla Riffle, MD, [facility]  Electronically Signed    PVR/MedQ  DD: 07/16/2008  DT: 07/16/2008  Job #: [id number]   cc:   Venita Lick. Russella Dar, MD, [facility]

## 2011-04-24 NOTE — Assessment & Plan Note (Signed)
[facility]                            CARDIOLOGY OFFICE NOTE   NAME:[name]                     MRN:          [id number]  DATE:10/09/2007                            DOB:          1929-04-07    IDENTIFICATION:  Patient is a 77 year old woman with a history of  diastolic dysfunction, shortness of breath, atrial fibrillation,  hypertension.  I saw her back in October.   When I saw her last I set her up with a Holter Monitor.  She wore this  and her heart rates ranged from 42 to 72 bpm, an average of 52, the  longest pause was 3.3 seconds.  I recommended that she stop the [place]  and I would see her back in 2 weeks, concerned that she may be going too  fast and would need to boost the beta-blocker.   In the interval, she has done okay.  Her breathing is about the same.  She actually, when I saw her last time, was breathing very well.  She  has noted that her heart rate is beating a little faster.   CURRENT MEDICATIONS:  1. Ferrex 300 daily.  2. Metoprolol 50 daily.  3. Torsemide 40 q.a.m., 20 q.p.m.  4. Synthroid 75 mcg.  5. Cozaar 100.  6. Study drug (Rocket).  7. O2 as directed.  8. Vitamin C.  9. CPAP.  10.Allopurinol 150 and tramadol 25 q.8.   PHYSICAL EXAM:  On exam, the patient is in no distress.  Blood pressure  is 144/70, pulse is 73 and irregular.  Weight 194, stable.  LUNGS:  Clear.  CARDIAC EXAM:  Regularly irregular, S1 S2.  No S3, no murmurs.  ABDOMEN:  Benign.  EXTREMITIES:  Trace edema.   IMPRESSION:  1. Atrial fibrillation.  Rates were a little too controlled.  I      stopped her Cardizem.  Today I am going to increase her Toprol to      metoprolol XL 100 daily.  I will follow up in March, sooner if she      has problems.  2. Dyspnea.  Again, has been better.  3. Hypertension.  Should improve with a bigger dose of Toprol.  4. History of peptic ulcer disease, history of gastrointestinal bleed.  5. History of sleep  apnea on continuous positive airway pressure.  6. History of gout, now on allopurinol.   ADDENDUM:  Patient given flu shot today.     Pricilla Riffle, MD, [facility]  Electronically Signed    PVR/MedQ  DD: 10/09/2007  DT: 10/10/2007  Job #: [id number]

## 2011-04-24 NOTE — Assessment & Plan Note (Signed)
[facility]                            CARDIOLOGY OFFICE NOTE   NAME:[name], [name]                     MRN:          [id number]  DATE:12/31/2008                            DOB:          [birth year]    IDENTIFICATION:  The patient is a 77 year old woman with a history of  restrictive cardiomyopathy, pulmonary hypertension, atrial fibrillation,  gastric ulcers, hemorrhoids, hypertension, anemia, CVA.  I last saw her  back in November.  She comes in today for return visit plus she was seen  by the research nurses per protocol.   Since seen, she has been doing okay.  She says there is less pressure in  her chest and neck.  She seems to be breathing okay.  She is on the  wheelchair when she has to go far.  Sleeping okay.  Denies PND.  Appetite is good.   CURRENT MEDICINES:  1. Demadex 80 a.m., 60 p.m.  2. Synthroid 0.075 daily.  3. CPAP at night.  4. O2 oxygen.  5. Allopurinol 150 daily.  6. Cozaar 100.  7. Nexium 40 b.i.d.  8. Vitamin C b.i.d.  9. Rocket study drug.  10.Polysaccharide one 300 b.i.d.   PHYSICAL EXAMINATION:  GENERAL:  On exam, the patient is in no distress  at rest.  VITAL SIGNS:  Blood pressure is 125/57, pulse is 53 and irregular,  weight not taken.  NECK:  JVP does not appear to be increased.  Her lungs are relatively  clear.  Occasional crackles at the bases.  CARDIAC:  Irregular rate and rhythm, S1 and S2, no S3.  ABDOMEN:  Benign.  EXTREMITIES:  Trace to 1+ edema.   IMPRESSION:  1. Restrictive cardiomyopathy.  Her volume status overall is good as      it has been.  We will check labs today.  Continue regimen.  2. Atrial fibrillation.  Rate is adequate off beta-blocker.  3. Anemia.  CBC.  4. Hypertension, good control.  5. Gastrointestinal bleed.  Again, we will follow CBC.  The patient      reports no active bleeding.   I will try to set to see the patient back in 4 months dated on the day  when she comes in for  her research visits.     Pricilla Riffle, MD, [facility]  Electronically Signed    PVR/MedQ  DD: 01/02/2009  DT: 01/03/2009  Job #: [id number]

## 2011-04-24 NOTE — Assessment & Plan Note (Signed)
[facility]                            CARDIOLOGY OFFICE NOTE   NAME:Coutant, JAYDI BRAY                     MRN:          [id number]  DATE:02/06/2008                            DOB:          03-26-1929    IDENTIFICATION:  Ms. [name] is a 77 year old woman with diastolic  dysfunction, dyspnea, atrial fibrillation and hypertension.  I last saw  her actually back in January.  When she was seen her blood work showed a  BMP of 470.  I went ahead and increased her Lasix (I had thought it was  torsemide) to 3 tablets in the morning and 2 tablets in the evening.  She has since gotten better.  She had an upper respiratory infection at  the time as well.  She thinks her breathing is back to baseline.   CURRENT MEDICATIONS:  1. Include ferric 300 b.i.d.  2. Study drug (Rocket).  3. Vitamin C daily.  4. Nexium 40 b.i.d.  5. Cozaar 100.  6. Lasix 60 mg a.m., 40 mg p.m.  7. Metoprolol XL 50 mg daily.  8. Allopurinol 150 daily.  9. O2 2 liter CPAP.  10.Synthroid 0.075.   PHYSICAL EXAMINATION:  GENERAL:  The patient is in no distress.  VITAL SIGNS:  Blood pressure is 132/62, pulse is 93, weight 200 which  is, the last weight was not recorded.  LUNGS:  Her lungs are relatively clear.  CARDIOVASCULAR:  Cardiac exam irregularly irregular.  S1-S2.  No  murmurs.  ABDOMEN:  Benign.  EXTREMITIES:  Show trace edema.   IMPRESSION:  1. Dyspnea, better.  I would like to get a BMET and a BMP with her      again.  Her exam is always difficult.  Continue on the medicines      for now.  2. Atrial fibrillation.  Continue on the Rocket study.  3. History of sleep apnea.  Continue CPAP.  4. History of chronic obstructive pulmonary disease, on oxygen.   I will set to see the patient back later in the summer otherwise.  Sooner if problems develop.  Again, continue as she is doing.     Pricilla Riffle, MD, [facility]  Electronically Signed    PVR/MedQ  DD: 02/06/2008  DT:  02/08/2008  Job #: [id number]   cc:   Titus Dubin. Alwyn Ren, MD,FACP,FCCP

## 2011-04-24 NOTE — Assessment & Plan Note (Signed)
[facility]                            CARDIOLOGY OFFICE NOTE   NAME:[name], [name] ROSELLO                     MRN:          [id number]  DATE:09/27/2008                            DOB:          05-Aug-1929    IDENTIFICATION:  [name] is a 77 year old woman with hypertension,  CAD (moderate), and restrictive cardiomyopathy.  She was last seen in  Cardiology Clinic by [name] on September 01, 2008.  Medicines  were not changed.  Plan for 72-month followup.   Since she has been seen by Claudette Head, she actually had blood work  done after seeing some red stool (she did not think it was blood).  Her  hemoglobin on September 23, 2008 was 9.6.  Plan was for followup in 1-  month time.   On talking to her, her breathing is better that it has been.  She states  she is very sensitive to fluid, but it has been okay.  Ankles are  better.   CURRENT MEDICATIONS INCLUDE:  1. Demadex 60 a.m., 60 p.m.  2. Synthroid 0.075.  3. CPAP.  4. Oxygen.  5. Allopurinol 150.  6. Metoprolol 50.  7. Cozaar 100.  8. Nexium.  9. Omeprazole b.i.d.  10.Vitamin C.  11.Rocket study drug.  12.Ferrex 150.   PHYSICAL EXAMINATION:  GENERAL:  The patient is in no distress.  VITAL SIGNS:  Blood pressure is 98/42, pulse is 73 and regular, weight  194 (at home, she weighs 185-189).  LUNGS:  Relatively clear.  CARDIOVASCULAR:  Regular rate and rhythm.  S1 and S2 with no S3.  No  significant murmurs.  ABDOMEN:  Obese.  EXTREMITIES:  Trace edema.   IMPRESSION:  1. Restrictive cardiomyopathy.  Plan for medical therapy.  Volume      status appears fair today.  We would keep on same regimen.  If her      weight does go up at home and stay up, she should take additional      Demadex.  I will set to see her otherwise in 6 weeks.  2. Gastrointestinal.  The patient has had gastrointestinal bleed in      the past.  Note, her hemoglobin has been a little higher.  Claudette Head has seen and has planned to follow up in a few weeks.  3. Atrial fibrillation.  Continue on rate control and Coumadin.  4. Chronic obstructive pulmonary disease.  Continue on oxygen.  5. Coronary artery disease, on medical therapy.  She did not tolerate      Zocor in the past.  We will need to readdress.     Pricilla Riffle, MD, [facility]  Electronically Signed    PVR/MedQ  DD: 09/27/2008  DT: 09/28/2008  Job #: [id number]

## 2011-04-24 NOTE — Cardiovascular Report (Signed)
NAME:  [name], [name] NO.:  [id number]   MEDICAL RECORD NO.:  [id number]          PATIENT TYPE:  OIB   LOCATION:  1962                         FACILITY:  [facility]   PHYSICIAN:  Bevelyn Buckles. Bensimhon, MDDATE OF BIRTH:  01-30-1929   DATE OF PROCEDURE:  DATE OF DISCHARGE:  08/06/2008                            CARDIAC CATHETERIZATION   REFERRING CARDIOLOGIST:  Pricilla Riffle, MD, [facility].   IDENTIFICATION:  [name] is a very pleasant 77 year old woman with  a history of chronic dyspnea, diastolic dysfunction, moderate  nonobstructive coronary artery disease via cardiac catheterization in  2005, and atrial fibrillation.   She has recently seen Dr. Tenny Craw and noted that she has had chronic  shortness of breath with maybe some progression.  She has been off her  ROCKET study drug for atrial fibrillation due to a recent GI bleed and  this has stabilized.  We will bring her in for right and left heart  catheterization to see if we can further evaluate her dyspnea.   PROCEDURES PERFORMED:  1. Right heart cath.  2. Left heart cath.  3. Selective coronary angiography.  4. Selective renal angiography to evaluate her renal arteries given      her hypertension.   DESCRIPTION OF PROCEDURE:  The risk and indication of the  catheterization were explained, consent was signed, and placed on the  chart.  A 7-French venous sheath was placed in the right femoral vein  and standard right heart cath was performed with a Swan-Ganz catheter.   A 4 French arterial sheath was placed in the right femoral artery using  a modified Seldinger technique.  Standard catheters were used including  JL4, JR4 and angled pigtail.  All catheter exchanges made over wire with  no apparent complications.   Fluoroscopy of the right atrium, was massively dilated.   HEMODYNAMIC DATA:  Central aortic pressure 179/77, mean 122.  LV  pressure 177/30 with an EDP of 32.  Right atrium had a mean of 24, RV  pressure was 68/55 and RVEDP of 28.  PA pressure was 70/34 with a mean  of 51.  Pulmonary capillary wedge pressure ranged 32-34.  Fick cardiac  index was 5.1 liters per minute.  Cardiac output was 5.1 liters per  minute.  Cardiac index was 2.8 liters per minute per meter squared.  Pulmonary vascular resistance was 3.7 Woods units.  There is no LV/RV  interaction on simultaneous pressures.   Left main was normal.   LAD was a fairly small vessel.  It gave off several septal perforators  before tapering off in the mid-to-distal section.  It gave off a large  branching diagonal, which filled most of the LAD territory.  The  diagonal had a 40% ostial lesion.  LAD was okay.   Left circumflex gave off 2 marginal branches.  There was a 30-40%  tubular lesion proximally in the left circumflex and in the proximal  portion of the second marginal, there was a 70-80% focal lesion.   Right coronary artery which is a large dominant vessel with an anterior  takeoff, it gave off a  large acute marginal branch and a large PDA and  just minor luminal irregularities in the midsection.   The left ventriculogram was not performed in order to limit contrast  exposure.   Selective renal angiography done to evaluate the renal artery stenosis  in the setting of her marked hypertension.  Her left renal artery was  widely patent.  We actually never visualized the right renal artery.  We  abandoned the procedure after just a few attempts in order to limit  contrast exposure.   ASSESSMENT:  1. Significant 1-vessel coronary artery disease in the OM-2 as      described above.  2. Markedly elevated biventricular pressure suggestive of restrictive      cardiomyopathy.  3. Moderate pulmonary hypertension with both pulmonary arterial and      pulmonary venous components.   PLAN/DISCUSSION:  I doubt her coronary artery disease is causing her  dyspnea.  I suspect that it is truly her significant elevation in   filling pressures, which look like a restrictive cardiomyopathy.  We  will go ahead and try and push her diuretics.  I will discuss with Dr.  Tenny Craw and consider possibly switching over to Demadex 40 in the morning  and 20 at night and see how she does.      Bevelyn Buckles. Bensimhon, MD  Electronically Signed     DRB/MEDQ  D:  08/06/2008  T:  08/07/2008  Job:  [id number]

## 2011-04-24 NOTE — Assessment & Plan Note (Signed)
[facility]                         GASTROENTEROLOGY OFFICE NOTE   NAME:[name], [name]                     MRN:          [id number]  DATE:03/22/2008                            DOB:          06-Sep-1929    REFERRING PHYSICIAN:  Titus Dubin. Hopper, MD,FACP,FCCP   REASON FOR CONSULTATION:  Personal history of adenomatous colon polyps  on Coumadin anticoagulation.   HISTORY OF PRESENT ILLNESS:  This is a 77 year old white female with  multiple medical problems.  She has a history of adenomatous colon  polyps initially diagnosed in April of 2005.  The largest polyp was 7  mm.  Colonoscopy also revealed internal hemorrhoids.  She relates  intermittent loose stools, but no change in her chronic bowel habits.  No change in stool caliber, melena, or hematochezia.  She has a history  of GERD, hiatal hernia, and a prior history of iron deficiency anemia.  Hemoglobin from January 2009 was 9.3 with an elevated MCV at 101.7.  She  has obstructive sleep apnea and is maintained on CPAP.  In addition, she  is on home oxygen for COPD.  These problems have been stable.  She is  involved in the [employer] under [facility] and she is  maintained on Coumadin anticoagulation and another anticoagulant for  atrial fibrillation.  She relates a prior history of TIAs about 5-6  years ago and she has not had recurrent problems.  She notes no  dysphagia, odynophagia, nausea, vomiting, abdominal pain, or weight  loss.   PAST MEDICAL HISTORY:  Obstructive sleep apnea on CPAP  COPD on home oxygen  atrial fibrillation on Coumadin anticoagulation and [employer] Study  hypertension  history of peptic ulcer disease complicated by bleeding  history of adenomatous colon polyps  GERD with hiatal hernia  internal hemorrhoids  status post TIAs  arthritis  anxiety  headaches   PAST SURGICAL HISTORY:  Status post hysterectomy  status post appendectomy  status post removal of  birth mark on lower extremity.   CURRENT MEDICATIONS:  Listed on the chart, updated and reviewed.   ALLERGIES:  No known drug allergies.   SOCIAL HISTORY:   REVIEW OF SYSTEMS:  Per the handwritten form.   PHYSICAL EXAMINATION:  GENERAL:  A well-developed, well-nourished,  chronically ill-appearing female on oxygen by nasal cannula.  She has  central obesity.  VITAL SIGNS:  Height 5 feet, weight 204.2 pounds, blood pressure 126/58,  pulse 68.  HEENT:  Anicteric sclerae.  Oropharynx clear.  NECK:  Without thyromegaly or adenopathy appreciated.  CHEST:  Clear to auscultation bilaterally.  HEART:  Regular rate and rhythm without murmurs appreciated.  ABDOMEN:  Protuberant, soft, nontender, and nondistended.  Normal active  bowel sounds.  No palpable organomegaly, masses, or hernias.  RECTAL:  Deferred to colonoscopy.  EXTREMITIES:  Lower extremity chronic venous stasis changes with 2+  pretibial edema.  NEUROLOGY:  Alert and oriented x3.  Grossly nonfocal.   ASSESSMENT:  1. Personal history of adenomatous colon polyps with multiple      comorbidities including anticoagulation for atrial fibrillation,      obstructive sleep apnea  on CPAP and continuous oxygen therapy for      chronic obstructive pulmonary disease.  She is also in the [employer].  The risks, benefits, and alternatives to colonoscopy with      possible biopsy and possible polypectomy performed off Coumadin      anticoagulation and off any other anticoagulant in the [facility]      were discussed with the patient and she consents to proceed.  We      will coordinate care with Pricilla Riffle, MD, [facility] and Shelby Dubin,      PharmD, BCPS, CPP regarding anticoagulation management and the      study.  The patient is instructed to bring her home oxygen and CPAP      for the procedure.  She is advised to hold iron for five days prior      to the procedure to allow more adequate bowel preparation.  Given      her  multiple comorbidities, we will not plan for additional      surveillance colonoscopies unless she has large colon polyps noted      on this examination.  2. Macrocytic anemia.  Obtain a B12 and folate today.  Further follow-      up with Dr. Alwyn Ren.  3. Gastroesophageal reflux disease and hiatal hernia.  Maintain      standard antireflux measures and omeprazole 20 mg p.o. q.a.m.     Venita Lick. Russella Dar, MD, [facility]  Electronically Signed    MTS/MedQ  DD: 03/22/2008  DT: 03/22/2008  Job #: [id number]   cc:   Titus Dubin. Alwyn Ren, MD,FACP,FCCP  Pricilla Riffle, MD, [facility]

## 2011-04-24 NOTE — Assessment & Plan Note (Signed)
[facility]                            CARDIOLOGY OFFICE NOTE   NAME:Barefoot, BRAXTYN DORFF                     MRN:          [id number]  DATE:04/23/2008                            DOB:          1928-12-22    IDENTIFICATION:  Ms. Hollan is a 77 year old woman with multiple  medical problems.  She has a history of moderate CAD by cath back in  2005, elevated filling pressures felt secondary to diastolic  dysfunction, dyspnea, atrial fibrillation, hypertension, and morbid  obesity.  I last saw her in clinic back in February.   In the interval, she was actually admitted to [facility] with an  umbilical abscess that was treated with IV antibiotics and requiring  surgical drainage. She became anemic with bleeding and was transfused.  The study drug was held. Note the patient also just had a colonoscopy  done.   She comes in for continued care today. She denies chest pain.  She said  coming in to the clinic was very difficult because of shortness of  breath.   CURRENT MEDICINES:  1. Ferrex 150, 2 b.i.d.  2. Rocket study drug.  3. Vitamin C.  4. Nexium p.r.n.  5. Cozaar 100.  6. Lasix 60 a.m., 40 p.m.  7. Metoprolol 50 mg daily.  8. Allopurinol 150.  9. O2, 2 liters.  10.CPAP at night.  11.Synthroid 0.075.   PHYSICAL EXAM:  The patient is in no distress at rest with walking.  Her  O2 sats dropped down to 91%.  She was very dyspneic. Blood pressure  138/59, pulse is 71 and regular, weight 196 down 4 pounds from February.  LUNGS:  Relatively clear.  CARDIAC:  Very distant heart sounds, S1, S2. No definite S3, no  significant murmurs.  ABDOMEN:  Benign, obese.  EXTREMITIES:  Trivial edema.   12-lead EKG atrial fibrillation, 73 beats per minute.  Right bundle  branch block.  T-wave inversion inferolaterally.  No change.   IMPRESSION:  1. Dyspnea.  Ms. Vaux has been very difficult, her volume status      is always marginal. She did have  elevated pressures on cath and I      wonder if we are really underestimating the amount of pressure      overload.  I will check labs today, a BNP, a BMET, a CBC and a      thyroid.  I would continue her on the oxygen. We will set her up      for an echocardiogram.  I am leaning based on the above for a      repeat right heart catheterization to define.  2. Coronary artery disease, moderate again. Note, she currently is not      on a statin. Will check non-fasting today as well. On cath, she had      30-50% lesions back in 2005, need to initiate.  3. Hypertension, adequate control.  4. Gastrointestinal. Will be in touch with Claudette Head.  5. Atrial fibrillation on rate control. Last Holter monitor in October      showed a pause of 3.3  seconds.  I went ahead and stopped the Cartia      at that time.   I will be in touch with the patient when I have seen the test results.     Pricilla Riffle, MD, [facility]  Electronically Signed    PVR/MedQ  DD: 04/23/2008  DT: 04/23/2008  Job #: [id number]   cc:   Titus Dubin. Alwyn Ren, MD,FACP,FCCP

## 2011-04-24 NOTE — Assessment & Plan Note (Signed)
HEALTHCARE                            CARDIOLOGY OFFICE NOTE   NAME:Cihlar, NYELLE WOLFSON                     MRN:          [id number]  DATE:08/23/2008                            DOB:          1929/06/19    IDENTIFICATION:  Ms. Terhaar is a patient who I followed in clinic, 77-  year-old.  I last saw her back on August 02, 2008.  She has a history of  pulmonary hypertension, atrial fibrillation, diastolic dysfunction,  hypertension, and GI bleeding.   After I saw her, I contacted her and set her up for right and left heart  cath with Jesusita Oka Bensimhon to reconfirm her pressures, evaluate for any  coronary artery disease.  Indeed she had this done on August 06, 2008.  Aortic pressure at the time was 179/77, right atrial pressure was 24, RV  was 68/55, PA pressure was 70/34, PCWP was 32-34.  Cardiac index was  2.8.  There was no interaction of the LV/RV pressures.  LAD had a 40%  lesion in the diagonal.  Circumflex had 30-40% proximal lesion and  second marginal had a 70-80% focal lesion.  RCA was large and dominant,  normal.   Since seen, the patient has followed up with Dorian Pod in clinic  and she was switched to [facility].  She is now taking 3 tablets in the  morning, 3 tablets in the afternoon.  She says her breathing is better.  She is also trying to watch what she eats.   CURRENT MEDICINES:  1. Ferrex 300 b.i.d.  2. Rocket study drugs.  3. Vitamin C.  4. Nexium 40 b.i.d.  5. Cozaar 100.  6. Metoprolol XL 50 mg daily.  7. Allopurinol 150.  8. O2 2 L.  9. CPAP.  10.Synthroid 0.075.  11.Demadex 60 a.m., 60 p.m.   PHYSICAL EXAMINATION:  GENERAL:  The patient is in no acute distress at  rest.  VITAL SIGNS:  Blood pressure 126/65, pulse is 67, and weight 192 down 1  pound from previous.  LUNGS:  Clear without rales or wheezes.  CARDIAC:  Regular rate and rhythm.  S1 and S2.  No S3.  No murmurs.  ABDOMEN:  Benign.  EXTREMITIES:  No edema  today.   IMPRESSION:  1. Restrictive cardiomyopathy.  For now, I think trying to optimize      her volume.  Continue on current regimen.  We will check labs      today.  She is feeling better, continues on oxygen.  2. Minimal coronary artery disease.  With her gastrointestinal      history, I would not add anything beyond the Rocket study drug.  3. Health care maintenance.  We will need to review her lipid status      and get back with her.  She is not on a statin at present.   I will be in touch with the patient regarding follow up most likely in  the next 4-6 weeks.  I will be in touch with her also when I see the  blood work.     Pricilla Riffle,  MD, [facility]  Electronically Signed    PVR/MedQ  DD: 08/23/2008  DT: 08/24/2008  Job #: [id number]

## 2011-04-27 NOTE — Cardiovascular Report (Signed)
NAME:  [name], [name]                        ACCOUNT NO.:  [id number]   MEDICAL RECORD NO.:  [id number]                   PATIENT TYPE:  INP   LOCATION:  4738                                 FACILITY:  [facility]   PHYSICIAN:  Carole Binning, M.D. [facility]         DATE OF BIRTH:  06-30-1929   DATE OF PROCEDURE:  03/27/2004  DATE OF DISCHARGE:                              CARDIAC CATHETERIZATION   PROCEDURE PERFORMED:  Right and left heart catheterization with coronary  angiography.   INDICATION:  Ms. Barsanti is a 77 year old woman with history of congestive  heart failure and atrial fibrillation.  She was admitted to the hospital  with progressive shortness of breath and chest pain.  She initially had a  low hemoglobin and iron deficiency.  She has been transfused and evaluated  by the gastroenterology service.  Prior to proceeding with endoscopy, it was  recommended that we proceed with right and left heart catheterization to  assess her hemodynamics and rule out significant coronary artery disease.  Of note, she has had an echocardiogram done recently in the office which  showed normal left ventricular systolic function with ejection fraction  estimated at 65%.   CATHETERIZATION PROCEDURAL NOTE:  An 8 French sheath was placed in the right  femoral vein, 6 French sheath in the right femoral artery.  Right heart  catheterization was performed with a Swan-Ganz catheter.  Coronary  angiography was performed with standard Judkins 6 French catheters.  Left  ventricular pressure was measured with an angled pigtail catheter.  Because  of renal insufficiency, we did not perform left ventriculography.  Contrast  was Omnipaque.  There were no complications.   CATHETERIZATION RESULTS:   HEMODYNAMICS:  1. Right atrial mean pressure 24.  2. Right ventricular pressure 68/23.  3. Pulmonary artery pressure 68/30.  4. Pulmonary capillary wedge mean pressure 29 with a V-wave 46.  5. Left  ventricular pressure 142/31.  6. Aortic pressure 144/62.  There is no aortic valve gradient.   Cardiac output by the Fick method is 3.9.  Cardiac index 2.1.   CORONARY ARTERIOGRAPHY (RIGHT DOMINANT):  Left main has a distal 20%  stenosis.   Left anterior descending artery has a 20% stenosis in the mid vessel.  The  LAD gives rise to a normal size first diagonal branch which arises from the  very proximal LAD.  There is a 40% stenosis in the ostium of the first  diagonal branch.  There is a large second diagonal branch arising from the  mid LAD which has a 40% stenosis at its ostium.  The remainder of the LAD is  very small caliber.   Left circumflex gives rise to a normal size first obtuse marginal and a  large second obtuse marginal.  There is a 30% stenosis in the mid circumflex  and a 50% stenosis in the proximal portion of the second obtuse marginal  branch.   Right coronary artery  has a diffuse 30% stenosis in the mid vessel.  The  distal right coronary artery gives rise to a large acute marginal which  supplies the distal two thirds of the inferior septum.  There is also a  small posterior descending artery arising from the distal right coronary  artery.   IMPRESSION:  1. Elevated right and left heart filling pressures with severe pulmonary     hypertension.  2. Moderate, but nonobstructive coronary artery disease.   In summary, the patient appears to have congestive heart failure secondary  to a combination of diastolic dysfunction and her atrial fibrillation based  on history of normal ejection fraction by echocardiogram and nonobstructive  coronary artery disease.   PLAN:  The patient will be managed medically.                                               Carole Binning, M.D. [facility]    MWP/MEDQ  D:  03/27/2004  T:  03/27/2004  Job:  [id number]   cc:   Titus Dubin. Alwyn Ren, M.D. [facility]

## 2011-04-27 NOTE — H&P (Signed)
[facility]  Patient:    [name], KRYSIAK                     MRN: [id number] Adm. Date:  [id number] Attending:  Ardelle Anton                         History and Physical  DATE OF BIRTH:  1929/12/06.  CHIEF COMPLAINT:  Weakness and dark stools.  HISTORY OF PRESENT ILLNESS:  This 77 year old white female with known hypertension, hypothyroidism and atrial fibrillation, on chronic anticoagulation therapy, states for three days she has been having extreme weakness and dark stools.  Apparently, she was vacationing at [facility] and on Wednesday night, started to feel nauseated.  She had her first episode of hematemesis at that time and dark stools.  She apparently thought she had the flu and tried to rest.  Her husband then attempted to try and bring her home and found that she was quite weak.  She was ultimately brought to the emergency room where she was found to have a hemoglobin of 3.7 with an hematocrit of 13.  The patient denies any past history of ulcers or peptic ulcer disease; however, she notes that earlier in the week, she had a toothache and took some aspirin.  FAMILY HISTORY:  The patient was born and raised in [place], [place].  Her father died from a heart attack; mother died from breast cancer.  Siblings:  The patient had four brothers; two died, one from diphtheria and one from pertussis.  She had three sisters, all deceased, one from cancer, one from and aortic aneurysm and one from a hemorrhage.  MEDICATIONS: 1. K-Dur 20 mEq once a day. 2. Premarin 0.625 mg q.d. 3. Diltiazem 120 mg a day. 4. Coumadin 5 mg a day. 5. Toprol XL 100 mg a day. 6. Synthroid 0.075 mg a day. 7. Demadex 20 mg, two in the morning, one in the evening. 8. Restoril 30 mg a day.  SOCIAL HISTORY:  The patient is married and has one daughter.  Cigarettes: ______ .  Alcohol use:  None.  ALLERGIES:  None.  OPERATIONS IN THE PAST:   Hysterectomy, appendectomy and a 1996 cardioversion.  REVIEW OF SYSTEMS:  Notable for hypertension, atrial fibrillation and anticoagulation.  The patient denies any GI or GU pathology.  PRIMARY CARE PHYSICIANS:  Her physician locally is Dr. Titus Dubin. Hopper and her cardiologist is Dr. Dietrich Pates.  PHYSICAL EXAMINATION:  VITAL SIGNS:  Blood pressure is 110/34, pulse 106, respirations 16, percent saturation 100%.  GENERAL APPEARANCE:  Very pale, elderly white female in no acute distress, resting on the emergency room stretcher and wearing oxygen by nasal cannula.  HEENT:  Head was normocephalic and atraumatic.  Ears:  Canals are clear.  TMs gray.  Eyes:  Conjunctivae clear.  Pupils are round and react to light.  Nose: Moist patent passageways with obvious torus palantini.  Pharynx was clear.  NECK:  Supple.  No adenopathy or thyromegaly.  CHEST:  Clear to auscultation laterally.  HEART:  Regular rhythm but a tachycardia.  Normal S1 and S2.  ABDOMEN:  Soft, very doughy and nontender.  EXTREMITIES:  Very pale.  NEUROLOGIC:  Reflexes flat.  BREASTS:  Deferred.  PELVIC:  Deferred.  RECTAL:  Deferred.  LABORATORY STUDIES:  The patient is O-positive.  Her WBC is 11,500, hemoglobin 3.7, hematocrit 13.2, platelet count 376,000.  Differential:  Segs 82, 13 lymphs, 4 monos.  SMA-6 showed a sodium of 141, potassium 4, chloride 110, CO2 23, glucose 203, BUN 0.41, creatinine 1.3.  Pro time was 3.4.  INR of 3.4. PTT was 35.  ASSESSMENT: 1. Severe anemia secondary to a recent gastrointestinal bleed. 2. Anticoagulation for atrial fibrillation. 3. Hypertension. 4. Hypothyroidism. 5. Elevated blood sugar.  PLAN:  Patient will be transfused three units over an hour each.  Her Coumadin will be withheld.  She will be given Prevacid 30 mg b.i.d. and cardiac and GI consults with [place] will be obtained.  Repeat hematocrit in the morning. Blood sugars will be checked to make sure she  stays under control. DD:  04/20/00 TD:  04/22/00 Job: [id number]

## 2011-04-27 NOTE — Discharge Summary (Signed)
NAME:  [name], [name] NO.:  [id number]   MEDICAL RECORD NO.:  [id number]          PATIENT TYPE:  INP   LOCATION:  3714                         FACILITY:  [facility]   PHYSICIAN:  Rene Paci, M.D. LHCDATE OF BIRTH:  08/30/1929   DATE OF ADMISSION:  06/06/2006  DATE OF DISCHARGE:                                 DISCHARGE SUMMARY   DISCHARGE DIAGNOSIS:  1.  Chronic obstructive pulmonary disease exacerbation secondary to left      lower lobe pneumonia, improved.  Continue antibiotic therapy and      prednisone taper.  2.  Pulmonary hypertension by catheterization April 2005; continue chronic      home oxygen for this and chronic obstructive pulmonary disease.  3.  Chronic atrial fibrillation with chronic anticoagulation.  Outpatient      followup with Coumadin Clinic.  Discharge INR 3.1.  4.  History of diastolic congestive heart failure, euvolemic and compensated      at present.  5.  Hypothyroidism.  6.  History of gout.  7.  History of hypertension.  8.  History of steroid-induced diabetes.   DISCHARGE MEDICATIONS:  1.  Omnicef 300 mg p.o. b.i.d. x10 days to complete a total 2-week course.  2.  Prednisone taper 20 mg tablets provided; 60 x3 days, then 40 x3 days,      then 20 x3 days, then 10 x4 days, then stop.  3.  Phenergan with Codeine cough syrup one teaspoon q.4-6h. p.r.n. cough.  4.  Coumadin 2.5 mg daily or as per instructions from Coumadin Clinic.   All other medications are as prior to admission without change and include:  1.  Lisinopril 20 mg p.o. daily.  2.  Zantac 150 mg p.o. b.i.d.  3.  Toprol-XL 50 mg p.o. daily.  4.  Demadex 40 mg p.o. q.a.m. plus 20 mg p.o. q.p.m.  5.  Synthroid 75 mcg p.o. daily.  6.  Diltiazem CD 300 mg p.o. daily.  7.  Nu-Iron 150 mg p.o. b.i.d.  8.  Vitamin C 500 mg b.i.d.  9.  Allopurinol 100 mg p.o. daily.  10. Darvocet p.r.n.   DISPOSITION:  The patient is discharged home in medically stable and  improved  condition.  She continues on her 2 or 3 L per minute nasal cannula  chronically as prior to admission and is saturating 97% with markedly  improved shortness of breath and decreased cough.  Hospital followup will be  with primary care physician, Dr. Marga Melnick; to call for appointment in  1-2 weeks or as needed up.  Also with Dr. Shelby Dubin at [facility] scheduled for Monday June 17, 2006, at 1:30 p.m.   CONDITION ON DISCHARGE:  Medically improved and stable.   HOSPITAL COURSE BY PROBLEM:  CHRONIC OBSTRUCTIVE PULMONARY DISEASE  EXACERBATION SECONDARY TO PNEUMONIA.  The patient is a pleasant 77 year old  woman with history of O2-dependent COPD and pulmonary hypertension who had  been treated for 1 week prior to admission with a 1-week course of Avelox  and cough suppressants.  Initially felt better but then relapsed in her  symptoms and thus was admitted for further evaluation and treatment.  Chest  x-ray showed left lower lobe infiltration consistent with pneumonia as well  as crackles with exam.  She was treated with IV Rocephin and azithromycin as  well as IV steroids for wheezing, nebulizers, and O2 as needed.  She had  great improvement in her symptoms.  Also with productive cough treated with  some Phenergan with codeine.  After 3 days of IV antibiotics with  improvement she was changed to p.o. prednisone as well as p.o. Omnicef for  continued coverage of community-acquired organisms, having failed previous  Avelox.  She continued to improve and by July 2 was felt stable for  discharge.  No other changes have been made in her regimen.  Medical  problems are as listed above.      Rene Paci, M.D. [facility]  Electronically Signed     VL/MEDQ  D:  06/10/2006  T:  06/10/2006  Job:  [id number]

## 2011-04-27 NOTE — Discharge Summary (Signed)
[facility]  Patient:    [name], [name]                     MRN: [id number] Adm. Date:  [id number] Disc. Date: [id number] Attending:  Ardelle Anton CC:         Titus Dubin. Alwyn Ren, M.D. [facility]             John N. Eda Keys., M.D. [facility]             Dietrich Pates, M.D., [facility]                           Discharge Summary  HISTORY OF PRESENT ILLNESS:  [name] is a 77 year old white female, a patient of Titus Dubin. Alwyn Ren, M.D., who presented to the emergency room with complaints of weakness.  She was vacationing at [facility], began to feel nauseated, got weak, and felt that her knees were going to bring her to the ground.  She was brought to the emergency room where she was found to have a hemoglobin of 3.7 with a hemoglobin of 13.  PAST MEDICAL HISTORY:  Significant for hypertension, hypothyroidism, and atrial fibrillation on chronic anticoagulation therapy.  Please see the dictated history and physical for further details.  HOSPITAL COURSE:  #1 - GASTROINTESTINAL:  The patient was found to have a hemoglobin of 3.7 on admission.  The INR was 3.5.  The patient was seen in consultation by [facility].  She underwent EGD on Apr 23, 2000. There were multiple antral ulcers measuring 5-8 mm.  CLOtest biopsy was negative.  There was no active bleeding or stigmata of bleeding.  She did have a questionable short segment of Barretts esophagus, which was biopsied.  The pathology report revealed Barretts esophagus.  No dysplasia or malignancy was identified.  The patient was originally placed on IV Protonix.  She was subsequently switched to p.o. Protonix and will be discharged on it.  Her Coumadin was reversed and will be held for the next month due to her severe anemia with upper GI bleed.  #2 - ATRIAL FIBRILLATION:  The patient remained rate controlled.  We did discuss the risk of withdrawing her Coumadin in the face of atrial fibrillation.  She  understands that the risk of CVA is small, but it is real. She also understands that the risk for bleeding and death is much greater in this situation.  She is to follow up with Dietrich Pates, M.D., for her cardiovascular needs.  She is also to be off aspirin and nonsteroidals until repeat endoscopy is performed.  On the day of discharge, her hemoglobin is 8.6 and stable.  She did receive four units of packed red blood cells.  FINAL DIAGNOSES: 1. Upper gastrointestinal bleed with multiple antral ulcers. 2. Coumadin anticoagulation. 3. Atrial fibrillation. 4. Hypothyroidism. 5. Status post hysterectomy and appendectomy.  DISCHARGE MEDICATIONS: 1. Demodex 20 mg two q.a.m. and one q.p.m. 2. K-Dur 20 mEq p.o. q.d. 3. Synthroid 0.075 mg p.o. q.d. 4. Diltiazem CD 120 mg p.o. q.d. 5. Toprol XL 100 mg p.o. q.d. 6. Temazepam 30 mg p.o. q.h.s. 7. Premarin 0.625 mg p.o. q.d. 8. Protonix 40 mg p.o. q.d.  DIET:  Low salt.  ACTIVITY:  As tolerated.  FOLLOW-UP:  Follow up with Titus Dubin. Alwyn Ren, M.D., in approximately one week for repeat CBC.  She is also to follow up with Wilhemina Bonito. Eda Keys., M.D., for repeat endoscopy  on May 27, 2000, at 1:30 p.m. DD:  04/24/00 TD:  04/29/00 Job: [id number]

## 2011-04-27 NOTE — Letter (Signed)
December 30, 2006    Coralyn Helling, MD  [address]  [place], [place] [address]   RE:  [name], [name]  MRN:  [id number]  /  DOB:  1929-11-18   Dear Laurier Nancy,   I saw [name] on December 18, 2006 complaining of persistent pedal  edema despite taking torsemide 2 in the morning and 1 in the evening.  You had a concern that she may have sleep disorder breathing and  scheduled an overnight polysomnogram.  Unfortunately, she was admitted  shortly after your evaluation with a COPD exacerbation and left lower  lobe pneumonia.  She has a prior history pulmonary hypertension  documented on catheterization in April 2005 and remains on chronic  oxygen therapy at home.   Additionally she remains on Coumadin for chronic atrial fibulation.  She  has a history of diastolic congestive heart failure.   She has renal insufficiency which is felt to be stable by Dr. Marina Gravel.   Dr. Dietrich Pates has been monitoring in a diastolic dysfunction and  following her BNP.  In June 2007, it was 202 and subsequently found to  be 525.   She does feel that the torsemide is having less effect; she even became  more diligent in sodium restriction for 3 days without improvement.   She continues to have paroxysmal nocturnal dyspnea after several hours  of sleep.   Her weight was up 5 pounds to 197, pulse was 68 with some respiratory  variation.  Respiratory rate was 20 on supplemental oxygen.  Blood  pressure was 138/68.  Heart sounds were distant; some mild rales were  noted at the bases.  Pedal pulses were nonpalpable due to the edema  which was 1+ at the mid-ankle.   Her hematocrit was 29.3; during her hospitalization it ranged between  9.4-33.3.  BUN was 36 and creatinine 1.3; the creatinine at 1.5 in June.  Her BNP is now 359, which is basically the range she typically runs,  based on Dr. Gunnar Fusi Ross's note.   I have requested a copy of this sleep study to be sent to you;  would  you please schedule a  follow up for [name]?    Sincerely,      Titus Dubin. Alwyn Ren, MD,FACP,FCCP  Electronically Signed    WFH/MedQ  DD: 12/30/2006  DT: 12/30/2006  Job #: [id number]

## 2011-04-27 NOTE — Assessment & Plan Note (Signed)
[facility]                             PULMONARY OFFICE NOTE   NAME:[name], [name]                     MRN:          [id number]  DATE:03/28/2007                            DOB:          18-Dec-1928    I saw [name] in followup today after she had undergone her CPAP  titration study which was done on March 04, 2007.   She was titrated to a CPAP pressure setting of 14 cm of water, with the  addition of 3 L of supplemental oxygen, with a reduction in her  apnea/hypopnea index to 0.8, and stabilization of her oxygenation at  night.   I had reviewed the results of the study with [name].  She is  currently using [facility] for her supplemental oxygen supplies,  and I will have her be supplied with her CPAP machine as well.  I  discussed various techniques with regards to climatizing to CPAP  therapy.   I plan to followup with her in approximately 6-8 weeks to assess her  tolerance of CPAP therapy.     Coralyn Helling, MD  Electronically Signed    VS/MedQ  DD: 03/28/2007  DT: 03/28/2007  Job #: [id number]   cc:   Titus Dubin. Alwyn Ren, MD,FACP,FCCP  Pricilla Riffle, MD, [facility]

## 2011-04-27 NOTE — Discharge Summary (Signed)
NAME:  [name], [name]                        ACCOUNT NO.:  [id number]   MEDICAL RECORD NO.:  [id number]                   PATIENT TYPE:  INP   LOCATION:  4738                                 FACILITY:  [facility]   PHYSICIAN:  Rene Paci, M.D. [facility]          DATE OF BIRTH:  [birth year]   DATE OF ADMISSION:  03/23/2004  DATE OF DISCHARGE:  03/29/2004                                 DISCHARGE SUMMARY   DISCHARGE DIAGNOSES:  1. Dyspnea.  2. Respiratory insufficiency.  3. Iron deficiency, anemia.  4. Diastolic disfunction.   BRIEF ADMISSION HISTORY:  Ms. [name] is a 77 year old white female who was  admitted by the cardiology office on March 23, 2004 with complaints of  shortness of breath.  She was found to have a hemoglobin of 6.  Patient was  admitted for further evaluation.   PAST MEDICAL HISTORY:  1. Hypertension.  2. Atrial fibrillation status post cardioversion in 1996.  3. Chronic anticoagulation.  4. Peptic ulcer disease.  History of GI bleed.  5. Adult onset diabetes mellitus.  6. History of CVA in 2002.  7. Gout.  8. Status post hysterectomy.  9. Status post removal of the lower extremity mole.  10.      Bladder incontinence.  11.      Status post appendectomy.  12.      Hypothyroidism.   HOSPITAL COURSE:  PROBLEM #1 - CARDIOVASCULAR:  The patient was admitted  with complaint of shortness of breath.  She also complained of dyspnea on  exertion.  The patient was admitted to rule out cardiac etiology for her  complaints.  The patient underwent a cardiac cath on March 27, 2004.  This  revealed moderate nonobstructive coronary artery disease.  It was also  suspected that she may have had some underlying diastolic dysfunction.  The  patient was about to perhaps have underlying congestive heart failure  secondary to diastolic dysfunction and atrial fibrillation.  It was noted  that patient had an abnormal EF by TE echo.  They did recommend continuing  her diuretics.  PROBLEM #2 - GI:  The patient was presented with a hemoglobin of 6.  She was  transfused 2 units of packed red blood cells with good response.  Iron  studies were consistent with the iron deficiency.  Patient has a history of  peptic ulcer disease while on NSAIDS, I believe.  The patient underwent an  endoscopy on March 28, 2004 revealing hiatal hernia.  She also underwent a  colonoscopy on March 28, 2004 revealing colon polyps and internal  hemorrhoids.  It was felt that the patient was able to resume her Coumadin.  It was also felt she should continue her Proton pump inhibitor and we will  supplement with iron as well.  PROBLEM #3 - PULMONARY:  The patient had some respiratory insufficiency and  hyperoxemia.  This is currently felt to be multi-bacterial secondary to the  diastolic dysfunction, AFib, and subsequently congestive heart failure.  Patient does not have a history of COPD but by history it sounds like she  may have some obesity hypoventilation syndrome or some sleep apnea.  Prior  to discharge, we will repeat her chest x-ray and ABG.  She may need an  outpatient sleep study arranged.   LABORATORY DATA AT DISCHARGE:  Hemoglobin 9.6, hematocrit 29.7.  Stool for  occult blood was negative.  Pro-time was 14.5, INR 1.2, BUN 29, creatinine  1.4.  Total iron was 30, TIBC 394, percent saturation 8, B-12 599, ferritin  was 10.   MEDICATIONS AT DISCHARGE:  1. Synthroid 75 mcg daily.  2. Allopurinol 300 ml, 1/2 tab daily.  3. Toprol XL 100 mg daily.  4. Altace 10 mg b.i.d.  5. Norvasc 5 mg daily.  6. Protonix 40 mg daily.  7. Demadex 20 mg b.i.d.  8. Cardizem CD 240 mg daily.  9. Coumadin 5 mg daily.  10.      Restoril 30 mg h.s. p.r.n.  11.      Nu-Iron 150 mg b.i.d.   FOLLOW UP:  Coumadin clinic on Monday, April 25 at 9:30 a.m. and with Dr.  Clearance Coots on Thursday, April 28 at 1:00 p.m.  Follow up on her dyspnea.      Cornell Barman, P.A. [facility]                  Rene Paci,  M.D. [facility]    LC/MEDQ  D:  03/29/2004  T:  03/30/2004  Job:  [id number]   cc:   Leonette Most A. Clearance Coots, M.D.   Miguel Aschoff, M.D.  [address], [address]  [place]  [place] [address]  Fax: [phone]

## 2011-04-27 NOTE — Consult Note (Signed)
NAME:  [name], [name]                        ACCOUNT NO.:  [id number]   MEDICAL RECORD NO.:  [id number]                   PATIENT TYPE:  OIB   LOCATION:  4738                                 FACILITY:  [facility]   PHYSICIAN:  Titus Dubin. Alwyn Ren, M.D. [facility]         DATE OF BIRTH:  08-07-1929   DATE OF CONSULTATION:  03/23/2004  DATE OF DISCHARGE:                                   CONSULTATION   I was asked to see the patient because of anemia.  She had been scheduled  for an elective catheterization today to evaluate progressive shortness of  breath, but was found to have a hemoglobin of 8.8 with an INR of 1.9.   She has a history of severe anemia in the past requiring transfusion.  Four  or five years ago, she states she had bleeding ulcers and her blood count  got down to 3.6.  At this time she has had some gas but denies  dysphagia, dyspepsia, melena, rectal bleeding.  She has had chronic  shortness of breath which has been worse over the last two to three weeks.  She is on Protonix each day.   She states that she had a stroke in the last six months and she sees  Santina Evans A. Orlin Hilding, M.D.  She has had a prior CVA.  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   She denies any bleeding dyscrasias such as hematuria or epistaxis; on  Coumadin she has obviously had trouble stopping the blood when injured and  bruises easily.  There is no family history of gastrointestinal disease.   She is on Diltiazem CD 240 mg daily, allopurinol 300 mg 1/2 daily, Protonix  40 mg each morning, Synthroid 0.075 mg daily, Norvasc 5 mg daily, Toprol XL  100 mg daily, and Altace 10 mg b.i.d., Coumadin 5 mg, Lasix 20 mg twice a  day, and Restoril 30 mg as needed.   She denies any chest pain or abdominal pain.   PHYSICAL EXAMINATION:  GENERAL:  She appears chronically deconditioned.  VITAL SIGNS:  She is 5 foot and weighs  200 pounds, O2 saturations 93% on  room air, blood pressure 117/48, heart rate 77, temperature 97.2.  HEENT:  She has no lymphadenopathy about the head, neck, or axilla.  LUNGS:  Breath sounds are decreased.  Heart sounds are distant.  ABDOMEN:  Abdomen is massive without tenderness or organomegaly.  EXTREMITIES:  There is some mild irritation over the right inferior shin.  She has varicose veins and stasis dermatitis.  Trace edema is noted.   Her anemia is most likely due to chronic blood loss from a hiatal hernia and  reflux, although, ulcer cannot be ruled out without endoscopy.   The Coumadin will be held as is presently done and she will be continued on  heparin.  Stool cards will collected along  with iron, iron binding capacity,  ferritin levels, and B12 levels.  The Protonix will be increased to twice a  day.  She was instructed to let the nurses know if she has a bowel movement  so it can be checked.                                              Titus Dubin. Alwyn Ren, M.D. [facility]   WFH/MEDQ  D:  03/23/2004  T:  03/24/2004  Job:  [id number]

## 2011-04-27 NOTE — Procedures (Signed)
[facility]  Patient:    [name], [name]                     MRN: [id number] Proc. Date: 04/22/00 Adm. Date:  [id number] Attending:  Ardelle Anton CC:         Mahala Menghini. Evonnie Dawes, M.D. [facility]                           Procedure Report  PROCEDURE PERFORMED:  Esophagogastroduodenoscopy with biopsy.  ENDOSCOPIST:  Wilhemina Bonito. Eda Keys., M.D. [facility]  INDICATIONS FOR PROCEDURE:  GI bleed.  HISTORY:  The patient is a 77 year old white female with chronic atrial fibrillation for which she is on Coumadin.  She presented to the hospital recently with upper GI bleeding.  Hemoglobin was 3.7.  INR 3.5.  Her coagulopathy has been corrected.  She has been transfused.  She is stable.  She is now for upper endoscopy.  The nature of the procedure as well as the risks, benefits and alternatives were reviewed.  She understood and agreed to proceed.  PHYSICAL EXAMINATION:  Well-appearing female in no acute distress.  She is alert and oriented.  Vital signs are stable.  Lungs are clear.  Heart is regular. Abdomen is obese and soft.  DESCRIPTION OF PROCEDURE:  After informed consent was obtained the patient was sedated with 40 mg of Demerol and 3 mg of Versed IV.  The Olympus endoscope was  passed orally under direct vision into the esophagus.  The esophagus revealed three tongues of short segment columnar type mucosa consistent with Barretts esophagus. Gastroesophageal junction was estimated to be 35 cm from the incisors.  The tongues of columnar type epithelium extended for approximately 1.5 cm.  The stomach revealed a sliding hiatal hernia.  In addition, multiple clean based antral ulcers. Six were identified.  The measured between 5 and 8 mm.  Biopsies of the antrum ere taken for CLO testing.  There was no active bleeding.  No stigmata of recent bleed. The duodenal bulb and postbulbar duodenum were normal.  Biopsies of the esophagus were also taken to exclude Barretts  esophagus.  IMPRESSION: 1. Recent gastrointestinal bleed secondary to multiple antral ulcers in the face    of anticoagulation therapy. 2. Rule out Barretts esophagus.  RECOMMENDATIONS: 1. Follow up biopsies. 2. Follow up CLO test and treat if positive. 3. Protonix 40 mg daily indefinitely. 4. Avoid aspirin and nonsteroidal anti-inflammatory drugs. 5. Hold Coumadin until repeat endoscopy. 6. Repeat endoscopy in approximately one month to assure ulcer healing. 7. Advance diet with anticipated discharge home later today or tomorrow morning. DD:  04/22/00 TD:  04/24/00 Job: [id number]

## 2011-04-27 NOTE — Assessment & Plan Note (Signed)
[facility]                              CARDIOLOGY OFFICE NOTE   NAME:[name], [name] WOLDT                     MRN:          [id number]  DATE:09/26/2006                            DOB:          1929/04/20    IDENTIFICATION:  The patient is a 77 year old woman with a history of  diastolic dysfunction, shortness of breath, atrial fibrillation,  hypertension, last seen in cardiology clinic back in September.   When last seen, she was switched over to Cozaar 100 daily for protection of  airway stability.   On talking to the patient, she says she is breathing about the same.  Has  not noticed much difference.  Still has some cough with yellowish sputum at  times, no fevers.  Activity as tolerated.   CURRENT MEDICATIONS:  1. Diltiazem CR 300 daily.  2. Niferex 150 two b.i.d.  3. Coumadin as directed.  4. Synthroid 0.075 daily.  5. Toprol-XL 50 daily.  6. Vitamin C b.i.d.  7. Torsemide 40 q.a.m., 20 q.p.m.  8. Cozaar 100 daily.   PHYSICAL EXAMINATION:  GENERAL:  The patient is in no distress.  VITAL SIGNS:  Blood pressure is 142/68, pulse is 61, weight 197, up 4 pounds  from previous.  LUNGS:  Clear without wheezes today.  CARDIAC:  Irregularly irregular, S1, S2.  No S3, no significant murmurs.  EXTREMITIES:  Trivial edema.   IMPRESSION:  1. Diastolic dysfunction, not wheezing today.  Again, I am not sure with      her size fully her volume status.  Still, compared to last exam her air      is moving easier.  Would continue on current regimen.  2. Atrial fibrillation.  Continue rate control.  3. Hypertension, adequate control.   I will set to see the patient back in the spring, sooner if problems  develop.    ______________________________  Pricilla Riffle, MD, [facility]    PVR/MedQ  DD: 09/27/2006  DT: 09/29/2006  Job #: [id number]

## 2011-04-27 NOTE — Assessment & Plan Note (Signed)
[facility]                              CARDIOLOGY OFFICE NOTE   NAME:Kulikowski, COURTNIE BRENES                     MRN:          [id number]  DATE:08/26/2006                            DOB:          05-Nov-1929    IDENTIFICATION:  Ms. [name] is a 77 year old woman with a history of  preserved LV function, diastolic dysfunction, chronic shortness of breath  with COPD.  She was last seen in the cardiology clinic back in July by Tereso Newcomer, PA-C.  She was felt to show some signs of volume increase and it was  recommended she increase her Demadex for a few days.  Note, she was  recovering at the time from a pneumonia.   She did not change her Demadex, just worked with her diet and p.o. intake.  Her breathing may have improved some.  Ankle edema did improve.  Still she  gives out with activity, is short of breath.  Still having cough with some  sputum production.  Sputum is mild yellow.  Is coughing actually all the  time.  Feels some congested.  No fevers.  Mild wheeze with forced  expiration.   CURRENT MEDICATIONS:  1. Diltiazem CD 300 daily.  2. Niferex 150 two b.i.d.  3. Coumadin as directed.  4. Lisinopril 20 daily.  5. Synthroid 0.075 daily.  6. Toprol XL 50 daily.  7. Vitamin C b.i.d.  8. Torsemide 40 q.a.m. and 20 q.p.m.   PHYSICAL EXAMINATION:  GENERAL APPEARANCE:  Patient is in no distress.  VITAL SIGNS:  Blood pressure 126/86, pulse 57, weight 193.  NECK:  JVP approximately 9-10.  LUNGS:  Mild crackles at base with mild wheeze on forced expiration.  CARDIOVASCULAR:  Irregular rate and rhythm, S1 and S2, no S3.  No  significant murmurs.  ABDOMEN:  Soft, nontender, no hepatomegaly.  EXTREMITIES:  Trivial edema.  Note varicosities and chronic skin changes.   IMPRESSION:  1. Diastolic dysfunction.  She does have some wheezing on exam, whether      this is her chronic obstructive pulmonary disease or fluid, will check      a BMET as well  as a BNP.  May indeed go up on her Torsemide a bit.  I      am not sure if the BNP would actually be helpful but we will see.      Note, it was 525 this summer.  Will compare 202 in June.  2. Atrial fibrillation.  Continue rate control.  Note, last Holter monitor      in May, average heart rate was 53 beats per minute, longest pause 2.9      seconds.  I told her the time to cut the Toprol in half.  It is not      clear if she has done this actually.  3. Hypertension.  Again, will need to review the medicine doses with the      patient.  With her lisinopril and her airway disease, I would switch      her over to Cozaar 100 daily.  This may protect  some airway and      stability.   I will set to see the patient back in six weeks.  Will be in touch with her  regarding blood work and medications.   ADDENDUM:  A 12-lead EKG with atrial fibrillation, rate 57 beats per minute.  Right bundle branch block.  T-wave inversion V4, V2-V6, I, II and III.  Unchanged from previous.                                Pricilla Riffle, MD, [facility]    PVR/MedQ  DD:  08/26/2006  DT:  08/27/2006  Job #:  [id number]   cc:   Titus Dubin. Alwyn Ren, MD,FACP,FCCP

## 2011-04-27 NOTE — Procedures (Signed)
NAME:  [name], [name] NO.:  [id number]   MEDICAL RECORD NO.:  [id number]          PATIENT TYPE:  OUT   LOCATION:  SLEEP CENTER                 FACILITY:  [facility]   PHYSICIAN:  Barbaraann Share, MD,FCCPDATE OF BIRTH:  [birth year]31[birth year]01[birth year]   DATE OF STUDY:  01/20/2007                            NOCTURNAL POLYSOMNOGRAM   INDICATIONS FOR STUDY:  Hypersomnia with sleep apnea.   EPWORTH SLEEPINESS SCORE:  Thirteen.   SLEEP ARCHITECTURE:  The patient has total sleep time of __________  minutes with very little REM or slow wave sleep.  Sleep onset latency  was normal as was REM onset.  Sleep efficiency was significantly  decreased at 65%.  It should be noted that the patient did take  temazepam 30 mg prior to starting the sleep study.   RESPIRATORY DATA:  The patient was found to have 4 hypopneas, and 23  obstructive apneas for a respiratory disturbance index of 6 events per  hour.  The events were not positional and there was mild to moderate  snoring noted throughout.  The patient did not meet split-night criteria  secondary to the small number of events.   OXYGEN DATA:  There was O2 desaturation close to 82% with the patient's  obstructive events.  It should be noted the patient's study was done on  2.5 liters of nasal oxygen as she usually wears at home.   CARDIAC DATA:  The patient was found to have a irregular rhythm with no  obvious P wave noted on the majority of the QRS complexes.  This may be  consistent with atrial fibrillation with a controlled ventricular  response.  Clinical correlation is suggested.   MOVEMENT-PARASOMNIA:  None.   IMPRESSIONS-RECOMMENDATIONS:  1. Very mild obstructive sleep apnea/hypopnea syndrome with a      respiratory disturbance index of 6 events per hour and oxygen      desaturation as low as 82%.  The patient's degree of sleep apnea      may be under estimated by the fact that she had very little slow      wave sleep or REM.  Clinical  correlation is suggested.  The patient      did not meet split-night criteria secondary to the small numbers of      events.  2. The patient's study was done on 2.5 liters of nasal oxygen as she      normally wears at home.  There was mild      desaturation with her obstructive events, however, no significant      desaturation in the absence of sleep disordered breathing.      Barbaraann Share, MD,FCCP  Diplomate, American Board of Sleep  Medicine  Electronically Signed     [facility]/MEDQ  D:  01/30/2007 16:12:15  T:  01/30/2007 81:19:14  Job:  [id number]

## 2011-04-27 NOTE — H&P (Signed)
NAME:  [name], [name] NO.:  [id number]   MEDICAL RECORD NO.:  [id number]          PATIENT TYPE:  INP   LOCATION:  3714                         FACILITY:  [facility]   PHYSICIAN:  Titus Dubin. Alwyn Ren, M.D. [facility] OF BIRTH:  [birth year]   DATE OF ADMISSION:  06/06/2006  DATE OF DISCHARGE:                                HISTORY & PHYSICAL   HISTORY OF PRESENT ILLNESS:  Ms. [name] is a 77 year old white  female admitted with a chronic obstructive pulmonary disease flare,  unresponsive to six days of Avelox 400 mg.  She was seen on May 30, 2006  with a bad cold.  This was manifested as chest congestion with dark green  sputum.  She had also noted some traces of blood or hemoptysis initially.  She was experiencing fever to a nominal level as well as chills and sweats.  She also questioned having halitosis.  She denied nasal obstruction or  facial pain or purulence from the nose.  She did have some loss of smell.  She was treating the symptoms with Tylenol.   On examination she was afebrile and had no lymphadenopathy.  The breath  sounds were decreased.  She did have low-grade wheezing.  Heart sounds were  distant, but she did not have a tachycardia.  She was placed on Avelox 400  mg, one sample and a prescription for five pills.  She was given albuterol  meter dose inhaler to be used q.4h. as needed.   Because of the Avelox, she was asked to check her prothrombin time on June 03, 2006.  She returned on June 06, 2006, stating that she was only 40%  better.  She was describing a cough and shortness of breath with chest  tightness and wheezing.  She was finding it difficult to expectorate the  sputum.  It had a decrease in volume and appeared somewhat lighter.  Her  cough was worse supine.  She had been sitting up all night on the couch due  to the cough and shortness of breath.   She has a history of respiratory insufficiency, for which she was  hospitalized in  April 2005.  This was complicated by anemia.  Specifically  her hemoglobin was as low as 6 at that time.  In 1999, she had a stroke in  the right occipital area.  She was hospitalized in 1996, with atrial  fibrillation and was cardioverted.   PAST MEDICAL HISTORY:  1.  Hepatomegaly with elevated liver enzymes.  2.  Gout.  3.  Vertigo.   FAMILY HISTORY:  Her father had a myocardial infarction and stroke.  A  brother had coronary artery bypass graft surgery.  A sister had diabetes.  Her mother died of breast cancer.   SOCIAL HISTORY:  She has never smoked and does not drink.   LABORATORY DATA:  No known drug allergies.   PRESENT MEDICATIONS:  1.  Furosemide 20 mg, two each morning and one each evening.  2.  Toprol XL 100 mg, 1/2 daily.  3.  [facility].  4.  Synthroid 75 mcg daily.  5.  Darvocet-N 100 q.4h. p.r.n.  6.  Allopurinol 300 mg, 1/2 daily.  7.  Diltiazem 300 mg daily.  8.  Ferrex 150 mg b.i.d.  9.  Lisinopril 20 mg daily.  10. Zantac p.r.n.  11. She is on oxygen 2 liters continuously.   REVIEW OF SYSTEMS:  Positive for some chest tightness, but no classic  angina.  She describes fatigue and weakness.   PAST MEDICAL HISTORY:  /ROS:  1.  She is followed by the nephrologist for renal insufficiency.  She has      had no evidence of renal artery stenosis.  Dr. Wilber Bihari. Fox,      nephrologist, is monitoring her renal insufficiency.  Serum protein      electrophoresis and urine protein electrophoresis revealed no monoclonal      protein.  She has had no hydronephrosis on renal ultrasound and as      mentioned, no renal artery stenosis.  2.  She has been followed by her cardiologist, who has felt that she has      volume overload and diastolic dysfunction.  She has permanent atrial      fibrillation, for which she is on the Coumadin.  3.  She has a history of diabetes, according to the cardiologist, but she is      on no medications  for diabetes.  4.  She also has a history of prior GI bleed, related to polyps and internal      hemorrhoids.  5.  She has also been followed by Dr. Coralyn Helling.  He questions sleep      disorder breathing.  A sleep study was pursued by Dr. Craige Cotta.  It is      unclear why she did not return to him with the acute exacerbation of her      pulmonary disease.  6.  She has had elevated pulmonary artery diastolic pressures.  7.  She had been on Temazepam for sleep previously.  8.  She has had asymptomatic cholelithiasis.  She has no active GI symptoms      at this time.  9.  She does have occasional pretibial edema.   PHYSICAL EXAMINATION:  GENERAL:  She appears somewhat chronically ill.  She  is 5 feet and weighs 188 pounds.  VITAL SIGNS:  Temperature 97.4 degrees, pulse 68 and irregular, respirations  28, blood pressure 128/60.  HEENT:  She has arteriole narrowing.  Otolaryngologic exam is unremarkable.  Dental hygiene is fair.  There are no oral lesions, but she is hoarse.  NODES:  She had no lymphadenopathy about the head, neck or axilla.  LUNGS:  Diffuse wheezing is noted in all lung fields.  HEART:  She has distant heart sounds.  On auscultation it is difficult to  determine that she is in atrial fibrillation.  ABDOMEN:  Protuberant without organomegaly, masses or tenderness.  EXTREMITIES:  She had no edema at this time.  Pedal pulses are decreased.  SKIN:  Warm and dry.  NEUROLOGIC:  There are no localizing neuropsychiatric deficits.  She appears  somewhat debilitated  w/o localizing signs.   IMPRESSION:  She is now admitted with a chronic obstructive pulmonary  disease exacerbation with a diagnosis of previous stroke and diastolic  dysfunction and probable sleep disorder, with pulmonary hypertension.  She  has had anemia related to gastrointestinal bleeding, as well as renal  insufficiency.   PLAN: 1.  She will be admitted to  telemetry.  Nebulizer treatments will be       initiated.  She will be on IV antibiotics, specifically Rocephin and      azithromycin.  2.  She will receive an abbreviated course of Solu-Medrol because of the      bronchospasm which has been persistent and did not respond to      bronchodilators topically.  3.  Sodium restriction will be continued.  4.  She will be maintained on oxygen.  The chest tightness does not sound      anginal and most likely is related to reactive airway disease, but      cardiac enzymes will be checked.  5.  She is on allopurinol and her uric acid will be checked.  Optimally this      drug should be discontinued because of her renal insufficiency.  6.  Her thyroid function will also be evaluated.      Titus Dubin. Alwyn Ren, M.D. [facility]  Electronically Signed     [facility]/MEDQ  D:  06/07/2006  T:  06/07/2006  Job:  [id number]   cc:   Pricilla Riffle, M.D.  [address]  [address]  [place]  [place] [address]   Coralyn Helling, M.D.   Richard F. Caryn Section, M.D.  Fax: [phone]

## 2011-04-27 NOTE — Assessment & Plan Note (Signed)
[facility]                             PULMONARY OFFICE NOTE   NAME:Whiston, ALLEY NEILS                     MRN:          [id number]  DATE:02/10/2007                            DOB:          1929-05-03    I saw Ms. Angelo today in followup after she had undergone her  overnight polysomnogram.   This was done on January 20, 2007 and this showed an overall apnea-  hypopnea index of 6 with an oxygen saturation rate of 82%.  Of note is  that she had a significant positional as well as REM effect to her sleep  apnea, and it also appeared that she had difficulty with sleep  maintenance due to respiratory events particularly in the later half of  the night associated with oxygen desaturations.  After reviewing this I  would be suspicious that the severity of her sleep apnea is likely  underestimated due to the decreased amount of supine sleep as well as  REM sleep.   I discussed these results with Ms. Deitrick.  I have reviewed the  diagnosis of sleep apnea, I had also discussed with her the adverse  consequences of untreated sleep apnea include increased risk of  hypertension, coronary disease, cerebrovascular disease, and diabetes.  The importance of diet, exercise, and weight reduction was discussed  with her as well as avoidance of alcohol and sedatives.  Driving  precautions were discussed with her as well, although Ms .Murtha says  she does not drive.  I had reviewed various treatment options for her  sleep apnea including CPAP therapy, oral appliance, and surgical  intervention.  At this time she is agreeable to undergo CPAP therapy.  I  will therefore make arrangements for her to be returned to the sleep lab  for a CPAP titration study and then after reviewing this, initiate her  on CPAP therapy.     Coralyn Helling, MD  Electronically Signed    VS/MedQ  DD: 02/10/2007  DT: 02/10/2007  Job #: [id number]   cc:   Titus Dubin. Alwyn Ren, MD,FACP,FCCP  Pricilla Riffle, MD, [facility]

## 2011-04-27 NOTE — Assessment & Plan Note (Signed)
[facility]                              CARDIOLOGY OFFICE NOTE   NAME:Yorks, SUMAYAH BEARSE                     MRN:          [id number]  DATE:06/26/2006                            DOB:          [birth year]    SUBJECTIVE:  Ms. Needles is a very pleasant 77 year old female patient  followed by Dr. Tenny Craw with a history of chronic atrial fibrillation and  diastolic dysfunction who was recently hospitalized for COPD exacerbation  with evidence of left lower lobe pneumonia.  She was treated with  antibiotics and discharged on June 10, 2006.  She followed up with Dr. Alwyn Ren  last week.  She is still coughing, but is slowly improving.  She denies any  purulent sputum or hemoptysis.  She denies any further fevers or chills or  sweats.  She has developed some increasing lower extremity edema, was  somewhat concerned, and came in today for follow-up.  Her breathing is about  the same.  She exhibits mainly New York Heart Association class III  symptoms.  These have not changed significantly.  She sleeps on two pillows  and has continued to do so for quite some time now.  She denies any  paroxysmal nocturnal dyspnea.  She is not weighing herself.   CURRENT MEDICATIONS:  1.  Diltiazem 300 mg daily.  2.  Niferex 150 mg two tablets b.i.d.  3.  Lisinopril 20 mg daily.  4.  Allopurinol 300 mg half tablet daily.  5.  Coumadin as directed.  6.  Synthroid 0.75 mg daily.  7.  Darvocet p.r.n.  8.  Toprol XL 50 mg daily.  9.  Vitamin C.  10. Torsemide 20 mg, 40 in the morning, 20 in the evening.   ALLERGIES:  No known drug allergies.   PHYSICAL EXAMINATION:  GENERAL:  She is a well-nourished, well-developed in  no acute distress.  VITAL SIGNS:  Blood pressure 122/52, pulse 66, weight 192 pounds.  HEENT:  Unremarkable.  NECK:  With JVP at about 5-6 cm.  CARDIAC:  Normal S1, S2.  Irregularly irregular rhythm.  LUNGS:  Decreased breath sounds and left basilar  crackles.  ABDOMEN:  Soft, nontender.  EXTREMITIES:  1+ edema bilaterally, multiple varicosities noted.  Calves are  soft, nontender without palpable cords bilaterally.   IMPRESSION:  1.  Lower extremity edema.  2.  Chronic dyspnea - stable.  3.  Recent chronic obstructive pulmonary disease exacerbation/left lower      lobe pneumonia.      1.  Status post admission and completion of antibiotic therapy.  4.  Diastolic dysfunction.  5.  Normocytic anemia.  6.  Preserved left ventricular function.  7.  Non-obstructive coronary disease by catheterization 2005.  8.  Permanent atrial fibrillation with chronic Coumadin therapy.  9.  Diabetes mellitus.  10. Hypothyroidism.  11. Hypertension.  12. History of cerebrovascular accident.  13. History of gastrointestinal bleed.  14. Rule out sleep apnea.      1.  Currently being evaluated by Dr. Craige Cotta.   PLAN:  The patient does have extra swelling today.  However, her weight  is  stable and is actually down about 3 pounds since we last saw her.  I have  instructed her to go ahead and take an extra torsemide in the evenings for  the next two to three days until her swelling improves in her legs.  She is  to go back to her usual dose after that.  I have also instructed her to  continue to elevate her legs as much as possible when she is not up and  about.  Will check a BMET today and repeat one in a week.  Will also get a  BMP and CBC today.  She was curious about what her hemoglobin is doing since  she has been on iron for some time now.  She will follow up with Dr. Tenny Craw in  about four to six weeks.  She does have some crackles on examination in her  left base.  She did have left lower lobe pneumonia when she was admitted.  Clinically, she is much improved.  Do not think any further treatment or  evaluation is warranted at this time.  She knows if she develops worsening  cough or recurrent fevers or chills she should touch base with either [place]  or  Dr. Alwyn Ren.                                  Tereso Newcomer, PA-C    SW/MedQ  DD:  06/26/2006  DT:  06/26/2006  Job #:  [id number]   cc:   Titus Dubin. Alwyn Ren, MD, FCCP

## 2011-04-27 NOTE — Procedures (Signed)
NAME:  [name], [name] NO.:  [id number]   MEDICAL RECORD NO.:  [id number]          PATIENT TYPE:  OUT   LOCATION:  SLEEP CENTER                 [facility]ACILITY[facility]  [facility]   PHYSICIAN:  Coralyn Helling, MD        DATE OF BIRTH:  April 08, 1929   DATE OF STUDY:  03/04/2007                            NOCTURNAL POLYSOMNOGRAM   REFERRING PHYSICIAN:   INDICATION FOR STUDY:  This is an individual who had an overnight  polysomnogram done on January 20, 2007, which showed an apnea-hypopnea  index of 6.  She does have a history of heart disease and  cerebrovascular disease.  She is referred to the Sleep Lab for a CPAP  titration study.   EPWORTH SLEEPINESS SCORE:  13.   MEDICATIONS:  Synthroid, Xanax, Toprol, torsemide, Cartia, Cozaar,  warfarin, temazepam, Vitamin D and the patient took a dose of temazepam  at 9:10 p.m. on the night of the study.  She also reported taking a nap  for 30 minutes during the day prior to the study.   SLEEP ARCHITECTURE:  Total recording time was 359 minutes.  Total sleep  time was 212 minutes.  Sleep efficiency was 59%, which was significantly  reduced.  The patient has a normal wake up time of 4 a.m. and  essentially did not sleep through the second half of the study.  Sleep  latency was 0.5 minutes, which is significantly reduced, REM latency was  41 minutes, which is reduced.  The patient was observed in all stages of  sleep and she slept predominantly in the supine position.   RESPIRATORY DATA:  The average respiratory rate was 18.  The patient was  titrated from a CPAP pressure setting of 6-15 cmH2O.  At a CPAP pressure  setting of 14 cmH2O, the apnea-hypopnea index was reduced to 0.8.  At  this pressure setting the patient was observed in both REM sleep and  supine sleep and snoring was eliminated.   OXYGEN DATA:  The baseline oxygenation was 92% but this was with 2.5L of  oxygen by nasal cannula which the patient uses continuously.  The oxygen  saturation nadir was 71%.  The oxygen had to be increased to 3L to  maintain her saturations after control of her respiratory events.  At a  CPAP pressure setting of 14 cmH2O with 3L oxygen the mean oxygen during  non-REM sleep was 91%, the mean oxygenation during REM sleep was 89%,  the minimal oxygenation during non-REM sleep was 87%, and the minimal  oxygenation during REM sleep was 82%.   CARDIAC DATA:  The rhythm strip showed atrial fibrillation.   MOVEMENT-PARASOMNIA:  The periodic limb movement index was 37.8.   IMPRESSIONS-RECOMMENDATIONS:  This is a CPAP titration study.  At a CPAP  pressure setting of 14 cmH2O the apnea-hypopnea index was 0.8.  The  patient was observed in both REM sleep and supine sleep at this pressure  setting.  She required the use of 3 liters of supplemental oxygen to  maintain her oxygen saturations in the  absence of respiratory events.  There was also an elevation in her  periodic limb movement  index and clinical correlation will be necessary  to determine the significance of this.      Coralyn Helling, MD  Diplomat, American Board of Sleep Medicine  Electronically Signed     VS/MEDQ  D:  03/11/2007 15:47:51  T:  03/11/2007 18:41:08  Job:  [id number]

## 2011-04-30 ENCOUNTER — Other Ambulatory Visit: Payer: Self-pay | Admitting: Nephrology

## 2011-04-30 ENCOUNTER — Encounter (HOSPITAL_COMMUNITY): Payer: Medicare Other

## 2011-04-30 LAB — CBC
HCT: 33.8 % — ABNORMAL LOW (ref 36.0–46.0)
Hemoglobin: 10.7 g/dL — ABNORMAL LOW (ref 12.0–15.0)
MCHC: 31.7 g/dL (ref 30.0–36.0)
WBC: 6.7 10*3/uL (ref 4.0–10.5)

## 2011-04-30 LAB — COMPREHENSIVE METABOLIC PANEL
AST: 18 U/L (ref 0–37)
Albumin: 3.9 g/dL (ref 3.5–5.2)
Alkaline Phosphatase: 85 U/L (ref 39–117)
BUN: 115 mg/dL — ABNORMAL HIGH (ref 6–23)
Chloride: 92 mEq/L — ABNORMAL LOW (ref 96–112)
Creatinine, Ser: 2.4 mg/dL — ABNORMAL HIGH (ref 0.4–1.2)
GFR calc Af Amer: 24 mL/min — ABNORMAL LOW (ref >60)
Potassium: 3.1 mEq/L — ABNORMAL LOW (ref 3.5–5.1)
Total Bilirubin: 0.6 mg/dL (ref 0.3–1.2)
Total Protein: 6.7 g/dL (ref 6.0–8.3)

## 2011-04-30 LAB — PHOSPHORUS: Phosphorus: 4.3 mg/dL (ref 2.3–4.6)

## 2011-04-30 LAB — URIC ACID: Uric Acid, Serum: 6.4 mg/dL (ref 2.4–7.0)

## 2011-05-08 ENCOUNTER — Encounter (HOSPITAL_COMMUNITY): Payer: Medicare Other

## 2011-05-09 ENCOUNTER — Encounter (HOSPITAL_COMMUNITY): Payer: Medicare Other

## 2011-05-09 ENCOUNTER — Other Ambulatory Visit: Payer: Self-pay | Admitting: Nephrology

## 2011-05-09 ENCOUNTER — Encounter: Payer: Self-pay | Admitting: Internal Medicine

## 2011-05-10 ENCOUNTER — Other Ambulatory Visit: Payer: Self-pay | Admitting: Internal Medicine

## 2011-05-10 LAB — POCT HEMOGLOBIN-HEMACUE: Hemoglobin: 9.7 g/dL — ABNORMAL LOW (ref 12.0–15.0)

## 2011-05-14 ENCOUNTER — Ambulatory Visit (INDEPENDENT_AMBULATORY_CARE_PROVIDER_SITE_OTHER): Payer: Medicare Other | Admitting: Internal Medicine

## 2011-05-14 ENCOUNTER — Encounter: Payer: Self-pay | Admitting: Internal Medicine

## 2011-05-14 DIAGNOSIS — E785 Hyperlipidemia, unspecified: Secondary | ICD-10-CM

## 2011-05-14 DIAGNOSIS — I509 Heart failure, unspecified: Secondary | ICD-10-CM

## 2011-05-14 DIAGNOSIS — I4891 Unspecified atrial fibrillation: Secondary | ICD-10-CM

## 2011-05-14 DIAGNOSIS — K648 Other hemorrhoids: Secondary | ICD-10-CM

## 2011-05-14 DIAGNOSIS — I10 Essential (primary) hypertension: Secondary | ICD-10-CM

## 2011-05-14 NOTE — Patient Instructions (Signed)
Your physician wants you to follow-up in: November 2012 with Dr. Filbert Schilder will receive a reminder letter in the mail two months in advance. If you don't receive a letter, please call our office to schedule the follow-up appointment. Your physician has recommended you make the following change in your medication: Stop Aspirin

## 2011-05-15 ENCOUNTER — Encounter (HOSPITAL_COMMUNITY): Payer: Medicare Other | Attending: Nephrology

## 2011-05-15 ENCOUNTER — Other Ambulatory Visit: Payer: Self-pay | Admitting: Nephrology

## 2011-05-15 DIAGNOSIS — D638 Anemia in other chronic diseases classified elsewhere: Secondary | ICD-10-CM | POA: Insufficient documentation

## 2011-05-15 DIAGNOSIS — N183 Chronic kidney disease, stage 3 unspecified: Secondary | ICD-10-CM | POA: Insufficient documentation

## 2011-05-15 LAB — IRON AND TIBC
Iron: 68 ug/dL (ref 42–135)
Saturation Ratios: 26 % (ref 20–55)
UIBC: 196 ug/dL

## 2011-05-15 LAB — FERRITIN: Ferritin: 372 ng/mL — ABNORMAL HIGH (ref 10–291)

## 2011-05-16 NOTE — Assessment & Plan Note (Signed)
Keep on same regimen. 

## 2011-05-16 NOTE — Assessment & Plan Note (Signed)
Currently on aspirin only.  She is too high risk for coumadin rx.

## 2011-05-16 NOTE — Assessment & Plan Note (Signed)
Good control

## 2011-05-16 NOTE — Assessment & Plan Note (Signed)
Patient with restrictive cardiomyopathy.  Volume status does not look bad.  I would keep on same regimen.  She is due for repeat labs soon.

## 2011-05-16 NOTE — Progress Notes (Signed)
HPI  [name] is a 77 year old with a history of restrictive cardiomyopathy, hypertension, dyslipidemia and renal insuffiency. Since I saw her she says she has been feeling pretty good.  She denies the neck fullness that she has when she is volume overloaded.  She denies CP.  Breathing is stable.  Uses oxygen. She still has bright red blood per rectum.  Intermitt clots.  No Known Allergies  Current Outpatient Prescriptions  Medication Sig Dispense Refill  . acetaminophen (TYLENOL) 325 MG tablet Take 650 mg by mouth every 4 (four) hours as needed.        Marland Kitchen epoetin alfa (PROCRIT) 86578 UNIT/ML injection Inject into the skin. Take 1 injection every 2 weeks.       . hydrocortisone (ANUSOL-HC) 25 MG suppository Place 25 mg rectally at bedtime. Use for 10 days.       Marland Kitchen levalbuterol (XOPENEX HFA) 45 MCG/ACT inhaler Inhale 1-2 puffs into the lungs 3 (three) times daily.        Marland Kitchen levothyroxine (SYNTHROID, LEVOTHROID) 75 MCG tablet Take 75 mcg by mouth daily.        Marland Kitchen losartan (COZAAR) 100 MG tablet Take 50 mg by mouth daily.        . NON FORMULARY OXYGEN 2 LITERS       . pantoprazole (PROTONIX) 40 MG tablet TAKE 1 TABLET BY MOUTH TWICE DAILY WITH MEALS  60 tablet  4  . potassium chloride SA (K-DUR,KLOR-CON) 20 MEQ tablet Take 20 mEq by mouth. Take 2 tablets in the morning and 1 tablet at bedtime.       . simvastatin (ZOCOR) 20 MG tablet Take 20 mg by mouth at bedtime.        . torsemide (DEMADEX) 20 MG tablet TAKE 7 TABLETS BY MOUTH DAILY  210 tablet  5  . traMADol (ULTRAM) 50 MG tablet TAKE  1/2-1 TABLET BY MOUTH EVERY 8-12 HOURS AS NEEDED  60 tablet  2  . vitamin C (ASCORBIC ACID) 500 MG tablet Take 500 mg by mouth daily.        Marland Kitchen allopurinol (ZYLOPRIM) 100 MG tablet TAKE 2 TABLETS BY MOUTH EVERY DAY  60 tablet  5  . metolazone (ZAROXOLYN) 2.5 MG tablet Take 2.5 mg by mouth every other day as needed.          Past Medical History  Diagnosis Date  . Restrictive cardiomyopathy   . Atrial  fibrillation 1996    S/P cardioversion  . CVA (cerebral infarction) 1999  . COAD (chronic obstructive airways disease) 2007    Exacerbation  . Sleep apnea   . CPAP (continuous positive airway pressure) dependence   . Anemia     NOS  . Iron deficiency   . Gout   . Peptic ulcer, acute with hemorrhage 2005, 2011  . Hypertension   . Hypothyroidism   . Diastolic dysfunction   . Adenomatous colon polyp   . GERD (gastroesophageal reflux disease)   . Hiatal hernia   . Diverticulosis   . Anxiety disorder   . Arthritis   . Internal and external hemorrhoids without complication   . COPD (chronic obstructive pulmonary disease)   . Morbid obesity   . CAD (coronary artery disease) 08/06/2008    One vessel by cath, EF overall preserved by echo 04/30/2008  . On home oxygen therapy   . Renal failure     Dr. Caryn Section  . Hyperlipidemia     Past Surgical History  Procedure Date  .  Appendectomy   . Oophorectomy   . Incision and drainage of cellulitis 04/2008    Umbilical abcess  . Cardiac catheterization     Pulmonary HTN, non obstructive CAD  . Abdominal hysterectomy     Family History  Problem Relation Age of Onset  . Breast cancer Mother   . Heart disease Father     MI  . Breast cancer Sister   . Heart disease Brother     CABG 5 vessel  . Rheum arthritis Brother   . Colon cancer Neg Hx     History   Social History  . Marital Status: Married    Spouse Name: N/A    Number of Children: N/A  . Years of Education: N/A   Occupational History  . Retired Social worker   Social History Main Topics  . Smoking status: Never Smoker   . Smokeless tobacco: Not on file  . Alcohol Use: No  . Drug Use: Not on file  . Sexually Active: Not on file   Other Topics Concern  . Not on file   Social History Narrative   MarriedOne childNo regular exerciseOxygen dependent Daily caffeine use: one daily07/19/2011 - Designated party form signed appointing spouse [name] or  daughter [name]; OK to leave message on home answering machine at [phone]; [name] [phone]    Review of Systems:  All systems reviewed.  They are negative to the above problem except as previously stated.  Vital Signs: BP 118/52  Pulse 63  Wt 155 lb (70.308 kg)  Physical Exam  Patient is in NAD.  Examined in wheelchair.  HEENT:  Normocephalic, atraumatic. EOMI, PERRLA.  Neck: JVP is normal. No thyromegaly. No bruits.  Lungs: clear to auscultation. No rales no wheezes.  Heart: Regular rate and rhythm. Normal S1, S2. No S3.   No significant murmurs. PMI not displaced.  Abdomen:  Supple, nontender. Normal bowel sounds. No masses. No hepatomegaly.  Extremities:   Good distal pulses throughout. Trace lower extremity edema.  Musculoskeletal :moving all extremities.  Neuro:   alert and oriented x3.  CN II-XII grossly intact.   Assessment and Plan:

## 2011-05-16 NOTE — Assessment & Plan Note (Signed)
Patient continues to have intermitt bleeding.

## 2011-05-22 ENCOUNTER — Encounter (HOSPITAL_COMMUNITY): Payer: Medicare Other

## 2011-05-22 ENCOUNTER — Other Ambulatory Visit: Payer: Self-pay | Admitting: Nephrology

## 2011-05-29 ENCOUNTER — Encounter (HOSPITAL_COMMUNITY): Payer: Medicare Other

## 2011-05-29 ENCOUNTER — Other Ambulatory Visit: Payer: Self-pay | Admitting: Nephrology

## 2011-06-05 ENCOUNTER — Encounter (HOSPITAL_COMMUNITY): Payer: Medicare Other

## 2011-06-05 ENCOUNTER — Other Ambulatory Visit: Payer: Self-pay | Admitting: Nephrology

## 2011-06-07 ENCOUNTER — Other Ambulatory Visit: Payer: Self-pay | Admitting: Internal Medicine

## 2011-06-11 ENCOUNTER — Ambulatory Visit: Payer: Medicare Other | Admitting: Internal Medicine

## 2011-06-19 ENCOUNTER — Other Ambulatory Visit: Payer: Self-pay | Admitting: Nephrology

## 2011-06-19 ENCOUNTER — Encounter (HOSPITAL_COMMUNITY): Payer: Medicare Other | Attending: Nephrology

## 2011-06-19 DIAGNOSIS — D638 Anemia in other chronic diseases classified elsewhere: Secondary | ICD-10-CM | POA: Insufficient documentation

## 2011-06-19 DIAGNOSIS — N183 Chronic kidney disease, stage 3 unspecified: Secondary | ICD-10-CM | POA: Insufficient documentation

## 2011-06-19 LAB — IRON AND TIBC
Iron: 88 ug/dL (ref 42–135)
Saturation Ratios: 33 % (ref 20–55)
TIBC: 265 ug/dL (ref 250–470)
UIBC: 177 ug/dL

## 2011-06-26 ENCOUNTER — Encounter (HOSPITAL_COMMUNITY): Payer: Medicare Other

## 2011-06-26 ENCOUNTER — Other Ambulatory Visit: Payer: Self-pay | Admitting: Nephrology

## 2011-06-26 LAB — POCT HEMOGLOBIN-HEMACUE: Hemoglobin: 10.9 g/dL — ABNORMAL LOW (ref 12.0–15.0)

## 2011-07-03 ENCOUNTER — Other Ambulatory Visit: Payer: Self-pay | Admitting: Nephrology

## 2011-07-03 ENCOUNTER — Encounter (HOSPITAL_COMMUNITY): Payer: Medicare Other

## 2011-07-03 LAB — RENAL FUNCTION PANEL
Albumin: 3.8 g/dL (ref 3.5–5.2)
BUN: 68 mg/dL — ABNORMAL HIGH (ref 6–23)
Chloride: 100 mEq/L (ref 96–112)
Creatinine, Ser: 1.81 mg/dL — ABNORMAL HIGH (ref 0.50–1.10)
GFR calc non Af Amer: 27 mL/min — ABNORMAL LOW (ref >60)
Phosphorus: 4 mg/dL (ref 2.3–4.6)
Potassium: 3.2 mEq/L — ABNORMAL LOW (ref 3.5–5.1)

## 2011-07-03 LAB — POCT HEMOGLOBIN-HEMACUE: Hemoglobin: 11.4 g/dL — ABNORMAL LOW (ref 12.0–15.0)

## 2011-07-04 LAB — PTH, INTACT AND CALCIUM
Calcium, Total (PTH): 9.8 mg/dL (ref 8.4–10.5)
PTH: 119.4 pg/mL — ABNORMAL HIGH (ref 14.0–72.0)

## 2011-07-06 ENCOUNTER — Other Ambulatory Visit: Payer: Self-pay | Admitting: Internal Medicine

## 2011-07-06 DIAGNOSIS — I6529 Occlusion and stenosis of unspecified carotid artery: Secondary | ICD-10-CM

## 2011-07-09 ENCOUNTER — Other Ambulatory Visit: Payer: Self-pay | Admitting: *Deleted

## 2011-07-09 ENCOUNTER — Encounter: Payer: Self-pay | Admitting: Internal Medicine

## 2011-07-09 ENCOUNTER — Encounter (INDEPENDENT_AMBULATORY_CARE_PROVIDER_SITE_OTHER): Payer: Medicare Other | Admitting: *Deleted

## 2011-07-09 DIAGNOSIS — I6529 Occlusion and stenosis of unspecified carotid artery: Secondary | ICD-10-CM

## 2011-07-10 ENCOUNTER — Other Ambulatory Visit: Payer: Self-pay | Admitting: Internal Medicine

## 2011-07-10 ENCOUNTER — Other Ambulatory Visit: Payer: Self-pay | Admitting: *Deleted

## 2011-07-10 DIAGNOSIS — R0602 Shortness of breath: Secondary | ICD-10-CM

## 2011-07-10 DIAGNOSIS — I6529 Occlusion and stenosis of unspecified carotid artery: Secondary | ICD-10-CM

## 2011-07-10 DIAGNOSIS — I509 Heart failure, unspecified: Secondary | ICD-10-CM

## 2011-07-11 ENCOUNTER — Encounter: Payer: Self-pay | Admitting: *Deleted

## 2011-07-12 ENCOUNTER — Ambulatory Visit (INDEPENDENT_AMBULATORY_CARE_PROVIDER_SITE_OTHER): Payer: Medicare Other | Admitting: *Deleted

## 2011-07-12 ENCOUNTER — Other Ambulatory Visit: Payer: Self-pay | Admitting: *Deleted

## 2011-07-12 DIAGNOSIS — R0602 Shortness of breath: Secondary | ICD-10-CM

## 2011-07-12 DIAGNOSIS — I509 Heart failure, unspecified: Secondary | ICD-10-CM

## 2011-07-12 LAB — BASIC METABOLIC PANEL
CO2: 28 mEq/L (ref 19–32)
Calcium: 9.3 mg/dL (ref 8.4–10.5)
Creatinine, Ser: 2 mg/dL — ABNORMAL HIGH (ref 0.4–1.2)
GFR: 25.91 mL/min — ABNORMAL LOW (ref >60.00)
Glucose, Bld: 81 mg/dL (ref 70–99)
Sodium: 141 mEq/L (ref 135–145)

## 2011-07-12 LAB — CBC WITH DIFFERENTIAL/PLATELET
Lymphocytes Relative: 14.6 % (ref 12.0–46.0)
Lymphs Abs: 0.9 10*3/uL (ref 0.7–4.0)
MCHC: 32.3 g/dL (ref 30.0–36.0)
MCV: 102.1 fl — ABNORMAL HIGH (ref 78.0–100.0)
Neutro Abs: 4.2 10*3/uL (ref 1.4–7.7)
Neutrophils Relative %: 71.1 % (ref 43.0–77.0)
Platelets: 128 10*3/uL — ABNORMAL LOW (ref 150.0–400.0)
RBC: 3.02 Mil/uL — ABNORMAL LOW (ref 3.87–5.11)

## 2011-07-17 ENCOUNTER — Encounter (HOSPITAL_COMMUNITY): Payer: Medicare Other | Attending: Nephrology

## 2011-07-17 ENCOUNTER — Other Ambulatory Visit: Payer: Self-pay | Admitting: Nephrology

## 2011-07-17 DIAGNOSIS — D638 Anemia in other chronic diseases classified elsewhere: Secondary | ICD-10-CM | POA: Insufficient documentation

## 2011-07-17 DIAGNOSIS — N183 Chronic kidney disease, stage 3 unspecified: Secondary | ICD-10-CM | POA: Insufficient documentation

## 2011-07-24 ENCOUNTER — Other Ambulatory Visit: Payer: Self-pay | Admitting: Nephrology

## 2011-07-24 ENCOUNTER — Encounter (HOSPITAL_COMMUNITY): Payer: Medicare Other

## 2011-07-24 LAB — IRON AND TIBC
Saturation Ratios: 28 % (ref 20–55)
TIBC: 267 ug/dL (ref 250–470)

## 2011-07-24 LAB — BASIC METABOLIC PANEL
BUN: 73 mg/dL — ABNORMAL HIGH (ref 6–23)
Chloride: 101 mEq/L (ref 96–112)
Glucose, Bld: 91 mg/dL (ref 70–99)
Potassium: 3.9 mEq/L (ref 3.5–5.1)

## 2011-07-26 ENCOUNTER — Other Ambulatory Visit: Payer: Medicare Other

## 2011-07-31 ENCOUNTER — Other Ambulatory Visit: Payer: Self-pay | Admitting: Nephrology

## 2011-07-31 ENCOUNTER — Encounter (HOSPITAL_COMMUNITY): Payer: Medicare Other

## 2011-07-31 LAB — POCT HEMOGLOBIN-HEMACUE: Hemoglobin: 10.3 g/dL — ABNORMAL LOW (ref 12.0–15.0)

## 2011-08-07 ENCOUNTER — Encounter (HOSPITAL_COMMUNITY): Payer: Medicare Other

## 2011-08-07 ENCOUNTER — Other Ambulatory Visit: Payer: Self-pay | Admitting: Nephrology

## 2011-08-07 LAB — POCT HEMOGLOBIN-HEMACUE: Hemoglobin: 10.4 g/dL — ABNORMAL LOW (ref 12.0–15.0)

## 2011-08-14 ENCOUNTER — Other Ambulatory Visit: Payer: Self-pay | Admitting: Nephrology

## 2011-08-14 ENCOUNTER — Encounter (HOSPITAL_COMMUNITY): Payer: Medicare Other | Attending: Nephrology

## 2011-08-14 DIAGNOSIS — N183 Chronic kidney disease, stage 3 unspecified: Secondary | ICD-10-CM | POA: Insufficient documentation

## 2011-08-14 DIAGNOSIS — D638 Anemia in other chronic diseases classified elsewhere: Secondary | ICD-10-CM | POA: Insufficient documentation

## 2011-08-14 LAB — POCT HEMOGLOBIN-HEMACUE: Hemoglobin: 10.5 g/dL — ABNORMAL LOW (ref 12.0–15.0)

## 2011-08-15 ENCOUNTER — Telehealth: Payer: Self-pay | Admitting: *Deleted

## 2011-08-15 NOTE — Telephone Encounter (Signed)
Called patient to let her know that her next carotid ultrasound will be in 6 months. Call back request sent to [facility].

## 2011-08-21 ENCOUNTER — Other Ambulatory Visit: Payer: Self-pay | Admitting: Nephrology

## 2011-08-21 ENCOUNTER — Encounter (HOSPITAL_COMMUNITY): Payer: Medicare Other

## 2011-08-21 LAB — BASIC METABOLIC PANEL
CO2: 30 mEq/L (ref 19–32)
Calcium: 10 mg/dL (ref 8.4–10.5)
Creatinine, Ser: 2.37 mg/dL — ABNORMAL HIGH (ref 0.50–1.10)
Glucose, Bld: 98 mg/dL (ref 70–99)

## 2011-08-21 LAB — IRON AND TIBC
Iron: 55 ug/dL (ref 42–135)
TIBC: 285 ug/dL (ref 250–470)

## 2011-08-21 LAB — POCT HEMOGLOBIN-HEMACUE: Hemoglobin: 10.6 g/dL — ABNORMAL LOW (ref 12.0–15.0)

## 2011-08-21 LAB — URIC ACID: Uric Acid, Serum: 5.6 mg/dL (ref 2.4–7.0)

## 2011-08-28 ENCOUNTER — Encounter (HOSPITAL_COMMUNITY): Payer: Medicare Other

## 2011-08-28 ENCOUNTER — Other Ambulatory Visit: Payer: Self-pay | Admitting: Nephrology

## 2011-09-04 ENCOUNTER — Encounter (HOSPITAL_COMMUNITY): Payer: Medicare Other

## 2011-09-04 ENCOUNTER — Other Ambulatory Visit: Payer: Self-pay | Admitting: Nephrology

## 2011-09-04 LAB — POCT HEMOGLOBIN-HEMACUE: Hemoglobin: 10.6 g/dL — ABNORMAL LOW (ref 12.0–15.0)

## 2011-09-06 ENCOUNTER — Other Ambulatory Visit: Payer: Self-pay | Admitting: Internal Medicine

## 2011-09-07 LAB — CROSSMATCH: Antibody Screen: NEGATIVE

## 2011-09-07 LAB — APTT: aPTT: 29

## 2011-09-07 LAB — CBC
HCT: 28.9 — ABNORMAL LOW
Hemoglobin: 9.3 — ABNORMAL LOW
MCHC: 32
MCV: 103.8 — ABNORMAL HIGH
RDW: 17 — ABNORMAL HIGH

## 2011-09-11 ENCOUNTER — Other Ambulatory Visit: Payer: Self-pay | Admitting: Nephrology

## 2011-09-11 ENCOUNTER — Encounter (HOSPITAL_COMMUNITY)
Admission: RE | Admit: 2011-09-11 | Discharge: 2011-09-11 | Disposition: A | Discharge status: Home / Self Care | Payer: Medicare Other | Source: Ambulatory Visit | Attending: Nephrology | Admitting: Nephrology

## 2011-09-11 DIAGNOSIS — D638 Anemia in other chronic diseases classified elsewhere: Secondary | ICD-10-CM | POA: Insufficient documentation

## 2011-09-11 DIAGNOSIS — N183 Chronic kidney disease, stage 3 unspecified: Secondary | ICD-10-CM | POA: Insufficient documentation

## 2011-09-11 LAB — POCT HEMOGLOBIN-HEMACUE: Hemoglobin: 10.5 g/dL — ABNORMAL LOW (ref 12.0–15.0)

## 2011-09-17 ENCOUNTER — Other Ambulatory Visit: Payer: Self-pay | Admitting: Gastroenterology

## 2011-09-18 ENCOUNTER — Other Ambulatory Visit: Payer: Self-pay | Admitting: Nephrology

## 2011-09-18 ENCOUNTER — Encounter (HOSPITAL_COMMUNITY): Payer: Medicare Other

## 2011-09-18 LAB — IRON AND TIBC: Saturation Ratios: 28 % (ref 20–55)

## 2011-10-02 ENCOUNTER — Encounter (HOSPITAL_COMMUNITY): Payer: Medicare Other

## 2011-10-02 ENCOUNTER — Other Ambulatory Visit: Payer: Self-pay | Admitting: Nephrology

## 2011-10-09 ENCOUNTER — Encounter (HOSPITAL_COMMUNITY): Payer: Medicare Other

## 2011-10-09 ENCOUNTER — Other Ambulatory Visit: Payer: Self-pay | Admitting: Nephrology

## 2011-10-09 LAB — POCT HEMOGLOBIN-HEMACUE: Hemoglobin: 10.4 g/dL — ABNORMAL LOW (ref 12.0–15.0)

## 2011-10-10 ENCOUNTER — Telehealth: Payer: Self-pay | Admitting: Internal Medicine

## 2011-10-10 ENCOUNTER — Other Ambulatory Visit: Payer: Self-pay | Admitting: Internal Medicine

## 2011-10-10 NOTE — Telephone Encounter (Signed)
OK X1 

## 2011-10-10 NOTE — Telephone Encounter (Signed)
Tramadol request [last refill 09/06/11 #[id number]]

## 2011-10-10 NOTE — Telephone Encounter (Signed)
Done

## 2011-10-11 ENCOUNTER — Other Ambulatory Visit: Payer: Self-pay

## 2011-10-11 MED ORDER — METOLAZONE 2.5 MG PO TABS: 2.5000 mg | ORAL_TABLET | ORAL | Status: DC | PRN

## 2011-10-11 MED ORDER — TORSEMIDE 20 MG PO TABS: 20.0000 mg | ORAL_TABLET | Freq: Every day | ORAL | Status: DC

## 2011-10-12 NOTE — Telephone Encounter (Signed)
Called pharm. Advised Metalazone is 2.5mg  and Torsemide is 20mg .

## 2011-10-12 NOTE — Telephone Encounter (Signed)
Pharmacy need clarification on medication . Metolazone 2.5 mg torsemide 20 mg. Pt think the dosage is switch.

## 2011-10-15 ENCOUNTER — Other Ambulatory Visit (HOSPITAL_COMMUNITY): Payer: Self-pay | Admitting: *Deleted

## 2011-10-16 ENCOUNTER — Encounter (HOSPITAL_COMMUNITY): Payer: Medicare Other

## 2011-10-16 DIAGNOSIS — D638 Anemia in other chronic diseases classified elsewhere: Secondary | ICD-10-CM | POA: Insufficient documentation

## 2011-10-16 DIAGNOSIS — N183 Chronic kidney disease, stage 3 unspecified: Secondary | ICD-10-CM | POA: Insufficient documentation

## 2011-10-16 LAB — IRON AND TIBC
Iron: 72 ug/dL (ref 42–135)
Saturation Ratios: 28 % (ref 20–55)
UIBC: 181 ug/dL (ref 125–400)

## 2011-10-16 MED ORDER — EPOETIN ALFA 10000 UNIT/ML IJ SOLN
40000.0000 [IU] | INTRAMUSCULAR | Status: AC
  Administered 2011-10-16: 40000 [IU] via SUBCUTANEOUS

## 2011-10-18 MED FILL — Epoetin Alfa Inj 40000 Unit/ML: INTRAMUSCULAR | Qty: 1 | Status: AC

## 2011-10-23 ENCOUNTER — Encounter (HOSPITAL_COMMUNITY)
Admission: RE | Admit: 2011-10-23 | Discharge: 2011-10-23 | Disposition: A | Discharge status: Home / Self Care | Payer: Medicare Other | Source: Ambulatory Visit | Attending: Nephrology | Admitting: Nephrology

## 2011-10-23 MED ORDER — EPOETIN ALFA 10000 UNIT/ML IJ SOLN
40000.0000 [IU] | INTRAMUSCULAR | Status: DC
  Administered 2011-10-23: 40000 [IU] via SUBCUTANEOUS

## 2011-10-23 MED ORDER — EPOETIN ALFA 40000 UNIT/ML IJ SOLN
INTRAMUSCULAR | Status: AC
  Filled 2011-10-23: qty 1

## 2011-10-30 ENCOUNTER — Encounter (HOSPITAL_COMMUNITY)
Admission: RE | Admit: 2011-10-30 | Discharge: 2011-10-30 | Disposition: A | Discharge status: Home / Self Care | Payer: Medicare Other | Source: Ambulatory Visit | Attending: Nephrology | Admitting: Nephrology

## 2011-10-30 LAB — POCT HEMOGLOBIN-HEMACUE: Hemoglobin: 11.9 g/dL — ABNORMAL LOW (ref 12.0–15.0)

## 2011-10-30 MED ORDER — EPOETIN ALFA 10000 UNIT/ML IJ SOLN: 40000.0000 [IU] | INTRAMUSCULAR | Status: DC

## 2011-11-06 ENCOUNTER — Encounter (HOSPITAL_COMMUNITY): Admission: RE | Admit: 2011-11-06 | Payer: Medicare Other | Source: Ambulatory Visit

## 2011-11-10 ENCOUNTER — Other Ambulatory Visit: Payer: Self-pay | Admitting: Internal Medicine

## 2011-11-12 ENCOUNTER — Other Ambulatory Visit: Payer: Self-pay | Admitting: Internal Medicine

## 2011-11-12 NOTE — Telephone Encounter (Signed)
Her last uric acid was 5.6 on 9/11; her most recent creatinine was 2.37. Allopurinol will be contraindicated. I do not recommend Colcrys with such a low uric acid. I recommend office visit prior to refilling these.

## 2011-11-12 NOTE — Telephone Encounter (Signed)
Last OV 11/14/10, med is no longer on patient's medication list please advise

## 2011-11-12 NOTE — Telephone Encounter (Signed)
Labs checked by Dr. Marina Gravel on 08/21/2011

## 2011-11-12 NOTE — Telephone Encounter (Signed)
Labs should be rechecked prior to refilling this medicine; uric acid, BMET (274.9). If kidney function is impaired there could  be increased risk with this medication.

## 2011-11-12 NOTE — Telephone Encounter (Signed)
Per Dr.Hopper patient needs an office visit prior to filling this medication. Left message on voicemail informing patient to call and schedule a follow-up

## 2011-11-13 ENCOUNTER — Encounter (HOSPITAL_COMMUNITY)
Admission: RE | Admit: 2011-11-13 | Discharge: 2011-11-13 | Disposition: A | Discharge status: Home / Self Care | Payer: Medicare Other | Source: Ambulatory Visit | Attending: Nephrology | Admitting: Nephrology

## 2011-11-13 DIAGNOSIS — N183 Chronic kidney disease, stage 3 unspecified: Secondary | ICD-10-CM | POA: Insufficient documentation

## 2011-11-13 DIAGNOSIS — D638 Anemia in other chronic diseases classified elsewhere: Secondary | ICD-10-CM | POA: Insufficient documentation

## 2011-11-13 LAB — IRON AND TIBC
Iron: 100 ug/dL (ref 42–135)
TIBC: 257 ug/dL (ref 250–470)
UIBC: 157 ug/dL (ref 125–400)

## 2011-11-13 MED ORDER — EPOETIN ALFA 10000 UNIT/ML IJ SOLN: 40000.0000 [IU] | INTRAMUSCULAR | Status: DC

## 2011-11-13 MED ORDER — EPOETIN ALFA 40000 UNIT/ML IJ SOLN
INTRAMUSCULAR | Status: AC
  Administered 2011-11-13: 40000 [IU] via SUBCUTANEOUS
  Filled 2011-11-13: qty 1

## 2011-11-15 ENCOUNTER — Other Ambulatory Visit: Payer: Self-pay | Admitting: Internal Medicine

## 2011-11-16 ENCOUNTER — Other Ambulatory Visit: Payer: Self-pay | Admitting: Internal Medicine

## 2011-11-19 ENCOUNTER — Telehealth: Payer: Self-pay | Admitting: Internal Medicine

## 2011-11-19 NOTE — Telephone Encounter (Signed)
We have documented on several refills, Per Dr.Hopper patient needs OV prior to refilling this medication. Please schedule OV

## 2011-11-19 NOTE — Telephone Encounter (Signed)
Patient has gout flare up wants refill allopurinol & colcrys - [facility] [address]

## 2011-11-20 ENCOUNTER — Encounter (HOSPITAL_COMMUNITY)
Admission: RE | Admit: 2011-11-20 | Discharge: 2011-11-20 | Disposition: A | Discharge status: Home / Self Care | Payer: Medicare Other | Source: Ambulatory Visit | Attending: Nephrology | Admitting: Nephrology

## 2011-11-20 MED ORDER — EPOETIN ALFA 10000 UNIT/ML IJ SOLN: 40000.0000 [IU] | INTRAMUSCULAR | Status: DC

## 2011-11-20 MED ORDER — EPOETIN ALFA 40000 UNIT/ML IJ SOLN
INTRAMUSCULAR | Status: AC
  Administered 2011-11-20: 10:00:00 via SUBCUTANEOUS
  Filled 2011-11-20: qty 1

## 2011-11-21 ENCOUNTER — Encounter: Payer: Self-pay | Admitting: Internal Medicine

## 2011-11-21 NOTE — Telephone Encounter (Signed)
lmom to schedule appt

## 2011-11-23 ENCOUNTER — Encounter: Payer: Self-pay | Admitting: Internal Medicine

## 2011-11-23 ENCOUNTER — Ambulatory Visit (INDEPENDENT_AMBULATORY_CARE_PROVIDER_SITE_OTHER): Payer: Medicare Other | Admitting: Internal Medicine

## 2011-11-23 DIAGNOSIS — M109 Gout, unspecified: Secondary | ICD-10-CM

## 2011-11-23 DIAGNOSIS — M2550 Pain in unspecified joint: Secondary | ICD-10-CM

## 2011-11-23 DIAGNOSIS — N19 Unspecified kidney failure: Secondary | ICD-10-CM

## 2011-11-23 NOTE — Assessment & Plan Note (Signed)
She is followed by Dr. Caryn Section for her adrenal insufficiency. She is not on dialysis. The allopurinol potentially could aggravate renal function.

## 2011-11-23 NOTE — Assessment & Plan Note (Signed)
She states that she was diagnosed with gout in the emergency room. She has seen Dr. Dierdre Forth, a rheumatologist. He has given her prednisone and has also injected steroids in her shoulder.  The safest and most appropriate approach would be to hold the allopurinol because of her renal insufficiency, and recheck uric acid after being off this medication for 4-6 weeks. She should also see her rheumatologist to clearly define optimal therapy for gout, if present, in the context of her renal insufficiency. I have expressed my concerns about the negative impact on her kidney function from allopurinol.

## 2011-11-23 NOTE — Patient Instructions (Signed)
Stop allopurinol until you see Dr. Dierdre Forth until  the uric acid can be rechecked off this agent. He uses the lotion twice a day to the dry skin to prevent secondary infection.

## 2011-11-23 NOTE — Progress Notes (Signed)
  Subjective:    Patient ID: [name], female    DOB: 13-Sep-1929, 77 y.o.   MRN: [id number]  HPI she is here to discuss the allopurinol she's been taking for hyperuricemia and gout prevention.  At this time she describes "weakness" in her feet but no pain. She apparently has had some erythema and tenderness at the right medial malleolar area which is resolved.  Apparently he was originally diagnosed in the emergency room. She is unsure as to whether her rheumatologist feels is an active issue.  Problematic is her renal insufficiency and the possible negative impact of allopurinol.  She had pain in her feet from her ankles done last week which resolved with oral prednisone.    Review of Systems     Objective:   Physical Exam  She appears well-nourished and: She is in wheelchair and onto no oxygen.  She has no edema clubbing or cyanosis.  Stasis hyperpigmentation and marked dryness noted of the ankle areas. Pedal pulses are present  She has no evidence of active podagra at this time. Fingers reveal no significant arthritic changes. She has crepitus and marked discomfort with range of motion testing of the shoulders        Assessment & Plan:  #1 "weakness" in her feet ;questionable PMH of  gout; this should be defined by rheumatology reevaluation.  #2 possible recent cellulitis related to marked drying. Aveeno Daily Moisturizing Lotion twice a day would be recommended

## 2011-11-27 ENCOUNTER — Encounter (HOSPITAL_COMMUNITY)
Admission: RE | Admit: 2011-11-27 | Discharge: 2011-11-27 | Disposition: A | Discharge status: Home / Self Care | Payer: Medicare Other | Source: Ambulatory Visit | Attending: Nephrology | Admitting: Nephrology

## 2011-11-27 MED ORDER — EPOETIN ALFA 10000 UNIT/ML IJ SOLN: 40000.0000 [IU] | INTRAMUSCULAR | Status: DC

## 2011-11-27 MED ORDER — EPOETIN ALFA 40000 UNIT/ML IJ SOLN
INTRAMUSCULAR | Status: AC
  Administered 2011-11-27: 40000 [IU]
  Filled 2011-11-27: qty 1

## 2011-12-05 ENCOUNTER — Other Ambulatory Visit (HOSPITAL_COMMUNITY): Payer: Self-pay | Admitting: *Deleted

## 2011-12-06 ENCOUNTER — Encounter: Payer: Self-pay | Admitting: Internal Medicine

## 2011-12-06 ENCOUNTER — Ambulatory Visit (INDEPENDENT_AMBULATORY_CARE_PROVIDER_SITE_OTHER): Payer: Medicare Other | Admitting: Internal Medicine

## 2011-12-06 VITALS — BP 120/62 | HR 64 | Ht <= 58 in | Wt 149.0 lb

## 2011-12-06 DIAGNOSIS — E785 Hyperlipidemia, unspecified: Secondary | ICD-10-CM

## 2011-12-06 DIAGNOSIS — I509 Heart failure, unspecified: Secondary | ICD-10-CM

## 2011-12-06 DIAGNOSIS — I10 Essential (primary) hypertension: Secondary | ICD-10-CM

## 2011-12-06 DIAGNOSIS — I4891 Unspecified atrial fibrillation: Secondary | ICD-10-CM

## 2011-12-06 NOTE — Progress Notes (Addendum)
HPI Patient is an 76 year old with a history of restrictive cardiomyopathy, HTN, dyslipidemia and renal insufficiency.  I saw her in June. Since I saw her her breathing and fluid have been very good.  No CP.   Does complain of achiness in arms.   Also had complaints of LE pain  Question gout.  Put on prednisone.  Due to be seen by Dr. Dierdre Forth soon No Known Allergies  Current Outpatient Prescriptions  Medication Sig Dispense Refill  . acetaminophen (TYLENOL) 325 MG tablet Take 650 mg by mouth every 4 (four) hours as needed.        Marland Kitchen epoetin alfa (PROCRIT) 16109 UNIT/ML injection Inject into the skin once a week. Take 1 injection every 2 weeks.      . hydrocortisone (ANUSOL-HC) 25 MG suppository Place 25 mg rectally at bedtime. Use for 10 days.       Marland Kitchen levalbuterol (XOPENEX HFA) 45 MCG/ACT inhaler Inhale 1-2 puffs into the lungs 3 (three) times daily.        Marland Kitchen levothyroxine (SYNTHROID, LEVOTHROID) 75 MCG tablet TAKE 1 TABLET BY MOUTH EVERY DAY  90 tablet  1  . losartan (COZAAR) 100 MG tablet Take 50 mg by mouth daily.       . metolazone (ZAROXOLYN) 2.5 MG tablet Take 1 tablet (2.5 mg total) by mouth every other day as needed.  210 tablet  5  . metoprolol (TOPROL-XL) 50 MG 24 hr tablet Take 0.5 tablets by mouth daily.       . NON FORMULARY OXYGEN 2 LITERS       . pantoprazole (PROTONIX) 40 MG tablet TAKE 1 TABLET BY MOUTH TWICE DAILY WITH MEALS  60 tablet  4  . potassium chloride 40 MEQ/15ML (20%) LIQD Take 15 mLs by mouth 2 (two) times daily.       . predniSONE (DELTASONE) 5 MG tablet Take 5 mg by mouth daily.        . simvastatin (ZOCOR) 20 MG tablet TAKE 1 TABLET BY MOUTH EVERY NIGHT AT BEDTIME  90 tablet  2  . torsemide (DEMADEX) 20 MG tablet Take 1 tablet (20 mg total) by mouth daily.  30 tablet  6  . traMADol (ULTRAM) 50 MG tablet TAKE  1/2-1 TABLET BY MOUTH EVERY 8-12 HOURS AS NEEDED  60 tablet  1  . vitamin C (ASCORBIC ACID) 500 MG tablet Take 500 mg by mouth daily.        Marland Kitchen allopurinol  (ZYLOPRIM) 100 MG tablet TAKE 2 TABLETS BY MOUTH EVERY DAY  60 tablet  5    Past Medical History  Diagnosis Date  . Restrictive cardiomyopathy   . Atrial fibrillation 1996    S/P cardioversion  . CVA (cerebral infarction) 1999  . COAD (chronic obstructive airways disease) 2007    Exacerbation  . Sleep apnea   . CPAP (continuous positive airway pressure) dependence   . Anemia     NOS  . Iron deficiency   . Gout   . Peptic ulcer, acute with hemorrhage 2005, 2011  . Hypertension   . Hypothyroidism   . Diastolic dysfunction   . Adenomatous colon polyp   . GERD (gastroesophageal reflux disease)   . Hiatal hernia   . Diverticulosis   . Anxiety disorder   . Arthritis   . Internal and external hemorrhoids without complication   . COPD (chronic obstructive pulmonary disease)   . Morbid obesity   . CAD (coronary artery disease) 08/06/2008    One  vessel by cath, EF overall preserved by echo 04/30/2008  . On home oxygen therapy   . Renal failure     Dr. Caryn Section  . Hyperlipidemia     Past Surgical History  Procedure Date  . Appendectomy   . Oophorectomy   . Incision and drainage of cellulitis 04/2008    Umbilical abcess  . Cardiac catheterization     Pulmonary HTN, non obstructive CAD  . Abdominal hysterectomy     Family History  Problem Relation Age of Onset  . Breast cancer Mother   . Cancer Mother     breast  . Heart disease Father     MI  . Breast cancer Sister   . Cancer Sister     breast  . Heart disease Brother     CABG 5 vessel  . Rheum arthritis Brother   . Colon cancer Neg Hx     History   Social History  . Marital Status: Married    Spouse Name: N/A    Number of Children: N/A  . Years of Education: N/A   Occupational History  . Retired Social worker   Social History Main Topics  . Smoking status: Never Smoker   . Smokeless tobacco: Not on file  . Alcohol Use: No  . Drug Use: Not on file  . Sexually Active: Not on file   Other Topics  Concern  . Not on file   Social History Narrative   MarriedOne childNo regular exerciseOxygen dependent Daily caffeine use: one daily07/19/2011 - Designated party form signed appointing spouse Samya Siciliano or daughter Precious Haws; OK to leave message on home answering machine at [phone]; [name] [phone]    Review of Systems:  All systems reviewed.  They are negative to the above problem except as previously stated.  Vital Signs: BP 120/62  Pulse 64  Ht 4\' 9"  (1.448 m)  Wt 149 lb (67.586 kg)  BMI 32.24 kg/m2  Physical Exam Patient is in NAD  HEENT:  Normocephalic, atraumatic. EOMI, PERRLA.  Neck: JVP is normal. No thyromegaly. No bruits.  Lungs: clear to auscultation. No rales no wheezes.  Heart: Regular rate and rhythm. Normal S1, S2. No S3.   No significant murmurs. PMI not displaced.  Abdomen:  Obese.  Supple, nontender. Normal bowel sounds. No masses. No hepatomegaly.  Extremities:   Good distal pulses throughout. No lower extremity edema.  Musculoskeletal :moving all extremities.  Neuro:   alert and oriented x3.  CN II-XII grossly intact.  EKG:  Atrial fibrillation  64 bpm.  RBBB.  Septal MI.  T wave inversion V3 to V6; I, II, AVF.   Assessment and Plan:

## 2011-12-06 NOTE — Assessment & Plan Note (Signed)
Remains rate controlled.  She is off coumadin.  With her history of GI bleeds that were severe I do not think she is a coumadin candidate.  Keep on same regimen.  She understands risks

## 2011-12-06 NOTE — Assessment & Plan Note (Signed)
Patient with a restrictive cardiomyopath.  Volume status looks very good.  I would keep on same regimen.

## 2011-12-06 NOTE — Assessment & Plan Note (Signed)
Patient complains of some achiness.  I told her to  Stop simistatin for a few wks.  If improves call.  If no change would resume.

## 2011-12-06 NOTE — Assessment & Plan Note (Signed)
Good control

## 2011-12-06 NOTE — Patient Instructions (Signed)
Your physician wants you to follow-up in:August 2013 You will receive a reminder letter in the mail two months in advance. If you don't receive a letter, please call our office to schedule the follow-up appointment.

## 2011-12-07 ENCOUNTER — Encounter (HOSPITAL_COMMUNITY)
Admission: RE | Admit: 2011-12-07 | Discharge: 2011-12-07 | Disposition: A | Discharge status: Home / Self Care | Payer: Medicare Other | Source: Ambulatory Visit | Attending: Nephrology | Admitting: Nephrology

## 2011-12-07 MED ORDER — EPOETIN ALFA 10000 UNIT/ML IJ SOLN: 40000.0000 [IU] | INTRAMUSCULAR | Status: DC

## 2011-12-12 ENCOUNTER — Other Ambulatory Visit: Payer: Self-pay | Admitting: Internal Medicine

## 2011-12-24 ENCOUNTER — Other Ambulatory Visit (HOSPITAL_COMMUNITY): Payer: Self-pay | Admitting: *Deleted

## 2011-12-25 ENCOUNTER — Encounter (HOSPITAL_COMMUNITY)
Admission: RE | Admit: 2011-12-25 | Discharge: 2011-12-25 | Disposition: A | Discharge status: Home / Self Care | Payer: Medicare Other | Source: Ambulatory Visit | Attending: Nephrology | Admitting: Nephrology

## 2011-12-25 DIAGNOSIS — N183 Chronic kidney disease, stage 3 unspecified: Secondary | ICD-10-CM | POA: Insufficient documentation

## 2011-12-25 DIAGNOSIS — D638 Anemia in other chronic diseases classified elsewhere: Secondary | ICD-10-CM | POA: Insufficient documentation

## 2011-12-25 LAB — FERRITIN: Ferritin: 440 ng/mL — ABNORMAL HIGH (ref 10–291)

## 2011-12-25 LAB — IRON AND TIBC: Iron: 77 ug/dL (ref 42–135)

## 2011-12-25 LAB — POCT HEMOGLOBIN-HEMACUE: Hemoglobin: 9.4 g/dL — ABNORMAL LOW (ref 12.0–15.0)

## 2011-12-25 MED ORDER — EPOETIN ALFA 10000 UNIT/ML IJ SOLN: 40000.0000 [IU] | INTRAMUSCULAR | Status: DC

## 2011-12-25 MED ORDER — EPOETIN ALFA 40000 UNIT/ML IJ SOLN
INTRAMUSCULAR | Status: AC
  Administered 2011-12-25: 40000 [IU] via SUBCUTANEOUS
  Filled 2011-12-25: qty 1

## 2011-12-28 ENCOUNTER — Encounter (HOSPITAL_COMMUNITY): Payer: Medicare Other

## 2012-01-01 ENCOUNTER — Encounter (HOSPITAL_COMMUNITY)
Admission: RE | Admit: 2012-01-01 | Discharge: 2012-01-01 | Disposition: A | Discharge status: Home / Self Care | Payer: Medicare Other | Source: Ambulatory Visit | Attending: Nephrology | Admitting: Nephrology

## 2012-01-01 MED ORDER — EPOETIN ALFA 10000 UNIT/ML IJ SOLN: 40000.0000 [IU] | INTRAMUSCULAR | Status: DC

## 2012-01-01 MED ORDER — EPOETIN ALFA 40000 UNIT/ML IJ SOLN
INTRAMUSCULAR | Status: AC
  Administered 2012-01-01: 40000 [IU] via SUBCUTANEOUS
  Filled 2012-01-01: qty 1

## 2012-01-04 ENCOUNTER — Encounter (HOSPITAL_COMMUNITY): Payer: Medicare Other

## 2012-01-08 ENCOUNTER — Encounter (HOSPITAL_COMMUNITY)
Admission: RE | Admit: 2012-01-08 | Discharge: 2012-01-08 | Disposition: A | Discharge status: Home / Self Care | Payer: Medicare Other | Source: Ambulatory Visit | Attending: Nephrology | Admitting: Nephrology

## 2012-01-08 MED ORDER — EPOETIN ALFA 10000 UNIT/ML IJ SOLN: 40000.0000 [IU] | INTRAMUSCULAR | Status: DC

## 2012-01-08 MED ORDER — EPOETIN ALFA 40000 UNIT/ML IJ SOLN
INTRAMUSCULAR | Status: AC
  Administered 2012-01-08: 40000 [IU] via SUBCUTANEOUS
  Filled 2012-01-08: qty 1

## 2012-01-11 ENCOUNTER — Encounter (HOSPITAL_COMMUNITY): Payer: Medicare Other

## 2012-01-15 ENCOUNTER — Encounter (HOSPITAL_COMMUNITY)
Admission: RE | Admit: 2012-01-15 | Discharge: 2012-01-15 | Disposition: A | Discharge status: Home / Self Care | Payer: Medicare Other | Source: Ambulatory Visit | Attending: Nephrology | Admitting: Nephrology

## 2012-01-15 DIAGNOSIS — D638 Anemia in other chronic diseases classified elsewhere: Secondary | ICD-10-CM | POA: Insufficient documentation

## 2012-01-15 DIAGNOSIS — N183 Chronic kidney disease, stage 3 unspecified: Secondary | ICD-10-CM | POA: Insufficient documentation

## 2012-01-15 LAB — POCT HEMOGLOBIN-HEMACUE: Hemoglobin: 11.3 g/dL — ABNORMAL LOW (ref 12.0–15.0)

## 2012-01-15 MED ORDER — EPOETIN ALFA 10000 UNIT/ML IJ SOLN: 40000.0000 [IU] | INTRAMUSCULAR | Status: DC

## 2012-01-18 ENCOUNTER — Other Ambulatory Visit: Payer: Self-pay | Admitting: Cardiology

## 2012-01-18 ENCOUNTER — Encounter (HOSPITAL_COMMUNITY): Payer: Medicare Other

## 2012-01-18 DIAGNOSIS — I6529 Occlusion and stenosis of unspecified carotid artery: Secondary | ICD-10-CM

## 2012-01-21 ENCOUNTER — Encounter (HOSPITAL_COMMUNITY): Payer: Medicare Other

## 2012-01-22 ENCOUNTER — Encounter (HOSPITAL_COMMUNITY): Payer: Medicare Other

## 2012-01-22 ENCOUNTER — Encounter (INDEPENDENT_AMBULATORY_CARE_PROVIDER_SITE_OTHER): Payer: Medicare Other | Admitting: *Deleted

## 2012-01-22 DIAGNOSIS — I6529 Occlusion and stenosis of unspecified carotid artery: Secondary | ICD-10-CM

## 2012-01-25 ENCOUNTER — Encounter (HOSPITAL_COMMUNITY): Payer: Medicare Other

## 2012-01-29 ENCOUNTER — Encounter (HOSPITAL_COMMUNITY)
Admission: RE | Admit: 2012-01-29 | Discharge: 2012-01-29 | Disposition: A | Discharge status: Home / Self Care | Payer: Medicare Other | Source: Ambulatory Visit | Attending: Nephrology | Admitting: Nephrology

## 2012-01-29 LAB — IRON AND TIBC
Iron: 89 ug/dL (ref 42–135)
Saturation Ratios: 30 % (ref 20–55)
UIBC: 204 ug/dL (ref 125–400)

## 2012-01-29 LAB — POCT HEMOGLOBIN-HEMACUE: Hemoglobin: 10.8 g/dL — ABNORMAL LOW (ref 12.0–15.0)

## 2012-01-29 MED ORDER — EPOETIN ALFA 10000 UNIT/ML IJ SOLN: 40000.0000 [IU] | INTRAMUSCULAR | Status: DC

## 2012-01-29 MED ORDER — EPOETIN ALFA 40000 UNIT/ML IJ SOLN
INTRAMUSCULAR | Status: AC
  Administered 2012-01-29: 40000 [IU] via SUBCUTANEOUS
  Filled 2012-01-29: qty 1

## 2012-02-01 ENCOUNTER — Encounter (HOSPITAL_COMMUNITY): Payer: Medicare Other

## 2012-02-05 ENCOUNTER — Encounter (HOSPITAL_COMMUNITY)
Admission: RE | Admit: 2012-02-05 | Discharge: 2012-02-05 | Disposition: A | Discharge status: Home / Self Care | Payer: Medicare Other | Source: Ambulatory Visit | Attending: Nephrology | Admitting: Nephrology

## 2012-02-05 LAB — POCT HEMOGLOBIN-HEMACUE: Hemoglobin: 10.9 g/dL — ABNORMAL LOW (ref 12.0–15.0)

## 2012-02-05 MED ORDER — EPOETIN ALFA 10000 UNIT/ML IJ SOLN: 40000.0000 [IU] | INTRAMUSCULAR | Status: DC

## 2012-02-05 MED ORDER — EPOETIN ALFA 40000 UNIT/ML IJ SOLN
INTRAMUSCULAR | Status: AC
  Administered 2012-02-05: 40000 [IU] via SUBCUTANEOUS
  Filled 2012-02-05: qty 1

## 2012-02-06 ENCOUNTER — Other Ambulatory Visit: Payer: Self-pay | Admitting: Internal Medicine

## 2012-02-06 NOTE — Telephone Encounter (Signed)
Prescription sent to pharmacy.

## 2012-02-06 NOTE — Telephone Encounter (Signed)
OK X1 

## 2012-02-06 NOTE — Telephone Encounter (Signed)
Okay to refill? 

## 2012-02-08 ENCOUNTER — Encounter (HOSPITAL_COMMUNITY): Payer: Medicare Other

## 2012-02-11 ENCOUNTER — Other Ambulatory Visit (HOSPITAL_COMMUNITY): Payer: Self-pay | Admitting: *Deleted

## 2012-02-12 ENCOUNTER — Encounter (HOSPITAL_COMMUNITY)
Admission: RE | Admit: 2012-02-12 | Discharge: 2012-02-12 | Disposition: A | Discharge status: Home / Self Care | Payer: Medicare Other | Source: Ambulatory Visit | Attending: Nephrology | Admitting: Nephrology

## 2012-02-12 DIAGNOSIS — N183 Chronic kidney disease, stage 3 unspecified: Secondary | ICD-10-CM | POA: Insufficient documentation

## 2012-02-12 DIAGNOSIS — D638 Anemia in other chronic diseases classified elsewhere: Secondary | ICD-10-CM | POA: Insufficient documentation

## 2012-02-12 LAB — POCT HEMOGLOBIN-HEMACUE: Hemoglobin: 10.5 g/dL — ABNORMAL LOW (ref 12.0–15.0)

## 2012-02-12 MED ORDER — EPOETIN ALFA 40000 UNIT/ML IJ SOLN
INTRAMUSCULAR | Status: AC
  Administered 2012-02-12: 40000 [IU] via SUBCUTANEOUS
  Filled 2012-02-12: qty 1

## 2012-02-12 MED ORDER — EPOETIN ALFA 10000 UNIT/ML IJ SOLN: 40000.0000 [IU] | INTRAMUSCULAR | Status: DC

## 2012-02-15 ENCOUNTER — Encounter (HOSPITAL_COMMUNITY): Payer: Medicare Other

## 2012-02-16 ENCOUNTER — Other Ambulatory Visit: Payer: Self-pay | Admitting: Gastroenterology

## 2012-02-18 NOTE — Telephone Encounter (Signed)
NEEDS OFFICE VISIT FOR ANY FURTHER REFILLS! 

## 2012-02-19 ENCOUNTER — Encounter (HOSPITAL_COMMUNITY)
Admission: RE | Admit: 2012-02-19 | Discharge: 2012-02-19 | Disposition: A | Discharge status: Home / Self Care | Payer: Medicare Other | Source: Ambulatory Visit | Attending: Nephrology | Admitting: Nephrology

## 2012-02-19 LAB — POCT HEMOGLOBIN-HEMACUE: Hemoglobin: 10.6 g/dL — ABNORMAL LOW (ref 12.0–15.0)

## 2012-02-19 MED ORDER — EPOETIN ALFA 10000 UNIT/ML IJ SOLN: 40000.0000 [IU] | INTRAMUSCULAR | Status: DC

## 2012-02-19 MED ORDER — EPOETIN ALFA 40000 UNIT/ML IJ SOLN
INTRAMUSCULAR | Status: AC
  Administered 2012-02-19: 40000 [IU] via SUBCUTANEOUS
  Filled 2012-02-19: qty 1

## 2012-02-22 ENCOUNTER — Other Ambulatory Visit (HOSPITAL_COMMUNITY): Payer: Self-pay | Admitting: *Deleted

## 2012-02-22 ENCOUNTER — Encounter (HOSPITAL_COMMUNITY): Payer: Medicare Other

## 2012-02-25 ENCOUNTER — Other Ambulatory Visit (HOSPITAL_COMMUNITY): Payer: Self-pay | Admitting: *Deleted

## 2012-02-26 ENCOUNTER — Encounter (HOSPITAL_COMMUNITY)
Admission: RE | Admit: 2012-02-26 | Discharge: 2012-02-26 | Disposition: A | Discharge status: Home / Self Care | Payer: Medicare Other | Source: Ambulatory Visit | Attending: Nephrology | Admitting: Nephrology

## 2012-02-26 MED ORDER — EPOETIN ALFA 10000 UNIT/ML IJ SOLN: 40000.0000 [IU] | INTRAMUSCULAR | Status: DC

## 2012-03-03 IMAGING — CR DG CHEST 1V PORT
1 series · 1 of 1 positions shown · non-contrast
Comparison: The chest x-ray 01/07/2008.

CLINICAL DATA: Shortness of breath and weakness.

PORTABLE CHEST - 1 VIEW

[series 1]
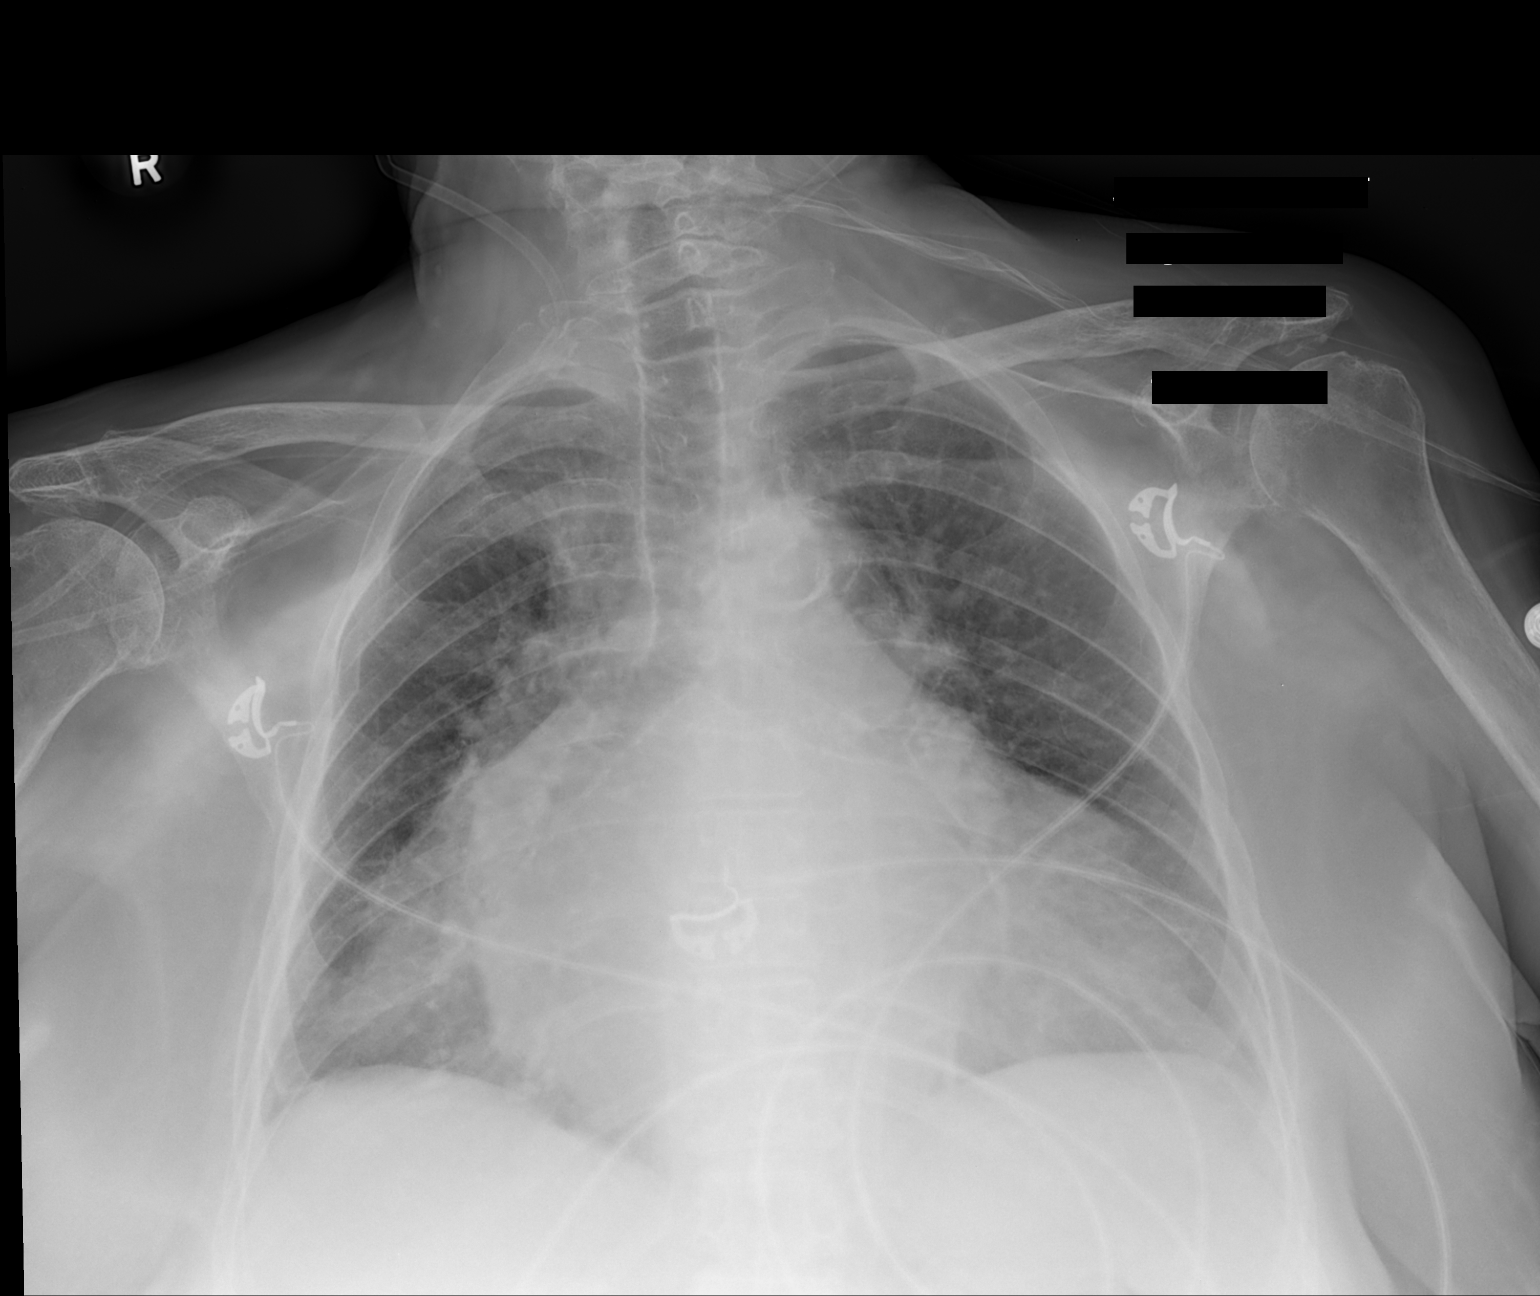

[1 of 1 positions shown; findings below may reference images not displayed]

FINDINGS: The heart is enlarged but stable.  There is tortuosity
and calcification of the thoracic aorta.  Chronic-appearing
bronchitic type lung changes and vascular congestion without overt
pulmonary edema, focal pulmonary filtrate or pleural effusion.  The
bony thorax is intact.
IMPRESSION: 1.  Stable cardiac enlargement.
2.  Chronic lung changes without definite acute overlying pulmonary
process.
3.  Central vascular congestion.

## 2012-03-04 ENCOUNTER — Encounter (HOSPITAL_COMMUNITY): Admission: RE | Admit: 2012-03-04 | Payer: Medicare Other | Source: Ambulatory Visit

## 2012-03-10 ENCOUNTER — Other Ambulatory Visit: Payer: Self-pay | Admitting: Internal Medicine

## 2012-03-10 NOTE — Telephone Encounter (Signed)
Last Ov 11-23-11, last filled 02-06-12 #60

## 2012-03-10 NOTE — Telephone Encounter (Signed)
OK # 30. Please see me before refilling meds next time.

## 2012-03-10 NOTE — Telephone Encounter (Signed)
Rx sent 

## 2012-03-11 ENCOUNTER — Encounter (HOSPITAL_COMMUNITY)
Admission: RE | Admit: 2012-03-11 | Discharge: 2012-03-11 | Disposition: A | Discharge status: Home / Self Care | Payer: Medicare Other | Source: Ambulatory Visit | Attending: Nephrology | Admitting: Nephrology

## 2012-03-11 ENCOUNTER — Encounter (HOSPITAL_COMMUNITY): Payer: Medicare Other

## 2012-03-11 DIAGNOSIS — D638 Anemia in other chronic diseases classified elsewhere: Secondary | ICD-10-CM | POA: Insufficient documentation

## 2012-03-11 DIAGNOSIS — N183 Chronic kidney disease, stage 3 unspecified: Secondary | ICD-10-CM | POA: Insufficient documentation

## 2012-03-11 MED ORDER — EPOETIN ALFA 10000 UNIT/ML IJ SOLN: 40000.0000 [IU] | INTRAMUSCULAR | Status: DC

## 2012-03-18 ENCOUNTER — Encounter (HOSPITAL_COMMUNITY): Payer: Medicare Other

## 2012-03-19 ENCOUNTER — Other Ambulatory Visit: Payer: Self-pay | Admitting: Gastroenterology

## 2012-03-25 ENCOUNTER — Encounter (HOSPITAL_COMMUNITY)
Admission: RE | Admit: 2012-03-25 | Discharge: 2012-03-25 | Disposition: A | Discharge status: Home / Self Care | Payer: Medicare Other | Source: Ambulatory Visit | Attending: Nephrology | Admitting: Nephrology

## 2012-03-25 LAB — POCT HEMOGLOBIN-HEMACUE: Hemoglobin: 11.3 g/dL — ABNORMAL LOW (ref 12.0–15.0)

## 2012-03-25 MED ORDER — EPOETIN ALFA 10000 UNIT/ML IJ SOLN: 40000.0000 [IU] | INTRAMUSCULAR | Status: DC

## 2012-04-01 ENCOUNTER — Encounter (HOSPITAL_COMMUNITY): Payer: Medicare Other

## 2012-04-07 ENCOUNTER — Other Ambulatory Visit: Payer: Self-pay | Admitting: Internal Medicine

## 2012-04-08 ENCOUNTER — Encounter (HOSPITAL_COMMUNITY)
Admission: RE | Admit: 2012-04-08 | Discharge: 2012-04-08 | Disposition: A | Discharge status: Home / Self Care | Payer: Medicare Other | Source: Ambulatory Visit | Attending: Nephrology | Admitting: Nephrology

## 2012-04-08 LAB — RENAL FUNCTION PANEL
CO2: 28 mEq/L (ref 19–32)
Calcium: 9.2 mg/dL (ref 8.4–10.5)
Chloride: 91 mEq/L — ABNORMAL LOW (ref 96–112)
Creatinine, Ser: 2.85 mg/dL — ABNORMAL HIGH (ref 0.50–1.10)
GFR calc Af Amer: 17 mL/min — ABNORMAL LOW (ref >90)
GFR calc non Af Amer: 14 mL/min — ABNORMAL LOW (ref >90)
Glucose, Bld: 107 mg/dL — ABNORMAL HIGH (ref 70–99)
Sodium: 136 mEq/L (ref 135–145)

## 2012-04-08 LAB — URIC ACID: Uric Acid, Serum: 14.6 mg/dL — ABNORMAL HIGH (ref 2.4–7.0)

## 2012-04-08 LAB — POCT HEMOGLOBIN-HEMACUE: Hemoglobin: 8.9 g/dL — ABNORMAL LOW (ref 12.0–15.0)

## 2012-04-08 MED ORDER — EPOETIN ALFA 10000 UNIT/ML IJ SOLN: 40000.0000 [IU] | INTRAMUSCULAR | Status: DC

## 2012-04-08 MED ORDER — EPOETIN ALFA 40000 UNIT/ML IJ SOLN
INTRAMUSCULAR | Status: AC
  Administered 2012-04-08: 40000 [IU] via SUBCUTANEOUS
  Filled 2012-04-08: qty 1

## 2012-04-08 NOTE — Telephone Encounter (Signed)
OK X1 

## 2012-04-08 NOTE — Telephone Encounter (Signed)
Note made on last refill 4/1 13 for patient to see you prior to next refill, no pending appointment. Please advise on request

## 2012-04-09 LAB — PTH, INTACT AND CALCIUM
Calcium, Total (PTH): 9.1 mg/dL (ref 8.4–10.5)
PTH: 220.1 pg/mL — ABNORMAL HIGH (ref 14.0–72.0)

## 2012-04-09 NOTE — Telephone Encounter (Signed)
RX sent

## 2012-04-15 ENCOUNTER — Encounter (HOSPITAL_COMMUNITY)
Admission: RE | Admit: 2012-04-15 | Discharge: 2012-04-15 | Disposition: A | Discharge status: Home / Self Care | Payer: Medicare Other | Source: Ambulatory Visit | Attending: Nephrology | Admitting: Nephrology

## 2012-04-15 DIAGNOSIS — D638 Anemia in other chronic diseases classified elsewhere: Secondary | ICD-10-CM | POA: Insufficient documentation

## 2012-04-15 DIAGNOSIS — N183 Chronic kidney disease, stage 3 unspecified: Secondary | ICD-10-CM | POA: Insufficient documentation

## 2012-04-15 MED ORDER — EPOETIN ALFA 40000 UNIT/ML IJ SOLN
INTRAMUSCULAR | Status: AC
  Administered 2012-04-15: 40000 [IU] via SUBCUTANEOUS
  Filled 2012-04-15: qty 1

## 2012-04-15 MED ORDER — EPOETIN ALFA 10000 UNIT/ML IJ SOLN: 40000.0000 [IU] | INTRAMUSCULAR | Status: DC

## 2012-04-21 ENCOUNTER — Encounter (HOSPITAL_COMMUNITY)
Admission: RE | Admit: 2012-04-21 | Discharge: 2012-04-21 | Disposition: A | Discharge status: Home / Self Care | Payer: Medicare Other | Source: Ambulatory Visit | Attending: Nephrology | Admitting: Nephrology

## 2012-04-21 LAB — IRON AND TIBC
Iron: 73 ug/dL (ref 42–135)
Saturation Ratios: 25 % (ref 20–55)
TIBC: 293 ug/dL (ref 250–470)

## 2012-04-21 MED ORDER — EPOETIN ALFA 40000 UNIT/ML IJ SOLN
INTRAMUSCULAR | Status: AC
  Administered 2012-04-21: 40000 [IU] via SUBCUTANEOUS
  Filled 2012-04-21: qty 1

## 2012-04-21 MED ORDER — EPOETIN ALFA 10000 UNIT/ML IJ SOLN: 40000.0000 [IU] | INTRAMUSCULAR | Status: DC

## 2012-04-22 ENCOUNTER — Other Ambulatory Visit (HOSPITAL_COMMUNITY): Payer: Self-pay | Admitting: *Deleted

## 2012-04-28 ENCOUNTER — Encounter (HOSPITAL_COMMUNITY)
Admission: RE | Admit: 2012-04-28 | Discharge: 2012-04-28 | Disposition: A | Discharge status: Home / Self Care | Payer: Medicare Other | Source: Ambulatory Visit | Attending: Nephrology | Admitting: Nephrology

## 2012-04-28 LAB — BASIC METABOLIC PANEL
CO2: 27 mEq/L (ref 19–32)
Calcium: 9.3 mg/dL (ref 8.4–10.5)
Creatinine, Ser: 2.12 mg/dL — ABNORMAL HIGH (ref 0.50–1.10)
GFR calc non Af Amer: 21 mL/min — ABNORMAL LOW (ref >90)
Glucose, Bld: 72 mg/dL (ref 70–99)
Sodium: 141 mEq/L (ref 135–145)

## 2012-04-28 MED ORDER — EPOETIN ALFA 10000 UNIT/ML IJ SOLN
40000.0000 [IU] | INTRAMUSCULAR | Status: DC
  Administered 2012-04-28: 40000 [IU] via SUBCUTANEOUS

## 2012-04-28 MED ORDER — EPOETIN ALFA 40000 UNIT/ML IJ SOLN
INTRAMUSCULAR | Status: AC
  Filled 2012-04-28: qty 1

## 2012-05-06 ENCOUNTER — Other Ambulatory Visit (HOSPITAL_COMMUNITY): Payer: Self-pay | Admitting: *Deleted

## 2012-05-08 ENCOUNTER — Encounter (HOSPITAL_COMMUNITY): Payer: Medicare Other

## 2012-05-08 ENCOUNTER — Encounter (HOSPITAL_COMMUNITY)
Admission: RE | Admit: 2012-05-08 | Discharge: 2012-05-08 | Disposition: A | Discharge status: Home / Self Care | Payer: Medicare Other | Source: Ambulatory Visit | Attending: Nephrology | Admitting: Nephrology

## 2012-05-08 ENCOUNTER — Other Ambulatory Visit: Payer: Self-pay

## 2012-05-08 DIAGNOSIS — N184 Chronic kidney disease, stage 4 (severe): Secondary | ICD-10-CM

## 2012-05-08 DIAGNOSIS — Z01810 Encounter for preprocedural cardiovascular examination: Secondary | ICD-10-CM

## 2012-05-08 MED ORDER — SODIUM CHLORIDE 0.9 % IV SOLN
INTRAVENOUS | Status: DC
  Administered 2012-05-08: 11:00:00 via INTRAVENOUS

## 2012-05-08 MED ORDER — FERUMOXYTOL INJECTION 510 MG/17 ML
510.0000 mg | INTRAVENOUS | Status: DC
  Administered 2012-05-08: 510 mg via INTRAVENOUS
  Filled 2012-05-08: qty 17

## 2012-05-09 ENCOUNTER — Other Ambulatory Visit: Payer: Self-pay | Admitting: Internal Medicine

## 2012-05-13 ENCOUNTER — Encounter: Payer: Self-pay | Admitting: Vascular Surgery

## 2012-05-13 ENCOUNTER — Encounter (HOSPITAL_COMMUNITY)
Admission: RE | Admit: 2012-05-13 | Discharge: 2012-05-13 | Disposition: A | Discharge status: Home / Self Care | Payer: Medicare Other | Source: Ambulatory Visit | Attending: Nephrology | Admitting: Nephrology

## 2012-05-13 DIAGNOSIS — N183 Chronic kidney disease, stage 3 unspecified: Secondary | ICD-10-CM | POA: Insufficient documentation

## 2012-05-13 DIAGNOSIS — D638 Anemia in other chronic diseases classified elsewhere: Secondary | ICD-10-CM | POA: Insufficient documentation

## 2012-05-13 MED ORDER — SODIUM CHLORIDE 0.9 % IV SOLN
INTRAVENOUS | Status: AC
  Administered 2012-05-13: 10:00:00 via INTRAVENOUS

## 2012-05-13 MED ORDER — FERUMOXYTOL INJECTION 510 MG/17 ML
510.0000 mg | INTRAVENOUS | Status: AC
  Administered 2012-05-13: 510 mg via INTRAVENOUS
  Filled 2012-05-13: qty 17

## 2012-05-13 MED ORDER — FERUMOXYTOL INJECTION 510 MG/17 ML: 510.0000 mg | INTRAVENOUS | Status: DC

## 2012-05-14 ENCOUNTER — Ambulatory Visit: Payer: Medicare Other | Admitting: Vascular Surgery

## 2012-05-15 ENCOUNTER — Other Ambulatory Visit: Payer: Self-pay

## 2012-05-15 MED ORDER — PANTOPRAZOLE SODIUM 40 MG PO TBEC: 40.0000 mg | DELAYED_RELEASE_TABLET | Freq: Every day | ORAL | Status: DC

## 2012-05-20 ENCOUNTER — Encounter (HOSPITAL_COMMUNITY)
Admission: RE | Admit: 2012-05-20 | Discharge: 2012-05-20 | Disposition: A | Discharge status: Home / Self Care | Payer: Medicare Other | Source: Ambulatory Visit | Attending: Nephrology | Admitting: Nephrology

## 2012-05-20 LAB — IRON AND TIBC
Saturation Ratios: 37 % (ref 20–55)
TIBC: 262 ug/dL (ref 250–470)
UIBC: 166 ug/dL (ref 125–400)

## 2012-05-20 LAB — POCT HEMOGLOBIN-HEMACUE: Hemoglobin: 10.2 g/dL — ABNORMAL LOW (ref 12.0–15.0)

## 2012-05-20 MED ORDER — EPOETIN ALFA 40000 UNIT/ML IJ SOLN
INTRAMUSCULAR | Status: AC
  Administered 2012-05-20: 40000 [IU] via SUBCUTANEOUS
  Filled 2012-05-20: qty 1

## 2012-05-20 MED ORDER — EPOETIN ALFA 10000 UNIT/ML IJ SOLN: 40000.0000 [IU] | INTRAMUSCULAR | Status: DC

## 2012-05-27 ENCOUNTER — Encounter (HOSPITAL_COMMUNITY)
Admission: RE | Admit: 2012-05-27 | Discharge: 2012-05-27 | Disposition: A | Discharge status: Home / Self Care | Payer: Medicare Other | Source: Ambulatory Visit | Attending: Nephrology | Admitting: Nephrology

## 2012-05-27 LAB — POCT HEMOGLOBIN-HEMACUE: Hemoglobin: 10.6 g/dL — ABNORMAL LOW (ref 12.0–15.0)

## 2012-05-27 MED ORDER — EPOETIN ALFA 10000 UNIT/ML IJ SOLN: 40000.0000 [IU] | INTRAMUSCULAR | Status: DC

## 2012-05-27 MED ORDER — EPOETIN ALFA 40000 UNIT/ML IJ SOLN
INTRAMUSCULAR | Status: AC
  Administered 2012-05-27: 40000 [IU] via SUBCUTANEOUS
  Filled 2012-05-27: qty 1

## 2012-06-02 ENCOUNTER — Other Ambulatory Visit (HOSPITAL_COMMUNITY): Payer: Self-pay | Admitting: *Deleted

## 2012-06-03 ENCOUNTER — Encounter (HOSPITAL_COMMUNITY)
Admission: RE | Admit: 2012-06-03 | Discharge: 2012-06-03 | Disposition: A | Discharge status: Home / Self Care | Payer: Medicare Other | Source: Ambulatory Visit | Attending: Nephrology | Admitting: Nephrology

## 2012-06-03 MED ORDER — EPOETIN ALFA 10000 UNIT/ML IJ SOLN: 40000.0000 [IU] | INTRAMUSCULAR | Status: DC

## 2012-06-03 MED ORDER — EPOETIN ALFA 40000 UNIT/ML IJ SOLN
INTRAMUSCULAR | Status: AC
  Administered 2012-06-03: 40000 [IU] via SUBCUTANEOUS
  Filled 2012-06-03: qty 1

## 2012-06-05 ENCOUNTER — Other Ambulatory Visit: Payer: Self-pay | Admitting: Internal Medicine

## 2012-06-06 ENCOUNTER — Other Ambulatory Visit: Payer: Self-pay | Admitting: Internal Medicine

## 2012-06-06 MED ORDER — TORSEMIDE 20 MG PO TABS: 140.0000 mg | ORAL_TABLET | Freq: Every day | ORAL | Status: DC

## 2012-06-10 ENCOUNTER — Encounter (HOSPITAL_COMMUNITY)
Admission: RE | Admit: 2012-06-10 | Discharge: 2012-06-10 | Disposition: A | Discharge status: Home / Self Care | Payer: Medicare Other | Source: Ambulatory Visit | Attending: Nephrology | Admitting: Nephrology

## 2012-06-10 DIAGNOSIS — D638 Anemia in other chronic diseases classified elsewhere: Secondary | ICD-10-CM | POA: Insufficient documentation

## 2012-06-10 DIAGNOSIS — N183 Chronic kidney disease, stage 3 unspecified: Secondary | ICD-10-CM | POA: Insufficient documentation

## 2012-06-10 LAB — POCT HEMOGLOBIN-HEMACUE: Hemoglobin: 11.1 g/dL — ABNORMAL LOW (ref 12.0–15.0)

## 2012-06-10 MED ORDER — EPOETIN ALFA 10000 UNIT/ML IJ SOLN: 40000.0000 [IU] | INTRAMUSCULAR | Status: DC

## 2012-06-11 LAB — FERRITIN: Ferritin: 494 ng/mL — ABNORMAL HIGH (ref 10–291)

## 2012-06-17 ENCOUNTER — Encounter: Payer: Self-pay | Admitting: Vascular Surgery

## 2012-06-18 ENCOUNTER — Ambulatory Visit (INDEPENDENT_AMBULATORY_CARE_PROVIDER_SITE_OTHER): Payer: Medicare Other | Admitting: Vascular Surgery

## 2012-06-18 ENCOUNTER — Encounter: Payer: Self-pay | Admitting: Vascular Surgery

## 2012-06-18 VITALS — BP 135/47 | HR 64 | Temp 98.7°F | Ht 59.0 in | Wt 140.0 lb

## 2012-06-18 DIAGNOSIS — N186 End stage renal disease: Secondary | ICD-10-CM

## 2012-06-18 DIAGNOSIS — N184 Chronic kidney disease, stage 4 (severe): Secondary | ICD-10-CM

## 2012-06-18 DIAGNOSIS — Z01810 Encounter for preprocedural cardiovascular examination: Secondary | ICD-10-CM

## 2012-06-18 NOTE — Progress Notes (Signed)
[facility]  Patient name: [name] MRN: [id number] DOB: 02/08/1929 Sex: female  REASON FOR CONSULT: evaluate for hemodialysis access. Referred by Dr. Richard Fox.  HPI: [name] is a 78 y.o. female who is not yet on dialysis. She has progressing chronic renal insufficiency secondary to hypertension. She is right-handed. She has had a history of congestive heart failure but has had no recent uremic symptoms. She denies nausea, vomiting, fatigue, or anorexia.  Her daughter does describe some weakness in both upper extremities which has been gradually in onset.  Past Medical History  Diagnosis Date  . Restrictive cardiomyopathy   . Atrial fibrillation 1996    S/P cardioversion  . CVA (cerebral infarction) 1999  . COAD (chronic obstructive airways disease) 2007    Exacerbation  . Sleep apnea   . CPAP (continuous positive airway pressure) dependence   . Anemia     NOS  . Iron deficiency   . Gout   . Peptic ulcer, acute with hemorrhage 2005, 2011  . Hypertension   . Hypothyroidism   . Diastolic dysfunction   . Adenomatous colon polyp   . GERD (gastroesophageal reflux disease)   . Hiatal hernia   . Diverticulosis   . Anxiety disorder   . Arthritis   . Internal and external hemorrhoids without complication   . COPD (chronic obstructive pulmonary disease)   . Morbid obesity   . CAD (coronary artery disease) 08/06/2008    One vessel by cath, EF overall preserved by echo 04/30/2008  . On home oxygen therapy   . Renal failure     Dr. Fox  . Hyperlipidemia   . CHF (congestive heart failure)     Family History  Problem Relation Age of Onset  . Breast cancer Mother   . Cancer Mother     breast  . Heart disease Father     MI  . Heart attack Father   . Breast cancer Sister   . Cancer Sister     breast  . Peripheral vascular disease Sister   . Heart disease Brother     CABG 5 vessel  . Rheum arthritis Brother   . Colon cancer  Neg Hx     SOCIAL HISTORY: History  Substance Use Topics  . Smoking status: Never Smoker   . Smokeless tobacco: Never Used  . Alcohol Use: No    No Known Allergies  Current Outpatient Prescriptions  Medication Sig Dispense Refill  . acetaminophen (TYLENOL) 325 MG tablet Take 650 mg by mouth every 4 (four) hours as needed.        . allopurinol (ZYLOPRIM) 100 MG tablet TAKE 2 TABLETS BY MOUTH EVERY DAY  60 tablet  5  . epoetin alfa (PROCRIT) 40000 UNIT/ML injection Inject into the skin once a week. Take 1 injection every 2 weeks.      . hydrocortisone (ANUSOL-HC) 25 MG suppository Place 25 mg rectally at bedtime. Use for 10 days.       . levothyroxine (SYNTHROID, LEVOTHROID) 75 MCG tablet TAKE 1 TABLET BY MOUTH EVERY DAY  90 tablet  1  . losartan (COZAAR) 100 MG tablet TAKE ONE TABLET BY MOUTH DAILY  90 tablet  4  . metolazone (ZAROXOLYN) 2.5 MG tablet Take 1 tablet (2.5 mg total) by mouth every other day as needed.  210 tablet  5  . metoprolol (TOPROL-XL) 50 MG 24 hr tablet TAKE 1/2 TABLET BY MOUTH EVERY DAY  30 tablet  5  .   NON FORMULARY OXYGEN 2 LITERS       . pantoprazole (PROTONIX) 40 MG tablet Take 1 tablet (40 mg total) by mouth daily.  60 tablet  0  . potassium chloride 40 MEQ/15ML (20%) LIQD Take 15 mLs by mouth 2 (two) times daily.       . simvastatin (ZOCOR) 20 MG tablet TAKE 1 TABLET BY MOUTH EVERY NIGHT AT BEDTIME  90 tablet  2  . torsemide (DEMADEX) 20 MG tablet Take 7 tablets (140 mg total) by mouth daily.  210 tablet  0  . traMADol (ULTRAM) 50 MG tablet TAKE  1/2 TO 1 TABLET BY MOUTH EVERY 8 TO 12 HOURS AS NEEDED  30 tablet  0  . vitamin C (ASCORBIC ACID) 500 MG tablet Take 500 mg by mouth daily.        . levalbuterol (XOPENEX HFA) 45 MCG/ACT inhaler Inhale 1-2 puffs into the lungs 3 (three) times daily.        . predniSONE (DELTASONE) 5 MG tablet Take 5 mg by mouth daily.          REVIEW OF SYSTEMS: [X ] denotes positive finding; [  ] denotes negative finding    CARDIOVASCULAR:  [X ] chest painnone recently.   [ ] chest pressure   [ ] palpitations   [ ] orthopnea   [X ] dyspnea on exertion   [X ] claudication   [ ] rest pain   [ ] DVT   [ ] phlebitis PULMONARY:   [ ] productive cough   [ ] asthma   [ ] wheezing NEUROLOGIC:   [X ] weakness in both upper extremities. [ ] paresthesias  [ ] aphasia  [ ] amaurosis  [ ] dizziness HEMATOLOGIC:   [ ] bleeding problems   [ ] clotting disorders MUSCULOSKELETAL:  [ ] joint pain   [ ] joint swelling [ ] leg swelling GASTROINTESTINAL: [ ]  blood in stool  [ ]  hematemesis GENITOURINARY:  [ ]  dysuria  [ ]  hematuria PSYCHIATRIC:  [ ] history of major depression INTEGUMENTARY:  [X ] rashes  [ ] ulcers CONSTITUTIONAL:  [ ] fever   [ ] chills  PHYSICAL EXAM: Filed Vitals:   06/18/12 0949  BP: 135/47  Pulse: 64  Temp: 98.7 F (37.1 C)  TempSrc: Oral  Height: 4' 11" (1.499 m)  Weight: 140 lb (63.504 kg)  SpO2: 97%   Body mass index is 28.28 kg/(m^2). GENERAL: The patient is a well-nourished female, in no acute distress. The vital signs are documented above. CARDIOVASCULAR: There is a regular rate and rhythm without significant murmur appreciated. She has palpable radial pulses bilaterally. She has no significant lower extremity swelling. PULMONARY: There is good air exchange bilaterally without wheezing or rales. ABDOMEN: Soft and non-tender with normal pitched bowel sounds.  MUSCULOSKELETAL: There are no major deformities or cyanosis. NEUROLOGIC: No focal weakness or paresthesias are detected. SKIN: There are no ulcers or rashes noted. PSYCHIATRIC: The patient has a normal affect.  DATA:   Lab Results  Component Value Date   NA 141 04/28/2012   K 4.1 04/28/2012   CL 100 04/28/2012   CO2 27 04/28/2012   Lab Results  Component Value Date   CREATININE 2.12* 04/28/2012   Lab Results  Component Value Date   INR 1.43 10/20/2010   INR 1.96* 10/19/2010   INR 2.9 10/18/2010   PROTIME 25.3 05/30/2009    Lab Results  Component Value Date     HGBA1C  Value: 5.4 (NOTE)                                                                       According to the ADA Clinical Practice Recommendations for 2011, when HbA1c is used as a screening test:   >=6.5%   Diagnostic of Diabetes Mellitus           (if abnormal result  is confirmed)  5.7-6.4%   Increased risk of developing Diabetes Mellitus  References:Diagnosis and Classification of Diabetes Mellitus,Diabetes Care,2011,34(Suppl 1):S62-S69 and Standards of Medical Care in         Diabetes - 2011,Diabetes Care,2011,34  (Suppl 1):S11-S61. 08/20/2010   I have independently interpreted her vein mapping which shows the forearm cephalic vein on both sides is quite small. The cephalic veins emptying into the basilic system bilaterally. The basilic veins look reasonable in size bilaterally.  I have reviewed her records from Dr. Richard Fox's office.She has progressive renal insufficiency. Her most recent creatinine was 2.85.  MEDICAL ISSUES: I've recommended that we explore her forearm cephalic vein on the left and this is adequate please radial cephalic fistula. If this is not adequate I think she could likely have a basilic vein transposition on the left. If neither were adequate she would require an AV graft. I have explained the indications for placement of an AV fistula or AV graft. I've explained that if at all possible we will place an AV fistula.  I have reviewed the risks of placement of an AV fistula including but not limited to: failure of the fistula to mature, need for subsequent interventions, and thrombosis. In addition I have reviewed the potential complications of placement of an AV graft. These risks include, but are not limited to, graft thrombosis, graft infection, wound healing problems, bleeding, arm swelling, and steal syndrome. All the patient's questions were answered and they are agreeable to proceed with surgery. She will call to schedule this  in the near future.   DICKSON,CHRISTOPHER S [facility] Beeper: [phone]    

## 2012-06-18 NOTE — Progress Notes (Signed)
Bilateral UE vein mapping for AVF placement performed @ [facility] 06/18/2012

## 2012-06-23 ENCOUNTER — Other Ambulatory Visit (HOSPITAL_COMMUNITY): Payer: Self-pay | Admitting: *Deleted

## 2012-06-24 ENCOUNTER — Encounter (HOSPITAL_COMMUNITY)
Admission: RE | Admit: 2012-06-24 | Discharge: 2012-06-24 | Disposition: A | Discharge status: Home / Self Care | Payer: Medicare Other | Source: Ambulatory Visit | Attending: Nephrology | Admitting: Nephrology

## 2012-06-24 LAB — IRON AND TIBC
Iron: 83 ug/dL (ref 42–135)
Saturation Ratios: 32 % (ref 20–55)
TIBC: 256 ug/dL (ref 250–470)

## 2012-06-24 MED ORDER — EPOETIN ALFA 10000 UNIT/ML IJ SOLN: 40000.0000 [IU] | INTRAMUSCULAR | Status: DC

## 2012-06-24 MED ORDER — EPOETIN ALFA 40000 UNIT/ML IJ SOLN
INTRAMUSCULAR | Status: AC
  Administered 2012-06-24: 40000 [IU] via SUBCUTANEOUS
  Filled 2012-06-24: qty 1

## 2012-06-25 NOTE — Procedures (Unsigned)
CEPHALIC VEIN MAPPING  INDICATION:  Chronic kidney disease, stage IV.  HISTORY:  EXAM:  The right cephalic vein is compressible, however, is not visualized in the upper arm segment.  Diameter measurements range from 0.17 to 0.36 cm.  The right basilic vein is compressible.  Diameter measurements range from 0.45 to 0.66 cm.  The left cephalic vein is compressible, however, is not visualized in the upper arm segment.  Diameter measurements range from 0.20 to 0.27 cm.  The left basilic vein is compressible.  Diameter measurements range from 0.36 to 0.57 cm.  See attached worksheet for all measurements.  IMPRESSION:  Patent bilateral cephalic and basilic veins, with diameter measurements as described above and on worksheet when and where visualized.  ___________________________________________ Di Kindle. Edilia Bo, M.D.  SH/MEDQ  D:  06/18/2012  T:  06/18/2012  Job:  [id number]

## 2012-06-30 ENCOUNTER — Encounter (HOSPITAL_COMMUNITY): Payer: Self-pay | Admitting: Pharmacy Technician

## 2012-06-30 ENCOUNTER — Other Ambulatory Visit: Payer: Self-pay

## 2012-07-01 ENCOUNTER — Encounter (HOSPITAL_COMMUNITY)
Admission: RE | Admit: 2012-07-01 | Discharge: 2012-07-01 | Disposition: A | Discharge status: Home / Self Care | Payer: Medicare Other | Source: Ambulatory Visit | Attending: Nephrology | Admitting: Nephrology

## 2012-07-01 LAB — POCT HEMOGLOBIN-HEMACUE: Hemoglobin: 10.9 g/dL — ABNORMAL LOW (ref 12.0–15.0)

## 2012-07-01 MED ORDER — EPOETIN ALFA 40000 UNIT/ML IJ SOLN
INTRAMUSCULAR | Status: AC
  Filled 2012-07-01: qty 1

## 2012-07-01 MED ORDER — EPOETIN ALFA 40000 UNIT/ML IJ SOLN
40000.0000 [IU] | INTRAMUSCULAR | Status: DC
  Administered 2012-07-01: 40000 [IU] via SUBCUTANEOUS

## 2012-07-01 MED ORDER — EPOETIN ALFA 10000 UNIT/ML IJ SOLN: 40000.0000 [IU] | INTRAMUSCULAR | Status: DC

## 2012-07-02 MED FILL — Epoetin Alfa Inj 40000 Unit/ML: INTRAMUSCULAR | Qty: 1 | Status: AC

## 2012-07-03 ENCOUNTER — Encounter (HOSPITAL_COMMUNITY): Payer: Self-pay

## 2012-07-03 ENCOUNTER — Ambulatory Visit (HOSPITAL_COMMUNITY)
Admission: RE | Admit: 2012-07-03 | Discharge: 2012-07-03 | Disposition: A | Discharge status: Home / Self Care | Payer: Medicare Other | Source: Ambulatory Visit | Attending: Vascular Surgery | Admitting: Vascular Surgery

## 2012-07-03 ENCOUNTER — Encounter (HOSPITAL_COMMUNITY)
Admission: RE | Admit: 2012-07-03 | Discharge: 2012-07-03 | Disposition: A | Discharge status: Home / Self Care | Payer: Medicare Other | Source: Ambulatory Visit | Attending: Vascular Surgery | Admitting: Vascular Surgery

## 2012-07-03 DIAGNOSIS — N189 Chronic kidney disease, unspecified: Secondary | ICD-10-CM | POA: Insufficient documentation

## 2012-07-03 DIAGNOSIS — Z01812 Encounter for preprocedural laboratory examination: Secondary | ICD-10-CM | POA: Insufficient documentation

## 2012-07-03 DIAGNOSIS — I517 Cardiomegaly: Secondary | ICD-10-CM | POA: Insufficient documentation

## 2012-07-03 DIAGNOSIS — Z01818 Encounter for other preprocedural examination: Secondary | ICD-10-CM | POA: Insufficient documentation

## 2012-07-03 HISTORY — DX: Other complications of anesthesia, initial encounter: T88.59XA

## 2012-07-03 HISTORY — DX: Cerebral infarction, unspecified: I63.9

## 2012-07-03 HISTORY — DX: Adverse effect of unspecified anesthetic, initial encounter: T41.45XA

## 2012-07-03 HISTORY — DX: Rash and other nonspecific skin eruption: R21

## 2012-07-03 LAB — SURGICAL PCR SCREEN
MRSA, PCR: NEGATIVE
Staphylococcus aureus: NEGATIVE

## 2012-07-03 MED ORDER — CEFAZOLIN SODIUM-DEXTROSE 2-3 GM-% IV SOLR
2.0000 g | INTRAVENOUS | Status: AC
  Administered 2012-07-04: 2 g via INTRAVENOUS
  Filled 2012-07-03: qty 50

## 2012-07-03 NOTE — Consult Note (Signed)
Anesthesia Chart Review:  Patient is a 78 year old female scheduled for a LUE AVF versus BVT versus AVGG on 07/04/12 by Dr. Edilia Bo.  History includes non-smoker, "soreness" after anesthesia, afib, restrictive CM, CHF,  hypothyroidism, anxiety, COPD, home oxygen, OSA, CVA, GERD, hiatal hernia, anemia, HLD, 40-59% bilateral ICA stenosis (01/2012).  She is not yet on HD.  Nephrologist is Dr. Caryn Section.  PCP is Dr. Marga Melnick.  Her Cardiologist is Dr. Tenny Craw, last visit was on 12/06/11.  By notes, she seemed overall stable.   EKG then showed afib @ 64, right BBB, septal infarct (age undetermined), inferolateral T wave abnormality, consider ischemia.  I think her EKG is stable since at least August 2010.    Echo on 10/20/10 showed: - Left ventricle: The cavity size was normal. Wall thickness was increased in a pattern of mild LVH. The estimated ejection fraction was 55%. Wall motion was normal; there were no regional wall motion abnormalities. Doppler parameters are consistent with high ventricular filling pressure. - Mitral valve: Calcified annulus. Mild regurgitation. - Left atrium: The atrium was massively dilated. - Right ventricle: The cavity size was mildly dilated. Systolic function was mildly reduced. - Right atrium: The atrium was massively dilated. - Pulmonary arteries: PA peak pressure: 51mm Hg (S). Impressions: - The huge atria are c/w the prior diagnosis of restrictive Cardiomyopathy.  Cardiac cath on 08/06/08 showed: 1. Significant 1-vessel coronary artery disease in the OM-2 (DIAG 40% ostial lesion, proximal CX 30-40%, proximal OM2 70-80%, PDA minor irregularities). 2. Markedly elevated biventricular pressure suggestive of restrictive cardiomyopathy.  3. Moderate pulmonary hypertension with both pulmonary arterial and pulmonary venous components. 4. Hemodynamics: Central aortic pressure 179/77, mean 122. LV pressure 177/30 with an EDP of 32. Right atrium had a mean of 24, RV pressure was  68/55 and RVEDP of 28. PA pressure was 70/34 with a mean of 51. Pulmonary capillary wedge pressure ranged 32-34. Fick cardiac  index was 5.1 liters per minute. Cardiac output was 5.1 liters per minute. Cardiac index was 2.8 liters per minute per meter squared. Pulmonary vascular resistance was 3.7 Woods units. There is no LV/RV interaction on simultaneous pressures.    CXR on 07/03/12 showed stable severe cardiac enlargement. No active lung disease.   Labs on arrival.    Reviewed history with Anesthesiologist Dr. Rebecka Apley.  If labs are reasonable and she has no acute cardiopulmonary symptoms then anticipate she can proceed as planned.    Shonna Chock, PA-C

## 2012-07-03 NOTE — Progress Notes (Signed)
Dr. Alwyn Ren is her primary physician Dr. Tenny Craw is her cardiologist. EKG and Stress Test within last 5 years; cardiac cath >5 years Sleep Study - [facility] 5 years ago.

## 2012-07-03 NOTE — Pre-Procedure Instructions (Signed)
22 [name]  07/03/2012   Your procedure is scheduled on:  July 26th  Report to [facility] at 1000 AM.  Call this number if you have problems the morning of surgery: [phone]   Remember:   Do not eat food or drink:After Midnight.  Take these medicines the morning of surgery with A SIP OF WATER: Toprol, Protonix, Synthroid, Ultram(if needed)   Do not wear jewelry, make-up or nail polish.  Do not wear lotions, powders, or perfumes.   Do not shave 48 hours prior to surgery. Men may shave face and neck.  Do not bring valuables to the hospital.  Contacts, dentures or bridgework may not be worn into surgery.  Leave suitcase in the car. After surgery it may be brought to your room.  For patients admitted to the hospital, checkout time is 11:00 AM the day of discharge.   Patients discharged the day of surgery will not be allowed to drive home.  Special Instructions: CHG Shower Use Special Wash: 1/2 bottle night before surgery and 1/2 bottle morning of surgery.   Please read over the following fact sheets that you were given: Pain Booklet, Coughing and Deep Breathing, MRSA Information and Surgical Site Infection Prevention

## 2012-07-04 ENCOUNTER — Telehealth: Payer: Self-pay | Admitting: Vascular Surgery

## 2012-07-04 ENCOUNTER — Encounter (HOSPITAL_COMMUNITY)
Admission: RE | Disposition: A | Discharge status: Home / Self Care | Payer: Self-pay | Source: Ambulatory Visit | Attending: Vascular Surgery

## 2012-07-04 ENCOUNTER — Ambulatory Visit (HOSPITAL_COMMUNITY)
Admission: RE | Admit: 2012-07-04 | Discharge: 2012-07-04 | Disposition: A | Discharge status: Home / Self Care | Payer: Medicare Other | Source: Ambulatory Visit | Attending: Vascular Surgery | Admitting: Vascular Surgery

## 2012-07-04 ENCOUNTER — Encounter (HOSPITAL_COMMUNITY): Payer: Self-pay | Admitting: *Deleted

## 2012-07-04 ENCOUNTER — Encounter (HOSPITAL_COMMUNITY): Payer: Self-pay | Admitting: Vascular Surgery

## 2012-07-04 ENCOUNTER — Ambulatory Visit (HOSPITAL_COMMUNITY): Payer: Medicare Other | Admitting: Vascular Surgery

## 2012-07-04 DIAGNOSIS — M129 Arthropathy, unspecified: Secondary | ICD-10-CM | POA: Insufficient documentation

## 2012-07-04 DIAGNOSIS — N19 Unspecified kidney failure: Secondary | ICD-10-CM

## 2012-07-04 DIAGNOSIS — Z01818 Encounter for other preprocedural examination: Secondary | ICD-10-CM | POA: Insufficient documentation

## 2012-07-04 DIAGNOSIS — N189 Chronic kidney disease, unspecified: Secondary | ICD-10-CM | POA: Insufficient documentation

## 2012-07-04 DIAGNOSIS — K219 Gastro-esophageal reflux disease without esophagitis: Secondary | ICD-10-CM | POA: Insufficient documentation

## 2012-07-04 DIAGNOSIS — J449 Chronic obstructive pulmonary disease, unspecified: Secondary | ICD-10-CM | POA: Insufficient documentation

## 2012-07-04 DIAGNOSIS — M1990 Unspecified osteoarthritis, unspecified site: Secondary | ICD-10-CM | POA: Insufficient documentation

## 2012-07-04 DIAGNOSIS — C001 Malignant neoplasm of external lower lip: Secondary | ICD-10-CM | POA: Insufficient documentation

## 2012-07-04 DIAGNOSIS — I4891 Unspecified atrial fibrillation: Secondary | ICD-10-CM | POA: Insufficient documentation

## 2012-07-04 DIAGNOSIS — Z8711 Personal history of peptic ulcer disease: Secondary | ICD-10-CM | POA: Insufficient documentation

## 2012-07-04 DIAGNOSIS — N186 End stage renal disease: Secondary | ICD-10-CM

## 2012-07-04 DIAGNOSIS — Z01812 Encounter for preprocedural laboratory examination: Secondary | ICD-10-CM | POA: Insufficient documentation

## 2012-07-04 DIAGNOSIS — Z9981 Dependence on supplemental oxygen: Secondary | ICD-10-CM | POA: Insufficient documentation

## 2012-07-04 DIAGNOSIS — E039 Hypothyroidism, unspecified: Secondary | ICD-10-CM | POA: Insufficient documentation

## 2012-07-04 DIAGNOSIS — I129 Hypertensive chronic kidney disease with stage 1 through stage 4 chronic kidney disease, or unspecified chronic kidney disease: Secondary | ICD-10-CM | POA: Insufficient documentation

## 2012-07-04 DIAGNOSIS — J4489 Other specified chronic obstructive pulmonary disease: Secondary | ICD-10-CM | POA: Insufficient documentation

## 2012-07-04 LAB — POCT I-STAT 4, (NA,K, GLUC, HGB,HCT)
Glucose, Bld: 102 mg/dL — ABNORMAL HIGH (ref 70–99)
HCT: 38 % (ref 36.0–46.0)
Hemoglobin: 12.9 g/dL (ref 12.0–15.0)
Potassium: 3.6 mEq/L (ref 3.5–5.1)

## 2012-07-04 SURGERY — TRANSPOSITION, VEIN, BASILIC
Anesthesia: Monitor Anesthesia Care | Site: Arm Upper | Laterality: Left | Wound class: Clean

## 2012-07-04 MED ORDER — PHENYLEPHRINE HCL 10 MG/ML IJ SOLN
INTRAMUSCULAR | Status: DC | PRN
  Administered 2012-07-04: 40 ug via INTRAVENOUS

## 2012-07-04 MED ORDER — HYDROMORPHONE HCL PF 1 MG/ML IJ SOLN: 0.2500 mg | INTRAMUSCULAR | Status: DC | PRN

## 2012-07-04 MED ORDER — LIDOCAINE HCL (PF) 1 % IJ SOLN
INTRAMUSCULAR | Status: AC
  Filled 2012-07-04: qty 30

## 2012-07-04 MED ORDER — SODIUM CHLORIDE 0.9 % IR SOLN
Status: DC | PRN
  Administered 2012-07-04: 12:00:00

## 2012-07-04 MED ORDER — 0.9 % SODIUM CHLORIDE (POUR BTL) OPTIME
TOPICAL | Status: DC | PRN
  Administered 2012-07-04: 1000 mL

## 2012-07-04 MED ORDER — DEXTROSE 5 % IV SOLN
INTRAVENOUS | Status: DC | PRN
  Administered 2012-07-04: 11:00:00 via INTRAVENOUS

## 2012-07-04 MED ORDER — MIDAZOLAM HCL 5 MG/5ML IJ SOLN
INTRAMUSCULAR | Status: DC | PRN
  Administered 2012-07-04 (×2): 1 mg via INTRAVENOUS

## 2012-07-04 MED ORDER — LIDOCAINE-EPINEPHRINE (PF) 1 %-1:200000 IJ SOLN
INTRAMUSCULAR | Status: AC
  Filled 2012-07-04: qty 10

## 2012-07-04 MED ORDER — LIDOCAINE HCL (PF) 1 % IJ SOLN
INTRAMUSCULAR | Status: DC | PRN
  Administered 2012-07-04: 28 mL

## 2012-07-04 MED ORDER — LIDOCAINE HCL (PF) 1 % IJ SOLN
INTRAMUSCULAR | Status: AC
  Filled 2012-07-04: qty 5

## 2012-07-04 MED ORDER — SODIUM CHLORIDE 0.9 % IV SOLN
INTRAVENOUS | Status: DC | PRN
  Administered 2012-07-04 (×2): via INTRAVENOUS

## 2012-07-04 MED ORDER — OXYCODONE-ACETAMINOPHEN 5-325 MG PO TABS
ORAL_TABLET | ORAL | Status: AC
  Administered 2012-07-04: 1
  Filled 2012-07-04: qty 1

## 2012-07-04 MED ORDER — SODIUM CHLORIDE 0.9 % IV SOLN
INTRAVENOUS | Status: DC
  Administered 2012-07-04: 35 mL/h via INTRAVENOUS

## 2012-07-04 MED ORDER — OXYCODONE-ACETAMINOPHEN 5-325 MG PO TABS: 1.0000 | ORAL_TABLET | ORAL | Status: AC | PRN

## 2012-07-04 MED ORDER — PROTAMINE SULFATE 10 MG/ML IV SOLN
INTRAVENOUS | Status: DC | PRN
  Administered 2012-07-04 (×3): 10 mg via INTRAVENOUS

## 2012-07-04 MED ORDER — FENTANYL CITRATE 0.05 MG/ML IJ SOLN
INTRAMUSCULAR | Status: DC | PRN
  Administered 2012-07-04 (×5): 50 ug via INTRAVENOUS

## 2012-07-04 MED ORDER — TRAMADOL HCL 50 MG PO TABS: 25.0000 mg | ORAL_TABLET | Freq: Three times a day (TID) | ORAL | Status: DC | PRN

## 2012-07-04 MED ORDER — OXYCODONE-ACETAMINOPHEN 5-325 MG/5ML PO SOLN: 5.0000 mL | Freq: Once | ORAL | Status: DC

## 2012-07-04 MED ORDER — HEPARIN SODIUM (PORCINE) 1000 UNIT/ML IJ SOLN
INTRAMUSCULAR | Status: DC | PRN
  Administered 2012-07-04: 4000 [IU] via INTRAVENOUS

## 2012-07-04 MED ORDER — ONDANSETRON HCL 4 MG/2ML IJ SOLN: 4.0000 mg | Freq: Once | INTRAMUSCULAR | Status: DC | PRN

## 2012-07-04 SURGICAL SUPPLY — 43 items
ADH SKN CLS APL DERMABOND .7 (GAUZE/BANDAGES/DRESSINGS) ×4
BANDAGE ELASTIC 4 VELCRO ST LF (GAUZE/BANDAGES/DRESSINGS) ×4 IMPLANT
CANISTER SUCTION 2500CC (MISCELLANEOUS) ×3 IMPLANT
CLIP TI MEDIUM 6 (CLIP) ×3 IMPLANT
CLIP TI WIDE RED SMALL 6 (CLIP) ×5 IMPLANT
CLOTH BEACON ORANGE TIMEOUT ST (SAFETY) ×3 IMPLANT
COVER PROBE W GEL 5X96 (DRAPES) ×3 IMPLANT
COVER SURGICAL LIGHT HANDLE (MISCELLANEOUS) ×3 IMPLANT
DECANTER SPIKE VIAL GLASS SM (MISCELLANEOUS) ×3 IMPLANT
DERMABOND ADVANCED (GAUZE/BANDAGES/DRESSINGS) ×2
DERMABOND ADVANCED .7 DNX12 (GAUZE/BANDAGES/DRESSINGS) ×3 IMPLANT
DRAIN PENROSE 1/2X12 LTX STRL (WOUND CARE) IMPLANT
ELECT REM PT RETURN 9FT ADLT (ELECTROSURGICAL) ×3
ELECTRODE REM PT RTRN 9FT ADLT (ELECTROSURGICAL) ×2 IMPLANT
GEL ULTRASOUND 20GR AQUASONIC (MISCELLANEOUS) ×2 IMPLANT
GLOVE BIO SURGEON STRL SZ 6.5 (GLOVE) ×2 IMPLANT
GLOVE BIO SURGEON STRL SZ7.5 (GLOVE) ×3 IMPLANT
GLOVE BIOGEL PI IND STRL 6.5 (GLOVE) ×2 IMPLANT
GLOVE BIOGEL PI IND STRL 7.0 (GLOVE) ×1 IMPLANT
GLOVE BIOGEL PI IND STRL 7.5 (GLOVE) ×3 IMPLANT
GLOVE BIOGEL PI INDICATOR 6.5 (GLOVE) ×2
GLOVE BIOGEL PI INDICATOR 7.0 (GLOVE) ×1
GLOVE BIOGEL PI INDICATOR 7.5 (GLOVE) ×2
GLOVE ECLIPSE 6.5 STRL STRAW (GLOVE) ×2 IMPLANT
GLOVE SURG SS PI 7.5 STRL IVOR (GLOVE) ×2 IMPLANT
GOWN PREVENTION PLUS XLARGE (GOWN DISPOSABLE) ×2 IMPLANT
GOWN STRL NON-REIN LRG LVL3 (GOWN DISPOSABLE) ×8 IMPLANT
KIT BASIN OR (CUSTOM PROCEDURE TRAY) ×3 IMPLANT
KIT ROOM TURNOVER OR (KITS) ×3 IMPLANT
NS IRRIG 1000ML POUR BTL (IV SOLUTION) ×3 IMPLANT
PACK CV ACCESS (CUSTOM PROCEDURE TRAY) ×3 IMPLANT
PAD ARMBOARD 7.5X6 YLW CONV (MISCELLANEOUS) ×6 IMPLANT
SPONGE GAUZE 4X4 12PLY (GAUZE/BANDAGES/DRESSINGS) ×3 IMPLANT
SPONGE SURGIFOAM ABS GEL 100 (HEMOSTASIS) IMPLANT
SUT PROLENE 6 0 BV (SUTURE) ×3 IMPLANT
SUT SILK 2 0 SH (SUTURE) ×2 IMPLANT
SUT VIC AB 3-0 SH 27 (SUTURE) ×9
SUT VIC AB 3-0 SH 27X BRD (SUTURE) ×4 IMPLANT
SUT VICRYL 4-0 PS2 18IN ABS (SUTURE) ×7 IMPLANT
TOWEL OR 17X24 6PK STRL BLUE (TOWEL DISPOSABLE) ×3 IMPLANT
TOWEL OR 17X26 10 PK STRL BLUE (TOWEL DISPOSABLE) ×3 IMPLANT
UNDERPAD 30X30 INCONTINENT (UNDERPADS AND DIAPERS) ×3 IMPLANT
WATER STERILE IRR 1000ML POUR (IV SOLUTION) ×3 IMPLANT

## 2012-07-04 NOTE — H&P (View-Only) (Signed)
[facility] [place]  Patient name: SORAYA PAQUETTE MRN: [id number] DOB: 1929-02-25 Sex: female  REASON FOR CONSULT: evaluate for hemodialysis access. Referred by Dr. Marina Gravel.  HPI: [name] is a 78 y.o. female who is not yet on dialysis. She has progressing chronic renal insufficiency secondary to hypertension. She is right-handed. She has had a history of congestive heart failure but has had no recent uremic symptoms. She denies nausea, vomiting, fatigue, or anorexia.  Her daughter does describe some weakness in both upper extremities which has been gradually in onset.  Past Medical History  Diagnosis Date  . Restrictive cardiomyopathy   . Atrial fibrillation 1996    S/P cardioversion  . CVA (cerebral infarction) 1999  . COAD (chronic obstructive airways disease) 2007    Exacerbation  . Sleep apnea   . CPAP (continuous positive airway pressure) dependence   . Anemia     NOS  . Iron deficiency   . Gout   . Peptic ulcer, acute with hemorrhage 2005, 2011  . Hypertension   . Hypothyroidism   . Diastolic dysfunction   . Adenomatous colon polyp   . GERD (gastroesophageal reflux disease)   . Hiatal hernia   . Diverticulosis   . Anxiety disorder   . Arthritis   . Internal and external hemorrhoids without complication   . COPD (chronic obstructive pulmonary disease)   . Morbid obesity   . CAD (coronary artery disease) 08/06/2008    One vessel by cath, EF overall preserved by echo 04/30/2008  . On home oxygen therapy   . Renal failure     Dr. Caryn Section  . Hyperlipidemia   . CHF (congestive heart failure)     Family History  Problem Relation Age of Onset  . Breast cancer Mother   . Cancer Mother     breast  . Heart disease Father     MI  . Heart attack Father   . Breast cancer Sister   . Cancer Sister     breast  . Peripheral vascular disease Sister   . Heart disease Brother     CABG 5 vessel  . Rheum arthritis Brother   . Colon cancer  Neg Hx     SOCIAL HISTORY: History  Substance Use Topics  . Smoking status: Never Smoker   . Smokeless tobacco: Never Used  . Alcohol Use: No    No Known Allergies  Current Outpatient Prescriptions  Medication Sig Dispense Refill  . acetaminophen (TYLENOL) 325 MG tablet Take 650 mg by mouth every 4 (four) hours as needed.        Marland Kitchen allopurinol (ZYLOPRIM) 100 MG tablet TAKE 2 TABLETS BY MOUTH EVERY DAY  60 tablet  5  . epoetin alfa (PROCRIT) 62952 UNIT/ML injection Inject into the skin once a week. Take 1 injection every 2 weeks.      . hydrocortisone (ANUSOL-HC) 25 MG suppository Place 25 mg rectally at bedtime. Use for 10 days.       Marland Kitchen levothyroxine (SYNTHROID, LEVOTHROID) 75 MCG tablet TAKE 1 TABLET BY MOUTH EVERY DAY  90 tablet  1  . losartan (COZAAR) 100 MG tablet TAKE ONE TABLET BY MOUTH DAILY  90 tablet  4  . metolazone (ZAROXOLYN) 2.5 MG tablet Take 1 tablet (2.5 mg total) by mouth every other day as needed.  210 tablet  5  . metoprolol (TOPROL-XL) 50 MG 24 hr tablet TAKE 1/2 TABLET BY MOUTH EVERY DAY  30 tablet  5  .  NON FORMULARY OXYGEN 2 LITERS       . pantoprazole (PROTONIX) 40 MG tablet Take 1 tablet (40 mg total) by mouth daily.  60 tablet  0  . potassium chloride 40 MEQ/15ML (20%) LIQD Take 15 mLs by mouth 2 (two) times daily.       . simvastatin (ZOCOR) 20 MG tablet TAKE 1 TABLET BY MOUTH EVERY NIGHT AT BEDTIME  90 tablet  2  . torsemide (DEMADEX) 20 MG tablet Take 7 tablets (140 mg total) by mouth daily.  210 tablet  0  . traMADol (ULTRAM) 50 MG tablet TAKE  1/2 TO 1 TABLET BY MOUTH EVERY 8 TO 12 HOURS AS NEEDED  30 tablet  0  . vitamin C (ASCORBIC ACID) 500 MG tablet Take 500 mg by mouth daily.        Marland Kitchen levalbuterol (XOPENEX HFA) 45 MCG/ACT inhaler Inhale 1-2 puffs into the lungs 3 (three) times daily.        . predniSONE (DELTASONE) 5 MG tablet Take 5 mg by mouth daily.          REVIEW OF SYSTEMS: Arly.Keller ] denotes positive finding; [  ] denotes negative finding    CARDIOVASCULAR:  Arly.Keller ] chest painnone recently.   [ ]  chest pressure   [ ]  palpitations   [ ]  orthopnea   Arly.Keller ] dyspnea on exertion   Arly.Keller ] claudication   [ ]  rest pain   [ ]  DVT   [ ]  phlebitis PULMONARY:   [ ]  productive cough   [ ]  asthma   [ ]  wheezing NEUROLOGIC:   Arly.Keller ] weakness in both upper extremities. [ ]  paresthesias  [ ]  aphasia  [ ]  amaurosis  [ ]  dizziness HEMATOLOGIC:   [ ]  bleeding problems   [ ]  clotting disorders MUSCULOSKELETAL:  [ ]  joint pain   [ ]  joint swelling [ ]  leg swelling GASTROINTESTINAL: [ ]   blood in stool  [ ]   hematemesis GENITOURINARY:  [ ]   dysuria  [ ]   hematuria PSYCHIATRIC:  [ ]  history of major depression INTEGUMENTARY:  Arly.Keller ] rashes  [ ]  ulcers CONSTITUTIONAL:  [ ]  fever   [ ]  chills  PHYSICAL EXAM: Filed Vitals:   06/18/12 0949  BP: 135/47  Pulse: 64  Temp: 98.7 F (37.1 C)  TempSrc: Oral  Height: 4\' 11"  (1.499 m)  Weight: 140 lb (63.504 kg)  SpO2: 97%   Body mass index is 28.28 kg/(m^2). GENERAL: The patient is a well-nourished female, in no acute distress. The vital signs are documented above. CARDIOVASCULAR: There is a regular rate and rhythm without significant murmur appreciated. She has palpable radial pulses bilaterally. She has no significant lower extremity swelling. PULMONARY: There is good air exchange bilaterally without wheezing or rales. ABDOMEN: Soft and non-tender with normal pitched bowel sounds.  MUSCULOSKELETAL: There are no major deformities or cyanosis. NEUROLOGIC: No focal weakness or paresthesias are detected. SKIN: There are no ulcers or rashes noted. PSYCHIATRIC: The patient has a normal affect.  DATA:   Lab Results  Component Value Date   NA 141 04/28/2012   K 4.1 04/28/2012   CL 100 04/28/2012   CO2 27 04/28/2012   Lab Results  Component Value Date   CREATININE 2.12* 04/28/2012   Lab Results  Component Value Date   INR 1.43 10/20/2010   INR 1.96* 10/19/2010   INR 2.9 10/18/2010   PROTIME 25.3 05/30/2009    Lab Results  Component Value Date  HGBA1C  Value: 5.4 (NOTE)                                                                       According to the ADA Clinical Practice Recommendations for 2011, when HbA1c is used as a screening test:   >=6.5%   Diagnostic of Diabetes Mellitus           (if abnormal result  is confirmed)  5.7-6.4%   Increased risk of developing Diabetes Mellitus  References:Diagnosis and Classification of Diabetes Mellitus,Diabetes Care,2011,34(Suppl 1):S62-S69 and Standards of Medical Care in         Diabetes - 2011,Diabetes Care,2011,34  (Suppl 1):S11-S61. 08/20/2010   I have independently interpreted her vein mapping which shows the forearm cephalic vein on both sides is quite small. The cephalic veins emptying into the basilic system bilaterally. The basilic veins look reasonable in size bilaterally.  I have reviewed her records from Dr. Gerlene Burdock Fox's office.She has progressive renal insufficiency. Her most recent creatinine was 2.85.  MEDICAL ISSUES: I've recommended that we explore her forearm cephalic vein on the left and this is adequate please radial cephalic fistula. If this is not adequate I think she could likely have a basilic vein transposition on the left. If neither were adequate she would require an AV graft. I have explained the indications for placement of an AV fistula or AV graft. I've explained that if at all possible we will place an AV fistula.  I have reviewed the risks of placement of an AV fistula including but not limited to: failure of the fistula to mature, need for subsequent interventions, and thrombosis. In addition I have reviewed the potential complications of placement of an AV graft. These risks include, but are not limited to, graft thrombosis, graft infection, wound healing problems, bleeding, arm swelling, and steal syndrome. All the patient's questions were answered and they are agreeable to proceed with surgery. She will call to schedule this  in the near future.   Kaila Devries S [facility] [place] Beeper: [phone]

## 2012-07-04 NOTE — Op Note (Signed)
NAME: [name]   MRN: [id number] DOB: 1929/03/19    DATE OF OPERATION: 07/04/2012  PREOP DIAGNOSIS: Chronic kidney disease  POSTOP DIAGNOSIS: Same  PROCEDURE: left basilic vein transposition  SURGEON: Di Kindle. Edilia Bo, MD, FACS  ASSIST: Darlyn Chamber, RNFA   ANESTHESIA: local with sedation   EBL: minimal  INDICATIONS: [name] is a 78 y.o. female who is not yet on dialysis. We were asked to place access.  FINDINGS: the basilic vein was approximately 4 mm.  TECHNIQUE: The patient was brought to the operating room and was sedated by anesthesia. The left upper extremity was prepped and draped in the usual sterile fashion. After the skin was anesthetized with 1% lidocaine, I explored the cephalic vein at the wrist and it was clearly not usable for a fistula. The cephalic vein into into the basilic vein. Therefore, the only other chance for a fistula in the left arm was a basilic vein transposition. Through 3 incisions along the medial aspect of the left upper arm the basilic vein was harvested from the antecubital level to the axilla. Branches were divided between clips and 3-0 silk ties. The vein was ligated distally and then irrigated out with heparinized saline. It was an approximately 4 mm vein. Through the distal incision the brachial artery was dissected free beneath the fascia. A tunnel was created between this incision and the incision near the axilla. The patient was then heparinized. Vein was distended and marked to prevent twisting. The vein was brought through the tunnel and the patient was heparinized. The brachial artery was clamped proximally and distally and a longitudinal arteriotomy was made. The vein was sewn end to side to the artery using continuous 6-0 Prolene suture. At the completion was an excellent thrill in the fistula. There was a good radial and ulnar signal with the Doppler. Hemostasis was obtained in the wounds and the wounds were each closed with  a deep layer of 3-0 Vicryl and the skin closed with 4-0 Vicryl. Dermabond was applied. The patient tolerated the procedure well and was transferred to the recovery room in stable condition. All needle and sponge counts were correct.  Waverly Ferrari, MD, FACS [facility]  DATE OF DICTATION:   07/04/2012

## 2012-07-04 NOTE — Progress Notes (Signed)
Drinking sprite no nausea dr. Edilia Bo at bedside  Gave patient a script may give her 1 roxicet for pain

## 2012-07-04 NOTE — Interval H&P Note (Signed)
History and Physical Interval Note:  07/04/2012 11:05 AM  [name]  has presented today for surgery, with the diagnosis of End Stage Renal Disease  The various methods of treatment have been discussed with the patient and family. After consideration of risks, benefits and other options for treatment, the patient has consented to: ARTERIOVENOUS (AV) FISTULA CREATION (Left) as a surgical intervention .  The patient's history has been reviewed, patient examined, no change in status, stable for surgery.  I have reviewed the patient's chart and labs.  Questions were answered to the patient's satisfaction.     [name] S

## 2012-07-04 NOTE — Anesthesia Preprocedure Evaluation (Addendum)
Anesthesia Evaluation  Patient identified by MRN, date of birth, ID band Patient awake    Reviewed: Allergy & Precautions, H&P , NPO status , Patient's Chart, lab work & pertinent test results, reviewed documented beta blocker date and time   Airway Mallampati: I TM Distance: >3 FB Neck ROM: Full    Dental  (+) Teeth Intact and Dental Advisory Given   Pulmonary shortness of breath and at rest, sleep apnea , COPD oxygen dependent,          Cardiovascular hypertension, Pt. on medications + CAD + dysrhythmias Atrial Fibrillation     Neuro/Psych CVA, Residual Symptoms    GI/Hepatic PUD, GERD-  Medicated and Controlled,  Endo/Other  Hypothyroidism   Renal/GU      Musculoskeletal  (+) Arthritis -, Osteoarthritis,    Abdominal   Peds  Hematology negative hematology ROS (+)   Anesthesia Other Findings Balance still disturbed from CVA in 2000  Reproductive/Obstetrics                         Anesthesia Physical Anesthesia Plan  ASA: III  Anesthesia Plan: MAC   Post-op Pain Management:    Induction: Intravenous  Airway Management Planned: Simple Face Mask and Nasal Cannula  Additional Equipment:   Intra-op Plan:   Post-operative Plan:   Informed Consent: I have reviewed the patients History and Physical, chart, labs and discussed the procedure including the risks, benefits and alternatives for the proposed anesthesia with the patient or authorized representative who has indicated his/her understanding and acceptance.   Dental advisory given  Plan Discussed with: CRNA and Surgeon  Anesthesia Plan Comments:        Anesthesia Quick Evaluation

## 2012-07-04 NOTE — Telephone Encounter (Addendum)
Message copied by Shari Prows on Fri Jul 04, 2012  2:18 PM ------      Message from: Melene Plan      Created: Fri Jul 04, 2012 12:13 PM                   ----- Message -----         From: Marlowe Shores, PA         Sent: 07/04/2012  12:09 PM           To: Melene Plan, RN            4 week F/U AVF - Dr. Edilia Bo  I scheduled an appt for this pt on 07/30/12 at 1:30pm. I left message for the pt and also mailed an appt letter. awt

## 2012-07-04 NOTE — Preoperative (Signed)
Beta Blockers   Reason not to administer Beta Blockers:Not Applicable, taken this am per patient 

## 2012-07-04 NOTE — Anesthesia Postprocedure Evaluation (Signed)
Anesthesia Post Note  Patient: [name]  Procedure(s) Performed: Procedure(s) (LRB): BASCILIC VEIN TRANSPOSITION (Left)  Anesthesia type: general  Patient location: PACU  Post pain: Pain level controlled  Post assessment: Patient's Cardiovascular Status Stable  Last Vitals:  Filed Vitals:   07/04/12 1445  BP:   Pulse: 45  Temp:   Resp: 20    Post vital signs: Reviewed and stable  Level of consciousness: sedated  Complications: No apparent anesthesia complications

## 2012-07-04 NOTE — Transfer of Care (Signed)
Immediate Anesthesia Transfer of Care Note  Patient: [name]  Procedure(s) Performed: Procedure(s) (LRB): BASCILIC VEIN TRANSPOSITION (Left)  Patient Location: PACU  Anesthesia Type: MAC  Level of Consciousness: awake, alert  and oriented  Airway & Oxygen Therapy: Patient Spontanous Breathing and Patient connected to nasal cannula oxygen  Post-op Assessment: Report given to PACU RN and Post -op Vital signs reviewed and stable  Post vital signs: Reviewed and stable  Complications: No apparent anesthesia complications

## 2012-07-04 NOTE — Anesthesia Procedure Notes (Signed)
Procedure Name: MAC Date/Time: 07/04/2012 11:23 AM Performed by: Nicholos Johns Pre-anesthesia Checklist: Patient identified, Emergency Drugs available, Suction available, Patient being monitored and Timeout performed Patient Re-evaluated:Patient Re-evaluated prior to inductionOxygen Delivery Method: Simple face mask Intubation Type: IV induction Placement Confirmation: positive ETCO2 and breath sounds checked- equal and bilateral Dental Injury: Teeth and Oropharynx as per pre-operative assessment

## 2012-07-08 ENCOUNTER — Other Ambulatory Visit: Payer: Self-pay | Admitting: Internal Medicine

## 2012-07-08 ENCOUNTER — Other Ambulatory Visit (HOSPITAL_COMMUNITY): Payer: Self-pay

## 2012-07-08 ENCOUNTER — Encounter (HOSPITAL_COMMUNITY)
Admission: RE | Admit: 2012-07-08 | Discharge: 2012-07-08 | Disposition: A | Discharge status: Home / Self Care | Payer: Medicare Other | Source: Ambulatory Visit | Attending: Nephrology | Admitting: Nephrology

## 2012-07-08 LAB — POCT HEMOGLOBIN-HEMACUE: Hemoglobin: 11.2 g/dL — ABNORMAL LOW (ref 12.0–15.0)

## 2012-07-08 MED ORDER — EPOETIN ALFA 10000 UNIT/ML IJ SOLN: 40000.0000 [IU] | INTRAMUSCULAR | Status: DC

## 2012-07-08 NOTE — Telephone Encounter (Signed)
Verify that 30 pills were given 07/04/12. It cannot be refilled if that were the case

## 2012-07-08 NOTE — Telephone Encounter (Signed)
OK #30 

## 2012-07-08 NOTE — Telephone Encounter (Signed)
Patient states she was not given rx for Tramadol, patient asked for a stronger med and was given rx for 10 percocet (patient recently had surgery for dialysis). Patient would like to continue with tramadol as a maintenance pain med.  Dr.Hopper please advise based on provided information

## 2012-07-08 NOTE — Telephone Encounter (Signed)
Dr.Hopper please advise, last OV 11/2011, med last filled on 07/04/12 #30 given

## 2012-07-15 ENCOUNTER — Encounter (HOSPITAL_COMMUNITY): Payer: Medicare Other

## 2012-07-22 ENCOUNTER — Encounter (HOSPITAL_COMMUNITY)
Admission: RE | Admit: 2012-07-22 | Discharge: 2012-07-22 | Disposition: A | Discharge status: Home / Self Care | Payer: Medicare Other | Source: Ambulatory Visit | Attending: Nephrology | Admitting: Nephrology

## 2012-07-22 DIAGNOSIS — D638 Anemia in other chronic diseases classified elsewhere: Secondary | ICD-10-CM | POA: Insufficient documentation

## 2012-07-22 DIAGNOSIS — N183 Chronic kidney disease, stage 3 unspecified: Secondary | ICD-10-CM | POA: Insufficient documentation

## 2012-07-22 LAB — RENAL FUNCTION PANEL
CO2: 30 mEq/L (ref 19–32)
Chloride: 99 mEq/L (ref 96–112)
GFR calc Af Amer: 24 mL/min — ABNORMAL LOW (ref >90)
GFR calc non Af Amer: 20 mL/min — ABNORMAL LOW (ref >90)
Sodium: 141 mEq/L (ref 135–145)

## 2012-07-22 LAB — POCT HEMOGLOBIN-HEMACUE: Hemoglobin: 10 g/dL — ABNORMAL LOW (ref 12.0–15.0)

## 2012-07-22 LAB — IRON AND TIBC: UIBC: 187 ug/dL (ref 125–400)

## 2012-07-22 MED ORDER — EPOETIN ALFA 40000 UNIT/ML IJ SOLN
INTRAMUSCULAR | Status: AC
  Administered 2012-07-22: 40000 [IU] via SUBCUTANEOUS
  Filled 2012-07-22: qty 1

## 2012-07-22 MED ORDER — EPOETIN ALFA 10000 UNIT/ML IJ SOLN: 40000.0000 [IU] | INTRAMUSCULAR | Status: DC

## 2012-07-23 LAB — PTH, INTACT AND CALCIUM: Calcium, Total (PTH): 9.2 mg/dL (ref 8.4–10.5)

## 2012-07-29 ENCOUNTER — Encounter (HOSPITAL_COMMUNITY)
Admission: RE | Admit: 2012-07-29 | Discharge: 2012-07-29 | Disposition: A | Discharge status: Home / Self Care | Payer: Medicare Other | Source: Ambulatory Visit | Attending: Nephrology | Admitting: Nephrology

## 2012-07-29 LAB — POCT HEMOGLOBIN-HEMACUE: Hemoglobin: 10.4 g/dL — ABNORMAL LOW (ref 12.0–15.0)

## 2012-07-29 MED ORDER — EPOETIN ALFA 10000 UNIT/ML IJ SOLN
40000.0000 [IU] | INTRAMUSCULAR | Status: DC
  Administered 2012-07-29: 40000 [IU] via SUBCUTANEOUS

## 2012-07-29 MED ORDER — EPOETIN ALFA 40000 UNIT/ML IJ SOLN
INTRAMUSCULAR | Status: AC
  Filled 2012-07-29: qty 1

## 2012-07-30 ENCOUNTER — Ambulatory Visit: Payer: Medicare Other | Admitting: Vascular Surgery

## 2012-08-05 ENCOUNTER — Encounter (HOSPITAL_COMMUNITY)
Admission: RE | Admit: 2012-08-05 | Discharge: 2012-08-05 | Disposition: A | Discharge status: Home / Self Care | Payer: Medicare Other | Source: Ambulatory Visit | Attending: Nephrology | Admitting: Nephrology

## 2012-08-05 ENCOUNTER — Encounter: Payer: Self-pay | Admitting: Vascular Surgery

## 2012-08-05 MED ORDER — EPOETIN ALFA 10000 UNIT/ML IJ SOLN: 40000.0000 [IU] | INTRAMUSCULAR | Status: DC

## 2012-08-05 MED ORDER — EPOETIN ALFA 40000 UNIT/ML IJ SOLN
INTRAMUSCULAR | Status: AC
  Administered 2012-08-05: 40000 [IU] via SUBCUTANEOUS
  Filled 2012-08-05: qty 1

## 2012-08-06 ENCOUNTER — Ambulatory Visit (INDEPENDENT_AMBULATORY_CARE_PROVIDER_SITE_OTHER): Payer: Medicare Other | Admitting: Vascular Surgery

## 2012-08-06 ENCOUNTER — Encounter: Payer: Self-pay | Admitting: Vascular Surgery

## 2012-08-06 ENCOUNTER — Other Ambulatory Visit: Payer: Self-pay | Admitting: Internal Medicine

## 2012-08-06 VITALS — BP 131/36 | HR 71 | Temp 97.5°F | Ht 59.0 in | Wt 142.0 lb

## 2012-08-06 DIAGNOSIS — N186 End stage renal disease: Secondary | ICD-10-CM

## 2012-08-06 NOTE — Progress Notes (Signed)
[facility] [place]  Patient name: [name] MRN: [id number] DOB: 01-31-1929 Sex: female  REASON FOR VISIT: follow up after left basilic vein transposition.  HPI: [name] is a 78 y.o. female who is not yet on dialysis. She had a basilic vein transposition performed on 07/04/2012. She comes in for a routine follow up visit. She's had no significant pain in the left arm and no paresthesias in the left hand.   REVIEW OF SYSTEMS: Arly.Keller ] denotes positive finding; [  ] denotes negative finding  CARDIOVASCULAR:  [ ]  chest pain   [ ]  dyspnea on exertion    CONSTITUTIONAL:  [ ]  fever   [ ]  chills  PHYSICAL EXAM: Filed Vitals:   08/06/12 1013  BP: 131/36  Pulse: 71  Temp: 97.5 F (36.4 C)  TempSrc: Oral  Height: 4\' 11"  (1.499 m)  Weight: 142 lb (64.411 kg)  SpO2: 100%   Body mass index is 28.68 kg/(m^2). GENERAL: The patient is a well-nourished female, in no acute distress. The vital signs are documented above. CARDIOVASCULAR: There is a regular rate and rhythm  PULMONARY: There is good air exchange bilaterally without wheezing or rales. Her fistula has an excellent thrill. The left hand is warm and well perfused. Her incisions are healing nicely.  MEDICAL ISSUES:  End stage renal disease Her left basilic vein transposition appears to be maturing nicely. I think it should be ready to access in approximately 2 months. We will see her back as needed.   DICKSON,CHRISTOPHER S [facility] [place] Beeper: [phone]

## 2012-08-06 NOTE — Assessment & Plan Note (Signed)
Her left basilic vein transposition appears to be maturing nicely. I think it should be ready to access in approximately 2 months. We will see her back as needed.

## 2012-08-13 ENCOUNTER — Encounter (HOSPITAL_COMMUNITY)
Admission: RE | Admit: 2012-08-13 | Discharge: 2012-08-13 | Disposition: A | Discharge status: Home / Self Care | Payer: Medicare Other | Source: Ambulatory Visit | Attending: Nephrology | Admitting: Nephrology

## 2012-08-13 DIAGNOSIS — D638 Anemia in other chronic diseases classified elsewhere: Secondary | ICD-10-CM | POA: Insufficient documentation

## 2012-08-13 DIAGNOSIS — N183 Chronic kidney disease, stage 3 unspecified: Secondary | ICD-10-CM | POA: Insufficient documentation

## 2012-08-13 MED ORDER — EPOETIN ALFA 10000 UNIT/ML IJ SOLN: 40000.0000 [IU] | INTRAMUSCULAR | Status: DC

## 2012-08-13 MED ORDER — EPOETIN ALFA 40000 UNIT/ML IJ SOLN
INTRAMUSCULAR | Status: AC
  Administered 2012-08-13: 40000 [IU] via SUBCUTANEOUS
  Filled 2012-08-13: qty 1

## 2012-08-19 ENCOUNTER — Other Ambulatory Visit: Payer: Self-pay | Admitting: Internal Medicine

## 2012-08-21 ENCOUNTER — Encounter (HOSPITAL_COMMUNITY)
Admission: RE | Admit: 2012-08-21 | Discharge: 2012-08-21 | Disposition: A | Discharge status: Home / Self Care | Payer: Medicare Other | Source: Ambulatory Visit | Attending: Nephrology | Admitting: Nephrology

## 2012-08-21 LAB — POCT HEMOGLOBIN-HEMACUE: Hemoglobin: 11.6 g/dL — ABNORMAL LOW (ref 12.0–15.0)

## 2012-08-21 LAB — IRON AND TIBC: TIBC: 251 ug/dL (ref 250–470)

## 2012-08-21 MED ORDER — EPOETIN ALFA 10000 UNIT/ML IJ SOLN: 40000.0000 [IU] | INTRAMUSCULAR | Status: DC

## 2012-08-22 ENCOUNTER — Encounter: Payer: Self-pay | Admitting: Internal Medicine

## 2012-08-22 ENCOUNTER — Ambulatory Visit (INDEPENDENT_AMBULATORY_CARE_PROVIDER_SITE_OTHER): Payer: Medicare Other | Admitting: Internal Medicine

## 2012-08-22 VITALS — BP 132/51 | HR 57 | Ht 59.0 in | Wt 141.0 lb

## 2012-08-22 DIAGNOSIS — R001 Bradycardia, unspecified: Secondary | ICD-10-CM

## 2012-08-22 DIAGNOSIS — I498 Other specified cardiac arrhythmias: Secondary | ICD-10-CM

## 2012-08-22 NOTE — Patient Instructions (Addendum)
Your physician has recommended you make the following change in your medication: STOP TOPROL   Your physician has recommended that you wear a 24 hour holter monitor.  DX:  BRADYCARDIA  Holter monitors are medical devices that record the heart's electrical activity. Doctors most often use these monitors to diagnose arrhythmias. Arrhythmias are problems with the speed or rhythm of the heartbeat. The monitor is a small, portable device. You can wear one while you do your normal daily activities. This is usually used to diagnose what is causing palpitations/syncope (passing out).

## 2012-08-22 NOTE — Progress Notes (Signed)
HPI Patient is an 78 year old with a history of restrictive cardiomyopathy, HTN, HL and renal insuff.  I saw hier in December 2012.  She has been seen in renal clinic and recently had a dialysis  Shunt put in   She follows with Dr. Caryn Section  Seen last week Since I saw her she says it was a diffiucult summer with surgery.  Slowly recovering.  Fluid was an issue initially  This has slowly improved.  Still feels full in neck but better.  Some SOB  Not at baseline.  No dizziness.  No appetite.   No Known Allergies  Current Outpatient Prescriptions  Medication Sig Dispense Refill  . acetaminophen (TYLENOL) 325 MG tablet Take 650 mg by mouth every 4 (four) hours as needed. For pain.      Marland Kitchen allopurinol (ZYLOPRIM) 100 MG tablet Take 100 mg by mouth every other day.      . calcitRIOL (ROCALTROL) 0.5 MCG capsule 1 tab on mon -wed -fri      . epoetin alfa (PROCRIT) 78469 UNIT/ML injection Inject 40,000 Units into the skin once a week.       . hydrocortisone (ANUSOL-HC) 25 MG suppository Place 25 mg rectally at bedtime as needed. For hemorrhoids. Use for 10 days.      Marland Kitchen levothyroxine (SYNTHROID, LEVOTHROID) 75 MCG tablet Take 75 mcg by mouth daily.      Marland Kitchen losartan (COZAAR) 100 MG tablet Take 50 mg by mouth daily.      . metoprolol succinate (TOPROL-XL) 50 MG 24 hr tablet Take 25 mg by mouth daily. For blood pressure.Take with or immediately following a meal.      . pantoprazole (PROTONIX) 40 MG tablet TAKE 1 TABLET BY MOUTH DAILY  60 tablet  2  . potassium chloride 40 MEQ/15ML (20%) LIQD TAKE 15 ML BY MOUTH TWICE DAILY  900 mL  1  . simvastatin (ZOCOR) 20 MG tablet Take 20 mg by mouth every evening.      . torsemide (DEMADEX) 20 MG tablet Take 7 tablets (140 mg total) by mouth daily.  210 tablet  0  . traMADol (ULTRAM) 50 MG tablet Take 0.5-1 tablets (25-50 mg total) by mouth every 8 (eight) hours as needed for pain. For arthritis pain.  30 tablet  0  . traMADol (ULTRAM) 50 MG tablet TAKE  1/2 TO 1 TABLET BY  MOUTH EVERY 8 TO 12 HOURS AS NEEDED  30 tablet  0  . vitamin C (ASCORBIC ACID) 500 MG tablet Take 500 mg by mouth daily.         No current facility-administered medications for this visit.   Facility-Administered Medications Ordered in Other Visits  Medication Dose Route Frequency Provider Last Rate Last Dose  . DISCONTD: epoetin alfa (EPOGEN,PROCRIT) injection 40,000 Units  40,000 Units Subcutaneous Q7 days Zada Girt, MD        Past Medical History  Diagnosis Date  . Restrictive cardiomyopathy   . Atrial fibrillation 1996    S/P cardioversion  . CVA (cerebral infarction) 1999  . CPAP (continuous positive airway pressure) dependence   . Anemia     NOS  . Iron deficiency   . Gout   . Peptic ulcer, acute with hemorrhage 2005, 2011  . Hypertension   . Hypothyroidism   . Diastolic dysfunction   . Adenomatous colon polyp   . GERD (gastroesophageal reflux disease)   . Hiatal hernia   . Diverticulosis   . Anxiety disorder   .  Arthritis   . Internal and external hemorrhoids without complication   . Morbid obesity   . CAD (coronary artery disease) 08/06/2008    One vessel by cath, EF overall preserved by echo 04/30/2008  . On home oxygen therapy   . Renal failure     Dr. Caryn Section  . Hyperlipidemia   . CHF (congestive heart failure)   . Complication of anesthesia     pt states she was sore "all-over" for over a week after anesthesia  . COAD (chronic obstructive airways disease) 2007    Exacerbation  . COPD (chronic obstructive pulmonary disease)     oxygen prn  . Sleep apnea     cpap - no longer uses  . Stroke     x2 -- effected vision; left sided slightly weak  . Rash     on upper extremities    Past Surgical History  Procedure Date  . Appendectomy   . Oophorectomy   . Incision and drainage of cellulitis 04/2008    Umbilical abcess  . Cardiac catheterization     Pulmonary HTN, non obstructive CAD  . Abdominal hysterectomy   . Mole removal     on leg    Family  History  Problem Relation Age of Onset  . Breast cancer Mother   . Cancer Mother     breast  . Heart disease Father     MI  . Heart attack Father   . Breast cancer Sister   . Cancer Sister     breast  . Peripheral vascular disease Sister   . Heart disease Brother     CABG 5 vessel  . Rheum arthritis Brother   . Colon cancer Neg Hx     History   Social History  . Marital Status: Married    Spouse Name: N/A    Number of Children: N/A  . Years of Education: N/A   Occupational History  . Retired Social worker   Social History Main Topics  . Smoking status: Never Smoker   . Smokeless tobacco: Never Used  . Alcohol Use: No  . Drug Use: No  . Sexually Active: Not on file   Other Topics Concern  . Not on file   Social History Narrative   MarriedOne childNo regular exerciseOxygen dependent Daily caffeine use: one daily07/19/2011 - Designated party form signed appointing spouse Ingrida Getting or daughter Precious Haws; OK to leave message on home answering machine at [phone]; [name] [phone]    Review of Systems:  All systems reviewed.  They are negative to the above problem except as previously stated.  Vital Signs: BP 132/51  Pulse 57  Ht 4\' 11"  (1.499 m)  Wt 141 lb (63.957 kg)  BMI 28.48 kg/m2  Physical Exam Patient is in NAD  Examined in chair  HEENT:  Normocephalic, atraumatic. EOMI, PERRLA.  Neck: JVP is increased  No bruits.  Lungs: clear to auscultation. No rales no wheezes.  Heart: Regular rate and rhythm. Normal S1, S2. No S3.   No significant murmurs. PMI not displaced.  Abdomen:  Supple, nontender. Normal bowel sounds  No hepatomegaly.  Extremities: . No lower extremity edema.  Musculoskeletal :moving all extremities.  Neuro:   alert and oriented x3.  CN II-XII grossly intact.  EKG:  Atrial fib with question junctional escape.  57 bpm.  RBBB.  Nonspecific ST T wave changes. Assessment and Plan:  1. Restrictive cardiomyopathy.  Volume  status may be up some.  I  will need to review labs  Reluctant to change meds now.  Get recent values from renal clinic.  2.  Atrial fibrillation.  EKG is very regular.  Will hol metoprolo  Get holtermonitor  3.  HL  Continue on statin  4.  Renal  Now with shunt  Will review labs.

## 2012-09-02 ENCOUNTER — Encounter (INDEPENDENT_AMBULATORY_CARE_PROVIDER_SITE_OTHER): Payer: Medicare Other

## 2012-09-02 DIAGNOSIS — I498 Other specified cardiac arrhythmias: Secondary | ICD-10-CM

## 2012-09-03 ENCOUNTER — Other Ambulatory Visit (HOSPITAL_COMMUNITY): Payer: Self-pay | Admitting: *Deleted

## 2012-09-04 ENCOUNTER — Encounter (HOSPITAL_COMMUNITY)
Admission: RE | Admit: 2012-09-04 | Discharge: 2012-09-04 | Disposition: A | Discharge status: Home / Self Care | Payer: Medicare Other | Source: Ambulatory Visit | Attending: Nephrology | Admitting: Nephrology

## 2012-09-04 LAB — POCT HEMOGLOBIN-HEMACUE: Hemoglobin: 11.8 g/dL — ABNORMAL LOW (ref 12.0–15.0)

## 2012-09-04 MED ORDER — EPOETIN ALFA 10000 UNIT/ML IJ SOLN: 40000.0000 [IU] | INTRAMUSCULAR | Status: DC

## 2012-09-08 ENCOUNTER — Other Ambulatory Visit: Payer: Self-pay | Admitting: Internal Medicine

## 2012-09-09 NOTE — Telephone Encounter (Signed)
TSH 244.9 

## 2012-09-11 ENCOUNTER — Encounter: Payer: Self-pay | Admitting: Internal Medicine

## 2012-09-17 ENCOUNTER — Other Ambulatory Visit (HOSPITAL_COMMUNITY): Payer: Self-pay | Admitting: *Deleted

## 2012-09-18 ENCOUNTER — Encounter (HOSPITAL_COMMUNITY)
Admission: RE | Admit: 2012-09-18 | Discharge: 2012-09-18 | Disposition: A | Discharge status: Home / Self Care | Payer: Medicare Other | Source: Ambulatory Visit | Attending: Nephrology | Admitting: Nephrology

## 2012-09-18 DIAGNOSIS — D638 Anemia in other chronic diseases classified elsewhere: Secondary | ICD-10-CM | POA: Insufficient documentation

## 2012-09-18 DIAGNOSIS — N183 Chronic kidney disease, stage 3 unspecified: Secondary | ICD-10-CM | POA: Insufficient documentation

## 2012-09-18 LAB — IRON AND TIBC
Saturation Ratios: 40 % (ref 20–55)
TIBC: 252 ug/dL (ref 250–470)

## 2012-09-18 MED ORDER — EPOETIN ALFA 40000 UNIT/ML IJ SOLN
INTRAMUSCULAR | Status: AC
  Administered 2012-09-18: 40000 [IU] via SUBCUTANEOUS
  Filled 2012-09-18: qty 1

## 2012-09-18 MED ORDER — EPOETIN ALFA 10000 UNIT/ML IJ SOLN: 40000.0000 [IU] | INTRAMUSCULAR | Status: DC

## 2012-09-25 ENCOUNTER — Encounter (HOSPITAL_COMMUNITY)
Admission: RE | Admit: 2012-09-25 | Discharge: 2012-09-25 | Disposition: A | Discharge status: Home / Self Care | Payer: Medicare Other | Source: Ambulatory Visit | Attending: Nephrology | Admitting: Nephrology

## 2012-09-25 MED ORDER — EPOETIN ALFA 40000 UNIT/ML IJ SOLN
INTRAMUSCULAR | Status: AC
  Administered 2012-09-25: 40000 [IU] via SUBCUTANEOUS
  Filled 2012-09-25: qty 1

## 2012-09-25 MED ORDER — EPOETIN ALFA 10000 UNIT/ML IJ SOLN: 40000.0000 [IU] | INTRAMUSCULAR | Status: DC

## 2012-10-02 ENCOUNTER — Encounter (HOSPITAL_COMMUNITY)
Admission: RE | Admit: 2012-10-02 | Discharge: 2012-10-02 | Disposition: A | Discharge status: Home / Self Care | Payer: Medicare Other | Source: Ambulatory Visit | Attending: Nephrology | Admitting: Nephrology

## 2012-10-02 LAB — BASIC METABOLIC PANEL
CO2: 29 mEq/L (ref 19–32)
Calcium: 10 mg/dL (ref 8.4–10.5)
Chloride: 99 mEq/L (ref 96–112)
Creatinine, Ser: 2.28 mg/dL — ABNORMAL HIGH (ref 0.50–1.10)
Glucose, Bld: 81 mg/dL (ref 70–99)

## 2012-10-02 MED ORDER — EPOETIN ALFA 40000 UNIT/ML IJ SOLN
INTRAMUSCULAR | Status: AC
  Administered 2012-10-02: 40000 [IU] via SUBCUTANEOUS
  Filled 2012-10-02: qty 1

## 2012-10-02 MED ORDER — EPOETIN ALFA 10000 UNIT/ML IJ SOLN: 40000.0000 [IU] | INTRAMUSCULAR | Status: DC

## 2012-10-08 ENCOUNTER — Other Ambulatory Visit: Payer: Self-pay | Admitting: Internal Medicine

## 2012-10-09 ENCOUNTER — Encounter (HOSPITAL_COMMUNITY)
Admission: RE | Admit: 2012-10-09 | Discharge: 2012-10-09 | Disposition: A | Discharge status: Home / Self Care | Payer: Medicare Other | Source: Ambulatory Visit | Attending: Nephrology | Admitting: Nephrology

## 2012-10-09 LAB — POCT HEMOGLOBIN-HEMACUE: Hemoglobin: 10.9 g/dL — ABNORMAL LOW (ref 12.0–15.0)

## 2012-10-09 MED ORDER — LEVOTHYROXINE SODIUM 75 MCG PO TABS: 75.0000 ug | ORAL_TABLET | Freq: Every day | ORAL | Status: DC

## 2012-10-09 MED ORDER — EPOETIN ALFA 40000 UNIT/ML IJ SOLN
INTRAMUSCULAR | Status: AC
  Administered 2012-10-09: 40000 [IU] via SUBCUTANEOUS
  Filled 2012-10-09: qty 1

## 2012-10-09 MED ORDER — EPOETIN ALFA 10000 UNIT/ML IJ SOLN
40000.0000 [IU] | INTRAMUSCULAR | Status: DC
Start: 2012-10-09 — End: 2012-10-10

## 2012-10-09 NOTE — Telephone Encounter (Signed)
Last OV 11/23/2011, Dr.Hopper please advise

## 2012-10-09 NOTE — Telephone Encounter (Signed)
refill also levothyroxine 0.075mg  ( ) tabs #30 Take one tablet by mouth daily last fill 10.1.13

## 2012-10-09 NOTE — Telephone Encounter (Signed)
Rx faxed to pharmacy.     MW

## 2012-10-09 NOTE — Telephone Encounter (Signed)
OK  For both X1 but additional refills require office visit to update medical history.

## 2012-10-09 NOTE — Telephone Encounter (Signed)
OK  For both X1 but additional refills require office visit to update medical history.  

## 2012-10-15 ENCOUNTER — Encounter (HOSPITAL_COMMUNITY)
Admission: RE | Admit: 2012-10-15 | Discharge: 2012-10-15 | Disposition: A | Discharge status: Home / Self Care | Payer: Medicare Other | Source: Ambulatory Visit | Attending: Nephrology | Admitting: Nephrology

## 2012-10-15 DIAGNOSIS — D638 Anemia in other chronic diseases classified elsewhere: Secondary | ICD-10-CM | POA: Insufficient documentation

## 2012-10-15 DIAGNOSIS — N183 Chronic kidney disease, stage 3 unspecified: Secondary | ICD-10-CM | POA: Insufficient documentation

## 2012-10-15 LAB — POCT HEMOGLOBIN-HEMACUE: Hemoglobin: 11.4 g/dL — ABNORMAL LOW (ref 12.0–15.0)

## 2012-10-15 LAB — IRON AND TIBC: UIBC: 195 ug/dL (ref 125–400)

## 2012-10-15 MED ORDER — EPOETIN ALFA 10000 UNIT/ML IJ SOLN: 40000.0000 [IU] | INTRAMUSCULAR | Status: DC

## 2012-10-16 ENCOUNTER — Encounter (HOSPITAL_COMMUNITY): Payer: Medicare Other

## 2012-10-19 ENCOUNTER — Other Ambulatory Visit: Payer: Self-pay | Admitting: Internal Medicine

## 2012-10-28 ENCOUNTER — Other Ambulatory Visit (HOSPITAL_COMMUNITY): Payer: Self-pay | Admitting: *Deleted

## 2012-10-29 ENCOUNTER — Encounter (HOSPITAL_COMMUNITY)
Admission: RE | Admit: 2012-10-29 | Discharge: 2012-10-29 | Disposition: A | Discharge status: Home / Self Care | Payer: Medicare Other | Source: Ambulatory Visit | Attending: Nephrology | Admitting: Nephrology

## 2012-10-29 LAB — RENAL FUNCTION PANEL
Albumin: 3.9 g/dL (ref 3.5–5.2)
BUN: 108 mg/dL — ABNORMAL HIGH (ref 6–23)
Chloride: 101 mEq/L (ref 96–112)
GFR calc Af Amer: 24 mL/min — ABNORMAL LOW (ref >90)
GFR calc non Af Amer: 20 mL/min — ABNORMAL LOW (ref >90)
Potassium: 4.3 mEq/L (ref 3.5–5.1)
Sodium: 142 mEq/L (ref 135–145)

## 2012-10-29 MED ORDER — EPOETIN ALFA 10000 UNIT/ML IJ SOLN: 40000.0000 [IU] | INTRAMUSCULAR | Status: DC

## 2012-10-30 LAB — PTH, INTACT AND CALCIUM
Calcium, Total (PTH): 9.9 mg/dL (ref 8.4–10.5)
PTH: 111 pg/mL — ABNORMAL HIGH (ref 14.0–72.0)

## 2012-11-05 ENCOUNTER — Other Ambulatory Visit: Payer: Self-pay | Admitting: Internal Medicine

## 2012-11-05 MED ORDER — TRAMADOL HCL 50 MG PO TABS: 25.0000 mg | ORAL_TABLET | Freq: Three times a day (TID) | ORAL | Status: DC | PRN

## 2012-11-05 NOTE — Telephone Encounter (Signed)
refill TraMADol HCl (Tab) 50 MG TAKE 1/2 TO 1 TABLET BY MOUTH EVERY 8-12 HOURS AS NEEDED # 30 lst fill 10.31.13--last ov 12.14.12

## 2012-11-07 ENCOUNTER — Other Ambulatory Visit: Payer: Self-pay

## 2012-11-11 ENCOUNTER — Other Ambulatory Visit (HOSPITAL_COMMUNITY): Payer: Self-pay | Admitting: *Deleted

## 2012-11-11 ENCOUNTER — Other Ambulatory Visit: Payer: Self-pay | Admitting: Internal Medicine

## 2012-11-11 MED ORDER — LEVOTHYROXINE SODIUM 75 MCG PO TABS: 75.0000 ug | ORAL_TABLET | Freq: Every day | ORAL | Status: DC

## 2012-11-11 NOTE — Telephone Encounter (Signed)
Rx sent.  Plz schedule pt appt.    MW

## 2012-11-11 NOTE — Telephone Encounter (Signed)
refill levothyroxine 0.075MG  ( ) Tabs Take one tablet by mouth daily last fill 10.31.13 #30, last ov 12.14.12 NO future appts scheduled

## 2012-11-11 NOTE — Telephone Encounter (Signed)
Letter mailed to patient.

## 2012-11-11 NOTE — Telephone Encounter (Deleted)
Duplicate request

## 2012-11-11 NOTE — Telephone Encounter (Signed)
SEE PREVIOUS NOTES MW

## 2012-11-11 NOTE — Telephone Encounter (Signed)
The phone number listed stated "We do not have anyone here by that name." Can you please mail patient a letter she needs a physical and we are scheduling in march now

## 2012-11-12 ENCOUNTER — Encounter (HOSPITAL_COMMUNITY)
Admission: RE | Admit: 2012-11-12 | Discharge: 2012-11-12 | Disposition: A | Discharge status: Home / Self Care | Payer: Medicare Other | Source: Ambulatory Visit | Attending: Nephrology | Admitting: Nephrology

## 2012-11-12 DIAGNOSIS — N183 Chronic kidney disease, stage 3 unspecified: Secondary | ICD-10-CM | POA: Insufficient documentation

## 2012-11-12 DIAGNOSIS — D638 Anemia in other chronic diseases classified elsewhere: Secondary | ICD-10-CM | POA: Insufficient documentation

## 2012-11-12 LAB — IRON AND TIBC
Iron: 89 ug/dL (ref 42–135)
TIBC: 266 ug/dL (ref 250–470)

## 2012-11-12 MED ORDER — EPOETIN ALFA 10000 UNIT/ML IJ SOLN: 40000.0000 [IU] | INTRAMUSCULAR | Status: DC

## 2012-11-12 MED ORDER — EPOETIN ALFA 40000 UNIT/ML IJ SOLN
INTRAMUSCULAR | Status: AC
  Administered 2012-11-12: 40000 [IU]
  Filled 2012-11-12: qty 1

## 2012-11-17 ENCOUNTER — Encounter (HOSPITAL_COMMUNITY): Payer: Medicare Other

## 2012-11-19 ENCOUNTER — Encounter (HOSPITAL_COMMUNITY)
Admission: RE | Admit: 2012-11-19 | Discharge: 2012-11-19 | Disposition: A | Discharge status: Home / Self Care | Payer: Medicare Other | Source: Ambulatory Visit | Attending: Nephrology | Admitting: Nephrology

## 2012-11-19 MED ORDER — EPOETIN ALFA 40000 UNIT/ML IJ SOLN
INTRAMUSCULAR | Status: AC
  Administered 2012-11-19: 40000 [IU] via SUBCUTANEOUS
  Filled 2012-11-19: qty 1

## 2012-11-19 MED ORDER — EPOETIN ALFA 10000 UNIT/ML IJ SOLN: 40000.0000 [IU] | INTRAMUSCULAR | Status: DC

## 2012-11-26 ENCOUNTER — Encounter (HOSPITAL_COMMUNITY)
Admission: RE | Admit: 2012-11-26 | Discharge: 2012-11-26 | Disposition: A | Discharge status: Home / Self Care | Payer: Medicare Other | Source: Ambulatory Visit | Attending: Nephrology | Admitting: Nephrology

## 2012-11-26 LAB — POCT HEMOGLOBIN-HEMACUE: Hemoglobin: 9.8 g/dL — ABNORMAL LOW (ref 12.0–15.0)

## 2012-11-26 MED ORDER — EPOETIN ALFA 40000 UNIT/ML IJ SOLN
INTRAMUSCULAR | Status: AC
  Administered 2012-11-26: 40000 [IU] via SUBCUTANEOUS
  Filled 2012-11-26: qty 1

## 2012-11-26 MED ORDER — EPOETIN ALFA 10000 UNIT/ML IJ SOLN: 40000.0000 [IU] | INTRAMUSCULAR | Status: DC

## 2012-12-04 ENCOUNTER — Other Ambulatory Visit (HOSPITAL_COMMUNITY): Payer: Self-pay | Admitting: *Deleted

## 2012-12-05 ENCOUNTER — Encounter (HOSPITAL_COMMUNITY)
Admission: RE | Admit: 2012-12-05 | Discharge: 2012-12-05 | Disposition: A | Discharge status: Home / Self Care | Payer: Medicare Other | Source: Ambulatory Visit | Attending: Nephrology | Admitting: Nephrology

## 2012-12-05 MED ORDER — EPOETIN ALFA 40000 UNIT/ML IJ SOLN
INTRAMUSCULAR | Status: AC
  Administered 2012-12-05: 40000 [IU] via SUBCUTANEOUS
  Filled 2012-12-05: qty 1

## 2012-12-05 MED ORDER — EPOETIN ALFA 10000 UNIT/ML IJ SOLN: 40000.0000 [IU] | INTRAMUSCULAR | Status: DC

## 2012-12-08 ENCOUNTER — Other Ambulatory Visit: Payer: Self-pay | Admitting: Internal Medicine

## 2012-12-09 NOTE — Telephone Encounter (Signed)
Patient needs to schedule Med Management appt, controlled substance form to be signed at appointment

## 2012-12-09 NOTE — Telephone Encounter (Signed)
Last filled 11/05/12 #30, last OV 11/23/11   Controlled Substance Form to be signed

## 2012-12-09 NOTE — Telephone Encounter (Signed)
#   15 ; needs appt before any more refills . Last seen 12/12

## 2012-12-12 ENCOUNTER — Encounter (HOSPITAL_COMMUNITY)
Admission: RE | Admit: 2012-12-12 | Discharge: 2012-12-12 | Disposition: A | Discharge status: Home / Self Care | Payer: Medicare Other | Source: Ambulatory Visit | Attending: Nephrology | Admitting: Nephrology

## 2012-12-12 DIAGNOSIS — N183 Chronic kidney disease, stage 3 unspecified: Secondary | ICD-10-CM | POA: Insufficient documentation

## 2012-12-12 DIAGNOSIS — D638 Anemia in other chronic diseases classified elsewhere: Secondary | ICD-10-CM | POA: Insufficient documentation

## 2012-12-12 LAB — IRON AND TIBC: UIBC: 199 ug/dL (ref 125–400)

## 2012-12-12 LAB — POCT HEMOGLOBIN-HEMACUE: Hemoglobin: 11.1 g/dL — ABNORMAL LOW (ref 12.0–15.0)

## 2012-12-12 MED ORDER — EPOETIN ALFA 10000 UNIT/ML IJ SOLN: 40000.0000 [IU] | INTRAMUSCULAR | Status: DC

## 2012-12-24 ENCOUNTER — Other Ambulatory Visit (HOSPITAL_COMMUNITY): Payer: Self-pay | Admitting: *Deleted

## 2012-12-25 ENCOUNTER — Encounter (HOSPITAL_COMMUNITY)
Admission: RE | Admit: 2012-12-25 | Discharge: 2012-12-25 | Disposition: A | Discharge status: Home / Self Care | Payer: Medicare Other | Source: Ambulatory Visit | Attending: Nephrology | Admitting: Nephrology

## 2012-12-25 MED ORDER — EPOETIN ALFA 10000 UNIT/ML IJ SOLN: 40000.0000 [IU] | INTRAMUSCULAR | Status: DC

## 2012-12-25 MED ORDER — EPOETIN ALFA 40000 UNIT/ML IJ SOLN
INTRAMUSCULAR | Status: AC
  Administered 2012-12-25: 40000 [IU] via SUBCUTANEOUS
  Filled 2012-12-25: qty 1

## 2013-01-01 ENCOUNTER — Other Ambulatory Visit: Payer: Self-pay | Admitting: Internal Medicine

## 2013-01-02 ENCOUNTER — Encounter (HOSPITAL_COMMUNITY)
Admission: RE | Admit: 2013-01-02 | Discharge: 2013-01-02 | Disposition: A | Discharge status: Home / Self Care | Payer: Medicare Other | Source: Ambulatory Visit | Attending: Nephrology | Admitting: Nephrology

## 2013-01-02 LAB — POCT HEMOGLOBIN-HEMACUE: Hemoglobin: 10.8 g/dL — ABNORMAL LOW (ref 12.0–15.0)

## 2013-01-02 MED ORDER — EPOETIN ALFA 10000 UNIT/ML IJ SOLN: 40000.0000 [IU] | INTRAMUSCULAR | Status: DC

## 2013-01-02 MED ORDER — EPOETIN ALFA 40000 UNIT/ML IJ SOLN
INTRAMUSCULAR | Status: AC
  Administered 2013-01-02: 40000 [IU] via SUBCUTANEOUS
  Filled 2013-01-02: qty 1

## 2013-01-08 ENCOUNTER — Other Ambulatory Visit: Payer: Self-pay | Admitting: Internal Medicine

## 2013-01-08 NOTE — Telephone Encounter (Signed)
Patient with pending appointment 03/2012

## 2013-01-09 ENCOUNTER — Encounter (HOSPITAL_COMMUNITY)
Admission: RE | Admit: 2013-01-09 | Discharge: 2013-01-09 | Disposition: A | Discharge status: Home / Self Care | Payer: Medicare Other | Source: Ambulatory Visit | Attending: Nephrology | Admitting: Nephrology

## 2013-01-09 LAB — POCT HEMOGLOBIN-HEMACUE: Hemoglobin: 10.9 g/dL — ABNORMAL LOW (ref 12.0–15.0)

## 2013-01-09 MED ORDER — EPOETIN ALFA 40000 UNIT/ML IJ SOLN
INTRAMUSCULAR | Status: AC
  Administered 2013-01-09: 40000 [IU] via SUBCUTANEOUS
  Filled 2013-01-09: qty 1

## 2013-01-09 MED ORDER — EPOETIN ALFA 10000 UNIT/ML IJ SOLN: 40000.0000 [IU] | INTRAMUSCULAR | Status: DC

## 2013-01-15 ENCOUNTER — Encounter (HOSPITAL_COMMUNITY)
Admission: RE | Admit: 2013-01-15 | Discharge: 2013-01-15 | Disposition: A | Discharge status: Home / Self Care | Payer: Medicare Other | Source: Ambulatory Visit | Attending: Nephrology | Admitting: Nephrology

## 2013-01-15 DIAGNOSIS — D638 Anemia in other chronic diseases classified elsewhere: Secondary | ICD-10-CM | POA: Insufficient documentation

## 2013-01-15 DIAGNOSIS — N183 Chronic kidney disease, stage 3 unspecified: Secondary | ICD-10-CM | POA: Insufficient documentation

## 2013-01-15 LAB — IRON AND TIBC
Iron: 58 ug/dL (ref 42–135)
Saturation Ratios: 20 % (ref 20–55)
TIBC: 297 ug/dL (ref 250–470)
UIBC: 239 ug/dL (ref 125–400)

## 2013-01-15 MED ORDER — EPOETIN ALFA 40000 UNIT/ML IJ SOLN
INTRAMUSCULAR | Status: AC
  Administered 2013-01-15: 40000 [IU] via SUBCUTANEOUS
  Filled 2013-01-15: qty 1

## 2013-01-15 MED ORDER — EPOETIN ALFA 10000 UNIT/ML IJ SOLN: 40000.0000 [IU] | INTRAMUSCULAR | Status: DC

## 2013-01-22 ENCOUNTER — Encounter (HOSPITAL_COMMUNITY): Payer: Medicare Other

## 2013-01-27 ENCOUNTER — Other Ambulatory Visit (HOSPITAL_COMMUNITY): Payer: Self-pay

## 2013-01-28 ENCOUNTER — Encounter (HOSPITAL_COMMUNITY)
Admission: RE | Admit: 2013-01-28 | Discharge: 2013-01-28 | Disposition: A | Discharge status: Home / Self Care | Payer: Medicare Other | Source: Ambulatory Visit | Attending: Nephrology | Admitting: Nephrology

## 2013-01-28 LAB — RENAL FUNCTION PANEL
BUN: 132 mg/dL — ABNORMAL HIGH (ref 6–23)
CO2: 31 mEq/L (ref 19–32)
Calcium: 9.7 mg/dL (ref 8.4–10.5)
GFR calc Af Amer: 22 mL/min — ABNORMAL LOW (ref >90)
Glucose, Bld: 84 mg/dL (ref 70–99)
Phosphorus: 6.3 mg/dL — ABNORMAL HIGH (ref 2.3–4.6)
Sodium: 144 mEq/L (ref 135–145)

## 2013-01-28 MED ORDER — EPOETIN ALFA 10000 UNIT/ML IJ SOLN
40000.0000 [IU] | INTRAMUSCULAR | Status: DC
Start: 2013-01-28 — End: 2013-01-29

## 2013-01-28 MED ORDER — EPOETIN ALFA 40000 UNIT/ML IJ SOLN
INTRAMUSCULAR | Status: AC
  Administered 2013-01-28: 40000 [IU] via SUBCUTANEOUS
  Filled 2013-01-28: qty 1

## 2013-01-28 MED ORDER — SODIUM CHLORIDE 0.9 % IV SOLN
INTRAVENOUS | Status: DC
  Administered 2013-01-28: 250 mL via INTRAVENOUS

## 2013-01-28 MED ORDER — FERUMOXYTOL INJECTION 510 MG/17 ML
INTRAVENOUS | Status: AC
  Administered 2013-01-28: 510 mg via INTRAVENOUS
  Filled 2013-01-28: qty 17

## 2013-01-28 MED ORDER — FERUMOXYTOL INJECTION 510 MG/17 ML
510.0000 mg | INTRAVENOUS | Status: DC
  Administered 2013-01-28: 510 mg via INTRAVENOUS

## 2013-01-29 LAB — PTH, INTACT AND CALCIUM: PTH: 192.2 pg/mL — ABNORMAL HIGH (ref 14.0–72.0)

## 2013-02-04 ENCOUNTER — Encounter (HOSPITAL_COMMUNITY)
Admission: RE | Admit: 2013-02-04 | Discharge: 2013-02-04 | Disposition: A | Discharge status: Home / Self Care | Payer: Medicare Other | Source: Ambulatory Visit | Attending: Nephrology | Admitting: Nephrology

## 2013-02-04 LAB — POCT HEMOGLOBIN-HEMACUE: Hemoglobin: 10.9 g/dL — ABNORMAL LOW (ref 12.0–15.0)

## 2013-02-04 MED ORDER — EPOETIN ALFA 10000 UNIT/ML IJ SOLN: 40000.0000 [IU] | INTRAMUSCULAR | Status: DC

## 2013-02-04 MED ORDER — SODIUM CHLORIDE 0.9 % IV SOLN
INTRAVENOUS | Status: DC
  Administered 2013-02-04: 250 mL via INTRAVENOUS

## 2013-02-04 MED ORDER — FERUMOXYTOL INJECTION 510 MG/17 ML
INTRAVENOUS | Status: AC
  Administered 2013-02-04: 510 mg via INTRAVENOUS
  Filled 2013-02-04: qty 17

## 2013-02-04 MED ORDER — FERUMOXYTOL INJECTION 510 MG/17 ML
510.0000 mg | INTRAVENOUS | Status: AC
  Administered 2013-02-04: 510 mg via INTRAVENOUS

## 2013-02-04 MED ORDER — EPOETIN ALFA 40000 UNIT/ML IJ SOLN
INTRAMUSCULAR | Status: AC
  Administered 2013-02-04: 40000 [IU] via SUBCUTANEOUS
  Filled 2013-02-04: qty 1

## 2013-02-05 ENCOUNTER — Other Ambulatory Visit: Payer: Self-pay | Admitting: Internal Medicine

## 2013-02-05 NOTE — Telephone Encounter (Signed)
Left message on VM informing patient to return call to confirm message received. Patient will need to stop by the office to sign contract and pick-up rx

## 2013-02-06 ENCOUNTER — Telehealth: Payer: Self-pay

## 2013-02-06 NOTE — Telephone Encounter (Signed)
RX and controlled substance contract placed at the front for patient to pick-up. Side Note: patient with pending appointment April 2014

## 2013-02-09 ENCOUNTER — Encounter (INDEPENDENT_AMBULATORY_CARE_PROVIDER_SITE_OTHER): Payer: Medicare Other

## 2013-02-09 ENCOUNTER — Ambulatory Visit (INDEPENDENT_AMBULATORY_CARE_PROVIDER_SITE_OTHER): Payer: Medicare Other | Admitting: Internal Medicine

## 2013-02-09 ENCOUNTER — Encounter: Payer: Self-pay | Admitting: Internal Medicine

## 2013-02-09 VITALS — BP 135/55 | HR 70 | Wt 140.0 lb

## 2013-02-09 DIAGNOSIS — I6529 Occlusion and stenosis of unspecified carotid artery: Secondary | ICD-10-CM

## 2013-02-09 DIAGNOSIS — I10 Essential (primary) hypertension: Secondary | ICD-10-CM

## 2013-02-09 NOTE — Patient Instructions (Addendum)
Labs today:  BMET and BNP.

## 2013-02-09 NOTE — Progress Notes (Signed)
HPI Patient is an 79 year old with a history of restrictive cardiomyopathy, HTN, HL and renal insuff. I saw hier in the Fall 2013.  She has been seen in renal clinic by Dr .Caryn Section  Getting EPO infusions The patient notes over the past couple weeks she has been more SOB and felt full in her neck  Thinks she has more fluid.  Denies CP    No Known Allergies  Current Outpatient Prescriptions  Medication Sig Dispense Refill  . acetaminophen (TYLENOL) 325 MG tablet Take 650 mg by mouth every 4 (four) hours as needed. For pain.      Marland Kitchen allopurinol (ZYLOPRIM) 100 MG tablet Take 100 mg by mouth every other day.      . calcitRIOL (ROCALTROL) 0.5 MCG capsule Take 0.5 mcg by mouth daily.       Marland Kitchen epoetin alfa (PROCRIT) 45409 UNIT/ML injection Inject 40,000 Units into the skin once a week.       . hydrocortisone (ANUSOL-HC) 25 MG suppository Place 25 mg rectally at bedtime as needed. For hemorrhoids. Use for 10 days.      Marland Kitchen levothyroxine (SYNTHROID, LEVOTHROID) 75 MCG tablet TAKE 1 TABLET BY MOUTH EVERY DAY  30 tablet  0  . losartan (COZAAR) 100 MG tablet Take 50 mg by mouth daily.      . metolazone (ZAROXOLYN) 2.5 MG tablet TAKE 1 TABLET BY MOUTH EVERY OTHER DAY AS NEEDED  30 tablet  0  . pantoprazole (PROTONIX) 40 MG tablet TAKE 1 TABLET BY MOUTH DAILY  60 tablet  0  . potassium chloride 40 MEQ/15ML (20%) LIQD TAKE BY MOUTH TWICE DAILY  900 mL  3  . simvastatin (ZOCOR) 20 MG tablet Take 20 mg by mouth every evening.      . torsemide (DEMADEX) 20 MG tablet 7 tabs po qd      . traMADol (ULTRAM) 50 MG tablet TAKE 1/2 TO 1 TABLET BY MOUTH EVERY 8-12 HOURS AS NEEDED  30 tablet  0  . vitamin C (ASCORBIC ACID) 500 MG tablet Take 500 mg by mouth daily.         No current facility-administered medications for this visit.    Past Medical History  Diagnosis Date  . Restrictive cardiomyopathy   . Atrial fibrillation 1996    S/P cardioversion  . CVA (cerebral infarction) 1999  . CPAP (continuous positive  airway pressure) dependence   . Anemia     NOS  . Iron deficiency   . Gout   . Peptic ulcer, acute with hemorrhage 2005, 2011  . Hypertension   . Hypothyroidism   . Diastolic dysfunction   . Adenomatous colon polyp   . GERD (gastroesophageal reflux disease)   . Hiatal hernia   . Diverticulosis   . Anxiety disorder   . Arthritis   . Internal and external hemorrhoids without complication   . Morbid obesity   . CAD (coronary artery disease) 08/06/2008    One vessel by cath, EF overall preserved by echo 04/30/2008  . On home oxygen therapy   . Renal failure     Dr. Caryn Section  . Hyperlipidemia   . CHF (congestive heart failure)   . Complication of anesthesia     pt states she was sore "all-over" for over a week after anesthesia  . COAD (chronic obstructive airways disease) 2007    Exacerbation  . COPD (chronic obstructive pulmonary disease)     oxygen prn  . Sleep apnea  cpap - no longer uses  . Stroke     x2 -- effected vision; left sided slightly weak  . Rash     on upper extremities    Past Surgical History  Procedure Laterality Date  . Appendectomy    . Oophorectomy    . Incision and drainage of cellulitis  04/2008    Umbilical abcess  . Cardiac catheterization      Pulmonary HTN, non obstructive CAD  . Abdominal hysterectomy    . Mole removal      on leg    Family History  Problem Relation Age of Onset  . Breast cancer Mother   . Cancer Mother     breast  . Heart disease Father     MI  . Heart attack Father   . Breast cancer Sister   . Cancer Sister     breast  . Peripheral vascular disease Sister   . Heart disease Brother     CABG 5 vessel  . Rheum arthritis Brother   . Colon cancer Neg Hx     History   Social History  . Marital Status: Married    Spouse Name: N/A    Number of Children: N/A  . Years of Education: N/A   Occupational History  . Retired Social worker   Social History Main Topics  . Smoking status: Never Smoker   .  Smokeless tobacco: Never Used  . Alcohol Use: No  . Drug Use: No  . Sexually Active: Not on file   Other Topics Concern  . Not on file   Social History Narrative   Married   One child   No regular exercise   Oxygen dependent    Daily caffeine use: one daily      06/27/2010 - Designated party form signed appointing spouse Lynnann Knudsen or daughter Precious Haws; OK to leave message on home answering machine at [phone]; [name] [phone]    Review of Systems:  All systems reviewed.  They are negative to the above problem except as previously stated.  Vital Signs: BP 135/55  Pulse 70  Wt 140 lb (63.504 kg)  BMI 28.26 kg/m2  Physical Exam Patient is in NAD HEENT:  Normocephalic, atraumatic. EOMI, PERRLA.  Neck: JVP is increased.  No bruits.  Lungs: clear to auscultation. No rales no wheezes.  Heart: Regular rate and rhythm. Normal S1, S2. No S3.   No significant murmurs. PMI not displaced.  Abdomen:  Supple, nontender. Normal bowel sounds. No masses. No hepatomegaly.  Extremities:   Good distal pulses throughout. 1+ lower extremity edema.  Musculoskeletal :moving all extremities.  Neuro:   alert and oriented x3.  CN II-XII grossly intact.   Assessment and Plan:  Patient is an 79 yo with a restrictive CM and CRI  Now with some increased volume on exam I would recomm getting BMET and BNP  WIll need to dose diuretics based on results.

## 2013-02-10 ENCOUNTER — Other Ambulatory Visit: Payer: Self-pay | Admitting: *Deleted

## 2013-02-10 ENCOUNTER — Other Ambulatory Visit (HOSPITAL_COMMUNITY): Payer: Self-pay | Admitting: *Deleted

## 2013-02-10 ENCOUNTER — Telehealth: Payer: Self-pay | Admitting: Internal Medicine

## 2013-02-10 LAB — BASIC METABOLIC PANEL
CO2: 33 mEq/L — ABNORMAL HIGH (ref 19–32)
Calcium: 10.5 mg/dL (ref 8.4–10.5)
Chloride: 98 mEq/L (ref 96–112)
Creatinine, Ser: 2.5 mg/dL — ABNORMAL HIGH (ref 0.4–1.2)
Glucose, Bld: 79 mg/dL (ref 70–99)
Sodium: 146 mEq/L — ABNORMAL HIGH (ref 135–145)

## 2013-02-10 MED ORDER — TORSEMIDE 20 MG PO TABS: 20.0000 mg | ORAL_TABLET | ORAL | Status: DC

## 2013-02-10 NOTE — Telephone Encounter (Signed)
Pt needs her fluid pill Torsimide sent into [facility] on [address] pt was in yesterday and she was expecting it to be at [facility] and it is not there yet

## 2013-02-11 ENCOUNTER — Encounter (HOSPITAL_COMMUNITY)
Admission: RE | Admit: 2013-02-11 | Discharge: 2013-02-11 | Disposition: A | Discharge status: Home / Self Care | Payer: Medicare Other | Source: Ambulatory Visit | Attending: Nephrology | Admitting: Nephrology

## 2013-02-11 DIAGNOSIS — D638 Anemia in other chronic diseases classified elsewhere: Secondary | ICD-10-CM | POA: Insufficient documentation

## 2013-02-11 DIAGNOSIS — N183 Chronic kidney disease, stage 3 unspecified: Secondary | ICD-10-CM | POA: Insufficient documentation

## 2013-02-11 LAB — POCT HEMOGLOBIN-HEMACUE: Hemoglobin: 11.8 g/dL — ABNORMAL LOW (ref 12.0–15.0)

## 2013-02-11 LAB — IRON AND TIBC
Iron: 91 ug/dL (ref 42–135)
TIBC: 280 ug/dL (ref 250–470)
UIBC: 189 ug/dL (ref 125–400)

## 2013-02-11 LAB — URIC ACID: Uric Acid, Serum: 11.1 mg/dL — ABNORMAL HIGH (ref 2.4–7.0)

## 2013-02-11 MED ORDER — EPOETIN ALFA 10000 UNIT/ML IJ SOLN: 40000.0000 [IU] | INTRAMUSCULAR | Status: DC

## 2013-02-24 ENCOUNTER — Other Ambulatory Visit (HOSPITAL_COMMUNITY): Payer: Self-pay | Admitting: *Deleted

## 2013-02-25 ENCOUNTER — Encounter (HOSPITAL_COMMUNITY)
Admission: RE | Admit: 2013-02-25 | Discharge: 2013-02-25 | Disposition: A | Discharge status: Home / Self Care | Payer: Medicare Other | Source: Ambulatory Visit | Attending: Nephrology | Admitting: Nephrology

## 2013-02-25 MED ORDER — EPOETIN ALFA 10000 UNIT/ML IJ SOLN: 40000.0000 [IU] | INTRAMUSCULAR | Status: DC

## 2013-03-09 ENCOUNTER — Other Ambulatory Visit: Payer: Self-pay | Admitting: *Deleted

## 2013-03-09 ENCOUNTER — Other Ambulatory Visit: Payer: Self-pay | Admitting: Internal Medicine

## 2013-03-09 MED ORDER — SIMVASTATIN 20 MG PO TABS: 20.0000 mg | ORAL_TABLET | Freq: Every evening | ORAL | Status: DC

## 2013-03-11 ENCOUNTER — Encounter (HOSPITAL_COMMUNITY)
Admission: RE | Admit: 2013-03-11 | Discharge: 2013-03-11 | Disposition: A | Discharge status: Home / Self Care | Payer: Medicare Other | Source: Ambulatory Visit | Attending: Nephrology | Admitting: Nephrology

## 2013-03-11 DIAGNOSIS — N183 Chronic kidney disease, stage 3 unspecified: Secondary | ICD-10-CM | POA: Insufficient documentation

## 2013-03-11 DIAGNOSIS — D638 Anemia in other chronic diseases classified elsewhere: Secondary | ICD-10-CM | POA: Insufficient documentation

## 2013-03-11 LAB — IRON AND TIBC
Iron: 97 ug/dL (ref 42–135)
TIBC: 226 ug/dL — ABNORMAL LOW (ref 250–470)
UIBC: 129 ug/dL (ref 125–400)

## 2013-03-11 LAB — BASIC METABOLIC PANEL
Calcium: 9.2 mg/dL (ref 8.4–10.5)
GFR calc Af Amer: 21 mL/min — ABNORMAL LOW (ref >90)
GFR calc non Af Amer: 18 mL/min — ABNORMAL LOW (ref >90)
Sodium: 141 mEq/L (ref 135–145)

## 2013-03-11 LAB — POCT HEMOGLOBIN-HEMACUE: Hemoglobin: 10.5 g/dL — ABNORMAL LOW (ref 12.0–15.0)

## 2013-03-11 MED ORDER — EPOETIN ALFA 40000 UNIT/ML IJ SOLN
INTRAMUSCULAR | Status: AC
  Administered 2013-03-11: 40000 [IU] via SUBCUTANEOUS
  Filled 2013-03-11: qty 1

## 2013-03-11 MED ORDER — EPOETIN ALFA 10000 UNIT/ML IJ SOLN: 40000.0000 [IU] | INTRAMUSCULAR | Status: DC

## 2013-03-13 ENCOUNTER — Encounter: Payer: Self-pay | Admitting: Internal Medicine

## 2013-03-13 ENCOUNTER — Ambulatory Visit (INDEPENDENT_AMBULATORY_CARE_PROVIDER_SITE_OTHER): Payer: Medicare Other | Admitting: Internal Medicine

## 2013-03-13 VITALS — BP 124/58 | HR 69 | Temp 97.4°F | Resp 12 | Ht <= 58 in | Wt 139.0 lb

## 2013-03-13 DIAGNOSIS — M2550 Pain in unspecified joint: Secondary | ICD-10-CM

## 2013-03-13 DIAGNOSIS — E039 Hypothyroidism, unspecified: Secondary | ICD-10-CM

## 2013-03-13 LAB — TSH: TSH: 1.26 u[IU]/mL (ref 0.35–5.50)

## 2013-03-13 MED ORDER — TRAMADOL HCL 50 MG PO TABS: ORAL_TABLET | ORAL | Status: DC

## 2013-03-13 MED ORDER — LEVOTHYROXINE SODIUM 75 MCG PO TABS: ORAL_TABLET | ORAL | Status: DC

## 2013-03-13 NOTE — Patient Instructions (Addendum)
Please review the medication list in the After Visit Summary provided.Please verify the medication name (this may be  brand or generic) & correct dosage. Write the name of the prescribing physician to the right of the medication and share this with all medical staff seen at each appointment. This will help provide continuity of care; help optimize therapeutic interventions;and help prevent drug:drug adverse reaction. Share results with all non [facility] medical staff seen  

## 2013-03-13 NOTE — Progress Notes (Signed)
  Subjective:    Patient ID: [name], female    DOB: 01/20/1929, 79 y.o.   MRN: [id number]  HPI Patient is here to monitor thyroid status & Tramadol refill There has been no change in the dose brand or mode of administration of thyroid supplement No recent TSH  .  She takes tramadol on average one-2 per day for arthralgias, especially in her shoulders. Her gout has been quiescent  She is followed by Dr. Caryn Section for renal insufficiency. Her most recent creatinine was  2.37, BUN 122, and GFR 18. She does have associated anemia; hemoglobin was 10.5.      Review of Systems Constitutional: Weight down 60 # in past 2 years.Significant fatigue.Frequent sleep disruption.No change in appetite. Eye: Some blurred vision . No double or loss of vision Cardiovascular: Occasionally "beats hard" .No racing; irregularity ENT/GI: Chronic constipation. No diarrhea;hoarseness;dysphagia Derm: no change in nails,hair,skin Neuro: Occasional upper extremity numbness or tingling. No tremor Psych:no anxiety; depression; panic attacks Endo: Temperature intolerance to cold       Objective:   Physical Exam Gen.:  Appears weak but adequately nourished; in no acute distress Eyes: Extraocular motion intact; no lid lag or proptosis ,nystagmus Neck: full ROM; no masses ; thyroid normal  Heart: Normal rhythm and rate without  gallop or extra heart sounds.Grade 1/6 systolic murmur Lungs: Chest essentially clear to auscultation . Minor basilar rales ; no  wheezes Neuro:Deep tendon reflexes are asymmetric @ knees (R 0+)  ; no tremor  Skin: Warm and dry without significant lesions or rashes; no onycholysis Lymphatic: no cervical or axillary LA  Musculoskeletal: Pain with rot shoulders with suggestion of crepitus. Marked crepitus the right knee without effusionation .  1/2+ edema of the lower extremities at the sock line  Psych: Normally communicative and interactive; no abnormal mood or affect clinically.            Assessment & Plan:    #1 hypothyroidism; TSH needs to be updated  #2 polyarthralgias, degenerative. No exacerbation of gout recently. Good control with low dose tramadol.  Plan see orders/ recommendations

## 2013-03-19 ENCOUNTER — Encounter (HOSPITAL_COMMUNITY)
Admission: RE | Admit: 2013-03-19 | Discharge: 2013-03-19 | Disposition: A | Discharge status: Home / Self Care | Payer: Medicare Other | Source: Ambulatory Visit | Attending: Nephrology | Admitting: Nephrology

## 2013-03-19 MED ORDER — EPOETIN ALFA 40000 UNIT/ML IJ SOLN
INTRAMUSCULAR | Status: AC
  Administered 2013-03-19: 40000 [IU] via SUBCUTANEOUS
  Filled 2013-03-19: qty 1

## 2013-03-19 MED ORDER — EPOETIN ALFA 10000 UNIT/ML IJ SOLN: 40000.0000 [IU] | INTRAMUSCULAR | Status: DC

## 2013-03-25 ENCOUNTER — Encounter (HOSPITAL_COMMUNITY)
Admission: RE | Admit: 2013-03-25 | Discharge: 2013-03-25 | Disposition: A | Discharge status: Home / Self Care | Payer: Medicare Other | Source: Ambulatory Visit | Attending: Nephrology | Admitting: Nephrology

## 2013-03-25 MED ORDER — EPOETIN ALFA 10000 UNIT/ML IJ SOLN: 40000.0000 [IU] | INTRAMUSCULAR | Status: DC

## 2013-03-26 ENCOUNTER — Telehealth: Payer: Self-pay | Admitting: Internal Medicine

## 2013-03-26 NOTE — Telephone Encounter (Signed)
Pt has c/o muscle aches and has held her simvastatin. She now has less discomfort. Please review and advise. Pt aware she may not hear back till next week and agreed to plan.

## 2013-03-26 NOTE — Telephone Encounter (Signed)
New Problem:    Patient called in wanting to know if she would be able to take something else in place of her simvastatin (ZOCOR) 20 MG tablet.  Please call back.

## 2013-04-01 NOTE — Telephone Encounter (Signed)
Check fasting lipids after 2  Months off of statin to reassess.

## 2013-04-01 NOTE — Telephone Encounter (Signed)
Will forward to Dr. Ross for review. 

## 2013-04-08 ENCOUNTER — Other Ambulatory Visit: Payer: Self-pay | Admitting: *Deleted

## 2013-04-08 ENCOUNTER — Encounter (HOSPITAL_COMMUNITY): Payer: Medicare Other

## 2013-04-08 DIAGNOSIS — E785 Hyperlipidemia, unspecified: Secondary | ICD-10-CM

## 2013-04-08 NOTE — Telephone Encounter (Signed)
Patient notified and lipid panel ordered and scheduled.

## 2013-04-10 ENCOUNTER — Encounter (HOSPITAL_COMMUNITY)
Admission: RE | Admit: 2013-04-10 | Discharge: 2013-04-10 | Disposition: A | Discharge status: Home / Self Care | Payer: Medicare Other | Source: Ambulatory Visit | Attending: Nephrology | Admitting: Nephrology

## 2013-04-10 DIAGNOSIS — N183 Chronic kidney disease, stage 3 unspecified: Secondary | ICD-10-CM | POA: Insufficient documentation

## 2013-04-10 DIAGNOSIS — D638 Anemia in other chronic diseases classified elsewhere: Secondary | ICD-10-CM | POA: Insufficient documentation

## 2013-04-10 MED ORDER — EPOETIN ALFA 10000 UNIT/ML IJ SOLN: 40000.0000 [IU] | INTRAMUSCULAR | Status: DC

## 2013-04-11 ENCOUNTER — Other Ambulatory Visit: Payer: Self-pay | Admitting: Internal Medicine

## 2013-04-22 ENCOUNTER — Encounter (HOSPITAL_COMMUNITY)
Admission: RE | Admit: 2013-04-22 | Discharge: 2013-04-22 | Disposition: A | Discharge status: Home / Self Care | Payer: Medicare Other | Source: Ambulatory Visit | Attending: Nephrology | Admitting: Nephrology

## 2013-04-22 LAB — POCT HEMOGLOBIN-HEMACUE: Hemoglobin: 10 g/dL — ABNORMAL LOW (ref 12.0–15.0)

## 2013-04-22 MED ORDER — EPOETIN ALFA 10000 UNIT/ML IJ SOLN
40000.0000 [IU] | INTRAMUSCULAR | Status: DC
Start: 2013-04-22 — End: 2013-04-23

## 2013-04-22 MED ORDER — EPOETIN ALFA 40000 UNIT/ML IJ SOLN
INTRAMUSCULAR | Status: AC
  Administered 2013-04-22: 40000 [IU] via SUBCUTANEOUS
  Filled 2013-04-22: qty 1

## 2013-04-29 ENCOUNTER — Encounter (HOSPITAL_COMMUNITY)
Admission: RE | Admit: 2013-04-29 | Discharge: 2013-04-29 | Disposition: A | Discharge status: Home / Self Care | Payer: Medicare Other | Source: Ambulatory Visit | Attending: Nephrology | Admitting: Nephrology

## 2013-04-29 MED ORDER — EPOETIN ALFA 40000 UNIT/ML IJ SOLN
INTRAMUSCULAR | Status: AC
  Administered 2013-04-29: 40000 [IU] via SUBCUTANEOUS
  Filled 2013-04-29: qty 1

## 2013-04-29 MED ORDER — EPOETIN ALFA 10000 UNIT/ML IJ SOLN
40000.0000 [IU] | INTRAMUSCULAR | Status: DC
Start: 2013-04-29 — End: 2013-04-30

## 2013-05-05 ENCOUNTER — Other Ambulatory Visit (HOSPITAL_COMMUNITY): Payer: Self-pay

## 2013-05-06 ENCOUNTER — Encounter (HOSPITAL_COMMUNITY)
Admission: RE | Admit: 2013-05-06 | Discharge: 2013-05-06 | Disposition: A | Discharge status: Home / Self Care | Payer: Medicare Other | Source: Ambulatory Visit | Attending: Nephrology | Admitting: Nephrology

## 2013-05-06 LAB — IRON AND TIBC
Iron: 95 ug/dL (ref 42–135)
Saturation Ratios: 39 % (ref 20–55)
TIBC: 245 ug/dL — ABNORMAL LOW (ref 250–470)
UIBC: 150 ug/dL (ref 125–400)

## 2013-05-06 LAB — RENAL FUNCTION PANEL
BUN: 105 mg/dL — ABNORMAL HIGH (ref 6–23)
Calcium: 10.9 mg/dL — ABNORMAL HIGH (ref 8.4–10.5)
Creatinine, Ser: 3.05 mg/dL — ABNORMAL HIGH (ref 0.50–1.10)
Glucose, Bld: 103 mg/dL — ABNORMAL HIGH (ref 70–99)
Phosphorus: 6 mg/dL — ABNORMAL HIGH (ref 2.3–4.6)

## 2013-05-06 MED ORDER — EPOETIN ALFA 10000 UNIT/ML IJ SOLN
40000.0000 [IU] | INTRAMUSCULAR | Status: DC
Start: 2013-05-06 — End: 2013-05-07

## 2013-05-07 ENCOUNTER — Other Ambulatory Visit: Payer: Self-pay | Admitting: Internal Medicine

## 2013-05-19 ENCOUNTER — Other Ambulatory Visit (HOSPITAL_COMMUNITY): Payer: Self-pay | Admitting: *Deleted

## 2013-05-20 ENCOUNTER — Encounter (HOSPITAL_COMMUNITY)
Admission: RE | Admit: 2013-05-20 | Discharge: 2013-05-20 | Disposition: A | Discharge status: Home / Self Care | Payer: Medicare Other | Source: Ambulatory Visit | Attending: Nephrology | Admitting: Nephrology

## 2013-05-20 DIAGNOSIS — N183 Chronic kidney disease, stage 3 unspecified: Secondary | ICD-10-CM | POA: Insufficient documentation

## 2013-05-20 DIAGNOSIS — D638 Anemia in other chronic diseases classified elsewhere: Secondary | ICD-10-CM | POA: Insufficient documentation

## 2013-05-20 LAB — RENAL FUNCTION PANEL
BUN: 145 mg/dL — ABNORMAL HIGH (ref 6–23)
CO2: 30 mEq/L (ref 19–32)
Chloride: 94 mEq/L — ABNORMAL LOW (ref 96–112)
Creatinine, Ser: 2.53 mg/dL — ABNORMAL HIGH (ref 0.50–1.10)
Potassium: 3.3 mEq/L — ABNORMAL LOW (ref 3.5–5.1)

## 2013-05-20 LAB — POCT HEMOGLOBIN-HEMACUE: Hemoglobin: 10.9 g/dL — ABNORMAL LOW (ref 12.0–15.0)

## 2013-05-20 MED ORDER — EPOETIN ALFA 40000 UNIT/ML IJ SOLN
INTRAMUSCULAR | Status: AC
  Administered 2013-05-20: 40000 [IU] via SUBCUTANEOUS
  Filled 2013-05-20: qty 1

## 2013-05-20 MED ORDER — EPOETIN ALFA 10000 UNIT/ML IJ SOLN: 40000.0000 [IU] | INTRAMUSCULAR | Status: DC

## 2013-05-22 ENCOUNTER — Other Ambulatory Visit: Payer: Self-pay | Admitting: Internal Medicine

## 2013-05-25 ENCOUNTER — Other Ambulatory Visit: Payer: Medicare Other

## 2013-05-28 ENCOUNTER — Encounter (HOSPITAL_COMMUNITY)
Admission: RE | Admit: 2013-05-28 | Discharge: 2013-05-28 | Disposition: A | Discharge status: Home / Self Care | Payer: Medicare Other | Source: Ambulatory Visit | Attending: Nephrology | Admitting: Nephrology

## 2013-05-28 MED ORDER — EPOETIN ALFA 10000 UNIT/ML IJ SOLN: 40000.0000 [IU] | INTRAMUSCULAR | Status: DC

## 2013-05-28 MED ORDER — EPOETIN ALFA 40000 UNIT/ML IJ SOLN
INTRAMUSCULAR | Status: AC
  Administered 2013-05-28: 40000 [IU]
  Filled 2013-05-28: qty 1

## 2013-05-29 ENCOUNTER — Other Ambulatory Visit (INDEPENDENT_AMBULATORY_CARE_PROVIDER_SITE_OTHER): Payer: Medicare Other

## 2013-05-29 ENCOUNTER — Ambulatory Visit: Payer: Medicare Other | Admitting: Physician Assistant

## 2013-05-29 DIAGNOSIS — E785 Hyperlipidemia, unspecified: Secondary | ICD-10-CM

## 2013-05-29 LAB — LIPID PANEL
Cholesterol: 146 mg/dL (ref 0–200)
LDL Cholesterol: 58 mg/dL (ref 0–99)
Triglycerides: 95 mg/dL (ref 0.0–149.0)
VLDL: 19 mg/dL (ref 0.0–40.0)

## 2013-06-03 ENCOUNTER — Other Ambulatory Visit (HOSPITAL_COMMUNITY): Payer: Self-pay | Admitting: *Deleted

## 2013-06-04 ENCOUNTER — Encounter (HOSPITAL_COMMUNITY)
Admission: RE | Admit: 2013-06-04 | Discharge: 2013-06-04 | Disposition: A | Discharge status: Home / Self Care | Payer: Medicare Other | Source: Ambulatory Visit | Attending: Nephrology | Admitting: Nephrology

## 2013-06-04 LAB — IRON AND TIBC
TIBC: 230 ug/dL — ABNORMAL LOW (ref 250–470)
UIBC: 155 ug/dL (ref 125–400)

## 2013-06-04 MED ORDER — EPOETIN ALFA 10000 UNIT/ML IJ SOLN: 40000.0000 [IU] | INTRAMUSCULAR | Status: DC

## 2013-06-08 ENCOUNTER — Other Ambulatory Visit: Payer: Self-pay | Admitting: Internal Medicine

## 2013-06-10 ENCOUNTER — Encounter: Payer: Self-pay | Admitting: Nephrology

## 2013-06-11 ENCOUNTER — Telehealth: Payer: Self-pay | Admitting: Internal Medicine

## 2013-06-11 MED ORDER — POTASSIUM CHLORIDE 40 MEQ/15ML (20%) PO LIQD: ORAL | Status: DC

## 2013-06-11 NOTE — Telephone Encounter (Signed)
REFILL:POTASSIUM CHLOR 20% LIQ(40MEQ/15ML) QTY:946 TAKE BY MOUTH TWICE DAILY. LAST REFILL 04/20/2013.

## 2013-06-18 ENCOUNTER — Encounter (HOSPITAL_COMMUNITY)
Admission: RE | Admit: 2013-06-18 | Discharge: 2013-06-18 | Disposition: A | Discharge status: Home / Self Care | Payer: Medicare Other | Source: Ambulatory Visit | Attending: Nephrology | Admitting: Nephrology

## 2013-06-18 DIAGNOSIS — N183 Chronic kidney disease, stage 3 unspecified: Secondary | ICD-10-CM | POA: Insufficient documentation

## 2013-06-18 DIAGNOSIS — D638 Anemia in other chronic diseases classified elsewhere: Secondary | ICD-10-CM | POA: Insufficient documentation

## 2013-06-18 MED ORDER — EPOETIN ALFA 10000 UNIT/ML IJ SOLN: 40000.0000 [IU] | INTRAMUSCULAR | Status: DC

## 2013-06-18 MED ORDER — EPOETIN ALFA 40000 UNIT/ML IJ SOLN
INTRAMUSCULAR | Status: AC
  Administered 2013-06-18: 40000 [IU] via SUBCUTANEOUS
  Filled 2013-06-18: qty 1

## 2013-06-25 ENCOUNTER — Encounter (HOSPITAL_COMMUNITY)
Admission: RE | Admit: 2013-06-25 | Discharge: 2013-06-25 | Disposition: A | Discharge status: Home / Self Care | Payer: Medicare Other | Source: Ambulatory Visit | Attending: Nephrology | Admitting: Nephrology

## 2013-06-25 LAB — POCT HEMOGLOBIN-HEMACUE: Hemoglobin: 10.5 g/dL — ABNORMAL LOW (ref 12.0–15.0)

## 2013-06-25 MED ORDER — EPOETIN ALFA 40000 UNIT/ML IJ SOLN
INTRAMUSCULAR | Status: AC
  Administered 2013-06-25: 40000 [IU] via SUBCUTANEOUS
  Filled 2013-06-25: qty 1

## 2013-06-25 MED ORDER — EPOETIN ALFA 10000 UNIT/ML IJ SOLN: 40000.0000 [IU] | INTRAMUSCULAR | Status: DC

## 2013-06-27 ENCOUNTER — Other Ambulatory Visit: Payer: Self-pay | Admitting: Internal Medicine

## 2013-07-02 ENCOUNTER — Encounter (HOSPITAL_COMMUNITY)
Admission: RE | Admit: 2013-07-02 | Discharge: 2013-07-02 | Disposition: A | Discharge status: Home / Self Care | Payer: Medicare Other | Source: Ambulatory Visit | Attending: Nephrology | Admitting: Nephrology

## 2013-07-02 LAB — IRON AND TIBC
Iron: 56 ug/dL (ref 42–135)
UIBC: 154 ug/dL (ref 125–400)

## 2013-07-02 MED ORDER — EPOETIN ALFA 10000 UNIT/ML IJ SOLN: 40000.0000 [IU] | INTRAMUSCULAR | Status: DC

## 2013-07-02 MED ORDER — EPOETIN ALFA 40000 UNIT/ML IJ SOLN
INTRAMUSCULAR | Status: AC
  Administered 2013-07-02: 40000 [IU] via SUBCUTANEOUS
  Filled 2013-07-02: qty 1

## 2013-07-07 ENCOUNTER — Other Ambulatory Visit: Payer: Self-pay | Admitting: Internal Medicine

## 2013-07-07 NOTE — Telephone Encounter (Signed)
Checking Torsemide dose.

## 2013-07-08 ENCOUNTER — Other Ambulatory Visit (HOSPITAL_COMMUNITY): Payer: Self-pay

## 2013-07-09 ENCOUNTER — Encounter (HOSPITAL_COMMUNITY)
Admission: RE | Admit: 2013-07-09 | Discharge: 2013-07-09 | Disposition: A | Discharge status: Home / Self Care | Payer: Medicare Other | Source: Ambulatory Visit | Attending: Nephrology | Admitting: Nephrology

## 2013-07-09 LAB — POCT HEMOGLOBIN-HEMACUE: Hemoglobin: 11.2 g/dL — ABNORMAL LOW (ref 12.0–15.0)

## 2013-07-09 MED ORDER — EPOETIN ALFA 10000 UNIT/ML IJ SOLN: 40000.0000 [IU] | INTRAMUSCULAR | Status: DC

## 2013-07-13 ENCOUNTER — Other Ambulatory Visit: Payer: Self-pay

## 2013-07-13 MED ORDER — TORSEMIDE 20 MG PO TABS: ORAL_TABLET | ORAL | Status: DC

## 2013-07-14 ENCOUNTER — Other Ambulatory Visit: Payer: Self-pay | Admitting: Internal Medicine

## 2013-07-22 ENCOUNTER — Other Ambulatory Visit (HOSPITAL_COMMUNITY): Payer: Self-pay | Admitting: *Deleted

## 2013-07-23 ENCOUNTER — Encounter (HOSPITAL_COMMUNITY)
Admission: RE | Admit: 2013-07-23 | Discharge: 2013-07-23 | Disposition: A | Discharge status: Home / Self Care | Payer: Medicare Other | Source: Ambulatory Visit | Attending: Nephrology | Admitting: Nephrology

## 2013-07-23 DIAGNOSIS — D638 Anemia in other chronic diseases classified elsewhere: Secondary | ICD-10-CM | POA: Insufficient documentation

## 2013-07-23 DIAGNOSIS — N183 Chronic kidney disease, stage 3 unspecified: Secondary | ICD-10-CM | POA: Insufficient documentation

## 2013-07-23 MED ORDER — EPOETIN ALFA 10000 UNIT/ML IJ SOLN: 40000.0000 [IU] | INTRAMUSCULAR | Status: DC

## 2013-07-31 ENCOUNTER — Telehealth: Payer: Self-pay | Admitting: Internal Medicine

## 2013-07-31 NOTE — Telephone Encounter (Signed)
New problems  Pain under ribs//consistant for 1 week// please call back to discuss// pt wants to be seen today.

## 2013-07-31 NOTE — Telephone Encounter (Signed)
C/o pain under left ribs that go through to the back for 1 week, denies SOB but has increased pain with deep breath, denies nausea, no  pain radiating to neck or jaw, bp 130/60. Advised calling her pcp to be assessed, I made her an app 1st available with Dr Ross/ declined np/pa and told her I will send a msg to Dr Tenny Craw to see if she could be seen sooner, pt agreed to plan.

## 2013-08-03 NOTE — Telephone Encounter (Signed)
Contacted patient on Friday.  Pain appears to be muscular in origin. Recomm gentle stretching and heat.

## 2013-08-06 ENCOUNTER — Encounter (HOSPITAL_COMMUNITY)
Admission: RE | Admit: 2013-08-06 | Discharge: 2013-08-06 | Disposition: A | Discharge status: Home / Self Care | Payer: Medicare Other | Source: Ambulatory Visit | Attending: Nephrology | Admitting: Nephrology

## 2013-08-06 MED ORDER — EPOETIN ALFA 40000 UNIT/ML IJ SOLN
INTRAMUSCULAR | Status: AC
  Administered 2013-08-06: 40000 [IU] via SUBCUTANEOUS
  Filled 2013-08-06: qty 1

## 2013-08-06 MED ORDER — EPOETIN ALFA 10000 UNIT/ML IJ SOLN: 40000.0000 [IU] | INTRAMUSCULAR | Status: DC

## 2013-08-08 ENCOUNTER — Other Ambulatory Visit: Payer: Self-pay | Admitting: Internal Medicine

## 2013-08-12 ENCOUNTER — Encounter (HOSPITAL_COMMUNITY)
Admission: RE | Admit: 2013-08-12 | Discharge: 2013-08-12 | Disposition: A | Discharge status: Home / Self Care | Payer: Medicare Other | Source: Ambulatory Visit | Attending: Nephrology | Admitting: Nephrology

## 2013-08-12 DIAGNOSIS — D638 Anemia in other chronic diseases classified elsewhere: Secondary | ICD-10-CM | POA: Insufficient documentation

## 2013-08-12 DIAGNOSIS — N183 Chronic kidney disease, stage 3 unspecified: Secondary | ICD-10-CM | POA: Insufficient documentation

## 2013-08-12 LAB — IRON AND TIBC
Iron: 70 ug/dL (ref 42–135)
Saturation Ratios: 30 % (ref 20–55)
UIBC: 161 ug/dL (ref 125–400)

## 2013-08-12 MED ORDER — EPOETIN ALFA 10000 UNIT/ML IJ SOLN: 40000.0000 [IU] | INTRAMUSCULAR | Status: DC

## 2013-08-12 MED ORDER — EPOETIN ALFA 40000 UNIT/ML IJ SOLN
INTRAMUSCULAR | Status: AC
  Administered 2013-08-12: 09:00:00 40000 [IU]
  Filled 2013-08-12: qty 1

## 2013-08-12 NOTE — Telephone Encounter (Signed)
Requesting Tramdal 50mg  Last refill:06-29-13 Last OV:03-13-13 UDS:02-11-13-Low Risk Please advise.//AB/CMA

## 2013-08-19 ENCOUNTER — Encounter (HOSPITAL_COMMUNITY)
Admission: RE | Admit: 2013-08-19 | Discharge: 2013-08-19 | Disposition: A | Discharge status: Home / Self Care | Payer: Medicare Other | Source: Ambulatory Visit | Attending: Nephrology | Admitting: Nephrology

## 2013-08-19 MED ORDER — EPOETIN ALFA 10000 UNIT/ML IJ SOLN
40000.0000 [IU] | INTRAMUSCULAR | Status: DC
  Administered 2013-08-19: 40000 [IU] via SUBCUTANEOUS
  Filled 2013-08-19: qty 4

## 2013-08-19 MED ORDER — EPOETIN ALFA 40000 UNIT/ML IJ SOLN
INTRAMUSCULAR | Status: AC
  Filled 2013-08-19: qty 1

## 2013-08-20 MED FILL — Epoetin Alfa Inj 40000 Unit/ML: INTRAMUSCULAR | Qty: 1 | Status: AC

## 2013-08-26 ENCOUNTER — Other Ambulatory Visit (HOSPITAL_COMMUNITY): Payer: Self-pay | Admitting: *Deleted

## 2013-08-27 ENCOUNTER — Encounter (HOSPITAL_COMMUNITY)
Admission: RE | Admit: 2013-08-27 | Discharge: 2013-08-27 | Disposition: A | Discharge status: Home / Self Care | Payer: Medicare Other | Source: Ambulatory Visit | Attending: Nephrology | Admitting: Nephrology

## 2013-08-27 MED ORDER — EPOETIN ALFA 10000 UNIT/ML IJ SOLN: 40000.0000 [IU] | INTRAMUSCULAR | Status: DC

## 2013-08-27 MED ORDER — EPOETIN ALFA 40000 UNIT/ML IJ SOLN
INTRAMUSCULAR | Status: AC
  Administered 2013-08-27: 40000 [IU] via SUBCUTANEOUS
  Filled 2013-08-27: qty 1

## 2013-09-03 ENCOUNTER — Encounter (HOSPITAL_COMMUNITY)
Admission: RE | Admit: 2013-09-03 | Discharge: 2013-09-03 | Disposition: A | Discharge status: Home / Self Care | Payer: Medicare Other | Source: Ambulatory Visit | Attending: Nephrology | Admitting: Nephrology

## 2013-09-03 LAB — RENAL FUNCTION PANEL
CO2: 31 mEq/L (ref 19–32)
Calcium: 10.2 mg/dL (ref 8.4–10.5)
Chloride: 100 mEq/L (ref 96–112)
Creatinine, Ser: 2.76 mg/dL — ABNORMAL HIGH (ref 0.50–1.10)
GFR calc Af Amer: 17 mL/min — ABNORMAL LOW (ref >90)
Glucose, Bld: 75 mg/dL (ref 70–99)
Sodium: 141 mEq/L (ref 135–145)

## 2013-09-03 LAB — POCT HEMOGLOBIN-HEMACUE: Hemoglobin: 11 g/dL — ABNORMAL LOW (ref 12.0–15.0)

## 2013-09-03 LAB — VITAMIN B12: Vitamin B-12: 536 pg/mL (ref 211–911)

## 2013-09-03 MED ORDER — EPOETIN ALFA 10000 UNIT/ML IJ SOLN: 40000.0000 [IU] | INTRAMUSCULAR | Status: DC

## 2013-09-15 ENCOUNTER — Telehealth: Payer: Self-pay | Admitting: Gastroenterology

## 2013-09-15 MED ORDER — HYDROCORTISONE ACETATE 25 MG RE SUPP: 25.0000 mg | Freq: Every evening | RECTAL | Status: DC | PRN

## 2013-09-15 NOTE — Telephone Encounter (Signed)
Prescription sent to patient's pharmacy with one refill.

## 2013-09-17 ENCOUNTER — Encounter (HOSPITAL_COMMUNITY)
Admission: RE | Admit: 2013-09-17 | Discharge: 2013-09-17 | Disposition: A | Discharge status: Home / Self Care | Payer: Medicare Other | Source: Ambulatory Visit | Attending: Nephrology | Admitting: Nephrology

## 2013-09-17 DIAGNOSIS — N183 Chronic kidney disease, stage 3 unspecified: Secondary | ICD-10-CM | POA: Insufficient documentation

## 2013-09-17 DIAGNOSIS — D638 Anemia in other chronic diseases classified elsewhere: Secondary | ICD-10-CM | POA: Insufficient documentation

## 2013-09-17 LAB — IRON AND TIBC
Iron: 91 ug/dL (ref 42–135)
UIBC: 137 ug/dL (ref 125–400)

## 2013-09-17 MED ORDER — EPOETIN ALFA 20000 UNIT/ML IJ SOLN
INTRAMUSCULAR | Status: AC
  Filled 2013-09-17: qty 1

## 2013-09-17 MED ORDER — EPOETIN ALFA 10000 UNIT/ML IJ SOLN: 40000.0000 [IU] | INTRAMUSCULAR | Status: DC

## 2013-09-17 MED ORDER — EPOETIN ALFA 40000 UNIT/ML IJ SOLN
INTRAMUSCULAR | Status: AC
  Administered 2013-09-17: 10:00:00 40000 [IU] via SUBCUTANEOUS
  Filled 2013-09-17: qty 1

## 2013-09-17 MED ORDER — EPOETIN ALFA 10000 UNIT/ML IJ SOLN
INTRAMUSCULAR | Status: AC
  Filled 2013-09-17: qty 1

## 2013-09-18 ENCOUNTER — Encounter: Payer: Self-pay | Admitting: Internal Medicine

## 2013-09-24 ENCOUNTER — Encounter (HOSPITAL_COMMUNITY)
Admission: RE | Admit: 2013-09-24 | Discharge: 2013-09-24 | Disposition: A | Discharge status: Home / Self Care | Payer: Medicare Other | Source: Ambulatory Visit | Attending: Nephrology | Admitting: Nephrology

## 2013-09-24 MED ORDER — EPOETIN ALFA 10000 UNIT/ML IJ SOLN
40000.0000 [IU] | INTRAMUSCULAR | Status: DC
  Administered 2013-09-24: 40000 [IU] via SUBCUTANEOUS

## 2013-09-24 MED ORDER — EPOETIN ALFA 40000 UNIT/ML IJ SOLN
INTRAMUSCULAR | Status: AC
  Filled 2013-09-24: qty 1

## 2013-09-25 MED FILL — Epoetin Alfa Inj 40000 Unit/ML: INTRAMUSCULAR | Qty: 1 | Status: AC

## 2013-10-01 ENCOUNTER — Encounter (HOSPITAL_COMMUNITY)
Admission: RE | Admit: 2013-10-01 | Discharge: 2013-10-01 | Disposition: A | Discharge status: Home / Self Care | Payer: Medicare Other | Source: Ambulatory Visit | Attending: Nephrology | Admitting: Nephrology

## 2013-10-01 ENCOUNTER — Encounter: Payer: Self-pay | Admitting: Internal Medicine

## 2013-10-01 ENCOUNTER — Ambulatory Visit (INDEPENDENT_AMBULATORY_CARE_PROVIDER_SITE_OTHER): Payer: Medicare Other | Admitting: Internal Medicine

## 2013-10-01 ENCOUNTER — Encounter (HOSPITAL_COMMUNITY): Payer: Medicare Other

## 2013-10-01 VITALS — BP 140/60 | HR 55 | Ht <= 58 in | Wt 117.8 lb

## 2013-10-01 DIAGNOSIS — I4891 Unspecified atrial fibrillation: Secondary | ICD-10-CM

## 2013-10-01 LAB — POCT HEMOGLOBIN-HEMACUE: Hemoglobin: 11.3 g/dL — ABNORMAL LOW (ref 12.0–15.0)

## 2013-10-01 MED ORDER — EPOETIN ALFA 10000 UNIT/ML IJ SOLN: 40000.0000 [IU] | INTRAMUSCULAR | Status: DC

## 2013-10-01 NOTE — Progress Notes (Signed)
HPI Patient is an 79 year old with a history of restrictive cardiomyopathy, HTN, HL and renal insuff. I saw hier in the March 2014.   Since then she has done fairly well  Breathing is OK  Occasional fullness in neck  Occasional heart beating hard She says appetite OK  No CP  Better off of chol mediicine. Sleeping OK from brathing standpoint  Is fidgety  No Known Allergies  Current Outpatient Prescriptions  Medication Sig Dispense Refill  . acetaminophen (TYLENOL) 325 MG tablet Take 650 mg by mouth every 4 (four) hours as needed. For pain.      Marland Kitchen allopurinol (ZYLOPRIM) 100 MG tablet Take 100 mg by mouth every other day.      . calcitRIOL (ROCALTROL) 0.5 MCG capsule Take 0.5 mcg by mouth every other day.       Marland Kitchen epoetin alfa (PROCRIT) 04540 UNIT/ML injection Inject 40,000 Units into the skin once a week.       . hydrocortisone (ANUSOL-HC) 25 MG suppository Place 1 suppository (25 mg total) rectally at bedtime as needed. For hemorrhoids. Use for 10 days.  12 suppository  1  . levothyroxine (SYNTHROID, LEVOTHROID) 75 MCG tablet TAKE 1 TABLET BY MOUTH EVERY DAY  90 tablet  3  . losartan (COZAAR) 100 MG tablet Take 50 mg by mouth daily.      . metolazone (ZAROXOLYN) 2.5 MG tablet TAKE 1 TABLET BY MOUTH EVERY OTHER DAY AS NEEDED  30 tablet  5  . pantoprazole (PROTONIX) 40 MG tablet TAKE 1 TABLET BY MOUTH DAILY  60 tablet  0  . potassium chloride 40 MEQ/15ML (20%) LIQD TAKE BY MOUTH TWICE DAILY  900 mL  3  . torsemide (DEMADEX) 20 MG tablet TAKE 7 TABLETS BY MOUTH DAILY  210 tablet  0  . traMADol (ULTRAM) 50 MG tablet TAKE 1/2 TO 1 TABLET BY MOUTH EVERY 8- 12 HOURS AS NEEDED  60 tablet  0  . vitamin C (ASCORBIC ACID) 500 MG tablet Take 500 mg by mouth daily.         No current facility-administered medications for this visit.    Past Medical History  Diagnosis Date  . Restrictive cardiomyopathy   . Atrial fibrillation 1996    S/P cardioversion  . CVA (cerebral infarction) 1999  . CPAP  (continuous positive airway pressure) dependence   . Anemia     NOS  . Iron deficiency   . Gout   . Peptic ulcer, acute with hemorrhage 2005, 2011  . Hypertension   . Hypothyroidism   . Diastolic dysfunction   . Adenomatous colon polyp   . GERD (gastroesophageal reflux disease)   . Hiatal hernia   . Diverticulosis   . Anxiety disorder   . Arthritis   . Internal and external hemorrhoids without complication   . Morbid obesity   . CAD (coronary artery disease) 08/06/2008    One vessel by cath, EF overall preserved by echo 04/30/2008  . On home oxygen therapy   . Renal failure     Dr. Caryn Section  . Hyperlipidemia   . CHF (congestive heart failure)   . Complication of anesthesia     pt states she was sore "all-over" for over a week after anesthesia  . COAD (chronic obstructive airways disease) 2007    Exacerbation  . COPD (chronic obstructive pulmonary disease)     oxygen prn  . Sleep apnea     cpap - no longer uses  . Stroke  x2 -- effected vision; left sided slightly weak  . Rash     on upper extremities    Past Surgical History  Procedure Laterality Date  . Appendectomy    . Oophorectomy    . Incision and drainage of cellulitis  04/2008    Umbilical abcess  . Cardiac catheterization      Pulmonary HTN, non obstructive CAD  . Abdominal hysterectomy    . Mole removal      on leg    Family History  Problem Relation Age of Onset  . Breast cancer Mother   . Cancer Mother     breast  . Heart disease Father     MI  . Heart attack Father   . Breast cancer Sister   . Cancer Sister     breast  . Peripheral vascular disease Sister   . Heart disease Brother     CABG 5 vessel  . Rheum arthritis Brother   . Colon cancer Neg Hx     History   Social History  . Marital Status: Married    Spouse Name: N/A    Number of Children: N/A  . Years of Education: N/A   Occupational History  . Retired Social worker   Social History Main Topics  . Smoking status:  Never Smoker   . Smokeless tobacco: Never Used  . Alcohol Use: No  . Drug Use: No  . Sexual Activity: Not on file   Other Topics Concern  . Not on file   Social History Narrative   Married   One child   No regular exercise   Oxygen dependent    Daily caffeine use: one daily      06/27/2010 - Designated party form signed appointing spouse Hadasa Gasner or daughter Precious Haws; OK to leave message on home answering machine at [phone]; [name] [phone]    Review of Systems:  All systems reviewed.  They are negative to the above problem except as previously stated.  Vital Signs: BP 140/60  Pulse 55  Ht 4\' 10"  (1.473 m)  Wt 117 lb 12.8 oz (53.434 kg)  BMI 24.63 kg/m2  Physical Exam Patient is in NAD HEENT:  Normocephalic, atraumatic. EOMI, PERRLA.  Neck: JVP is increased  Lungs: clear to auscultation. No rales no wheezes.  Heart: Irregular rate and rhythm. Normal S1, S2. No S3.   No significant murmurs. PMI not displaced.  Abdomen:  Supple, nontender. Normal bowel sounds. No masses. No hepatomegaly.  Extremities:   Good distal pulses throughout. Tr+ lower extremity edema.  Musculoskeletal :moving all extremities.  Neuro:   alert and oriented x3.  CN II-XII grossly intact.  EKG  :atrial fib.  RBBB.  Motion artifact  Assessment and Plan:  1.  Restrictive CM  Volume status is not bad  I would keep on current regimen.  SHe is comfortable    2.  HTN  BP is adequate  3.  Atrial fibrillation.  Unfortunately because of GI bleeding she has not tolerated anticoagulants or asa.  Clinically she has done much better when she has not had intermitt bleeds.    4.  HL  Did not tolerate meds  I would set f/u for spring, sooner if symptoms change.

## 2013-10-01 NOTE — Patient Instructions (Signed)
Your physician wants you to follow-up in: 8 MONTHS WITH DR Tenny Craw You will receive a reminder letter in the mail two months in advance. If you don't receive a letter, please call our office to schedule the follow-up appointment.

## 2013-10-06 ENCOUNTER — Other Ambulatory Visit: Payer: Self-pay | Admitting: Family Medicine

## 2013-10-06 NOTE — Telephone Encounter (Signed)
traMADol (ULTRAM) 50 MG tablet Last OV: 03/13/2013 Last Refill: 08/08/2013 #60 Contract on file: Low risk

## 2013-10-06 NOTE — Telephone Encounter (Signed)
Hopp pt.

## 2013-10-06 NOTE — Telephone Encounter (Signed)
OK X 1, but make appointment before next refill . Please bring all actual pill bottles to that appt.

## 2013-10-15 ENCOUNTER — Encounter (HOSPITAL_COMMUNITY)
Admission: RE | Admit: 2013-10-15 | Discharge: 2013-10-15 | Disposition: A | Discharge status: Home / Self Care | Payer: Medicare Other | Source: Ambulatory Visit | Attending: Nephrology | Admitting: Nephrology

## 2013-10-15 DIAGNOSIS — N183 Chronic kidney disease, stage 3 unspecified: Secondary | ICD-10-CM | POA: Insufficient documentation

## 2013-10-15 DIAGNOSIS — D638 Anemia in other chronic diseases classified elsewhere: Secondary | ICD-10-CM | POA: Insufficient documentation

## 2013-10-15 LAB — RENAL FUNCTION PANEL
Albumin: 3.8 g/dL (ref 3.5–5.2)
CO2: 28 mEq/L (ref 19–32)
Calcium: 10.1 mg/dL (ref 8.4–10.5)
Chloride: 100 mEq/L (ref 96–112)
GFR calc Af Amer: 18 mL/min — ABNORMAL LOW (ref >90)
GFR calc non Af Amer: 15 mL/min — ABNORMAL LOW (ref >90)
Glucose, Bld: 97 mg/dL (ref 70–99)
Potassium: 4.3 mEq/L (ref 3.5–5.1)
Sodium: 142 mEq/L (ref 135–145)

## 2013-10-15 MED ORDER — EPOETIN ALFA 10000 UNIT/ML IJ SOLN: 40000.0000 [IU] | INTRAMUSCULAR | Status: DC

## 2013-10-15 MED ORDER — EPOETIN ALFA 40000 UNIT/ML IJ SOLN
INTRAMUSCULAR | Status: AC
  Administered 2013-10-15: 40000 [IU] via SUBCUTANEOUS
  Filled 2013-10-15: qty 1

## 2013-10-16 LAB — POCT HEMOGLOBIN-HEMACUE: Hemoglobin: 9.8 g/dL — ABNORMAL LOW (ref 12.0–15.0)

## 2013-10-22 ENCOUNTER — Encounter (HOSPITAL_COMMUNITY)
Admission: RE | Admit: 2013-10-22 | Discharge: 2013-10-22 | Disposition: A | Discharge status: Home / Self Care | Payer: Medicare Other | Source: Ambulatory Visit | Attending: Nephrology | Admitting: Nephrology

## 2013-10-22 LAB — IRON AND TIBC: UIBC: 180 ug/dL (ref 125–400)

## 2013-10-22 MED ORDER — EPOETIN ALFA 40000 UNIT/ML IJ SOLN
INTRAMUSCULAR | Status: AC
  Administered 2013-10-22: 11:00:00 40000 [IU] via SUBCUTANEOUS
  Filled 2013-10-22: qty 1

## 2013-10-22 MED ORDER — EPOETIN ALFA 10000 UNIT/ML IJ SOLN: 40000.0000 [IU] | INTRAMUSCULAR | Status: DC

## 2013-10-30 ENCOUNTER — Encounter (HOSPITAL_COMMUNITY): Payer: Medicare Other

## 2013-11-02 ENCOUNTER — Encounter (HOSPITAL_COMMUNITY)
Admission: RE | Admit: 2013-11-02 | Discharge: 2013-11-02 | Disposition: A | Discharge status: Home / Self Care | Payer: Medicare Other | Source: Ambulatory Visit | Attending: Nephrology | Admitting: Nephrology

## 2013-11-02 LAB — POCT HEMOGLOBIN-HEMACUE: Hemoglobin: 9.9 g/dL — ABNORMAL LOW (ref 12.0–15.0)

## 2013-11-02 MED ORDER — EPOETIN ALFA 10000 UNIT/ML IJ SOLN: 40000.0000 [IU] | INTRAMUSCULAR | Status: DC

## 2013-11-02 MED ORDER — EPOETIN ALFA 40000 UNIT/ML IJ SOLN
INTRAMUSCULAR | Status: AC
  Administered 2013-11-02: 40000 [IU] via SUBCUTANEOUS
  Filled 2013-11-02: qty 1

## 2013-11-09 ENCOUNTER — Encounter (HOSPITAL_COMMUNITY)
Admission: RE | Admit: 2013-11-09 | Discharge: 2013-11-09 | Disposition: A | Discharge status: Home / Self Care | Payer: Medicare Other | Source: Ambulatory Visit | Attending: Nephrology | Admitting: Nephrology

## 2013-11-09 DIAGNOSIS — N183 Chronic kidney disease, stage 3 unspecified: Secondary | ICD-10-CM | POA: Insufficient documentation

## 2013-11-09 DIAGNOSIS — D638 Anemia in other chronic diseases classified elsewhere: Secondary | ICD-10-CM | POA: Insufficient documentation

## 2013-11-09 MED ORDER — EPOETIN ALFA 20000 UNIT/ML IJ SOLN
INTRAMUSCULAR | Status: AC
  Administered 2013-11-09: 20000 [IU] via SUBCUTANEOUS
  Filled 2013-11-09: qty 1

## 2013-11-09 MED ORDER — EPOETIN ALFA 10000 UNIT/ML IJ SOLN: 40000.0000 [IU] | INTRAMUSCULAR | Status: DC

## 2013-11-16 IMAGING — CR DG CHEST 2V
2 series · 2 of 2 positions shown · non-contrast
Comparison: 10/19/2010

CLINICAL DATA: Chronic renal failure.  Preop respiratory exam for
dialysis fistula.

CHEST - 2 VIEW

[view not recorded (1 of 2)]
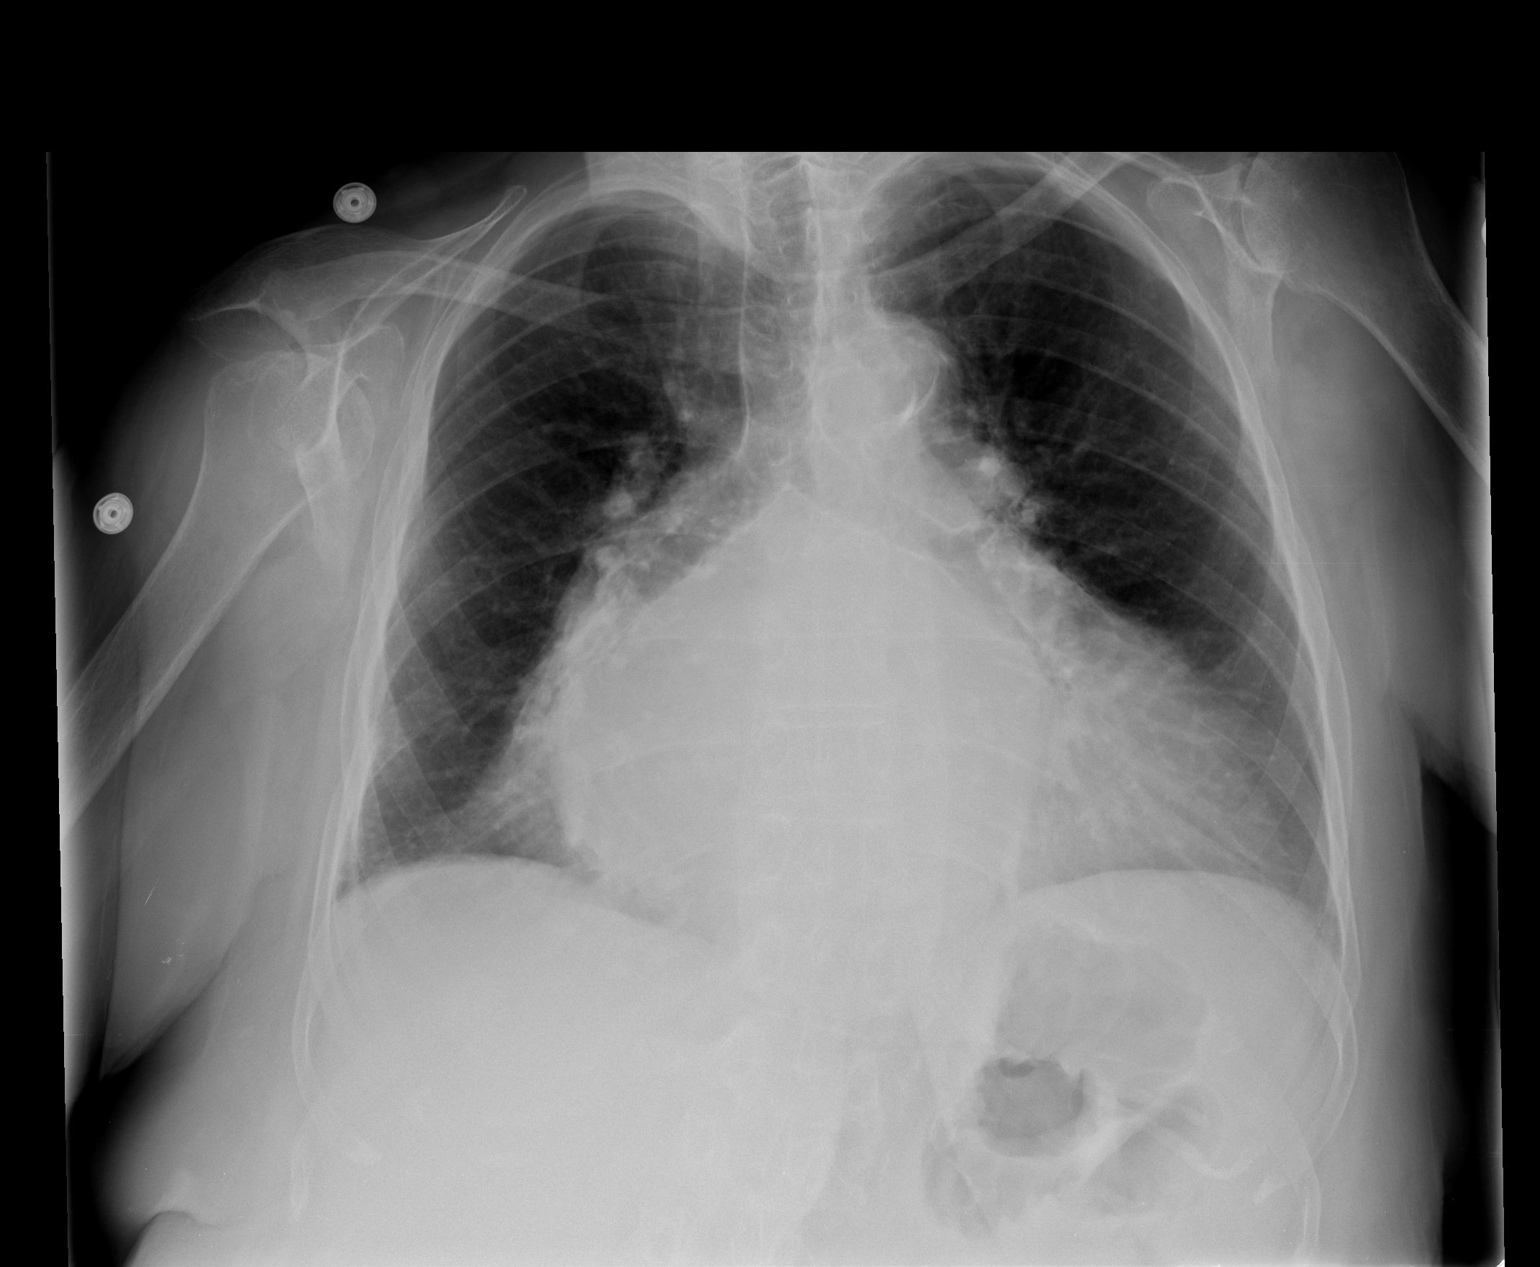

[view not recorded (2 of 2)]
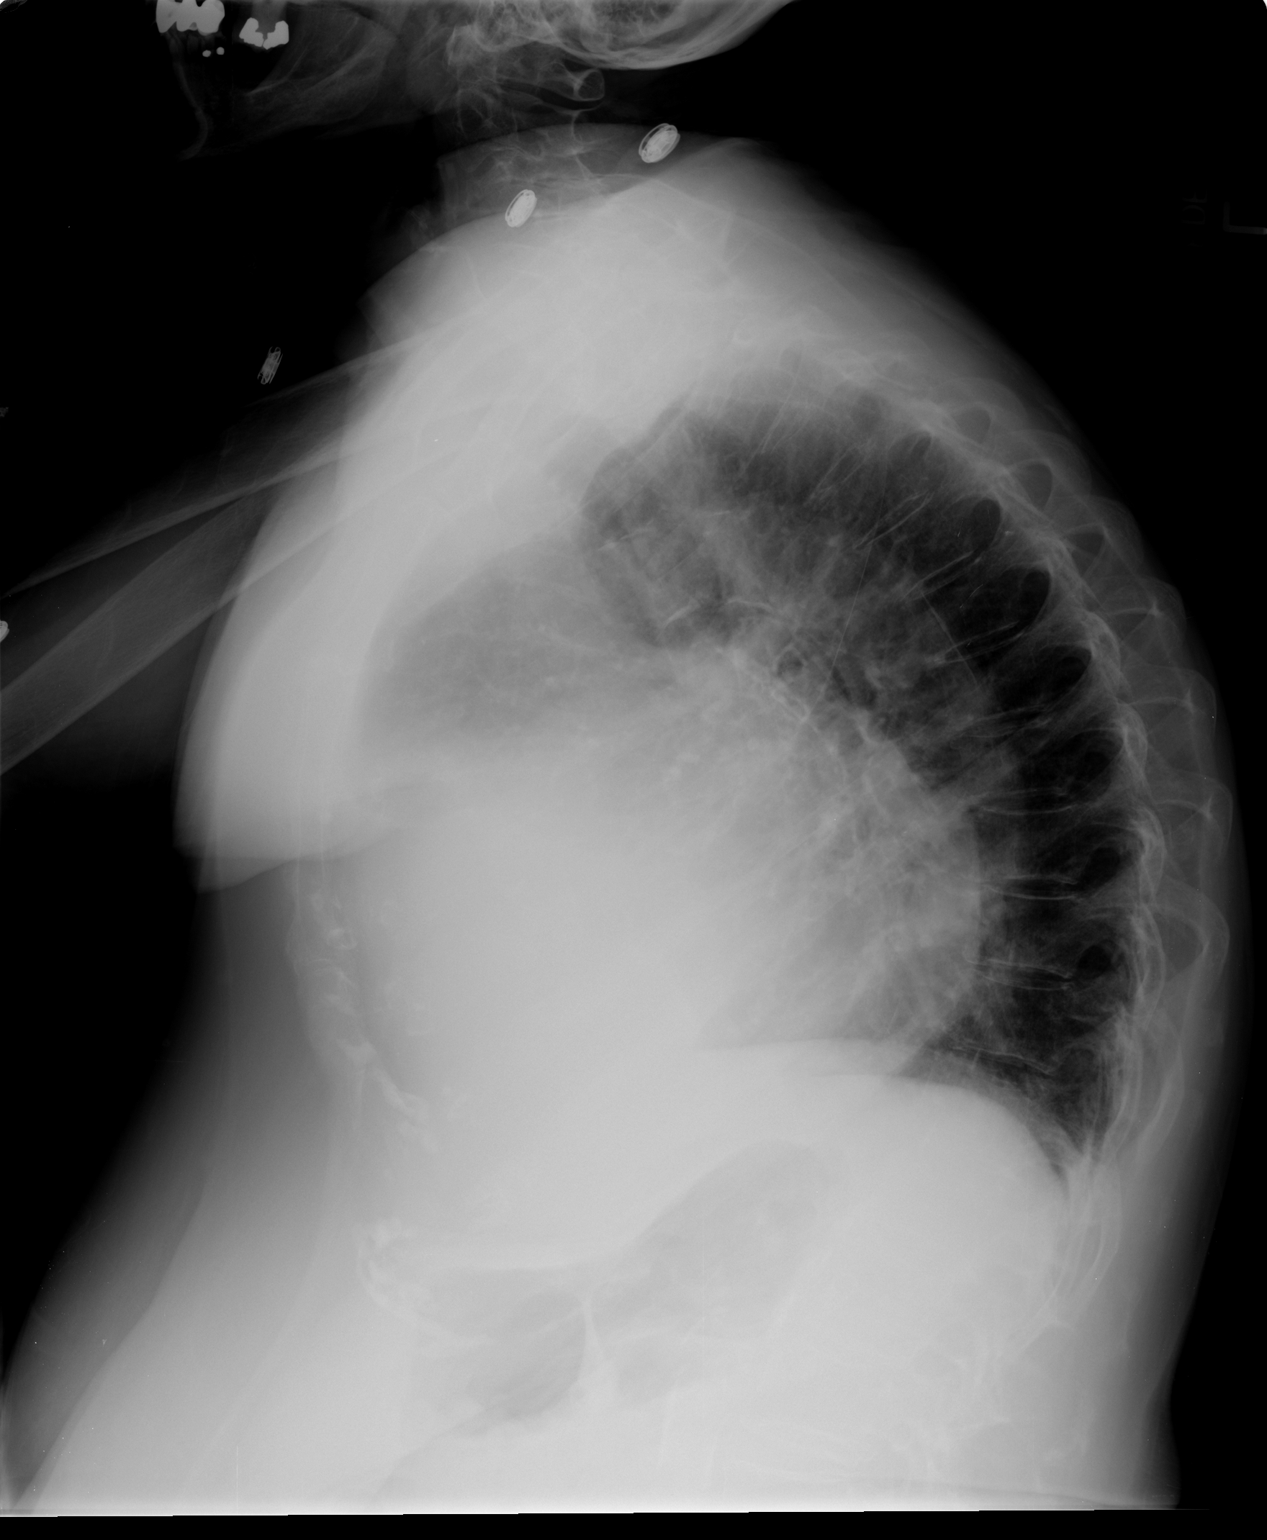

[2 of 2 positions shown; findings below may reference images not displayed]

FINDINGS: Severe cardiac enlargement remains stable.  No evidence
of congestive heart failure.  No evidence of pleural effusion.  No
evidence of pulmonary infiltrate.  No mass or lymphadenopathy
identified.
IMPRESSION: Stable severe cardiac enlargement.  No active lung disease.

## 2013-11-17 ENCOUNTER — Encounter (HOSPITAL_COMMUNITY)
Admission: RE | Admit: 2013-11-17 | Discharge: 2013-11-17 | Disposition: A | Discharge status: Home / Self Care | Payer: Medicare Other | Source: Ambulatory Visit | Attending: Nephrology | Admitting: Nephrology

## 2013-11-17 LAB — POCT HEMOGLOBIN-HEMACUE: Hemoglobin: 11.2 g/dL — ABNORMAL LOW (ref 12.0–15.0)

## 2013-11-17 MED ORDER — EPOETIN ALFA 10000 UNIT/ML IJ SOLN: 40000.0000 [IU] | INTRAMUSCULAR | Status: DC

## 2013-11-24 ENCOUNTER — Encounter (HOSPITAL_COMMUNITY): Payer: Medicare Other

## 2013-11-30 ENCOUNTER — Other Ambulatory Visit (HOSPITAL_COMMUNITY): Payer: Self-pay | Admitting: *Deleted

## 2013-12-01 ENCOUNTER — Encounter (HOSPITAL_COMMUNITY)
Admission: RE | Admit: 2013-12-01 | Discharge: 2013-12-01 | Disposition: A | Discharge status: Home / Self Care | Payer: Medicare Other | Source: Ambulatory Visit | Attending: Nephrology | Admitting: Nephrology

## 2013-12-01 LAB — RENAL FUNCTION PANEL
BUN: 106 mg/dL — ABNORMAL HIGH (ref 6–23)
CO2: 30 mEq/L (ref 19–32)
Calcium: 10 mg/dL (ref 8.4–10.5)
Chloride: 98 mEq/L (ref 96–112)
Creatinine, Ser: 2.36 mg/dL — ABNORMAL HIGH (ref 0.50–1.10)
Glucose, Bld: 82 mg/dL (ref 70–99)
Phosphorus: 4.5 mg/dL (ref 2.3–4.6)
Potassium: 4.2 mEq/L (ref 3.5–5.1)
Sodium: 141 mEq/L (ref 135–145)

## 2013-12-01 LAB — FERRITIN: Ferritin: 278 ng/mL (ref 10–291)

## 2013-12-01 LAB — POCT HEMOGLOBIN-HEMACUE: Hemoglobin: 10.4 g/dL — ABNORMAL LOW (ref 12.0–15.0)

## 2013-12-01 MED ORDER — EPOETIN ALFA 40000 UNIT/ML IJ SOLN
INTRAMUSCULAR | Status: AC
  Administered 2013-12-01: 13:00:00 40000 [IU] via SUBCUTANEOUS
  Filled 2013-12-01: qty 1

## 2013-12-01 MED ORDER — EPOETIN ALFA 10000 UNIT/ML IJ SOLN: 40000.0000 [IU] | INTRAMUSCULAR | Status: DC

## 2013-12-02 LAB — PTH, INTACT AND CALCIUM: Calcium, Total (PTH): 9.9 mg/dL (ref 8.4–10.5)

## 2013-12-08 ENCOUNTER — Encounter (HOSPITAL_COMMUNITY)
Admission: RE | Admit: 2013-12-08 | Discharge: 2013-12-08 | Disposition: A | Discharge status: Home / Self Care | Payer: Medicare Other | Source: Ambulatory Visit | Attending: Nephrology | Admitting: Nephrology

## 2013-12-08 LAB — POCT HEMOGLOBIN-HEMACUE: Hemoglobin: 10.1 g/dL — ABNORMAL LOW (ref 12.0–15.0)

## 2013-12-08 MED ORDER — EPOETIN ALFA 10000 UNIT/ML IJ SOLN: 40000.0000 [IU] | INTRAMUSCULAR | Status: DC

## 2013-12-08 MED ORDER — EPOETIN ALFA 40000 UNIT/ML IJ SOLN
INTRAMUSCULAR | Status: AC
  Administered 2013-12-08: 13:00:00 40000 [IU] via SUBCUTANEOUS
  Filled 2013-12-08: qty 1

## 2013-12-09 ENCOUNTER — Other Ambulatory Visit: Payer: Self-pay | Admitting: Internal Medicine

## 2013-12-09 ENCOUNTER — Ambulatory Visit (HOSPITAL_COMMUNITY): Payer: Medicare Other | Attending: Cardiology

## 2013-12-09 ENCOUNTER — Ambulatory Visit (INDEPENDENT_AMBULATORY_CARE_PROVIDER_SITE_OTHER): Payer: Medicare Other | Admitting: Internal Medicine

## 2013-12-09 ENCOUNTER — Encounter: Payer: Self-pay | Admitting: Internal Medicine

## 2013-12-09 ENCOUNTER — Encounter: Payer: Self-pay | Admitting: Cardiology

## 2013-12-09 VITALS — BP 135/57 | HR 62 | Temp 97.6°F | Wt 111.0 lb

## 2013-12-09 DIAGNOSIS — M109 Gout, unspecified: Secondary | ICD-10-CM

## 2013-12-09 DIAGNOSIS — E785 Hyperlipidemia, unspecified: Secondary | ICD-10-CM | POA: Insufficient documentation

## 2013-12-09 DIAGNOSIS — I509 Heart failure, unspecified: Secondary | ICD-10-CM

## 2013-12-09 DIAGNOSIS — R600 Localized edema: Secondary | ICD-10-CM

## 2013-12-09 DIAGNOSIS — R609 Edema, unspecified: Secondary | ICD-10-CM | POA: Insufficient documentation

## 2013-12-09 DIAGNOSIS — I739 Peripheral vascular disease, unspecified: Secondary | ICD-10-CM

## 2013-12-09 DIAGNOSIS — I70219 Atherosclerosis of native arteries of extremities with intermittent claudication, unspecified extremity: Secondary | ICD-10-CM

## 2013-12-09 DIAGNOSIS — M79609 Pain in unspecified limb: Secondary | ICD-10-CM | POA: Insufficient documentation

## 2013-12-09 DIAGNOSIS — J449 Chronic obstructive pulmonary disease, unspecified: Secondary | ICD-10-CM | POA: Insufficient documentation

## 2013-12-09 DIAGNOSIS — M79661 Pain in right lower leg: Secondary | ICD-10-CM

## 2013-12-09 DIAGNOSIS — I2510 Atherosclerotic heart disease of native coronary artery without angina pectoris: Secondary | ICD-10-CM | POA: Insufficient documentation

## 2013-12-09 DIAGNOSIS — I10 Essential (primary) hypertension: Secondary | ICD-10-CM | POA: Insufficient documentation

## 2013-12-09 DIAGNOSIS — J4489 Other specified chronic obstructive pulmonary disease: Secondary | ICD-10-CM | POA: Insufficient documentation

## 2013-12-09 DIAGNOSIS — N184 Chronic kidney disease, stage 4 (severe): Secondary | ICD-10-CM

## 2013-12-09 DIAGNOSIS — Z8673 Personal history of transient ischemic attack (TIA), and cerebral infarction without residual deficits: Secondary | ICD-10-CM | POA: Insufficient documentation

## 2013-12-09 DIAGNOSIS — Z8711 Personal history of peptic ulcer disease: Secondary | ICD-10-CM

## 2013-12-09 LAB — D-DIMER, QUANTITATIVE: D-Dimer, Quant: 2.13 ug/mL-FEU — ABNORMAL HIGH (ref 0.00–0.48)

## 2013-12-09 LAB — URIC ACID: Uric Acid, Serum: 8.1 mg/dL — ABNORMAL HIGH (ref 2.4–7.0)

## 2013-12-09 MED ORDER — FENTANYL 25 MCG/HR TD PT72: 25.0000 ug | MEDICATED_PATCH | TRANSDERMAL | Status: DC

## 2013-12-09 NOTE — Progress Notes (Signed)
Pre visit review using our clinic review tool, if applicable. No additional management support is needed unless otherwise documented below in the visit note. 

## 2013-12-09 NOTE — Progress Notes (Signed)
   Subjective:    Patient ID: [name], female    DOB: 1929/08/07, 79 y.o.   MRN: [id number]  HPI  She began to have pain in the right lower leg in the shin and calf area 10/23/13 in the context of having ridden to the beach over 3.5 hours and then returning. The pain began on the trip back.  Now she describes sharp pain which is constant with weightbearing. Tramadol is ineffective for this & for her chronic musculoskeletal pain.  The pain in the calf occurs within ambulating 10 yards. It's been associated with some swelling and redness.    She does have a history of gout which typically affected the dorsum of the foot . She is on allopurinol 100 mg daily. Her last uric acid was 11.1 on 02/11/13.  She is followed by Dr. Marisue Humble for chronic kidney disease- Stage 4 related to hypertension  & cardiovascular issues. She has secondary hyperparathyroidism related to the CKD.  She has a past history of peptic ulcer with acute hemorrhage Review of Systems   She denies associated chest pain, palpitations, hemoptysis or dyspnea  She was having some pain in the left back but this has resolved. No reported dysuria, pyuria, & hematuria.  She denies actual abdominal pain, melena, rectal bleeding.  She has had some hemorrhoidal bleeding in the past.     Objective:   Physical Exam  Gen.: Debilitated &chronically ill  in appearance. Alert, appropriate and cooperative throughout exam. Eyes: No corneal or conjunctival inflammation noted. No icterus Ears: Hearing is grossly decreased bilaterally. Neck: No deformities, masses, or tenderness noted.  Lungs: Normal respiratory effort; chest expands symmetrically. Lungs reveal fine bibasilar  rales w/o wheezes, or increased work of breathing. Heart: Slightly irregular  rate and rhythm. Normal S1 and S2. No gallop, click, or rub.Flow murmur. Abdomen: Bowel sounds normal; abdomen soft and nontender. No masses, organomegaly or hernias noted.                       Musculoskeletal/extremities: No clubbing or  cyanosis noted. 1/2+ edema left lower extremity and one half-1+ edema right lower extremity. Positive Homan's suggested on the right with possible palpable cord medially .Tone & strength decreased Hand joints  reveal minor flexion changes.  In wheelchair Vascular: Carotid, radial artery, dorsalis pedis and  posterior tibial pulses are equal. Decreased pedal pulses bilaterally. L carotid bruit present. Neurologic: Alert and oriented x3.        Skin: Stasis hyperpigmentation over both lower shin/ankles. Prominent vascularity without varicosities. Lymph: No cervical, axillary lymphadenopathy present. Psych: Mood and affect are normal. Normally interactive                                                                                        Assessment & Plan:  #1  right lower extremity shin/calf pain; rule out DVT  #2 past history of gout; clinically gout does not appear to be present at this time  #3 history of peptic ulcer disease which would preclude prophylactic anticoagulation.  #4 chronic pain syndrome  Plan: See orders. Fentanyl patch trial for chronic pain

## 2013-12-09 NOTE — Patient Instructions (Signed)
Use warm moist compresses to 3 times a day to the R calf if studies negative for clots. Your next office appointment will be determined based upon review of your pending labs & Doppler. Those instructions will be transmitted to you through My Chart  or by mail if you're not using this system.  Followup as needed for your this acute issue. Please report any significant change in your symptoms.

## 2013-12-11 ENCOUNTER — Encounter: Payer: Self-pay | Admitting: *Deleted

## 2013-12-11 ENCOUNTER — Ambulatory Visit (HOSPITAL_COMMUNITY): Payer: Medicare Other | Attending: Internal Medicine

## 2013-12-11 ENCOUNTER — Telehealth: Payer: Self-pay | Admitting: *Deleted

## 2013-12-11 DIAGNOSIS — I2510 Atherosclerotic heart disease of native coronary artery without angina pectoris: Secondary | ICD-10-CM | POA: Insufficient documentation

## 2013-12-11 DIAGNOSIS — E785 Hyperlipidemia, unspecified: Secondary | ICD-10-CM | POA: Insufficient documentation

## 2013-12-11 DIAGNOSIS — J449 Chronic obstructive pulmonary disease, unspecified: Secondary | ICD-10-CM | POA: Insufficient documentation

## 2013-12-11 DIAGNOSIS — L98499 Non-pressure chronic ulcer of skin of other sites with unspecified severity: Secondary | ICD-10-CM

## 2013-12-11 DIAGNOSIS — Z8673 Personal history of transient ischemic attack (TIA), and cerebral infarction without residual deficits: Secondary | ICD-10-CM | POA: Insufficient documentation

## 2013-12-11 DIAGNOSIS — I739 Peripheral vascular disease, unspecified: Secondary | ICD-10-CM

## 2013-12-11 DIAGNOSIS — I10 Essential (primary) hypertension: Secondary | ICD-10-CM | POA: Insufficient documentation

## 2013-12-11 DIAGNOSIS — I70219 Atherosclerosis of native arteries of extremities with intermittent claudication, unspecified extremity: Secondary | ICD-10-CM

## 2013-12-11 DIAGNOSIS — J4489 Other specified chronic obstructive pulmonary disease: Secondary | ICD-10-CM | POA: Insufficient documentation

## 2013-12-11 DIAGNOSIS — I70229 Atherosclerosis of native arteries of extremities with rest pain, unspecified extremity: Secondary | ICD-10-CM

## 2013-12-11 DIAGNOSIS — I70209 Unspecified atherosclerosis of native arteries of extremities, unspecified extremity: Secondary | ICD-10-CM | POA: Insufficient documentation

## 2013-12-11 DIAGNOSIS — R0989 Other specified symptoms and signs involving the circulatory and respiratory systems: Secondary | ICD-10-CM

## 2013-12-11 NOTE — Telephone Encounter (Signed)
Can not be used if using Fentanyl patches

## 2013-12-11 NOTE — Telephone Encounter (Signed)
traMADol (ULTRAM) 50 MG tablet Last refill: 10/06/13 #60, 0 refills Last OV: 12/09/2013 Contract on file

## 2013-12-11 NOTE — Telephone Encounter (Signed)
Called and left message to please call back and let us know if she is using Fentalyn patches. JG//CMA

## 2013-12-17 ENCOUNTER — Encounter (HOSPITAL_COMMUNITY): Payer: Medicare Other

## 2013-12-18 ENCOUNTER — Telehealth: Payer: Self-pay | Admitting: *Deleted

## 2013-12-18 NOTE — Telephone Encounter (Signed)
Patient daughter called to check on her mother's referral to a vascular specialist

## 2013-12-18 NOTE — Telephone Encounter (Signed)
Pt has upcoming appt in February. LMOVM

## 2013-12-21 ENCOUNTER — Encounter (HOSPITAL_COMMUNITY)
Admission: RE | Admit: 2013-12-21 | Discharge: 2013-12-21 | Disposition: A | Discharge status: Home / Self Care | Payer: Medicare Other | Source: Ambulatory Visit | Attending: Nephrology | Admitting: Nephrology

## 2013-12-21 DIAGNOSIS — N183 Chronic kidney disease, stage 3 unspecified: Secondary | ICD-10-CM | POA: Insufficient documentation

## 2013-12-21 DIAGNOSIS — D638 Anemia in other chronic diseases classified elsewhere: Secondary | ICD-10-CM | POA: Insufficient documentation

## 2013-12-21 LAB — POCT HEMOGLOBIN-HEMACUE: Hemoglobin: 10.3 g/dL — ABNORMAL LOW (ref 12.0–15.0)

## 2013-12-21 MED ORDER — EPOETIN ALFA 40000 UNIT/ML IJ SOLN
INTRAMUSCULAR | Status: AC
  Administered 2013-12-21: 40000 [IU] via SUBCUTANEOUS
  Filled 2013-12-21: qty 1

## 2013-12-21 MED ORDER — EPOETIN ALFA 10000 UNIT/ML IJ SOLN: 40000.0000 [IU] | INTRAMUSCULAR | Status: DC

## 2013-12-21 NOTE — Telephone Encounter (Signed)
[name], can this appt be moved up at all? I see she has one the beginning of February with Vascular but the pain is according to her daughter "inbearable".

## 2013-12-21 NOTE — Telephone Encounter (Signed)
Lm for scheduler @ [facility]

## 2013-12-22 ENCOUNTER — Telehealth: Payer: Self-pay | Admitting: *Deleted

## 2013-12-22 NOTE — Telephone Encounter (Signed)
Is she going to take Tramadol; she said it did not help Therefore I Rxed Fentanyl patch ; she should use both

## 2013-12-22 NOTE — Telephone Encounter (Signed)
traMADol (ULTRAM) 50 MG tablet Last refill: 10/06/13 #60, 0 refills Last OV: 12/09/2013 Contract on file, low risk

## 2013-12-23 NOTE — Telephone Encounter (Signed)
Spoke with patient's daughter and she stated that patient is using her last patch today and prefers the Tramadol instead. Is refill ok to send? JG//CMA

## 2013-12-23 NOTE — Telephone Encounter (Signed)
OK X1;R X 1 

## 2013-12-24 ENCOUNTER — Encounter: Payer: Self-pay | Admitting: Vascular Surgery

## 2013-12-24 ENCOUNTER — Other Ambulatory Visit: Payer: Self-pay | Admitting: *Deleted

## 2013-12-24 MED ORDER — TRAMADOL HCL 50 MG PO TABS: ORAL_TABLET | ORAL | Status: DC

## 2013-12-24 NOTE — Telephone Encounter (Signed)
Script printed and faxed to [facility]. JG//CMA

## 2013-12-25 ENCOUNTER — Ambulatory Visit (INDEPENDENT_AMBULATORY_CARE_PROVIDER_SITE_OTHER): Payer: Medicare Other | Admitting: Vascular Surgery

## 2013-12-25 ENCOUNTER — Encounter: Payer: Self-pay | Admitting: *Deleted

## 2013-12-25 ENCOUNTER — Other Ambulatory Visit: Payer: Self-pay | Admitting: *Deleted

## 2013-12-25 ENCOUNTER — Encounter: Payer: Self-pay | Admitting: Vascular Surgery

## 2013-12-25 VITALS — BP 113/62 | HR 74 | Resp 16 | Ht <= 58 in | Wt 111.0 lb

## 2013-12-25 DIAGNOSIS — I7025 Atherosclerosis of native arteries of other extremities with ulceration: Secondary | ICD-10-CM | POA: Insufficient documentation

## 2013-12-25 DIAGNOSIS — I739 Peripheral vascular disease, unspecified: Secondary | ICD-10-CM

## 2013-12-25 DIAGNOSIS — L97919 Non-pressure chronic ulcer of unspecified part of right lower leg with unspecified severity: Secondary | ICD-10-CM

## 2013-12-25 DIAGNOSIS — I83019 Varicose veins of right lower extremity with ulcer of unspecified site: Secondary | ICD-10-CM | POA: Insufficient documentation

## 2013-12-25 DIAGNOSIS — M79604 Pain in right leg: Secondary | ICD-10-CM | POA: Insufficient documentation

## 2013-12-25 DIAGNOSIS — L97909 Non-pressure chronic ulcer of unspecified part of unspecified lower leg with unspecified severity: Secondary | ICD-10-CM

## 2013-12-25 DIAGNOSIS — M79605 Pain in left leg: Secondary | ICD-10-CM

## 2013-12-25 DIAGNOSIS — M7989 Other specified soft tissue disorders: Secondary | ICD-10-CM | POA: Insufficient documentation

## 2013-12-25 DIAGNOSIS — L98499 Non-pressure chronic ulcer of skin of other sites with unspecified severity: Principal | ICD-10-CM

## 2013-12-25 DIAGNOSIS — I83009 Varicose veins of unspecified lower extremity with ulcer of unspecified site: Secondary | ICD-10-CM

## 2013-12-25 MED ORDER — HYDROCODONE-ACETAMINOPHEN 5-325 MG PO TABS: 1.0000 | ORAL_TABLET | Freq: Four times a day (QID) | ORAL | Status: DC | PRN

## 2013-12-25 MED ORDER — CEPHALEXIN 500 MG PO CAPS: 500.0000 mg | ORAL_CAPSULE | Freq: Every day | ORAL | Status: DC

## 2013-12-25 MED ORDER — SULFAMETHOXAZOLE-TRIMETHOPRIM 400-80 MG PO TABS
1.0000 | ORAL_TABLET | Freq: Two times a day (BID) | ORAL | Status: DC
Start: 2013-12-25 — End: 2013-12-25

## 2013-12-25 NOTE — Progress Notes (Signed)
Referred by:  Pecola Lawless, MD [phone] [facility] [address] [place], [place] [address]  Reason for referral: Right leg pain  History of Present Illness  [name] is a 80 y.o. (1928/12/18) female who presents with chief complaint: Bilateral leg pain (R>>L).  Onset of symptom occurred in Nov 2014.  Pain is described as sharp, severity 10/10, and associated with manipulating the right leg.  Patient has attempted to treat this pain with Tramadol and rest.  The patient has intermittent rest pain symptoms also and right medial leg ulcers which started the last week with serous drainage from them.  The patient denies any fever or chills.  Atherosclerotic risk factors include: HTN, HLD.  Past Medical History  Diagnosis Date  . Restrictive cardiomyopathy   . Atrial fibrillation 1996    S/P cardioversion  . CVA (cerebral infarction) 1999  . CPAP (continuous positive airway pressure) dependence   . Anemia     NOS  . Iron deficiency   . Gout   . Peptic ulcer, acute with hemorrhage 2005, 2011  . Hypertension   . Hypothyroidism   . Diastolic dysfunction   . Adenomatous colon polyp   . GERD (gastroesophageal reflux disease)   . Hiatal hernia   . Diverticulosis   . Anxiety disorder   . Arthritis   . Internal and external hemorrhoids without complication   . Morbid obesity   . CAD (coronary artery disease) 08/06/2008    One vessel by cath, EF overall preserved by echo 04/30/2008  . On home oxygen therapy   . Renal failure     Dr. Caryn Section  . Hyperlipidemia   . CHF (congestive heart failure)   . Complication of anesthesia     pt states she was sore "all-over" for over a week after anesthesia  . COAD (chronic obstructive airways disease) 2007    Exacerbation  . COPD (chronic obstructive pulmonary disease)     oxygen prn  . Sleep apnea     cpap - no longer uses  . Stroke     x2 -- effected vision; left sided slightly weak  . Rash     on upper extremities    Past  Surgical History  Procedure Laterality Date  . Appendectomy    . Oophorectomy    . Incision and drainage of cellulitis  04/2008    Umbilical abcess  . Cardiac catheterization      Pulmonary HTN, non obstructive CAD  . Abdominal hysterectomy    . Mole removal      on leg    History   Social History  . Marital Status: Married    Spouse Name: N/A    Number of Children: N/A  . Years of Education: N/A   Occupational History  . Retired Social worker   Social History Main Topics  . Smoking status: Never Smoker   . Smokeless tobacco: Never Used  . Alcohol Use: No  . Drug Use: No  . Sexual Activity: Not on file   Other Topics Concern  . Not on file   Social History Narrative   Married   One child   No regular exercise   Oxygen dependent    Daily caffeine use: one daily      06/27/2010 - Designated party form signed appointing spouse [name] or daughter [name]; OK to leave message on home answering machine at [phone]; [name] [phone]    Family History  Problem  Relation Age of Onset  . Breast cancer Mother   . Cancer Mother     breast  . Heart disease Father     MI  . Heart attack Father   . Breast cancer Sister   . Cancer Sister     breast  . Peripheral vascular disease Sister   . Heart disease Brother     CABG 5 vessel  . Rheum arthritis Brother   . Colon cancer Neg Hx     Current Outpatient Prescriptions on File Prior to Visit  Medication Sig Dispense Refill  . acetaminophen (TYLENOL) 325 MG tablet Take 650 mg by mouth every 4 (four) hours as needed. For pain.      Marland Kitchen allopurinol (ZYLOPRIM) 100 MG tablet Take 100 mg by mouth every other day.      . calcitRIOL (ROCALTROL) 0.5 MCG capsule Take 0.5 mcg by mouth every other day.       Marland Kitchen epoetin alfa (PROCRIT) 10258 UNIT/ML injection Inject 40,000 Units into the skin once a week.       . fentaNYL (DURAGESIC) 25 MCG/HR patch Place 1 patch (25 mcg total) onto the skin every 3 (three) days.  5  patch  0  . hydrocortisone (ANUSOL-HC) 25 MG suppository Place 1 suppository (25 mg total) rectally at bedtime as needed. For hemorrhoids. Use for 10 days.  12 suppository  1  . levothyroxine (SYNTHROID, LEVOTHROID) 75 MCG tablet TAKE 1 TABLET BY MOUTH EVERY DAY  90 tablet  3  . losartan (COZAAR) 100 MG tablet Take 50 mg by mouth daily.      . metolazone (ZAROXOLYN) 2.5 MG tablet TAKE 1 TABLET BY MOUTH EVERY OTHER DAY AS NEEDED  30 tablet  5  . pantoprazole (PROTONIX) 40 MG tablet TAKE 1 TABLET BY MOUTH DAILY  60 tablet  0  . potassium chloride 40 MEQ/15ML (20%) LIQD TAKE 15ML BY MOUTH TWICE DAILY  900 mL  3  . torsemide (DEMADEX) 20 MG tablet TAKE 7 TABLETS BY MOUTH DAILY  210 tablet  0  . traMADol (ULTRAM) 50 MG tablet TAKE 1/2 TO 1 TABLET BY MOUTH EVERY 8 TO 12 HOURS AS NEEDED  60 tablet  1  . vitamin C (ASCORBIC ACID) 500 MG tablet Take 500 mg by mouth daily.         No current facility-administered medications on file prior to visit.    No Known Allergies  REVIEW OF SYSTEMS:  (Positives checked otherwise negative)  CARDIOVASCULAR:  []  chest pain, []  chest pressure, []  palpitations, [x]  shortness of breath when laying flat, []  shortness of breath with exertion,  [x]  pain in feet when walking, [x]  pain in feet when laying flat, []  history of blood clot in veins (DVT), []  history of phlebitis, [x]  swelling in legs, []  varicose veins  PULMONARY:  []  productive cough, []  asthma, []  wheezing  NEUROLOGIC:  [x]  weakness in arms or legs, []  numbness in arms or legs, []  difficulty speaking or slurred speech, []  temporary loss of vision in one eye, [x]  dizziness  HEMATOLOGIC:  [x]  bleeding problems, []  problems with blood clotting too easily  MUSCULOSKEL:  []  joint pain, []  joint swelling  GASTROINTEST:  []  vomiting blood, []  blood in stool, [x]  h/o PUD  GENITOURINARY:  []  burning with urination, []  blood in urine  PSYCHIATRIC:  []  history of major depression  INTEGUMENTARY:  []  rashes,  [x]  ulcers  CONSTITUTIONAL:  []  fever, []  chills  For VQI Use Only  PRE-ADM LIVING:  Home  AMB STATUS: Ambulatory with Assistance  CAD Sx: None  PRIOR CHF: Moderate  STRESS TEST: [x]  No, [ ]  Normal, [ ]  + ischemia, [ ]  + MI, [ ]  Both  Physical Examination  Filed Vitals:   12/25/13 1542  BP: 113/62  Pulse: 74  Resp: 16  Height: 4\' 10"  (1.473 m)  Weight: 111 lb (50.349 kg)   Body mass index is 23.21 kg/(m^2).  General: A&O x 3, WD ,emaciated, elderly  Head: [place]/AT, Temporalis wasting,   Ear/Nose/Throat: Hearing grossly intact, nares w/o erythema or drainage, oropharynx w/o Erythema/Exudate, Mallampati score: 3  Eyes: PERRLA, EOMI  Neck: Supple, no nuchal rigidity, no palpable LAD  Pulmonary: Sym exp, good air movt, CTAB, no rales, rhonchi, & wheezing  Cardiac: RRR, Nl S1, S2, no Murmurs, rubs or gallops  Vascular: Vessel Right Left  Radial Palpable Palpable  Brachial Palpable Palpable  Carotid Palpable, without bruit Palpable, without bruit  Aorta Not palpable N/A  Femoral Palpable Palpable  Popliteal Not palpable Not palpable  PT Not Palpable Palpable  DP Not Palpable Palpable   Gastrointestinal: soft, NTND, -G/R, - HSM, - masses, - CVAT B  Musculoskeletal: M/S BUE 3-4/5, BLE not tested due to pain, B LDS, B extensive spider vein, medial ankle VSU with serous drainage, edema 1+, erythema through R ant. shin  Neurologic: CN 2-12 intact , Pain and light touch intact in extremities , Motor exam as listed above  Psychiatric: Judgment intact, Mood & affect appropriate for pt's clinical situation  Dermatologic: See M/S exam for extremity exam, no rashes otherwise noted  Lymph : No Cervical, Axillary, or Inguinal lymphadenopathy   Non-Invasive Vascular Imaging  Outside ABI (Date: 12/11/13)  R: 0.14, DP: mono, PT: mono, TBI: 0  L: 1.12, DP: tri, PT: tri, TBI: 0.40  Outside Studies/Documentation 6 pages of outside documents were reviewed including:  outpatient clinic chart, outside BLE ABI.  Medical Decision Making  [name] is a 80 y.o. female who presents with: RLE critical limb ischemia, likely chronic venous insufficiency (C6 = venous stasis ulcers), chronic kidney disease stage IV    Given this patient's poor functional status and multiple active co-morbidities, she is not a surgical revascularization candidate.  Her only hope for limb salvage is a percutaneous approach.  I discussed with the patient the natural history of critical limb ischemia: 25% require amputation in one year, 50% are able to maintain their limbs in one year, and 25-30% die in one year due to comorbidities.  Given the limb threatening status of this patient, I recommend an aggressive work up including proceeding with an: Aortogram, Bilateral runoff and intervention. I discussed with the patient the nature of angiographic procedures, especially the limited patencies of any endovascular intervention. The patient is aware of that the risks of an angiographic procedure include but are not limited to: bleeding, infection, access site complications, embolization, rupture of treated vessel, dissection, possible need for emergent surgical intervention, and possible need for surgical procedures to treat the patient's pathology. The patient is aware of the risks and agrees to proceed.  The procedure is scheduled for: aortogram with CO2, Right leg runoff, and possible intervention.  I discussed in depth with the patient the nature of atherosclerosis, and emphasized the importance of maximal medical management including strict control of blood pressure, blood glucose, and lipid levels, antiplatelet agents, obtaining regular exercise, and cessation of smoking.  The patient is aware that without maximal medical management the underlying atherosclerotic disease process will  progress, limiting the benefit of any interventions. The patient is currently not on a statin:   Unknown reason. The patient is currently not on an anti-platelet: due to history of PUD.  Thank you for allowing [place] to participate in this patient's care.  Adele Barthel, MD [facility] Office: [phone] Pager: [phone]  12/25/2013, 5:10 PM

## 2013-12-28 ENCOUNTER — Encounter (HOSPITAL_COMMUNITY): Payer: Self-pay | Admitting: Pharmacy Technician

## 2013-12-28 ENCOUNTER — Encounter (HOSPITAL_COMMUNITY): Payer: Medicare Other

## 2013-12-30 MED ORDER — SODIUM CHLORIDE 0.9 % IV SOLN
INTRAVENOUS | Status: DC
  Administered 2013-12-31: 09:00:00 via INTRAVENOUS

## 2013-12-31 ENCOUNTER — Encounter (HOSPITAL_COMMUNITY)
Admission: RE | Disposition: A | Discharge status: Home / Self Care | Payer: Self-pay | Source: Ambulatory Visit | Attending: Vascular Surgery

## 2013-12-31 ENCOUNTER — Other Ambulatory Visit: Payer: Self-pay | Admitting: *Deleted

## 2013-12-31 ENCOUNTER — Telehealth: Payer: Self-pay | Admitting: Vascular Surgery

## 2013-12-31 ENCOUNTER — Ambulatory Visit (HOSPITAL_COMMUNITY)
Admission: RE | Admit: 2013-12-31 | Discharge: 2013-12-31 | Disposition: A | Discharge status: Home / Self Care | Payer: Medicare Other | Source: Ambulatory Visit | Attending: Nephrology | Admitting: Nephrology

## 2013-12-31 ENCOUNTER — Ambulatory Visit (HOSPITAL_COMMUNITY)
Admission: RE | Admit: 2013-12-31 | Discharge: 2013-12-31 | Disposition: A | Discharge status: Home / Self Care | Payer: Medicare Other | Source: Ambulatory Visit | Attending: Vascular Surgery | Admitting: Vascular Surgery

## 2013-12-31 DIAGNOSIS — L98499 Non-pressure chronic ulcer of skin of other sites with unspecified severity: Principal | ICD-10-CM

## 2013-12-31 DIAGNOSIS — G4730 Sleep apnea, unspecified: Secondary | ICD-10-CM | POA: Insufficient documentation

## 2013-12-31 DIAGNOSIS — F411 Generalized anxiety disorder: Secondary | ICD-10-CM | POA: Insufficient documentation

## 2013-12-31 DIAGNOSIS — I739 Peripheral vascular disease, unspecified: Secondary | ICD-10-CM

## 2013-12-31 DIAGNOSIS — K219 Gastro-esophageal reflux disease without esophagitis: Secondary | ICD-10-CM | POA: Insufficient documentation

## 2013-12-31 DIAGNOSIS — I129 Hypertensive chronic kidney disease with stage 1 through stage 4 chronic kidney disease, or unspecified chronic kidney disease: Secondary | ICD-10-CM | POA: Insufficient documentation

## 2013-12-31 DIAGNOSIS — J4489 Other specified chronic obstructive pulmonary disease: Secondary | ICD-10-CM | POA: Insufficient documentation

## 2013-12-31 DIAGNOSIS — E785 Hyperlipidemia, unspecified: Secondary | ICD-10-CM | POA: Insufficient documentation

## 2013-12-31 DIAGNOSIS — E039 Hypothyroidism, unspecified: Secondary | ICD-10-CM | POA: Insufficient documentation

## 2013-12-31 DIAGNOSIS — N184 Chronic kidney disease, stage 4 (severe): Secondary | ICD-10-CM | POA: Insufficient documentation

## 2013-12-31 DIAGNOSIS — Z9981 Dependence on supplemental oxygen: Secondary | ICD-10-CM | POA: Insufficient documentation

## 2013-12-31 DIAGNOSIS — K449 Diaphragmatic hernia without obstruction or gangrene: Secondary | ICD-10-CM | POA: Insufficient documentation

## 2013-12-31 DIAGNOSIS — L97909 Non-pressure chronic ulcer of unspecified part of unspecified lower leg with unspecified severity: Secondary | ICD-10-CM | POA: Insufficient documentation

## 2013-12-31 DIAGNOSIS — J449 Chronic obstructive pulmonary disease, unspecified: Secondary | ICD-10-CM | POA: Insufficient documentation

## 2013-12-31 DIAGNOSIS — M109 Gout, unspecified: Secondary | ICD-10-CM | POA: Insufficient documentation

## 2013-12-31 DIAGNOSIS — Z8673 Personal history of transient ischemic attack (TIA), and cerebral infarction without residual deficits: Secondary | ICD-10-CM | POA: Insufficient documentation

## 2013-12-31 DIAGNOSIS — I428 Other cardiomyopathies: Secondary | ICD-10-CM | POA: Insufficient documentation

## 2013-12-31 DIAGNOSIS — I2510 Atherosclerotic heart disease of native coronary artery without angina pectoris: Secondary | ICD-10-CM | POA: Insufficient documentation

## 2013-12-31 DIAGNOSIS — I509 Heart failure, unspecified: Secondary | ICD-10-CM | POA: Insufficient documentation

## 2013-12-31 HISTORY — PX: ABDOMINAL AORTAGRAM: SHX5454

## 2013-12-31 LAB — POCT I-STAT, CHEM 8
BUN: 128 mg/dL — AB (ref 6–23)
CALCIUM ION: 1.14 mmol/L (ref 1.13–1.30)
Chloride: 93 mEq/L — ABNORMAL LOW (ref 96–112)
Creatinine, Ser: 3.7 mg/dL — ABNORMAL HIGH (ref 0.50–1.10)
Glucose, Bld: 78 mg/dL (ref 70–99)
HCT: 32 % — ABNORMAL LOW (ref 36.0–46.0)
HEMOGLOBIN: 10.9 g/dL — AB (ref 12.0–15.0)
Potassium: 2.7 mEq/L — CL (ref 3.7–5.3)
SODIUM: 138 meq/L (ref 137–147)
TCO2: 31 mmol/L (ref 0–100)

## 2013-12-31 LAB — POTASSIUM: POTASSIUM: 3 meq/L — AB (ref 3.7–5.3)

## 2013-12-31 SURGERY — ABDOMINAL AORTAGRAM
Anesthesia: LOCAL | Laterality: Right

## 2013-12-31 MED ORDER — SODIUM CHLORIDE 0.9 % IJ SOLN: 3.0000 mL | Freq: Two times a day (BID) | INTRAMUSCULAR | Status: DC

## 2013-12-31 MED ORDER — HEPARIN (PORCINE) IN NACL 2-0.9 UNIT/ML-% IJ SOLN
INTRAMUSCULAR | Status: AC
  Filled 2013-12-31: qty 1000

## 2013-12-31 MED ORDER — EPOETIN ALFA 40000 UNIT/ML IJ SOLN
INTRAMUSCULAR | Status: AC
  Administered 2013-12-31: 40000 [IU] via SUBCUTANEOUS
  Filled 2013-12-31: qty 1

## 2013-12-31 MED ORDER — POTASSIUM CHLORIDE CRYS ER 20 MEQ PO TBCR: 40.0000 meq | EXTENDED_RELEASE_TABLET | Freq: Once | ORAL | Status: DC

## 2013-12-31 MED ORDER — ACETAMINOPHEN 325 MG PO TABS: 650.0000 mg | ORAL_TABLET | ORAL | Status: DC | PRN

## 2013-12-31 MED ORDER — FENTANYL CITRATE 0.05 MG/ML IJ SOLN
INTRAMUSCULAR | Status: AC
  Filled 2013-12-31: qty 2

## 2013-12-31 MED ORDER — SODIUM CHLORIDE 0.9 % IJ SOLN: 3.0000 mL | INTRAMUSCULAR | Status: DC | PRN

## 2013-12-31 MED ORDER — POTASSIUM CHLORIDE 20 MEQ/15ML (10%) PO LIQD
40.0000 meq | Freq: Once | ORAL | Status: AC
  Administered 2013-12-31: 40 meq via ORAL
  Filled 2013-12-31: qty 30

## 2013-12-31 MED ORDER — MIDAZOLAM HCL 2 MG/2ML IJ SOLN
INTRAMUSCULAR | Status: AC
  Filled 2013-12-31: qty 2

## 2013-12-31 MED ORDER — LIDOCAINE HCL (PF) 1 % IJ SOLN
INTRAMUSCULAR | Status: AC
  Filled 2013-12-31: qty 30

## 2013-12-31 MED ORDER — OXYCODONE HCL 5 MG PO TABS: 5.0000 mg | ORAL_TABLET | ORAL | Status: DC | PRN

## 2013-12-31 MED ORDER — OXYCODONE-ACETAMINOPHEN 5-325 MG PO TABS
1.0000 | ORAL_TABLET | ORAL | Status: DC | PRN
  Administered 2013-12-31: 2 via ORAL
  Filled 2013-12-31: qty 2

## 2013-12-31 MED ORDER — SODIUM CHLORIDE 0.9 % IV SOLN: 250.0000 mL | INTRAVENOUS | Status: DC | PRN

## 2013-12-31 MED ORDER — ONDANSETRON HCL 4 MG/2ML IJ SOLN: 4.0000 mg | Freq: Four times a day (QID) | INTRAMUSCULAR | Status: DC | PRN

## 2013-12-31 MED ORDER — EPOETIN ALFA 10000 UNIT/ML IJ SOLN: 40000.0000 [IU] | INTRAMUSCULAR | Status: DC

## 2013-12-31 NOTE — H&P (View-Only) (Signed)
  Referred by:  William F Hopper, MD [address] [place], [place] [address]  Reason for referral: Right leg pain  History of Present Illness  [name] is a 80 y.o. (12/17/1928) female who presents with chief complaint: Bilateral leg pain (R>>L).  Onset of symptom occurred in Nov 2014.  Pain is described as sharp, severity 10/10, and associated with manipulating the right leg.  Patient has attempted to treat this pain with Tramadol and rest.  The patient has intermittent rest pain symptoms also and right medial leg ulcers which started the last week with serous drainage from them.  The patient denies any fever or chills.  Atherosclerotic risk factors include: HTN, HLD.  Past Medical History  Diagnosis Date  . Restrictive cardiomyopathy   . Atrial fibrillation 1996    S/P cardioversion  . CVA (cerebral infarction) 1999  . CPAP (continuous positive airway pressure) dependence   . Anemia     NOS  . Iron deficiency   . Gout   . Peptic ulcer, acute with hemorrhage 2005, 2011  . Hypertension   . Hypothyroidism   . Diastolic dysfunction   . Adenomatous colon polyp   . GERD (gastroesophageal reflux disease)   . Hiatal hernia   . Diverticulosis   . Anxiety disorder   . Arthritis   . Internal and external hemorrhoids without complication   . Morbid obesity   . CAD (coronary artery disease) 08/06/2008    One vessel by cath, EF overall preserved by echo 04/30/2008  . On home oxygen therapy   . Renal failure     Dr. Fox  . Hyperlipidemia   . CHF (congestive heart failure)   . Complication of anesthesia     pt states she was sore "all-over" for over a week after anesthesia  . COAD (chronic obstructive airways disease) 2007    Exacerbation  . COPD (chronic obstructive pulmonary disease)     oxygen prn  . Sleep apnea     cpap - no longer uses  . Stroke     x2 -- effected vision; left sided slightly weak  . Rash     on upper extremities    Past  Surgical History  Procedure Laterality Date  . Appendectomy    . Oophorectomy    . Incision and drainage of cellulitis  04/2008    Umbilical abcess  . Cardiac catheterization      Pulmonary HTN, non obstructive CAD  . Abdominal hysterectomy    . Mole removal      on leg    History   Social History  . Marital Status: Married    Spouse Name: N/A    Number of Children: N/A  . Years of Education: N/A   Occupational History  . Retired [employer]   Social History Main Topics  . Smoking status: Never Smoker   . Smokeless tobacco: Never Used  . Alcohol Use: No  . Drug Use: No  . Sexual Activity: Not on file   Other Topics Concern  . Not on file   Social History Narrative   Married   One child   No regular exercise   Oxygen dependent    Daily caffeine use: one daily      06/27/2010 - Designated party form signed appointing spouse Wadeford Fetterman or daughter Debbie Stokes; OK to leave message on home answering machine at [phone]; Debbie - [phone]    Family History  Problem   Relation Age of Onset  . Breast cancer Mother   . Cancer Mother     breast  . Heart disease Father     MI  . Heart attack Father   . Breast cancer Sister   . Cancer Sister     breast  . Peripheral vascular disease Sister   . Heart disease Brother     CABG 5 vessel  . Rheum arthritis Brother   . Colon cancer Neg Hx     Current Outpatient Prescriptions on File Prior to Visit  Medication Sig Dispense Refill  . acetaminophen (TYLENOL) 325 MG tablet Take 650 mg by mouth every 4 (four) hours as needed. For pain.      Marland Kitchen allopurinol (ZYLOPRIM) 100 MG tablet Take 100 mg by mouth every other day.      . calcitRIOL (ROCALTROL) 0.5 MCG capsule Take 0.5 mcg by mouth every other day.       Marland Kitchen epoetin alfa (PROCRIT) 10258 UNIT/ML injection Inject 40,000 Units into the skin once a week.       . fentaNYL (DURAGESIC) 25 MCG/HR patch Place 1 patch (25 mcg total) onto the skin every 3 (three) days.  5  patch  0  . hydrocortisone (ANUSOL-HC) 25 MG suppository Place 1 suppository (25 mg total) rectally at bedtime as needed. For hemorrhoids. Use for 10 days.  12 suppository  1  . levothyroxine (SYNTHROID, LEVOTHROID) 75 MCG tablet TAKE 1 TABLET BY MOUTH EVERY DAY  90 tablet  3  . losartan (COZAAR) 100 MG tablet Take 50 mg by mouth daily.      . metolazone (ZAROXOLYN) 2.5 MG tablet TAKE 1 TABLET BY MOUTH EVERY OTHER DAY AS NEEDED  30 tablet  5  . pantoprazole (PROTONIX) 40 MG tablet TAKE 1 TABLET BY MOUTH DAILY  60 tablet  0  . potassium chloride 40 MEQ/15ML (20%) LIQD TAKE 15ML BY MOUTH TWICE DAILY  900 mL  3  . torsemide (DEMADEX) 20 MG tablet TAKE 7 TABLETS BY MOUTH DAILY  210 tablet  0  . traMADol (ULTRAM) 50 MG tablet TAKE 1/2 TO 1 TABLET BY MOUTH EVERY 8 TO 12 HOURS AS NEEDED  60 tablet  1  . vitamin C (ASCORBIC ACID) 500 MG tablet Take 500 mg by mouth daily.         No current facility-administered medications on file prior to visit.    No Known Allergies  REVIEW OF SYSTEMS:  (Positives checked otherwise negative)  CARDIOVASCULAR:  []  chest pain, []  chest pressure, []  palpitations, [x]  shortness of breath when laying flat, []  shortness of breath with exertion,  [x]  pain in feet when walking, [x]  pain in feet when laying flat, []  history of blood clot in veins (DVT), []  history of phlebitis, [x]  swelling in legs, []  varicose veins  PULMONARY:  []  productive cough, []  asthma, []  wheezing  NEUROLOGIC:  [x]  weakness in arms or legs, []  numbness in arms or legs, []  difficulty speaking or slurred speech, []  temporary loss of vision in one eye, [x]  dizziness  HEMATOLOGIC:  [x]  bleeding problems, []  problems with blood clotting too easily  MUSCULOSKEL:  []  joint pain, []  joint swelling  GASTROINTEST:  []  vomiting blood, []  blood in stool, [x]  h/o PUD  GENITOURINARY:  []  burning with urination, []  blood in urine  PSYCHIATRIC:  []  history of major depression  INTEGUMENTARY:  []  rashes,  [x]  ulcers  CONSTITUTIONAL:  []  fever, []  chills  For VQI Use Only  PRE-ADM LIVING:  Home  AMB STATUS: Ambulatory with Assistance  CAD Sx: None  PRIOR CHF: Moderate  STRESS TEST: [x]  No, [ ]  Normal, [ ]  + ischemia, [ ]  + MI, [ ]  Both  Physical Examination  Filed Vitals:   12/25/13 1542  BP: 113/62  Pulse: 74  Resp: 16  Height: 4\' 10"  (1.473 m)  Weight: 111 lb (50.349 kg)   Body mass index is 23.21 kg/(m^2).  General: A&O x 3, WD ,emaciated, elderly  Head: [place]/AT, Temporalis wasting,   Ear/Nose/Throat: Hearing grossly intact, nares w/o erythema or drainage, oropharynx w/o Erythema/Exudate, Mallampati score: 3  Eyes: PERRLA, EOMI  Neck: Supple, no nuchal rigidity, no palpable LAD  Pulmonary: Sym exp, good air movt, CTAB, no rales, rhonchi, & wheezing  Cardiac: RRR, Nl S1, S2, no Murmurs, rubs or gallops  Vascular: Vessel Right Left  Radial Palpable Palpable  Brachial Palpable Palpable  Carotid Palpable, without bruit Palpable, without bruit  Aorta Not palpable N/A  Femoral Palpable Palpable  Popliteal Not palpable Not palpable  PT Not Palpable Palpable  DP Not Palpable Palpable   Gastrointestinal: soft, NTND, -G/R, - HSM, - masses, - CVAT B  Musculoskeletal: M/S BUE 3-4/5, BLE not tested due to pain, B LDS, B extensive spider vein, medial ankle VSU with serous drainage, edema 1+, erythema through R ant. shin  Neurologic: CN 2-12 intact , Pain and light touch intact in extremities , Motor exam as listed above  Psychiatric: Judgment intact, Mood & affect appropriate for pt's clinical situation  Dermatologic: See M/S exam for extremity exam, no rashes otherwise noted  Lymph : No Cervical, Axillary, or Inguinal lymphadenopathy   Non-Invasive Vascular Imaging  Outside ABI (Date: 12/11/13)  R: 0.14, DP: mono, PT: mono, TBI: 0  L: 1.12, DP: tri, PT: tri, TBI: 0.40  Outside Studies/Documentation 6 pages of outside documents were reviewed including:  outpatient clinic chart, outside BLE ABI.  Medical Decision Making  ALYXANDRA TENBRINK is a 80 y.o. female who presents with: RLE critical limb ischemia, likely chronic venous insufficiency (C6 = venous stasis ulcers), chronic kidney disease stage IV    Given this patient's poor functional status and multiple active co-morbidities, she is not a surgical revascularization candidate.  Her only hope for limb salvage is a percutaneous approach.  I discussed with the patient the natural history of critical limb ischemia: 25% require amputation in one year, 50% are able to maintain their limbs in one year, and 25-30% die in one year due to comorbidities.  Given the limb threatening status of this patient, I recommend an aggressive work up including proceeding with an: Aortogram, Bilateral runoff and intervention. I discussed with the patient the nature of angiographic procedures, especially the limited patencies of any endovascular intervention. The patient is aware of that the risks of an angiographic procedure include but are not limited to: bleeding, infection, access site complications, embolization, rupture of treated vessel, dissection, possible need for emergent surgical intervention, and possible need for surgical procedures to treat the patient's pathology. The patient is aware of the risks and agrees to proceed.  The procedure is scheduled for: aortogram with CO2, Right leg runoff, and possible intervention.  I discussed in depth with the patient the nature of atherosclerosis, and emphasized the importance of maximal medical management including strict control of blood pressure, blood glucose, and lipid levels, antiplatelet agents, obtaining regular exercise, and cessation of smoking.  The patient is aware that without maximal medical management the underlying atherosclerotic disease process will  progress, limiting the benefit of any interventions. The patient is currently not on a statin:   Unknown reason. The patient is currently not on an anti-platelet: due to history of PUD.  Thank you for allowing [place] to participate in this patient's care.  Adele Barthel, MD [facility] Office: [phone] Pager: [phone]  12/25/2013, 5:10 PM

## 2013-12-31 NOTE — Telephone Encounter (Addendum)
Message copied by Doristine Section on Thu Dec 31, 2013  3:22 PM ------      Message from: [place], [place] K      Created: Thu Dec 31, 2013  1:35 PM      Regarding: schedule                   ----- Message -----         From: Sherrye Payor, RN         Sent: 12/31/2013   1:21 PM           To: Mena Goes, CMA                        ----- Message -----         From: Conrad Harmony, MD         Sent: 12/31/2013  10:59 AM           To: Patrici Ranks, Lynetta Mare Pullins, RN            [name]      [id number]      May 02, 1929            PROCEDURE:      1.  Left common femoral artery cannulation under ultrasound guidance      2.  Placement of catheter in aorta      3.  Aortogram with carbon dioxide      4.  Second order arterial selection      5.  Right leg runoff with carbon dioxide and contrast            Follow-up: 4 weeks            Orders(s) for follow-up:             1.  Refer patient to wound care clinic at [facility]       ------  notified patient of appt. at wound care center on 01-13-14 8:15 and then a 4 wk fu with blc on 01-29-14

## 2013-12-31 NOTE — Interval H&P Note (Signed)
[facility]  History and Physical Update  The patient was interviewed and re-examined.  The patient's previous History and Physical has been reviewed and is unchanged.  There is no change in the plan of care: Aortogram with CO2, right leg runoff with CO2 and contrast.  Adele Barthel, MD [facility] Office: [phone] Pager: [phone]  12/31/2013, 8:13 AM

## 2013-12-31 NOTE — Discharge Instructions (Signed)
Angiography, Care After ° °Refer to this sheet in the next few weeks. These instructions provide you with information on caring for yourself after your procedure. Your health care provider may also give you more specific instructions. Your treatment has been planned according to current medical practices, but problems sometimes occur. Call your health care provider if you have any problems or questions after your procedure.  °WHAT TO EXPECT AFTER THE PROCEDURE °After your procedure, it is typical to have the following sensations: °· Minor discomfort or tenderness and a small bump at the catheter insertion site. The bump should usually decrease in size and tenderness within 1 to 2 weeks. °· Any bruising will usually fade within 2 to 4 weeks. °HOME CARE INSTRUCTIONS  °· You may need to keep taking blood thinners if they were prescribed for you. Only take over-the-counter or prescription medicines for pain, fever, or discomfort as directed by your health care provider. °· Do not apply powder or lotion to the site. °· Do not sit in a bathtub, swimming pool, or whirlpool for 5 to 7 days. °· You may shower 24 hours after the procedure. Remove the bandage (dressing) and gently wash the site with plain soap and water. Gently pat the site dry. °· Inspect the site at least twice daily. °· Limit your activity for the first 24 hours. Do not bend, squat, or lift anything over 10 lb (9 kg) or as directed by your health care provider. °· Do not drive home if you are discharged the day of the procedure. Have someone else drive you. Follow instructions about when you can drive or return to work. °SEEK MEDICAL CARE IF: °· You get lightheaded when standing up. °· You have drainage (other than a small amount of blood on the dressing). °· You have chills. °· You have a fever. °· You have redness, warmth, swelling, or pain at the insertion site. °SEEK IMMEDIATE MEDICAL CARE IF:  °· You develop chest pain or shortness of breath, feel  faint, or pass out. °· You have bleeding, swelling larger than a walnut, or drainage from the catheter insertion site. °· You develop pain, discoloration, coldness, or severe bruising in the leg or arm that held the catheter. °· You have heavy bleeding from the site. If this happens, hold pressure on the site. °MAKE SURE YOU: °· Understand these instructions. °· Will watch your condition. °· Will get help right away if you are not doing well or get worse. °Document Released: 06/14/2005 Document Revised: 07/29/2013 Document Reviewed: 04/20/2013 °ExitCare® Patient Information ©2014 [employer]. ° °

## 2013-12-31 NOTE — Op Note (Signed)
OPERATIVE NOTE   PROCEDURE: 1.  Left common femoral artery cannulation under ultrasound guidance 2.  Placement of catheter in aorta 3.  Aortogram with carbon dioxide 4.  Second order arterial selection 5.  Right leg runoff with carbon dioxide and contrast  PRE-OPERATIVE DIAGNOSIS: right leg critical limb ischemia   POST-OPERATIVE DIAGNOSIS: same as above   SURGEON: Adele Barthel, MD  ANESTHESIA: conscious sedation  ESTIMATED BLOOD LOSS: 50 cc  CONTRAST: 70 cc contrast, 120 cc carbon dioxide  FINDING(S):  Aorta: heavily calcificed throughout  Superior mesenteric artery: suboptimal visualization, suggestion of proximal stenosis Celiac artery: patent, heavily calcified splenic artery   Right Left  RA Not seen Not seen  CIA Patent but heavily calcified Patent but heavily calcified  EIA Patent but heavily calcified Patent but heavily calcified  IIA Patent but heavily calcified Patent but heavily calcified  CFA Patent but heavily calcified with ~50 % stenosis mid-segment Patent but heavily calcified  SFA Patent with multiple >90% stenoses, heavily calcified throughout   PFA Patent   Pop Occludes at knee   Trif Occluded   AT Faintly reconstituted, miniscule, not suitable for reconstruction   Pero Faintly reconstituted, miniscule, not suitable for reconstruction   PT Occluded    SPECIMEN(S):  none  INDICATIONS:   [name] is a 80 y.o. female who presents with right leg ulcers and ABI consist with critical limb ischemia.  The patient presents for: aortogram, right leg runoff, and possible intervention.  I discussed with the patient the nature of angiographic procedures, especially the limited patencies of any endovascular intervention.  The patient is aware of that the risks of an angiographic procedure include but are not limited to: bleeding, infection, access site complications, renal failure, embolization, rupture of vessel, dissection, possible need for emergent  surgical intervention, possible need for surgical procedures to treat the patient's pathology, and stroke and death.  The patient is aware of the risks and agrees to proceed.  DESCRIPTION: After full informed consent was obtained from the patient, the patient was brought back to the angiography suite.  The patient was placed supine upon the angiography table and connected to monitoring equipment.  The patient was then given conscious sedation, the amounts of which are documented in the patient's chart.  The patient was prepped and drape in the standard fashion for an angiographic procedure.  At this point, attention was turned to the left groin.  Under ultrasound guidance, the left common femoral artery was cannulated with a micropuncture needle with difficulty due to severe calcification.  The microwire was advanced into the iliac arterial system.  The needle was exchanged for a microsheath, which was loaded into the common femoral artery over the wire.  The microwire was exchanged for a [facility] wire which was advanced into the aorta.  The microsheath was then exchanged for a 5-Fr sheath which was loaded into the common femoral artery.  The Omniflush catheter was then loaded over the wire up to the level of L1.  The catheter was connected to the carbon dioxide circuit.  After de-airring and de-clotting the circuit, a carbon dioxide aortogram was completed.  The findings are above.  The [facility] wire was replaced in the catheter, and using the [place] and Omniflush catheter, the right common iliac artery was selected.  The wire was advanced into the external iliac artery but the catheter would not cross the aortic bifurcation.  The catheter was exchanged for an endhole catheter.  I was able to  advance the catheter into the proximal external iliac artery but it would not track any further.  I removed the wire and connected the catheter to the carbon dioxide circuit.  A hand injection of the proximal thigh was obtained  with carbon dioxide.  The patient could not tolerate the injection well, so the catheter was reconnected to the power injector circuit.  The left leg runoff was completed in stations with contrast dye.  The findings are listed above.  Based on the images, no endovascular or open vascular reconstruction is likely to be successful.  The catheter was removed.  The sheath was aspirated.  No clots were present and the sheath was reloaded with heparinized saline.    The patient will need to consider medical management of her disease vs. palliative right above-knee amputation.  COMPLICATIONS: none  CONDITION: stable   Adele Barthel, MD [facility] [place] Office: [phone] Pager: [phone]  12/31/2013, 10:47 AM

## 2014-01-01 LAB — POCT I-STAT 3, ART BLOOD GAS (G3+)
ACID-BASE EXCESS: 3 mmol/L — AB (ref 0.0–2.0)
Acid-Base Excess: 4 mmol/L — ABNORMAL HIGH (ref 0.0–2.0)
BICARBONATE: 28 meq/L — AB (ref 20.0–24.0)
BICARBONATE: 29.1 meq/L — AB (ref 20.0–24.0)
O2 Saturation: 99 %
O2 Saturation: 99 %
PCO2 ART: 45.1 mmHg — AB (ref 35.0–45.0)
PH ART: 7.418 (ref 7.350–7.450)
PO2 ART: 136 mmHg — AB (ref 80.0–100.0)
TCO2: 29 mmol/L (ref 0–100)
TCO2: 30 mmol/L (ref 0–100)
pCO2 arterial: 44 mmHg (ref 35.0–45.0)
pH, Arterial: 7.411 (ref 7.350–7.450)
pO2, Arterial: 130 mmHg — ABNORMAL HIGH (ref 80.0–100.0)

## 2014-01-04 ENCOUNTER — Telehealth: Payer: Self-pay

## 2014-01-04 DIAGNOSIS — I739 Peripheral vascular disease, unspecified: Principal | ICD-10-CM

## 2014-01-04 DIAGNOSIS — I998 Other disorder of circulatory system: Secondary | ICD-10-CM

## 2014-01-04 MED ORDER — HYDROCODONE-ACETAMINOPHEN 5-325 MG PO TABS: 1.0000 | ORAL_TABLET | Freq: Four times a day (QID) | ORAL | Status: DC | PRN

## 2014-01-04 NOTE — Telephone Encounter (Signed)
Phone call from pt's. daughter.  Reports that the pain medication is not working.  States her mother is in "tremendous pain" in the right leg, from the knee down to foot.  States it is worse at rest and with walking.  Also reports that the pain is worse now than it had been.  Unable to bear full weight on the right leg at all.  Reports the right leg is very red, but that it had been this way, prior to the procedure.  States that pt. has used almost all the pain medication.  Reports that the plain Oxycodone 5 mg. don't seem to hold her as long as the Hydrocodone/ Acetaminophen.  Reports she has open sores on inner right lower leg/ shin area, and is scheduled to go to the [facility] for continued treatment of open wound next week.  Denies fever/chills.  Phone call to Dr. Bridgett Larsson.  Advised that there are no revascularization options, and to offer to schedule procedure to do Right AKA on 1/28; if pt. Not able to agree to amputation, then she would need referral to pain management.  Ret'd call to pt's daughter.  Given options as outlined per Dr. Bridgett Larsson.  States she will discuss with her mother and father.  Asking about how to manage pt's pain in the interim.  Discussed with Dr. Trula Slade.  Approval for Hydrocodone/ Acetaminophen 5/325 1 tab. q 6 hrs/ prn/ # 20/ no refills.  Advised daughter that Rx will be ready for pick up at the office.  Verb. Understanding.

## 2014-01-05 ENCOUNTER — Inpatient Hospital Stay (HOSPITAL_COMMUNITY): Admit: 2014-01-05 | Payer: Medicare Other

## 2014-01-06 ENCOUNTER — Other Ambulatory Visit: Payer: Self-pay

## 2014-01-07 ENCOUNTER — Other Ambulatory Visit: Payer: Self-pay

## 2014-01-07 DIAGNOSIS — M79606 Pain in leg, unspecified: Secondary | ICD-10-CM

## 2014-01-07 DIAGNOSIS — I739 Peripheral vascular disease, unspecified: Secondary | ICD-10-CM

## 2014-01-07 DIAGNOSIS — I70229 Atherosclerosis of native arteries of extremities with rest pain, unspecified extremity: Secondary | ICD-10-CM

## 2014-01-07 MED ORDER — HYDROCODONE-ACETAMINOPHEN 5-325 MG PO TABS: 1.0000 | ORAL_TABLET | Freq: Four times a day (QID) | ORAL | Status: DC | PRN

## 2014-01-07 NOTE — Progress Notes (Signed)
Daughter called requesting refill on Hydrocodone/ Acetaminphen, as pt. is almost out of medication, and will need enough to get through the weekend, prior to surgery for Right AKA on Monday, 2/2.   Dr. Oneida Alar authorized a refill of pain medication.  Daughter notified to pick up at office.

## 2014-01-08 ENCOUNTER — Other Ambulatory Visit (HOSPITAL_COMMUNITY): Payer: Self-pay | Admitting: *Deleted

## 2014-01-08 ENCOUNTER — Encounter (HOSPITAL_COMMUNITY): Payer: Self-pay | Admitting: *Deleted

## 2014-01-10 MED ORDER — DEXTROSE 5 % IV SOLN
1.5000 g | INTRAVENOUS | Status: AC
  Administered 2014-01-11: 1.5 g via INTRAVENOUS
  Filled 2014-01-10: qty 1.5

## 2014-01-11 ENCOUNTER — Inpatient Hospital Stay (HOSPITAL_COMMUNITY): Payer: Medicare Other

## 2014-01-11 ENCOUNTER — Encounter (HOSPITAL_COMMUNITY): Payer: Medicare Other | Admitting: Certified Registered Nurse Anesthetist

## 2014-01-11 ENCOUNTER — Inpatient Hospital Stay (HOSPITAL_COMMUNITY)
Admission: RE | Admit: 2014-01-11 | Discharge: 2014-01-14 | DRG: 239 | Disposition: A | Discharge status: Rehabilitation Facility | Payer: Medicare Other | Source: Ambulatory Visit | Attending: Vascular Surgery | Admitting: Vascular Surgery

## 2014-01-11 ENCOUNTER — Inpatient Hospital Stay (HOSPITAL_COMMUNITY): Payer: Medicare Other | Admitting: Certified Registered Nurse Anesthetist

## 2014-01-11 ENCOUNTER — Encounter: Payer: Medicare Other | Admitting: Surgery

## 2014-01-11 ENCOUNTER — Encounter (HOSPITAL_COMMUNITY): Payer: Self-pay | Admitting: Surgery

## 2014-01-11 ENCOUNTER — Encounter (HOSPITAL_COMMUNITY)
Admission: RE | Disposition: A | Discharge status: Rehabilitation Facility | Payer: Self-pay | Source: Ambulatory Visit | Attending: Vascular Surgery

## 2014-01-11 DIAGNOSIS — E785 Hyperlipidemia, unspecified: Secondary | ICD-10-CM | POA: Diagnosis present

## 2014-01-11 DIAGNOSIS — K449 Diaphragmatic hernia without obstruction or gangrene: Secondary | ICD-10-CM | POA: Diagnosis present

## 2014-01-11 DIAGNOSIS — Z803 Family history of malignant neoplasm of breast: Secondary | ICD-10-CM

## 2014-01-11 DIAGNOSIS — I428 Other cardiomyopathies: Secondary | ICD-10-CM | POA: Diagnosis present

## 2014-01-11 DIAGNOSIS — I70269 Atherosclerosis of native arteries of extremities with gangrene, unspecified extremity: Secondary | ICD-10-CM

## 2014-01-11 DIAGNOSIS — I872 Venous insufficiency (chronic) (peripheral): Secondary | ICD-10-CM | POA: Diagnosis present

## 2014-01-11 DIAGNOSIS — L8992 Pressure ulcer of unspecified site, stage 2: Secondary | ICD-10-CM | POA: Diagnosis present

## 2014-01-11 DIAGNOSIS — E039 Hypothyroidism, unspecified: Secondary | ICD-10-CM | POA: Diagnosis present

## 2014-01-11 DIAGNOSIS — IMO0002 Reserved for concepts with insufficient information to code with codable children: Secondary | ICD-10-CM

## 2014-01-11 DIAGNOSIS — I70229 Atherosclerosis of native arteries of extremities with rest pain, unspecified extremity: Secondary | ICD-10-CM | POA: Diagnosis present

## 2014-01-11 DIAGNOSIS — D509 Iron deficiency anemia, unspecified: Secondary | ICD-10-CM | POA: Diagnosis present

## 2014-01-11 DIAGNOSIS — Z8249 Family history of ischemic heart disease and other diseases of the circulatory system: Secondary | ICD-10-CM

## 2014-01-11 DIAGNOSIS — I509 Heart failure, unspecified: Secondary | ICD-10-CM | POA: Diagnosis present

## 2014-01-11 DIAGNOSIS — I739 Peripheral vascular disease, unspecified: Secondary | ICD-10-CM | POA: Diagnosis present

## 2014-01-11 DIAGNOSIS — Z860100 Personal history of colon polyps, unspecified: Secondary | ICD-10-CM

## 2014-01-11 DIAGNOSIS — K219 Gastro-esophageal reflux disease without esophagitis: Secondary | ICD-10-CM | POA: Diagnosis present

## 2014-01-11 DIAGNOSIS — L97909 Non-pressure chronic ulcer of unspecified part of unspecified lower leg with unspecified severity: Secondary | ICD-10-CM | POA: Diagnosis present

## 2014-01-11 DIAGNOSIS — I2789 Other specified pulmonary heart diseases: Secondary | ICD-10-CM | POA: Diagnosis present

## 2014-01-11 DIAGNOSIS — Z79899 Other long term (current) drug therapy: Secondary | ICD-10-CM

## 2014-01-11 DIAGNOSIS — M129 Arthropathy, unspecified: Secondary | ICD-10-CM | POA: Diagnosis present

## 2014-01-11 DIAGNOSIS — R64 Cachexia: Secondary | ICD-10-CM | POA: Diagnosis present

## 2014-01-11 DIAGNOSIS — I69998 Other sequelae following unspecified cerebrovascular disease: Secondary | ICD-10-CM

## 2014-01-11 DIAGNOSIS — G4730 Sleep apnea, unspecified: Secondary | ICD-10-CM | POA: Diagnosis present

## 2014-01-11 DIAGNOSIS — E876 Hypokalemia: Secondary | ICD-10-CM | POA: Diagnosis not present

## 2014-01-11 DIAGNOSIS — L89109 Pressure ulcer of unspecified part of back, unspecified stage: Secondary | ICD-10-CM | POA: Diagnosis present

## 2014-01-11 DIAGNOSIS — I2510 Atherosclerotic heart disease of native coronary artery without angina pectoris: Secondary | ICD-10-CM | POA: Diagnosis present

## 2014-01-11 DIAGNOSIS — M109 Gout, unspecified: Secondary | ICD-10-CM | POA: Diagnosis present

## 2014-01-11 DIAGNOSIS — H539 Unspecified visual disturbance: Secondary | ICD-10-CM

## 2014-01-11 DIAGNOSIS — N184 Chronic kidney disease, stage 4 (severe): Secondary | ICD-10-CM | POA: Diagnosis present

## 2014-01-11 DIAGNOSIS — R29898 Other symptoms and signs involving the musculoskeletal system: Secondary | ICD-10-CM | POA: Diagnosis present

## 2014-01-11 DIAGNOSIS — I129 Hypertensive chronic kidney disease with stage 1 through stage 4 chronic kidney disease, or unspecified chronic kidney disease: Secondary | ICD-10-CM | POA: Diagnosis present

## 2014-01-11 DIAGNOSIS — J4489 Other specified chronic obstructive pulmonary disease: Secondary | ICD-10-CM | POA: Diagnosis present

## 2014-01-11 DIAGNOSIS — Z8711 Personal history of peptic ulcer disease: Secondary | ICD-10-CM

## 2014-01-11 DIAGNOSIS — Z9981 Dependence on supplemental oxygen: Secondary | ICD-10-CM

## 2014-01-11 DIAGNOSIS — E43 Unspecified severe protein-calorie malnutrition: Secondary | ICD-10-CM | POA: Diagnosis present

## 2014-01-11 DIAGNOSIS — J449 Chronic obstructive pulmonary disease, unspecified: Secondary | ICD-10-CM | POA: Diagnosis present

## 2014-01-11 HISTORY — PX: AMPUTATION: SHX166

## 2014-01-11 HISTORY — DX: Family history of other specified conditions: Z84.89

## 2014-01-11 HISTORY — DX: Disease of intestine, unspecified: K63.9

## 2014-01-11 LAB — BASIC METABOLIC PANEL
BUN: 125 mg/dL — ABNORMAL HIGH (ref 6–23)
BUN: 114 mg/dL — ABNORMAL HIGH (ref 6–23)
CALCIUM: 8.3 mg/dL — AB (ref 8.4–10.5)
CHLORIDE: 97 meq/L (ref 96–112)
CO2: 26 mEq/L (ref 19–32)
CO2: 21 mEq/L (ref 19–32)
CREATININE: 3.39 mg/dL — AB (ref 0.50–1.10)
Calcium: 7.7 mg/dL — ABNORMAL LOW (ref 8.4–10.5)
Chloride: 94 mEq/L — ABNORMAL LOW (ref 96–112)
Creatinine, Ser: 3.76 mg/dL — ABNORMAL HIGH (ref 0.50–1.10)
GFR calc Af Amer: 12 mL/min — ABNORMAL LOW (ref >90)
GFR calc non Af Amer: 12 mL/min — ABNORMAL LOW (ref >90)
GFR, EST AFRICAN AMERICAN: 14 mL/min — AB (ref >90)
GFR, EST NON AFRICAN AMERICAN: 10 mL/min — AB (ref >90)
Glucose, Bld: 82 mg/dL (ref 70–99)
Glucose, Bld: 102 mg/dL — ABNORMAL HIGH (ref 70–99)
Potassium: 2.8 mEq/L — CL (ref 3.7–5.3)
Potassium: 3.4 mEq/L — ABNORMAL LOW (ref 3.7–5.3)
SODIUM: 142 meq/L (ref 137–147)
Sodium: 140 mEq/L (ref 137–147)

## 2014-01-11 LAB — CBC
HCT: 34.8 % — ABNORMAL LOW (ref 36.0–46.0)
HCT: 32.2 % — ABNORMAL LOW (ref 36.0–46.0)
Hemoglobin: 10.9 g/dL — ABNORMAL LOW (ref 12.0–15.0)
Hemoglobin: 10.1 g/dL — ABNORMAL LOW (ref 12.0–15.0)
MCH: 32.1 pg (ref 26.0–34.0)
MCH: 32 pg (ref 26.0–34.0)
MCHC: 31.3 g/dL (ref 30.0–36.0)
MCHC: 31.4 g/dL (ref 30.0–36.0)
MCV: 102.4 fL — ABNORMAL HIGH (ref 78.0–100.0)
MCV: 101.9 fL — ABNORMAL HIGH (ref 78.0–100.0)
PLATELETS: 200 10*3/uL (ref 150–400)
PLATELETS: 199 10*3/uL (ref 150–400)
RBC: 3.4 MIL/uL — ABNORMAL LOW (ref 3.87–5.11)
RBC: 3.16 MIL/uL — ABNORMAL LOW (ref 3.87–5.11)
RDW: 17.6 % — AB (ref 11.5–15.5)
RDW: 17.4 % — AB (ref 11.5–15.5)
WBC: 6.4 10*3/uL (ref 4.0–10.5)
WBC: 7.7 10*3/uL (ref 4.0–10.5)

## 2014-01-11 LAB — CREATININE, SERUM
Creatinine, Ser: 3.48 mg/dL — ABNORMAL HIGH (ref 0.50–1.10)
GFR, EST AFRICAN AMERICAN: 13 mL/min — AB (ref >90)
GFR, EST NON AFRICAN AMERICAN: 11 mL/min — AB (ref >90)

## 2014-01-11 LAB — MRSA PCR SCREENING: MRSA BY PCR: NEGATIVE

## 2014-01-11 SURGERY — AMPUTATION, ABOVE KNEE
Anesthesia: General | Site: Leg Upper | Laterality: Right

## 2014-01-11 MED ORDER — POTASSIUM CHLORIDE 10 MEQ/100ML IV SOLN
10.0000 meq | INTRAVENOUS | Status: AC
  Administered 2014-01-11 – 2014-01-12 (×3): 10 meq via INTRAVENOUS
  Filled 2014-01-11 (×3): qty 100

## 2014-01-11 MED ORDER — PHENOL 1.4 % MT LIQD
1.0000 | OROMUCOSAL | Status: DC | PRN
  Filled 2014-01-11: qty 177

## 2014-01-11 MED ORDER — LEVOTHYROXINE SODIUM 75 MCG PO TABS
75.0000 ug | ORAL_TABLET | Freq: Every day | ORAL | Status: DC
  Administered 2014-01-12 – 2014-01-14 (×3): 75 ug via ORAL
  Filled 2014-01-11 (×4): qty 1

## 2014-01-11 MED ORDER — ACETAMINOPHEN 325 MG PO TABS
325.0000 mg | ORAL_TABLET | ORAL | Status: DC | PRN
  Administered 2014-01-14: 650 mg via ORAL
  Filled 2014-01-11: qty 2

## 2014-01-11 MED ORDER — HYDROMORPHONE HCL PF 1 MG/ML IJ SOLN
INTRAMUSCULAR | Status: AC
  Administered 2014-01-11: 0.25 mg via INTRAVENOUS
  Filled 2014-01-11: qty 1

## 2014-01-11 MED ORDER — STERILE WATER FOR INJECTION IJ SOLN
INTRAMUSCULAR | Status: AC
  Filled 2014-01-11: qty 10

## 2014-01-11 MED ORDER — ONDANSETRON HCL 4 MG/2ML IJ SOLN
INTRAMUSCULAR | Status: DC | PRN
Start: 2014-01-11 — End: 2014-01-11
  Administered 2014-01-11: 4 mg via INTRAVENOUS

## 2014-01-11 MED ORDER — EPHEDRINE SULFATE 50 MG/ML IJ SOLN
INTRAMUSCULAR | Status: DC | PRN
  Administered 2014-01-11: 10 mg via INTRAVENOUS
  Administered 2014-01-11 (×2): 5 mg via INTRAVENOUS
  Administered 2014-01-11: 10 mg via INTRAVENOUS

## 2014-01-11 MED ORDER — BISACODYL 10 MG RE SUPP: 10.0000 mg | Freq: Every day | RECTAL | Status: DC | PRN

## 2014-01-11 MED ORDER — LABETALOL HCL 5 MG/ML IV SOLN
10.0000 mg | INTRAVENOUS | Status: DC | PRN
  Filled 2014-01-11: qty 4

## 2014-01-11 MED ORDER — ALUM & MAG HYDROXIDE-SIMETH 200-200-20 MG/5ML PO SUSP: 15.0000 mL | ORAL | Status: DC | PRN

## 2014-01-11 MED ORDER — LIDOCAINE HCL (CARDIAC) 20 MG/ML IV SOLN
INTRAVENOUS | Status: DC | PRN
  Administered 2014-01-11: 60 mg via INTRAVENOUS

## 2014-01-11 MED ORDER — ARTIFICIAL TEARS OP OINT
TOPICAL_OINTMENT | OPHTHALMIC | Status: AC
  Filled 2014-01-11: qty 3.5

## 2014-01-11 MED ORDER — POTASSIUM CHLORIDE 10 MEQ/100ML IV SOLN
10.0000 meq | INTRAVENOUS | Status: AC
  Administered 2014-01-11 (×4): 10 meq via INTRAVENOUS
  Filled 2014-01-11 (×4): qty 100

## 2014-01-11 MED ORDER — VITAMIN C 500 MG PO TABS
500.0000 mg | ORAL_TABLET | Freq: Every day | ORAL | Status: DC
  Administered 2014-01-12 – 2014-01-14 (×3): 500 mg via ORAL
  Filled 2014-01-11 (×4): qty 1

## 2014-01-11 MED ORDER — SENNOSIDES-DOCUSATE SODIUM 8.6-50 MG PO TABS
1.0000 | ORAL_TABLET | Freq: Every evening | ORAL | Status: DC | PRN
  Filled 2014-01-11: qty 1

## 2014-01-11 MED ORDER — FLEET ENEMA 7-19 GM/118ML RE ENEM
1.0000 | ENEMA | Freq: Once | RECTAL | Status: AC | PRN
  Filled 2014-01-11: qty 1

## 2014-01-11 MED ORDER — ONDANSETRON HCL 4 MG/2ML IJ SOLN: 4.0000 mg | Freq: Once | INTRAMUSCULAR | Status: DC | PRN

## 2014-01-11 MED ORDER — EPHEDRINE SULFATE 50 MG/ML IJ SOLN
INTRAMUSCULAR | Status: AC
  Filled 2014-01-11: qty 1

## 2014-01-11 MED ORDER — GLYCOPYRROLATE 0.2 MG/ML IJ SOLN
INTRAMUSCULAR | Status: AC
  Filled 2014-01-11: qty 3

## 2014-01-11 MED ORDER — METOPROLOL TARTRATE 1 MG/ML IV SOLN: 2.0000 mg | INTRAVENOUS | Status: DC | PRN

## 2014-01-11 MED ORDER — ONDANSETRON HCL 4 MG/2ML IJ SOLN
4.0000 mg | Freq: Four times a day (QID) | INTRAMUSCULAR | Status: DC | PRN
Start: 2014-01-11 — End: 2014-01-14

## 2014-01-11 MED ORDER — HYDROMORPHONE HCL PF 1 MG/ML IJ SOLN
0.2500 mg | INTRAMUSCULAR | Status: DC | PRN
  Administered 2014-01-11 (×5): 0.25 mg via INTRAVENOUS
  Administered 2014-01-11: 0.5 mg via INTRAVENOUS

## 2014-01-11 MED ORDER — CALCITRIOL 0.5 MCG PO CAPS
0.5000 ug | ORAL_CAPSULE | ORAL | Status: DC
  Administered 2014-01-12 – 2014-01-14 (×2): 0.5 ug via ORAL
  Filled 2014-01-11 (×2): qty 1

## 2014-01-11 MED ORDER — PANTOPRAZOLE SODIUM 40 MG PO TBEC
40.0000 mg | DELAYED_RELEASE_TABLET | Freq: Every day | ORAL | Status: DC
  Administered 2014-01-12 – 2014-01-14 (×3): 40 mg via ORAL
  Filled 2014-01-11 (×3): qty 1

## 2014-01-11 MED ORDER — LIDOCAINE HCL 4 % MT SOLN
OROMUCOSAL | Status: DC | PRN
  Administered 2014-01-11: 4 mL via TOPICAL

## 2014-01-11 MED ORDER — ROCURONIUM BROMIDE 50 MG/5ML IV SOLN
INTRAVENOUS | Status: AC
  Filled 2014-01-11: qty 1

## 2014-01-11 MED ORDER — ACETAMINOPHEN 650 MG RE SUPP: 325.0000 mg | RECTAL | Status: DC | PRN

## 2014-01-11 MED ORDER — DEXTROSE 5 % IV SOLN
1.5000 g | Freq: Two times a day (BID) | INTRAVENOUS | Status: DC
  Administered 2014-01-11: 1.5 g via INTRAVENOUS
  Filled 2014-01-11 (×2): qty 1.5

## 2014-01-11 MED ORDER — FENTANYL CITRATE 0.05 MG/ML IJ SOLN
INTRAMUSCULAR | Status: AC
  Filled 2014-01-11: qty 5

## 2014-01-11 MED ORDER — FENTANYL CITRATE 0.05 MG/ML IJ SOLN
50.0000 ug | Freq: Once | INTRAMUSCULAR | Status: AC
  Administered 2014-01-11: 50 ug via INTRAVENOUS

## 2014-01-11 MED ORDER — PROPOFOL 10 MG/ML IV BOLUS
INTRAVENOUS | Status: AC
  Filled 2014-01-11: qty 20

## 2014-01-11 MED ORDER — SODIUM CHLORIDE 0.9 % IV SOLN
INTRAVENOUS | Status: DC
  Administered 2014-01-11 – 2014-01-12 (×2): via INTRAVENOUS

## 2014-01-11 MED ORDER — METOLAZONE 2.5 MG PO TABS
2.5000 mg | ORAL_TABLET | ORAL | Status: DC
  Administered 2014-01-11 – 2014-01-13 (×2): 2.5 mg via ORAL
  Filled 2014-01-11 (×2): qty 1

## 2014-01-11 MED ORDER — CEFAZOLIN SODIUM-DEXTROSE 2-3 GM-% IV SOLR
INTRAVENOUS | Status: AC
  Filled 2014-01-11: qty 50

## 2014-01-11 MED ORDER — MORPHINE SULFATE 2 MG/ML IJ SOLN
2.0000 mg | INTRAMUSCULAR | Status: DC | PRN
  Administered 2014-01-11 (×4): 2 mg via INTRAVENOUS
  Administered 2014-01-12: 4 mg via INTRAVENOUS
  Filled 2014-01-11 (×4): qty 1
  Filled 2014-01-11: qty 2
  Filled 2014-01-11: qty 1

## 2014-01-11 MED ORDER — OXYCODONE HCL 5 MG PO TABS
5.0000 mg | ORAL_TABLET | ORAL | Status: DC | PRN
  Administered 2014-01-11: 10 mg via ORAL
  Administered 2014-01-11 – 2014-01-12 (×5): 5 mg via ORAL
  Administered 2014-01-13: 10 mg via ORAL
  Administered 2014-01-13: 5 mg via ORAL
  Administered 2014-01-13 – 2014-01-14 (×3): 10 mg via ORAL
  Filled 2014-01-11: qty 2
  Filled 2014-01-11 (×2): qty 1
  Filled 2014-01-11: qty 2
  Filled 2014-01-11 (×2): qty 1
  Filled 2014-01-11: qty 2
  Filled 2014-01-11: qty 1
  Filled 2014-01-11: qty 2
  Filled 2014-01-11: qty 1
  Filled 2014-01-11: qty 2

## 2014-01-11 MED ORDER — LOSARTAN POTASSIUM 50 MG PO TABS
50.0000 mg | ORAL_TABLET | Freq: Every day | ORAL | Status: DC
  Administered 2014-01-12 – 2014-01-14 (×3): 50 mg via ORAL
  Filled 2014-01-11 (×4): qty 1

## 2014-01-11 MED ORDER — TORSEMIDE 20 MG PO TABS
140.0000 mg | ORAL_TABLET | Freq: Every day | ORAL | Status: DC
  Administered 2014-01-12 – 2014-01-14 (×3): 140 mg via ORAL
  Filled 2014-01-11 (×4): qty 2

## 2014-01-11 MED ORDER — NEOSTIGMINE METHYLSULFATE 1 MG/ML IJ SOLN
INTRAMUSCULAR | Status: AC
  Filled 2014-01-11: qty 10

## 2014-01-11 MED ORDER — 0.9 % SODIUM CHLORIDE (POUR BTL) OPTIME
TOPICAL | Status: DC | PRN
  Administered 2014-01-11: 1000 mL

## 2014-01-11 MED ORDER — GUAIFENESIN-DM 100-10 MG/5ML PO SYRP: 15.0000 mL | ORAL_SOLUTION | ORAL | Status: DC | PRN

## 2014-01-11 MED ORDER — SODIUM CHLORIDE 0.9 % IV SOLN
INTRAVENOUS | Status: DC
  Administered 2014-01-11 (×2): via INTRAVENOUS

## 2014-01-11 MED ORDER — DOCUSATE SODIUM 100 MG PO CAPS
100.0000 mg | ORAL_CAPSULE | Freq: Every day | ORAL | Status: DC
  Administered 2014-01-12 – 2014-01-14 (×3): 100 mg via ORAL
  Filled 2014-01-11 (×3): qty 1

## 2014-01-11 MED ORDER — ARTIFICIAL TEARS OP OINT
TOPICAL_OINTMENT | OPHTHALMIC | Status: DC | PRN
  Administered 2014-01-11: 1 via OPHTHALMIC

## 2014-01-11 MED ORDER — ENOXAPARIN SODIUM 30 MG/0.3ML ~~LOC~~ SOLN
30.0000 mg | SUBCUTANEOUS | Status: DC
  Administered 2014-01-11 – 2014-01-14 (×4): 30 mg via SUBCUTANEOUS
  Filled 2014-01-11 (×5): qty 0.3

## 2014-01-11 MED ORDER — ONDANSETRON HCL 4 MG/2ML IJ SOLN
INTRAMUSCULAR | Status: AC
  Filled 2014-01-11: qty 2

## 2014-01-11 MED ORDER — FENTANYL CITRATE 0.05 MG/ML IJ SOLN
INTRAMUSCULAR | Status: DC | PRN
  Administered 2014-01-11: 100 ug via INTRAVENOUS
  Administered 2014-01-11: 50 ug via INTRAVENOUS

## 2014-01-11 MED ORDER — ALLOPURINOL 100 MG PO TABS
100.0000 mg | ORAL_TABLET | ORAL | Status: DC
  Administered 2014-01-13: 100 mg via ORAL
  Filled 2014-01-11: qty 1

## 2014-01-11 MED ORDER — FENTANYL CITRATE 0.05 MG/ML IJ SOLN
INTRAMUSCULAR | Status: AC
  Administered 2014-01-11: 50 ug via INTRAVENOUS
  Filled 2014-01-11: qty 2

## 2014-01-11 MED ORDER — MIDAZOLAM HCL 2 MG/2ML IJ SOLN
INTRAMUSCULAR | Status: AC
  Filled 2014-01-11: qty 2

## 2014-01-11 MED ORDER — POTASSIUM CHLORIDE 40 MEQ/15ML (20%) PO LIQD: 40.0000 meq | Freq: Every day | ORAL | Status: DC

## 2014-01-11 MED ORDER — PROPOFOL 10 MG/ML IV BOLUS
INTRAVENOUS | Status: DC | PRN
  Administered 2014-01-11: 90 mg via INTRAVENOUS

## 2014-01-11 MED ORDER — LIDOCAINE HCL (CARDIAC) 20 MG/ML IV SOLN
INTRAVENOUS | Status: AC
  Filled 2014-01-11: qty 5

## 2014-01-11 MED ORDER — HYDRALAZINE HCL 20 MG/ML IJ SOLN: 10.0000 mg | INTRAMUSCULAR | Status: DC | PRN

## 2014-01-11 SURGICAL SUPPLY — 51 items
BANDAGE ELASTIC 4 VELCRO ST LF (GAUZE/BANDAGES/DRESSINGS) ×1 IMPLANT
BANDAGE ELASTIC 6 VELCRO ST LF (GAUZE/BANDAGES/DRESSINGS) ×3 IMPLANT
BANDAGE ESMARK 6X9 LF (GAUZE/BANDAGES/DRESSINGS) ×1 IMPLANT
BANDAGE GAUZE ELAST BULKY 4 IN (GAUZE/BANDAGES/DRESSINGS) ×4 IMPLANT
BLADE SAW SAG 29X58X.64 (BLADE) ×3 IMPLANT
BNDG CMPR 9X6 STRL LF SNTH (GAUZE/BANDAGES/DRESSINGS) ×1
BNDG COHESIVE 6X5 TAN STRL LF (GAUZE/BANDAGES/DRESSINGS) ×3 IMPLANT
BNDG ESMARK 6X9 LF (GAUZE/BANDAGES/DRESSINGS) ×3
CANISTER SUCTION 2500CC (MISCELLANEOUS) ×3 IMPLANT
CLIP TI MEDIUM 6 (CLIP) ×3 IMPLANT
COVER SURGICAL LIGHT HANDLE (MISCELLANEOUS) ×3 IMPLANT
COVER TABLE BACK 60X90 (DRAPES) ×3 IMPLANT
DRAIN CHANNEL 19F RND (DRAIN) IMPLANT
DRAPE ORTHO SPLIT 77X108 STRL (DRAPES) ×6
DRAPE PROXIMA HALF (DRAPES) ×3 IMPLANT
DRAPE SURG ORHT 6 SPLT 77X108 (DRAPES) ×2 IMPLANT
DRAPE U-SHAPE 47X51 STRL (DRAPES) ×3 IMPLANT
DRSG ADAPTIC 3X8 NADH LF (GAUZE/BANDAGES/DRESSINGS) ×3 IMPLANT
ELECT REM PT RETURN 9FT ADLT (ELECTROSURGICAL) ×3
ELECTRODE REM PT RTRN 9FT ADLT (ELECTROSURGICAL) ×1 IMPLANT
EVACUATOR SILICONE 100CC (DRAIN) IMPLANT
GLOVE BIO SURGEON STRL SZ 6.5 (GLOVE) ×2 IMPLANT
GLOVE BIO SURGEON STRL SZ7 (GLOVE) ×5 IMPLANT
GLOVE BIO SURGEONS STRL SZ 6.5 (GLOVE) ×2
GLOVE BIOGEL PI IND STRL 7.5 (GLOVE) ×1 IMPLANT
GLOVE BIOGEL PI INDICATOR 7.5 (GLOVE) ×2
GOWN STRL REUS W/ TWL LRG LVL3 (GOWN DISPOSABLE) ×3 IMPLANT
GOWN STRL REUS W/ TWL XL LVL3 (GOWN DISPOSABLE) IMPLANT
GOWN STRL REUS W/TWL LRG LVL3 (GOWN DISPOSABLE) ×3
GOWN STRL REUS W/TWL XL LVL3 (GOWN DISPOSABLE) ×6
KIT BASIN OR (CUSTOM PROCEDURE TRAY) ×3 IMPLANT
KIT ROOM TURNOVER OR (KITS) ×3 IMPLANT
NS IRRIG 1000ML POUR BTL (IV SOLUTION) ×3 IMPLANT
PACK GENERAL/GYN (CUSTOM PROCEDURE TRAY) ×3 IMPLANT
PAD ARMBOARD 7.5X6 YLW CONV (MISCELLANEOUS) ×6 IMPLANT
SPONGE GAUZE 4X4 12PLY (GAUZE/BANDAGES/DRESSINGS) ×4 IMPLANT
SPONGE GAUZE 4X4 12PLY STER LF (GAUZE/BANDAGES/DRESSINGS) ×2 IMPLANT
STAPLER VISISTAT 35W (STAPLE) ×3 IMPLANT
STOCKINETTE IMPERVIOUS LG (DRAPES) ×3 IMPLANT
SUT BONE WAX W31G (SUTURE) ×2 IMPLANT
SUT ETHILON 3 0 PS 1 (SUTURE) IMPLANT
SUT SILK 0 TIES 10X30 (SUTURE) ×3 IMPLANT
SUT SILK 2 0 (SUTURE) ×3
SUT SILK 2-0 18XBRD TIE 12 (SUTURE) ×1 IMPLANT
SUT VIC AB 2-0 CT1 18 (SUTURE) ×8 IMPLANT
SUT VIC AB 3-0 SH 18 (SUTURE) IMPLANT
TAPE PAPER 3X10 WHT MICROPORE (GAUZE/BANDAGES/DRESSINGS) ×2 IMPLANT
TOWEL OR 17X24 6PK STRL BLUE (TOWEL DISPOSABLE) ×3 IMPLANT
TOWEL OR 17X26 10 PK STRL BLUE (TOWEL DISPOSABLE) ×3 IMPLANT
UNDERPAD 30X30 INCONTINENT (UNDERPADS AND DIAPERS) ×3 IMPLANT
WATER STERILE IRR 1000ML POUR (IV SOLUTION) ×3 IMPLANT

## 2014-01-11 NOTE — Anesthesia Procedure Notes (Signed)
Procedure Name: Intubation Date/Time: 01/11/2014 9:46 AM Performed by: [place] Pink Pre-anesthesia Checklist: Patient identified, Timeout performed, Emergency Drugs available, Suction available and Patient being monitored Patient Re-evaluated:Patient Re-evaluated prior to inductionOxygen Delivery Method: Circle system utilized Preoxygenation: Pre-oxygenation with 100% oxygen Intubation Type: IV induction Ventilation: Mask ventilation without difficulty Laryngoscope Size: Mac and 3 Grade View: Grade I Tube type: Oral Tube size: 7.0 mm Number of attempts: 1 Airway Equipment and Method: Stylet and LTA kit utilized Placement Confirmation: ETT inserted through vocal cords under direct vision,  positive ETCO2 and breath sounds checked- equal and bilateral Secured at: 19 cm Tube secured with: Tape Dental Injury: Teeth and Oropharynx as per pre-operative assessment

## 2014-01-11 NOTE — Anesthesia Postprocedure Evaluation (Signed)
  Anesthesia Post-op Note  Patient: [name]  Procedure(s) Performed: Procedure(s): AMPUTATION ABOVE KNEE-RIGHT (Right)  Patient Location: PACU  Anesthesia Type:General  Level of Consciousness: awake, oriented, sedated and patient cooperative  Airway and Oxygen Therapy: Patient Spontanous Breathing  Post-op Pain: mild  Post-op Assessment: Post-op Vital signs reviewed, Patient's Cardiovascular Status Stable, Respiratory Function Stable, Patent Airway, No signs of Nausea or vomiting and Pain level controlled  Post-op Vital Signs: stable  Complications: No apparent anesthesia complications

## 2014-01-11 NOTE — Anesthesia Preprocedure Evaluation (Signed)
Anesthesia Evaluation  Patient identified by MRN, date of birth, ID band Patient awake    Reviewed: Allergy & Precautions, H&P , NPO status , Patient's Chart, lab work & pertinent test results  Airway       Dental   Pulmonary sleep apnea , COPD         Cardiovascular hypertension, + CAD, + Peripheral Vascular Disease and +CHF + dysrhythmias Atrial Fibrillation     Neuro/Psych  Neuromuscular disease CVA, Residual Symptoms    GI/Hepatic hiatal hernia, PUD, GERD-  ,  Endo/Other  Hypothyroidism   Renal/GU CRFRenal disease     Musculoskeletal   Abdominal   Peds  Hematology  (+) anemia ,   Anesthesia Other Findings   Reproductive/Obstetrics                           Anesthesia Physical Anesthesia Plan  ASA: III  Anesthesia Plan: General   Post-op Pain Management:    Induction: Intravenous  Airway Management Planned: Oral ETT  Additional Equipment:   Intra-op Plan:   Post-operative Plan: Extubation in OR  Informed Consent: I have reviewed the patients History and Physical, chart, labs and discussed the procedure including the risks, benefits and alternatives for the proposed anesthesia with the patient or authorized representative who has indicated his/her understanding and acceptance.     Plan Discussed with:   Anesthesia Plan Comments:         Anesthesia Quick Evaluation

## 2014-01-11 NOTE — Progress Notes (Signed)
Lab called Nurse and informed me that patients potassium was 2.8. Nurse then called Dr. Sherren Kerns (anesthesia) and informed him of low potassium level and also informed him that patient was having pain in her right leg and rated it as "10" out of "10." Dr. Tamala Julian ordered for patient to have 50 mcg of Fentanyl and 4 bags of potassium. Will enter orders.

## 2014-01-11 NOTE — Interval H&P Note (Signed)
[facility]  History and Physical Update  The patient was interviewed and re-examined.  The patient's previous History and Physical has been reviewed and is unchanged except for: interval angiogram demonstrating no intervention options.  The patient and family have elected to proceed with palliative Right above-knee amputation for pain control.  I discussed in depth the nature of right above-the-knee amputation with the patient, including risks, benefits, and alternatives.  The patient is aware that the risks of above-the-knee amputation include but are not limited to: bleeding, infection, myocardial infarction, stroke, death, failure to heal amputation wound, and possible need for more proximal amputation.  The patient is aware of the risks and agrees proceed forward with the procedure.   Adele Barthel, MD [facility] Office: [phone] Pager: [phone]  01/11/2014, 9:11 AM

## 2014-01-11 NOTE — Transfer of Care (Signed)
Immediate Anesthesia Transfer of Care Note  Patient: [name]  Procedure(s) Performed: Procedure(s): AMPUTATION ABOVE KNEE-RIGHT (Right)  Patient Location: PACU  Anesthesia Type:General  Level of Consciousness: awake, alert  and oriented  Airway & Oxygen Therapy: Patient Spontanous Breathing and Patient connected to nasal cannula oxygen  Post-op Assessment: Report given to PACU RN and Post -op Vital signs reviewed and stable  Post vital signs: Reviewed and stable  Complications: No apparent anesthesia complications

## 2014-01-11 NOTE — Op Note (Signed)
    OPERATIVE NOTE   PROCEDURE: right above-the-knee amputation  PRE-OPERATIVE DIAGNOSIS: right leg gangrene  POST-OPERATIVE DIAGNOSIS: same as above  SURGEON: Adele Barthel, MD  ANESTHESIA: general  ESTIMATED BLOOD LOSS: 50 cc  FINDING(S): 1.  Viable muscle in right thigh 2.  Heavily calcified right superficial femoral artery   SPECIMEN(S):  right above-the-knee amputation  INDICATIONS:   [name] is a 80 y.o. female who presents with right leg gangrene with rest pain.  The patient is scheduled for a right above-the-knee amputation.  I discussed in depth with the patient the risks, benefits, and alternatives to this procedure.  The patient is aware that the risk of this operation included but are not limited to:  bleeding, infection, myocardial infarction, stroke, death, failure to heal amputation wound, and possible need for more proximal amputation.  The patient is aware of the risks and agrees proceed forward with the procedure.  DESCRIPTION: After full informed written consent was obtained from the patient, the patient was brought back to the operating room, and placed supine upon the operating table.  Prior to induction, the patient received IV antibiotics.  The patient was then prepped and draped in the standard fashion for an above-the-knee amputation.  I placed a non-sterile tourniquet on the thigh prior to the procedure.  After obtaining adequate anesthesia, the patient was prepped and draped in the standard fashion for a above-the-knee amputation.  I marked out the anterior and posterior flaps for a fish-mouth type of amputation.  I exsanguinated the leg with an Esmarch bandage and then inflated the tourniquet to 250 mm Hg.   I made the incisions for these flaps, and then dissected through the subcutaneous tissue, fascia, and muscles circumferentially.  I elevated  the periosteal tissue 4 cm more proximal than the anterior skin flap.  I then transected the femur with a  power saw at this level.  Then I smoothed out the rough edges of the bone with a rasp.  At this point, the specimen was passed off the field as the above-the-knee amputation.  At this point, I clamped all visibly bleeding arteries and veins using a combination of suture ligation with Vicryl suture and electrocautery.  The tourniquet was then deflated at this point.  Bleeding continued to be controlled with electrocautery and suture ligature.  The stump was washed off with sterile normal saline and no further active bleeding was noted.  I reapproximated the anterior and posterior fascia  with interrupted stitches of 2-0 Vicryl.  This was completed along the entire length of anterior and posterior fascia until there were no more loose space in the fascial line.  The skin was then  reapproximated with staples.  The stump was washed off and dried.  The incision was dressed with [employer] and  then fluffs were applied.  Kerlix was wrapped around the leg and then gently an ACE wrap was applied.    COMPLICATIONS: none  CONDITION: stable   Adele Barthel, MD [facility] Office: [phone] Pager: [phone]  01/11/2014, 9:39 AM

## 2014-01-11 NOTE — Interval H&P Note (Signed)
[facility]  History and Physical Update  The patient was interviewed and re-examined.  The patient's previous History and Physical has been reviewed and is unchanged.  There is no change in the plan of care: R AKA.Adele Barthel, MD [facility] [place] Office: [phone] Pager: [phone]  01/11/2014, 7:32 AM

## 2014-01-11 NOTE — H&P (View-Only) (Signed)
  Referred by:  William F Hopper, MD [address] [place], [place] [address]  Reason for referral: Right leg pain  History of Present Illness  [name] is a 80 y.o. (08/29/1929) female who presents with chief complaint: Bilateral leg pain (R>>L).  Onset of symptom occurred in Nov 2014.  Pain is described as sharp, severity 10/10, and associated with manipulating the right leg.  Patient has attempted to treat this pain with Tramadol and rest.  The patient has intermittent rest pain symptoms also and right medial leg ulcers which started the last week with serous drainage from them.  The patient denies any fever or chills.  Atherosclerotic risk factors include: HTN, HLD.  Past Medical History  Diagnosis Date  . Restrictive cardiomyopathy   . Atrial fibrillation 1996    S/P cardioversion  . CVA (cerebral infarction) 1999  . CPAP (continuous positive airway pressure) dependence   . Anemia     NOS  . Iron deficiency   . Gout   . Peptic ulcer, acute with hemorrhage 2005, 2011  . Hypertension   . Hypothyroidism   . Diastolic dysfunction   . Adenomatous colon polyp   . GERD (gastroesophageal reflux disease)   . Hiatal hernia   . Diverticulosis   . Anxiety disorder   . Arthritis   . Internal and external hemorrhoids without complication   . Morbid obesity   . CAD (coronary artery disease) 08/06/2008    One vessel by cath, EF overall preserved by echo 04/30/2008  . On home oxygen therapy   . Renal failure     Dr. Fox  . Hyperlipidemia   . CHF (congestive heart failure)   . Complication of anesthesia     pt states she was sore "all-over" for over a week after anesthesia  . COAD (chronic obstructive airways disease) 2007    Exacerbation  . COPD (chronic obstructive pulmonary disease)     oxygen prn  . Sleep apnea     cpap - no longer uses  . Stroke     x2 -- effected vision; left sided slightly weak  . Rash     on upper extremities    Past  Surgical History  Procedure Laterality Date  . Appendectomy    . Oophorectomy    . Incision and drainage of cellulitis  04/2008    Umbilical abcess  . Cardiac catheterization      Pulmonary HTN, non obstructive CAD  . Abdominal hysterectomy    . Mole removal      on leg    History   Social History  . Marital Status: Married    Spouse Name: N/A    Number of Children: N/A  . Years of Education: N/A   Occupational History  . Retired [employer]   Social History Main Topics  . Smoking status: Never Smoker   . Smokeless tobacco: Never Used  . Alcohol Use: No  . Drug Use: No  . Sexual Activity: Not on file   Other Topics Concern  . Not on file   Social History Narrative   Married   One child   No regular exercise   Oxygen dependent    Daily caffeine use: one daily      06/27/2010 - Designated party form signed appointing spouse Wadeford Herter or daughter Debbie Stokes; OK to leave message on home answering machine at [phone]; Debbie - [phone]    Family History  Problem   Relation Age of Onset  . Breast cancer Mother   . Cancer Mother     breast  . Heart disease Father     MI  . Heart attack Father   . Breast cancer Sister   . Cancer Sister     breast  . Peripheral vascular disease Sister   . Heart disease Brother     CABG 5 vessel  . Rheum arthritis Brother   . Colon cancer Neg Hx     Current Outpatient Prescriptions on File Prior to Visit  Medication Sig Dispense Refill  . acetaminophen (TYLENOL) 325 MG tablet Take 650 mg by mouth every 4 (four) hours as needed. For pain.      Marland Kitchen allopurinol (ZYLOPRIM) 100 MG tablet Take 100 mg by mouth every other day.      . calcitRIOL (ROCALTROL) 0.5 MCG capsule Take 0.5 mcg by mouth every other day.       Marland Kitchen epoetin alfa (PROCRIT) 10258 UNIT/ML injection Inject 40,000 Units into the skin once a week.       . fentaNYL (DURAGESIC) 25 MCG/HR patch Place 1 patch (25 mcg total) onto the skin every 3 (three) days.  5  patch  0  . hydrocortisone (ANUSOL-HC) 25 MG suppository Place 1 suppository (25 mg total) rectally at bedtime as needed. For hemorrhoids. Use for 10 days.  12 suppository  1  . levothyroxine (SYNTHROID, LEVOTHROID) 75 MCG tablet TAKE 1 TABLET BY MOUTH EVERY DAY  90 tablet  3  . losartan (COZAAR) 100 MG tablet Take 50 mg by mouth daily.      . metolazone (ZAROXOLYN) 2.5 MG tablet TAKE 1 TABLET BY MOUTH EVERY OTHER DAY AS NEEDED  30 tablet  5  . pantoprazole (PROTONIX) 40 MG tablet TAKE 1 TABLET BY MOUTH DAILY  60 tablet  0  . potassium chloride 40 MEQ/15ML (20%) LIQD TAKE 15ML BY MOUTH TWICE DAILY  900 mL  3  . torsemide (DEMADEX) 20 MG tablet TAKE 7 TABLETS BY MOUTH DAILY  210 tablet  0  . traMADol (ULTRAM) 50 MG tablet TAKE 1/2 TO 1 TABLET BY MOUTH EVERY 8 TO 12 HOURS AS NEEDED  60 tablet  1  . vitamin C (ASCORBIC ACID) 500 MG tablet Take 500 mg by mouth daily.         No current facility-administered medications on file prior to visit.    No Known Allergies  REVIEW OF SYSTEMS:  (Positives checked otherwise negative)  CARDIOVASCULAR:  []  chest pain, []  chest pressure, []  palpitations, [x]  shortness of breath when laying flat, []  shortness of breath with exertion,  [x]  pain in feet when walking, [x]  pain in feet when laying flat, []  history of blood clot in veins (DVT), []  history of phlebitis, [x]  swelling in legs, []  varicose veins  PULMONARY:  []  productive cough, []  asthma, []  wheezing  NEUROLOGIC:  [x]  weakness in arms or legs, []  numbness in arms or legs, []  difficulty speaking or slurred speech, []  temporary loss of vision in one eye, [x]  dizziness  HEMATOLOGIC:  [x]  bleeding problems, []  problems with blood clotting too easily  MUSCULOSKEL:  []  joint pain, []  joint swelling  GASTROINTEST:  []  vomiting blood, []  blood in stool, [x]  h/o PUD  GENITOURINARY:  []  burning with urination, []  blood in urine  PSYCHIATRIC:  []  history of major depression  INTEGUMENTARY:  []  rashes,  [x]  ulcers  CONSTITUTIONAL:  []  fever, []  chills  For VQI Use Only  PRE-ADM LIVING:  Home  AMB STATUS: Ambulatory with Assistance  CAD Sx: None  PRIOR CHF: Moderate  STRESS TEST: [x]  No, [ ]  Normal, [ ]  + ischemia, [ ]  + MI, [ ]  Both  Physical Examination  Filed Vitals:   12/25/13 1542  BP: 113/62  Pulse: 74  Resp: 16  Height: 4\' 10"  (1.473 m)  Weight: 111 lb (50.349 kg)   Body mass index is 23.21 kg/(m^2).  General: A&O x 3, WD ,emaciated, elderly  Head: [place]/AT, Temporalis wasting,   Ear/Nose/Throat: Hearing grossly intact, nares w/o erythema or drainage, oropharynx w/o Erythema/Exudate, Mallampati score: 3  Eyes: PERRLA, EOMI  Neck: Supple, no nuchal rigidity, no palpable LAD  Pulmonary: Sym exp, good air movt, CTAB, no rales, rhonchi, & wheezing  Cardiac: RRR, Nl S1, S2, no Murmurs, rubs or gallops  Vascular: Vessel Right Left  Radial Palpable Palpable  Brachial Palpable Palpable  Carotid Palpable, without bruit Palpable, without bruit  Aorta Not palpable N/A  Femoral Palpable Palpable  Popliteal Not palpable Not palpable  PT Not Palpable Palpable  DP Not Palpable Palpable   Gastrointestinal: soft, NTND, -G/R, - HSM, - masses, - CVAT B  Musculoskeletal: M/S BUE 3-4/5, BLE not tested due to pain, B LDS, B extensive spider vein, medial ankle VSU with serous drainage, edema 1+, erythema through R ant. shin  Neurologic: CN 2-12 intact , Pain and light touch intact in extremities , Motor exam as listed above  Psychiatric: Judgment intact, Mood & affect appropriate for pt's clinical situation  Dermatologic: See M/S exam for extremity exam, no rashes otherwise noted  Lymph : No Cervical, Axillary, or Inguinal lymphadenopathy   Non-Invasive Vascular Imaging  Outside ABI (Date: 12/11/13)  R: 0.14, DP: mono, PT: mono, TBI: 0  L: 1.12, DP: tri, PT: tri, TBI: 0.40  Outside Studies/Documentation 6 pages of outside documents were reviewed including:  outpatient clinic chart, outside BLE ABI.  Medical Decision Making  [name] is a 80 y.o. female who presents with: RLE critical limb ischemia, likely chronic venous insufficiency (C6 = venous stasis ulcers), chronic kidney disease stage IV    Given this patient's poor functional status and multiple active co-morbidities, she is not a surgical revascularization candidate.  Her only hope for limb salvage is a percutaneous approach.  I discussed with the patient the natural history of critical limb ischemia: 25% require amputation in one year, 50% are able to maintain their limbs in one year, and 25-30% die in one year due to comorbidities.  Given the limb threatening status of this patient, I recommend an aggressive work up including proceeding with an: Aortogram, Bilateral runoff and intervention. I discussed with the patient the nature of angiographic procedures, especially the limited patencies of any endovascular intervention. The patient is aware of that the risks of an angiographic procedure include but are not limited to: bleeding, infection, access site complications, embolization, rupture of treated vessel, dissection, possible need for emergent surgical intervention, and possible need for surgical procedures to treat the patient's pathology. The patient is aware of the risks and agrees to proceed.  The procedure is scheduled for: aortogram with CO2, Right leg runoff, and possible intervention.  I discussed in depth with the patient the nature of atherosclerosis, and emphasized the importance of maximal medical management including strict control of blood pressure, blood glucose, and lipid levels, antiplatelet agents, obtaining regular exercise, and cessation of smoking.  The patient is aware that without maximal medical management the underlying atherosclerotic disease process will  progress, limiting the benefit of any interventions. The patient is currently not on a statin:   Unknown reason. The patient is currently not on an anti-platelet: due to history of PUD.  Thank you for allowing [place] to participate in this patient's care.  Adele Barthel, MD [facility] Office: [phone] Pager: [phone]  12/25/2013, 5:10 PM

## 2014-01-11 NOTE — Preoperative (Signed)
Beta Blockers   Reason not to administer Beta Blockers:Not Applicable 

## 2014-01-11 NOTE — Progress Notes (Signed)
ANTIBIOTIC CONSULT NOTE - INITIAL  Pharmacy Consult for Antibiotic Renal Adjustment Indication: Post op prophylaxsis  Allergies  Allergen Reactions  . Aspirin Other (See Comments)    Tendency to bleed easily    Patient Measurements: Height: 4\' 10"  (147.3 cm) Weight: 111 lb 3.6 oz (50.45 kg) IBW/kg (Calculated) : 40.9  Vital Signs: Temp: 97.5 F (36.4 C) (02/02 1523) Temp src: Oral (02/02 1523) BP: 124/46 mmHg (02/02 1523) Pulse Rate: 90 (02/02 1523) Intake/Output from previous day:   Intake/Output from this shift: Total I/O In: 1335 [I.V.:1335] Out: 20 [Blood:20]  Labs:  Recent Labs  01/11/14 0737  WBC 6.4  HGB 10.9*  PLT 200  CREATININE 3.76*   Estimated Creatinine Clearance: 8.1 ml/min (by C-G formula based on Cr of 3.76). No results found for this basename: VANCOTROUGH, VANCOPEAK, VANCORANDOM, GENTTROUGH, GENTPEAK, GENTRANDOM, TOBRATROUGH, TOBRAPEAK, TOBRARND, AMIKACINPEAK, AMIKACINTROU, AMIKACIN,  in the last 72 hours   Microbiology: No results found for this or any previous visit (from the past 720 hour(s)).  Medical History: Past Medical History  Diagnosis Date  . Restrictive cardiomyopathy   . Atrial fibrillation 1996    S/P cardioversion  . CVA (cerebral infarction) 1999  . CPAP (continuous positive airway pressure) dependence   . Anemia     NOS  . Iron deficiency   . Gout   . Peptic ulcer, acute with hemorrhage 2005, 2011  . Hypertension   . Hypothyroidism   . Diastolic dysfunction   . Adenomatous colon polyp   . GERD (gastroesophageal reflux disease)   . Hiatal hernia   . Diverticulosis   . Anxiety disorder   . Arthritis   . Internal and external hemorrhoids without complication   . Morbid obesity   . CAD (coronary artery disease) 08/06/2008    One vessel by cath, EF overall preserved by echo 04/30/2008  . On home oxygen therapy   . Renal failure     Dr. Hassell Done  . Hyperlipidemia   . CHF (congestive heart failure)   . Complication of  anesthesia     pt states she was sore "all-over" for over a week after anesthesia  . COAD (chronic obstructive airways disease) 2007    Exacerbation  . COPD (chronic obstructive pulmonary disease)     oxygen prn  . Sleep apnea     cpap - no longer uses  . Stroke     x2 -- effected vision; left sided slightly weak  . Rash     on upper extremities  . Bowel trouble     having constipation and diarrhea  . Family history of anesthesia complication     daughter had same "sore all over" experience with anesthesia    Medications:  Prescriptions prior to admission  Medication Sig Dispense Refill  . acetaminophen (TYLENOL) 325 MG tablet Take 650 mg by mouth every 4 (four) hours as needed. For pain.      Marland Kitchen allopurinol (ZYLOPRIM) 100 MG tablet Take 100 mg by mouth every other day.      . calcitRIOL (ROCALTROL) 0.5 MCG capsule Take 0.5 mcg by mouth every other day.       Marland Kitchen epoetin alfa (PROCRIT) 08657 UNIT/ML injection Inject 40,000 Units into the skin once a week.       Marland Kitchen HYDROcodone-acetaminophen (NORCO/VICODIN) 5-325 MG per tablet Take 1 tablet by mouth every 6 (six) hours as needed.  15 tablet  0  . hydrocortisone (ANUSOL-HC) 25 MG suppository Place 1 suppository (25 mg total) rectally at  bedtime as needed. For hemorrhoids. Use for 10 days.  12 suppository  1  . levothyroxine (SYNTHROID, LEVOTHROID) 75 MCG tablet Take 75 mcg by mouth daily before breakfast.      . losartan (COZAAR) 100 MG tablet Take 50 mg by mouth daily.      . metolazone (ZAROXOLYN) 2.5 MG tablet Take 2.5 mg by mouth every other day.      . pantoprazole (PROTONIX) 40 MG tablet Take 40 mg by mouth daily.      . potassium chloride 40 MEQ/15ML (20%) LIQD TAKE 15ML BY MOUTH TWICE DAILY  900 mL  3  . torsemide (DEMADEX) 20 MG tablet Take 140 mg by mouth daily.      . traMADol (ULTRAM) 50 MG tablet Take 25-50 mg by mouth every 8 (eight) hours as needed (for pain).      . vitamin C (ASCORBIC ACID) 500 MG tablet Take 500 mg by  mouth daily.        . cephALEXin (KEFLEX) 500 MG capsule Take 1 capsule (500 mg total) by mouth daily.  10 capsule  0  . oxyCODONE (ROXICODONE) 5 MG immediate release tablet Take 1 tablet (5 mg total) by mouth every 4 (four) hours as needed for severe pain.  30 tablet  0   Assessment: 80 yo F admitted 01/11/2014  For vasucular surgery.  Pharmacy consulted to renally adjust post op antibiotics.    ID Renal adjustment required, CrCl, 8 ml/min, single dose provides 24h post op coverage.  DC future doses.  Pharmacy will sign off.   [name] [name] 01/11/2014,5:45 PM

## 2014-01-12 ENCOUNTER — Encounter (HOSPITAL_COMMUNITY): Payer: Self-pay | Admitting: Vascular Surgery

## 2014-01-12 DIAGNOSIS — I739 Peripheral vascular disease, unspecified: Secondary | ICD-10-CM

## 2014-01-12 DIAGNOSIS — L98499 Non-pressure chronic ulcer of skin of other sites with unspecified severity: Secondary | ICD-10-CM

## 2014-01-12 DIAGNOSIS — S78119A Complete traumatic amputation at level between unspecified hip and knee, initial encounter: Secondary | ICD-10-CM

## 2014-01-12 LAB — CBC
HCT: 32.3 % — ABNORMAL LOW (ref 36.0–46.0)
Hemoglobin: 10.1 g/dL — ABNORMAL LOW (ref 12.0–15.0)
MCH: 32.3 pg (ref 26.0–34.0)
MCHC: 31.3 g/dL (ref 30.0–36.0)
MCV: 103.2 fL — AB (ref 78.0–100.0)
PLATELETS: 183 10*3/uL (ref 150–400)
RBC: 3.13 MIL/uL — ABNORMAL LOW (ref 3.87–5.11)
RDW: 17.7 % — AB (ref 11.5–15.5)
WBC: 8.7 10*3/uL (ref 4.0–10.5)

## 2014-01-12 LAB — BASIC METABOLIC PANEL
BUN: 115 mg/dL — ABNORMAL HIGH (ref 6–23)
CO2: 22 meq/L (ref 19–32)
CREATININE: 3.33 mg/dL — AB (ref 0.50–1.10)
Calcium: 7.9 mg/dL — ABNORMAL LOW (ref 8.4–10.5)
Chloride: 98 mEq/L (ref 96–112)
GFR calc non Af Amer: 12 mL/min — ABNORMAL LOW (ref >90)
GFR, EST AFRICAN AMERICAN: 14 mL/min — AB (ref >90)
Glucose, Bld: 72 mg/dL (ref 70–99)
Potassium: 3.6 mEq/L — ABNORMAL LOW (ref 3.7–5.3)
Sodium: 141 mEq/L (ref 137–147)

## 2014-01-12 MED ORDER — WHITE PETROLATUM GEL: Status: DC | PRN

## 2014-01-12 MED ORDER — WHITE PETROLATUM GEL
Status: DC | PRN
  Filled 2014-01-12: qty 5

## 2014-01-12 MED ORDER — BOOST / RESOURCE BREEZE PO LIQD
1.0000 | Freq: Three times a day (TID) | ORAL | Status: DC
  Administered 2014-01-12 – 2014-01-14 (×4): 1 via ORAL

## 2014-01-12 NOTE — Evaluation (Signed)
Physical Therapy Evaluation Patient Details Name: [name] MRN: [id number] DOB: 03/17/1929 Today's Date: 01/12/2014 Time: 0826-0902 PT Time Calculation (min): 36 min  PT Assessment / Plan / Recommendation History of Present Illness  Adm for Rt AKA  Clinical Impression  Patient is s/p Rt AKA surgery resulting in functional limitations due to the deficits listed below (see PT Problem List). Pt very motivated, however limited by pain (surgical and arthritis). Patient will benefit from skilled PT to increase their independence and safety with mobility to allow discharge to the venue listed below.       PT Assessment  Patient needs continued PT services    Follow Up Recommendations  CIR;Supervision/Assistance - 24 hour    Does the patient have the potential to tolerate intense rehabilitation      Barriers to Discharge Decreased caregiver support husband available 24/7 (?level of assist can provide) and daughter prn    Equipment Recommendations  None recommended by PT    Recommendations for Other Services Rehab consult   Frequency Min 3X/week    Precautions / Restrictions Precautions Precautions: Fall   Pertinent Vitals/Pain 3/10 Rt LE at rest; 6/10 after activity; patient repositioned for comfort SaO2 98% on 2L (uses O2 at home "sometimes")      Mobility  Bed Mobility Overal bed mobility: Needs Assistance Bed Mobility: Rolling;Sidelying to Sit;Sit to Sidelying Rolling: Min assist Sidelying to sit: Max assist;HOB elevated Sit to sidelying: Max assist General bed mobility comments: limited by pain and weakness Transfers Overall transfer level: Needs assistance Transfers: Lateral/Scoot Transfers  Lateral/Scoot Transfers: Total assist General transfer comment: along EOB to her Rt; limited use of LLE due to height of bed and difficulty reaching the floor    Exercises Amputee Exercises Hip ABduction/ADduction: AROM;Right;5 reps;Supine Hip Flexion/Marching:  AROM;Right;5 reps;Supine   PT Diagnosis: Generalized weakness;Acute pain  PT Problem List: Decreased strength;Decreased range of motion;Decreased activity tolerance;Decreased balance;Decreased mobility;Decreased knowledge of use of DME;Decreased knowledge of precautions;Pain;Decreased skin integrity PT Treatment Interventions: DME instruction;Functional mobility training;Therapeutic activities;Therapeutic exercise;Balance training;Patient/family education;Wheelchair mobility training     PT Goals(Current goals can be found in the care plan section) Acute Rehab PT Goals Patient Stated Goal: get stronger PT Goal Formulation: With patient Time For Goal Achievement: 01/19/14 Potential to Achieve Goals: Good  Visit Information  Last PT Received On: 01/12/14 Assistance Needed: +1 History of Present Illness: Adm for Rt AKA       Prior [place] expects to be discharged to:: Private residence Living Arrangements: Spouse/significant other Available Help at Discharge: Family;Available 24 hours/day Type of Home: House Home Access: Ramped entrance (have started building ramp) Home Layout: Two level;Able to live on main level with bedroom/bathroom Alternate Level Stairs-Number of Steps: 2 Home Equipment: Walker - 4 wheels;Cane - single point;Walker - 2 wheels;Wheelchair - [employer];Other (comment) (home O2) Prior Function Level of Independence: Needs assistance Gait / Transfers Assistance Needed: using cane primarily; assist on stairs into home ADL's / Homemaking Assistance Needed: sponge bath with assist Comments: fell 3 weeks ago--slipped off the bed Communication Communication: HOH    Cognition  Cognition Arousal/Alertness: Awake/alert Behavior During Therapy: WFL for tasks assessed/performed Overall Cognitive Status: Within Functional Limits for tasks assessed    Extremity/Trunk Assessment Upper Extremity Assessment Upper Extremity  Assessment: Generalized weakness;RUE deficits/detail;LUE deficits/detail RUE Deficits / Details: arthritic limited shoulders LUE Deficits / Details: arthritic limited shoulders Lower Extremity Assessment Lower Extremity Assessment: RLE deficits/detail;LLE deficits/detail RLE Deficits / Details: new AKA; hip flex 2+,  abdct 2+ LLE Deficits / Details: AROM hip WFL, Knee to 100 flexion, ankle WFL; strength 3+ Cervical / Trunk Assessment Cervical / Trunk Assessment: Kyphotic   Balance Balance Overall balance assessment: Needs assistance Sitting-balance support: Bilateral upper extremity supported;Feet unsupported Sitting balance-Leahy Scale: Poor Sitting balance - Comments: posterior lean; able to maintain sitting with bil UE support with minguard assist for up to 30 sec Postural control: Posterior lean General Comments General comments (skin integrity, edema, etc.): pt reports she has a sore on her bottom that "hurts more than my leg" due to prolonged sitting on her couch. Agrees to continued rehab  End of Session PT - End of Session Equipment Utilized During Treatment: Oxygen Activity Tolerance: Patient tolerated treatment well Patient left: in bed;with call bell/phone within reach  GP     Coren Crownover 01/12/2014, 9:21 AM Pager [phone]

## 2014-01-12 NOTE — Evaluation (Signed)
Occupational Therapy Evaluation Patient Details Name: [name] MRN: [id number] DOB: December 15, 1928 Today's Date: 01/12/2014 Time: 9629-5284 OT Time Calculation (min): 29 min  OT Assessment / Plan / Recommendation History of present illness Pt admitted with R AKA   Clinical Impression   Pt admitted with the above diagnosis and has the limitations listed below.  Pt would benefit from cont OT to increase I with basic adls so she can eventually return home.  Pt has had a lot of assist from family with adls PTA and is very motivated to be as I as she can.  Rehab may be able to get this pt and her husband, through training, to the level she could go home at w/c level with her husband.      OT Assessment  Patient needs continued OT Services    Follow Up Recommendations  CIR;Supervision/Assistance - 24 hour    Barriers to Discharge   unsure how [name] physical help husband can provide but he has been helping up until now w no problems.  Equipment Recommendations  Tub/shower bench    Recommendations for Other Services Rehab consult  Frequency  Min 2X/week    Precautions / Restrictions Precautions Precautions: Fall Restrictions Weight Bearing Restrictions: Yes RLE Weight Bearing:  (R AKA )   Pertinent Vitals/Pain Pt with pain at 6/10 in R leg.  Pt on 2L of O2.  Pt states she uses O2 at home some but not always.    ADL  Eating/Feeding: Performed;Set up Where Assessed - Eating/Feeding: Bed level Grooming: Performed;Wash/dry hands;Wash/dry face;Teeth care;Set up Where Assessed - Grooming: Supported sitting Upper Body Bathing: Simulated;Set up Where Assessed - Upper Body Bathing: Supported sitting Lower Body Bathing: Simulated;Moderate assistance Where Assessed - Lower Body Bathing: Supine, head of bed up Upper Body Dressing: Simulated;Minimal assistance Where Assessed - Upper Body Dressing: Supported sitting Lower Body Dressing: Simulated;Maximal assistance Where Assessed - Lower  Body Dressing: Supine, head of bed up Toilet Transfer: Other (comment) (unable to assess) Transfers/Ambulation Related to ADLs: Pt unable to stand or ambulate at this time. ADL Comments: Pt does well with adls at bed level and at side of bed.  Pt very motivated and willing to try anything despite her pain.     OT Diagnosis: Generalized weakness;Acute pain  OT Problem List: Decreased strength;Decreased range of motion;Decreased activity tolerance;Impaired balance (sitting and/or standing);Decreased knowledge of use of DME or AE;Pain;Impaired UE functional use OT Treatment Interventions: Self-care/ADL training;DME and/or AE instruction;Therapeutic activities;Balance training   OT Goals(Current goals can be found in the care plan section) Acute Rehab OT Goals Patient Stated Goal: get stronger OT Goal Formulation: With patient/family Time For Goal Achievement: 01/26/14 Potential to Achieve Goals: Fair ADL Goals Pt Will Perform Grooming: with min assist;sitting Pt Will Perform Lower Body Bathing: with min assist;bed level Pt Will Perform Lower Body Dressing: bed level;with min assist;sitting/lateral leans Pt Will Transfer to Toilet: with mod assist;bedside commode Pt Will Perform Tub/Shower Transfer: tub bench;Tub transfer;with transfer board Additional ADL Goal #1: Pt will sit on side of bed with S to do adls in sitting with S for balance.  Visit Information  Last OT Received On: 01/12/14 Assistance Needed: +1 History of Present Illness: Pt admitted with R AKA       Prior [place] expects to be discharged to:: Private residence Living Arrangements: Spouse/significant other Available Help at Discharge: Family;Available 24 hours/day Type of Home: House Home Access: Ramped entrance Home Layout: Two level;Able  to live on main level with bedroom/bathroom Alternate Level Stairs-Number of Steps: 2 Home Equipment: Walker - 4 wheels;Cane - single  point;Bedside commode;Wheelchair - manual Prior Function Level of Independence: Needs assistance Gait / Transfers Assistance Needed: using cane primarily; assist on stairs into home ADL's / Homemaking Assistance Needed: sponge bathe w assist, assist to donn bra and occasionally pants, assist to toilet. Comments: fell 3 weeks ago--slipped off the bed Communication Communication: HOH Dominant Hand: Right         Vision/Perception Vision - History Baseline Vision: Wears glasses only for reading Patient Visual Report: No change from baseline Vision - Assessment Eye Alignment: Within Functional Limits Vision Assessment: Vision not tested   Cognition  Cognition Arousal/Alertness: Awake/alert Behavior During Therapy: WFL for tasks assessed/performed Overall Cognitive Status: Within Functional Limits for tasks assessed    Extremity/Trunk Assessment Upper Extremity Assessment Upper Extremity Assessment: RUE deficits/detail;LUE deficits/detail;Generalized weakness RUE Deficits / Details: arthritic limited shoulders RUE: Unable to fully assess due to pain LUE Deficits / Details: arthritic limited shoulders LUE: Unable to fully assess due to pain Lower Extremity Assessment Lower Extremity Assessment: Defer to PT evaluation RLE Deficits / Details: new AKA; hip flex 2+, abdct 2+ LLE Deficits / Details: AROM hip WFL, Knee to 100 flexion, ankle WFL; strength 3+ Cervical / Trunk Assessment Cervical / Trunk Assessment: Kyphotic     Mobility Bed Mobility Overal bed mobility: Needs Assistance Bed Mobility: Rolling;Sidelying to Sit;Sit to Sidelying Rolling: Min assist Sidelying to sit: Max assist;HOB elevated Sit to sidelying: Max assist General bed mobility comments: limited by pain and weakness Transfers Overall transfer level: Needs assistance Equipment used: None Transfers: Lateral/Scoot Transfers  Lateral/Scoot Transfers: Total assist General transfer comment: along EOB to her  Rt; limited use of LLE due to height of bed and difficulty reaching the floor     Exercise Amputee Exercises Hip ABduction/ADduction: AROM;Right;5 reps;Supine Hip Flexion/Marching: AROM;Right;5 reps;Supine   Balance Balance Overall balance assessment: Needs assistance Sitting-balance support: Bilateral upper extremity supported Sitting balance-Leahy Scale: Poor Sitting balance - Comments: posterior lean; able to maintain sitting with bil UE support with minguard assist for up to 30 sec Postural control: Posterior lean General Comments General comments (skin integrity, edema, etc.): Pt with possible pressure area on bottom from prolonged sitting at home.   End of Session OT - End of Session Equipment Utilized During Treatment: Oxygen Activity Tolerance: Patient limited by pain Patient left: in bed;with call bell/phone within reach;with family/visitor present Nurse Communication: Mobility status  [place], [place] 01/12/2014, 11:19 AM [phone]

## 2014-01-12 NOTE — Progress Notes (Signed)
Clinical Social Work Department BRIEF PSYCHOSOCIAL ASSESSMENT 01/12/2014  Patient:  [name], [name]     Account Number:  [id number]     Admit date:  01/11/2014  Clinical Social Worker:  Megan Salon  Date/Time:  01/12/2014 03:39 PM  Referred by:  RN  Date Referred:  01/12/2014 Referred for  SNF Placement   Other Referral:   Interview type:  Other - See comment Other interview type:   CSW spoke with patient, patient's husband and daughter by bedside    PSYCHOSOCIAL DATA Living Status:  HUSBAND Admitted from facility:   Level of care:   Primary support name:  [name] Primary support relationship to patient:  CHILD, ADULT Degree of support available:   Good    CURRENT CONCERNS Current Concerns  Post-Acute Placement   Other Concerns:    SOCIAL WORK ASSESSMENT / PLAN Clinical Social Worker received referral for backup SNF placement vs. CIR at d/c. CSW introduced self and explained reason for visit. Patient had visitors by bedside, patient's  husband and patient's daughter. CSW explained SNF process and provided SNF packet to patient and family. Patient gave permission for CSW to speak with daughter about dc plans. Daughter expressed that she really  wants CIR and would prefer that over SNF. CSW understood and explained that SNF is a backup plan.  Patient's daughter reported she is agreeable for SNF placement if CIR is not abe to take patient. Patient and patient's daughter's preference is [facility]. CSW will complete FL2 for MD's signature and will update patient and family when bed offers are received.   Assessment/plan status:  Psychosocial Support/Ongoing Assessment of Needs Other assessment/ plan:   CSW information/ SNF packet   Information/referral to community resources:   Patient and patient's daughter very pleasant towards [employer], [facility]. Patient states she is eager to get better and stronger. Daughter expressed interest in [place] and states she would love for  her mom to go there.    PATIENT'S/FAMILY'S RESPONSE TO PLAN OF CARE:      [name], MSW, [place]

## 2014-01-12 NOTE — Progress Notes (Signed)
Clinical Social Work Department CLINICAL SOCIAL WORK PLACEMENT NOTE 01/12/2014  Patient:  [name], [name]  Account Number:  [id number] Admit date:  01/11/2014  Clinical Social Worker:  Megan Salon  Date/time:  01/12/2014 03:53 PM  Clinical Social Work is seeking post-discharge placement for this patient at the following level of care:   [place]   (*CSW will update this form in Epic as items are completed)   01/12/2014  Patient/family provided with [facility] Department of Clinical Social Work's list of facilities offering this level of care within the geographic area requested by the patient (or if unable, by the patient's family).  01/12/2014  Patient/family informed of their freedom to choose among providers that offer the needed level of care, that participate in Medicare, Medicaid or managed care program needed by the patient, have an available bed and are willing to accept the patient.  01/12/2014  Patient/family informed of [facility]' ownership interest in [facility], as well as of the fact that they are under no obligation to receive care at this facility.  PASARR submitted to EDS on 01/12/2014 PASARR number received from EDS on 01/12/2014  FL2 transmitted to all facilities in geographic area requested by pt/family on  01/12/2014 FL2 transmitted to all facilities within larger geographic area on   Patient informed that his/her managed care company has contracts with or will negotiate with  certain facilities, including the following:     Patient/family informed of bed offers received:   Patient chooses bed at  Physician recommends and patient chooses bed at    Patient to be transferred to  on   Patient to be transferred to facility by   The following physician request were entered in Epic:   Additional Comments:  Jeanette Caprice, MSW, [place]

## 2014-01-12 NOTE — Progress Notes (Signed)
Utilization review completed.  

## 2014-01-12 NOTE — Consult Note (Signed)
Physical Medicine and Rehabilitation Consult Reason for Consult: Right AKA Referring Physician: Dr. Imogene Burn   HPI: [name] is a 80 y.o. right-handed female with history of atrial fibrillation status post cardioversion, restrictive cardiomyopathy, CVA 1999 with mild left-sided weakness and chronic renal insufficiency with baseline creatinine 3.48-3.76. Admitted on 01/11/2014 with right lower extremity pain as well as right medial leg ulcers with serous drainage. Patient underwent recent angiographic procedures for evaluation of right lower extremity pain showing severe proximal stenosis and poor runoff. Limb was not felt to be salvageable. Underwent right above-knee amputation 01/11/2014 per Dr. Imogene Burn. Postoperative pain management. Subcutaneous Lovenox added for DVT prophylaxis. Physical and occupational therapy evaluations are pending. M.D. as requested physical medicine rehabilitation consult to consider inpatient rehabilitation services.   Review of Systems  Respiratory: Positive for shortness of breath.   Cardiovascular: Positive for palpitations and leg swelling.  Gastrointestinal: Positive for constipation.       GERD  Musculoskeletal: Positive for joint pain.  Psychiatric/Behavioral:       Anxiety  All other systems reviewed and are negative.   Past Medical History  Diagnosis Date  . Restrictive cardiomyopathy   . Atrial fibrillation 1996    S/P cardioversion  . CVA (cerebral infarction) 1999  . CPAP (continuous positive airway pressure) dependence   . Anemia     NOS  . Iron deficiency   . Gout   . Peptic ulcer, acute with hemorrhage 2005, 2011  . Hypertension   . Hypothyroidism   . Diastolic dysfunction   . Adenomatous colon polyp   . GERD (gastroesophageal reflux disease)   . Hiatal hernia   . Diverticulosis   . Anxiety disorder   . Arthritis   . Internal and external hemorrhoids without complication   . Morbid obesity   . CAD (coronary artery  disease) 08/06/2008    One vessel by cath, EF overall preserved by echo 04/30/2008  . On home oxygen therapy   . Renal failure     Dr. Caryn Section  . Hyperlipidemia   . CHF (congestive heart failure)   . Complication of anesthesia     pt states she was sore "all-over" for over a week after anesthesia  . COAD (chronic obstructive airways disease) 2007    Exacerbation  . COPD (chronic obstructive pulmonary disease)     oxygen prn  . Sleep apnea     cpap - no longer uses  . Stroke     x2 -- effected vision; left sided slightly weak  . Rash     on upper extremities  . Bowel trouble     having constipation and diarrhea  . Family history of anesthesia complication     daughter had same "sore all over" experience with anesthesia   Past Surgical History  Procedure Laterality Date  . Appendectomy    . Oophorectomy    . Incision and drainage of cellulitis  04/2008    Umbilical abcess  . Cardiac catheterization      Pulmonary HTN, non obstructive CAD  . Mole removal      on leg  . Vaginal hysterectomy    . Colonoscopy    . Bascilic vein transposition Left     Left upper arm   Family History  Problem Relation Age of Onset  . Breast cancer Mother   . Cancer Mother     breast  . Heart disease Father     MI  . Heart attack Father   .  Breast cancer Sister   . Cancer Sister     breast  . Peripheral vascular disease Sister   . Heart disease Brother     CABG 5 vessel  . Rheum arthritis Brother   . Colon cancer Neg Hx    Social History:  reports that she has never smoked. She has never used smokeless tobacco. She reports that she does not drink alcohol or use illicit drugs. Allergies:  Allergies  Allergen Reactions  . Aspirin Other (See Comments)    Tendency to bleed easily   Medications Prior to Admission  Medication Sig Dispense Refill  . acetaminophen (TYLENOL) 325 MG tablet Take 650 mg by mouth every 4 (four) hours as needed. For pain.      Marland Kitchen allopurinol (ZYLOPRIM) 100 MG  tablet Take 100 mg by mouth every other day.      . calcitRIOL (ROCALTROL) 0.5 MCG capsule Take 0.5 mcg by mouth every other day.       Marland Kitchen epoetin alfa (PROCRIT) 95188 UNIT/ML injection Inject 40,000 Units into the skin once a week.       Marland Kitchen HYDROcodone-acetaminophen (NORCO/VICODIN) 5-325 MG per tablet Take 1 tablet by mouth every 6 (six) hours as needed.  15 tablet  0  . hydrocortisone (ANUSOL-HC) 25 MG suppository Place 1 suppository (25 mg total) rectally at bedtime as needed. For hemorrhoids. Use for 10 days.  12 suppository  1  . levothyroxine (SYNTHROID, LEVOTHROID) 75 MCG tablet Take 75 mcg by mouth daily before breakfast.      . losartan (COZAAR) 100 MG tablet Take 50 mg by mouth daily.      . metolazone (ZAROXOLYN) 2.5 MG tablet Take 2.5 mg by mouth every other day.      . pantoprazole (PROTONIX) 40 MG tablet Take 40 mg by mouth daily.      . potassium chloride 40 MEQ/15ML (20%) LIQD TAKE 15ML BY MOUTH TWICE DAILY  900 mL  3  . torsemide (DEMADEX) 20 MG tablet Take 140 mg by mouth daily.      . traMADol (ULTRAM) 50 MG tablet Take 25-50 mg by mouth every 8 (eight) hours as needed (for pain).      . vitamin C (ASCORBIC ACID) 500 MG tablet Take 500 mg by mouth daily.        . cephALEXin (KEFLEX) 500 MG capsule Take 1 capsule (500 mg total) by mouth daily.  10 capsule  0  . oxyCODONE (ROXICODONE) 5 MG immediate release tablet Take 1 tablet (5 mg total) by mouth every 4 (four) hours as needed for severe pain.  30 tablet  0    Home: Home Living Family/patient expects to be discharged to:: Private residence Living Arrangements: Spouse/significant other  Functional History:   Functional Status:  Mobility:          ADL:    Cognition: Cognition Orientation Level: Oriented X4    Blood pressure 123/48, pulse 75, temperature 96.8 F (36 C), temperature source Axillary, resp. rate 18, height 4\' 10"  (1.473 m), weight 49.3 kg (108 lb 11 oz), SpO2 98.00%. Physical Exam  Vitals  reviewed. Constitutional: She is oriented to person, place, and time.  Frail 80 year old female  HENT:  Head: Normocephalic.  Eyes: EOM are normal.  Neck: Normal range of motion. Neck supple. No thyromegaly present.  Cardiovascular: Normal rate and regular rhythm.   Respiratory: Effort normal and breath sounds normal. No respiratory distress.  GI: Soft. Bowel sounds are normal. She exhibits no distension.  Musculoskeletal:  Left foot was painful with manipulation of the sock and palpation over the ankle.  Neurological: She is alert and oriented to person, place, and time.  Follow simple commands. UE's 5/5. LLE is 3+ HF, KE, 4- at ankle with decreases PP and LT leg is hypersensitive to touch.   Skin:  High right AKA dressed and appropriately tender. Chronic vascular changes to the left leg.  Psychiatric: She has a normal mood and affect. Her behavior is normal.    Results for orders placed during the hospital encounter of 01/11/14 (from the past 24 hour(s))  CBC     Status: Abnormal   Collection Time    01/11/14  7:37 AM      Result Value Range   WBC 6.4  4.0 - 10.5 K/uL   RBC 3.40 (*) 3.87 - 5.11 MIL/uL   Hemoglobin 10.9 (*) 12.0 - 15.0 g/dL   HCT 34.8 (*) 36.0 - 46.0 %   MCV 102.4 (*) 78.0 - 100.0 fL   MCH 32.1  26.0 - 34.0 pg   MCHC 31.3  30.0 - 36.0 g/dL   RDW 17.6 (*) 11.5 - 15.5 %   Platelets 200  150 - 400 K/uL  BASIC METABOLIC PANEL     Status: Abnormal   Collection Time    01/11/14  7:37 AM      Result Value Range   Sodium 142  137 - 147 mEq/L   Potassium 2.8 (*) 3.7 - 5.3 mEq/L   Chloride 94 (*) 96 - 112 mEq/L   CO2 26  19 - 32 mEq/L   Glucose, Bld 82  70 - 99 mg/dL   BUN 125 (*) 6 - 23 mg/dL   Creatinine, Ser 3.76 (*) 0.50 - 1.10 mg/dL   Calcium 8.3 (*) 8.4 - 10.5 mg/dL   GFR calc non Af Amer 10 (*) >90 mL/min   GFR calc Af Amer 12 (*) >90 mL/min  CBC     Status: Abnormal   Collection Time    01/11/14  4:47 PM      Result Value Range   WBC 7.7  4.0 - 10.5  K/uL   RBC 3.16 (*) 3.87 - 5.11 MIL/uL   Hemoglobin 10.1 (*) 12.0 - 15.0 g/dL   HCT 32.2 (*) 36.0 - 46.0 %   MCV 101.9 (*) 78.0 - 100.0 fL   MCH 32.0  26.0 - 34.0 pg   MCHC 31.4  30.0 - 36.0 g/dL   RDW 17.4 (*) 11.5 - 15.5 %   Platelets 199  150 - 400 K/uL  CREATININE, SERUM     Status: Abnormal   Collection Time    01/11/14  4:47 PM      Result Value Range   Creatinine, Ser 3.48 (*) 0.50 - 1.10 mg/dL   GFR calc non Af Amer 11 (*) >90 mL/min   GFR calc Af Amer 13 (*) >90 mL/min  BASIC METABOLIC PANEL     Status: Abnormal   Collection Time    01/11/14  7:08 PM      Result Value Range   Sodium 140  137 - 147 mEq/L   Potassium 3.4 (*) 3.7 - 5.3 mEq/L   Chloride 97  96 - 112 mEq/L   CO2 21  19 - 32 mEq/L   Glucose, Bld 102 (*) 70 - 99 mg/dL   BUN 114 (*) 6 - 23 mg/dL   Creatinine, Ser 3.39 (*) 0.50 - 1.10 mg/dL   Calcium 7.7 (*) 8.4 -  10.5 mg/dL   GFR calc non Af Amer 12 (*) >90 mL/min   GFR calc Af Amer 14 (*) >90 mL/min  MRSA PCR SCREENING     Status: None   Collection Time    01/11/14  7:52 PM      Result Value Range   MRSA by PCR NEGATIVE  NEGATIVE  BASIC METABOLIC PANEL     Status: Abnormal   Collection Time    01/12/14  4:07 AM      Result Value Range   Sodium 141  137 - 147 mEq/L   Potassium 3.6 (*) 3.7 - 5.3 mEq/L   Chloride 98  96 - 112 mEq/L   CO2 22  19 - 32 mEq/L   Glucose, Bld 72  70 - 99 mg/dL   BUN 115 (*) 6 - 23 mg/dL   Creatinine, Ser 3.33 (*) 0.50 - 1.10 mg/dL   Calcium 7.9 (*) 8.4 - 10.5 mg/dL   GFR calc non Af Amer 12 (*) >90 mL/min   GFR calc Af Amer 14 (*) >90 mL/min  CBC     Status: Abnormal   Collection Time    01/12/14  4:07 AM      Result Value Range   WBC 8.7  4.0 - 10.5 K/uL   RBC 3.13 (*) 3.87 - 5.11 MIL/uL   Hemoglobin 10.1 (*) 12.0 - 15.0 g/dL   HCT 32.3 (*) 36.0 - 46.0 %   MCV 103.2 (*) 78.0 - 100.0 fL   MCH 32.3  26.0 - 34.0 pg   MCHC 31.3  30.0 - 36.0 g/dL   RDW 17.7 (*) 11.5 - 15.5 %   Platelets 183  150 - 400 K/uL   Dg  Chest 2 View  01/11/2014   CLINICAL DATA:  Cardiomyopathy, COPD  EXAM: CHEST  2 VIEW  COMPARISON:  07/03/2012  FINDINGS: Cardiac shadow remains enlarged. The lungs are well aerated bilaterally without focal infiltrate. A tiny right-sided pleural effusion is seen. No acute bony abnormality is noted.  IMPRESSION: Tiny right-sided pleural effusion.  Stable cardiomegaly.   Electronically Signed   By: Inez Catalina M.D.   On: 01/11/2014 08:55    Assessment/Plan: Diagnosis: Right AKA 1. Does the need for close, 24 hr/day medical supervision in concert with the patient's rehab needs make it unreasonable for this patient to be served in a less intensive setting? Yes 2. Co-Morbidities requiring supervision/potential complications: HTN, afib, chf, gout, pad 3. Due to bladder management, bowel management, safety, skin/wound care, disease management, medication administration, pain management and patient education, does the patient require 24 hr/day rehab nursing? Yes 4. Does the patient require coordinated care of a physician, rehab nurse, PT (1-2 hrs/day, 5 days/week) and OT (1-2 hrs/day, 5 days/week) to address physical and functional deficits in the context of the above medical diagnosis(es)? Yes Addressing deficits in the following areas: balance, endurance, locomotion, strength, transferring, bowel/bladder control, bathing, dressing, feeding, grooming, toileting and psychosocial support 5. Can the patient actively participate in an intensive therapy program of at least 3 hrs of therapy per day at least 5 days per week? Yes 6. The potential for patient to make measurable gains while on inpatient rehab is good 7. Anticipated functional outcomes upon discharge from inpatient rehab are supervision to min assist with PT, supervision to min assist with OT, n/a with SLP. 8. Estimated rehab length of stay to reach the above functional goals is: 12-15 days 9. Does the patient have adequate social supports to  accommodate  these discharge functional goals? Yes 10. Anticipated D/C setting: Home 11. Anticipated post D/C treatments: [facility] therapy 12. Overall Rehab/Functional Prognosis: excellent  RECOMMENDATIONS: This patient's condition is appropriate for continued rehabilitative care in the following setting: CIR Patient has agreed to participate in recommended program. Yes Note that insurance prior authorization may be required for reimbursement for recommended care.  Comment: Rehab Admissions Coordinator to follow up.  Thanks,  Meredith Staggers, MD, Mell[facility] Drown     01/12/2014

## 2014-01-12 NOTE — Progress Notes (Signed)
INITIAL NUTRITION ASSESSMENT  DOCUMENTATION CODES Per approved criteria  -Severe malnutrition in the context of chronic illness   INTERVENTION: Resource Breeze po TID, each supplement provides 250 kcal and 9 grams of protein  NUTRITION DIAGNOSIS: Malnutrition related to chronic disease as evidenced by severe fat and muscle wasting.   Goal: Pt to meet >/= 90% of their estimated nutrition needs   Monitor:  Diet advancement, PO intake, weight trend, labs  Reason for Assessment: Pt identified as at nutrition risk on the Malnutrition Screen Tool  80 y.o. female  Admitting Dx: <principal problem not specified>  ASSESSMENT: Pt admitted for elective right AKA for gangrene/ischemic limb.  Weight hx difficult to determine as per pt she has been as heavy as 180 lb but this was due to fluid. Pt reports decreased appetite for the last 3 weeks due to pain in her right lower extremity. Pt does not like ensure but is willing to try Breeze. Potassium is low. Per pt she had an HD catheter placed with anticipation of starting HD but kidney function has stayed stable so that has not happened yet.  Pt tolerated clear liquids this am without nausea. Pt plans to d/c to rehab/SNF.   Nutrition Focused Physical Exam:  Subcutaneous Fat:  Orbital Region: WNL Upper Arm Region: WNL Thoracic and Lumbar Region: severe wasting  Muscle:  Temple Region: WNL Clavicle Bone Region: severe wasting Clavicle and Acromion Bone Region: severe wasting Scapular Bone Region: severe wasting Dorsal Hand: severe wasting Patellar Region: WNL Anterior Thigh Region: WNL Posterior Calf Region: WNL  Edema: not present  Height: Ht Readings from Last 1 Encounters:  01/11/14 4\' 10"  (1.473 m)    Weight: Wt Readings from Last 1 Encounters:  01/12/14 108 lb 11 oz (49.3 kg)    Ideal Body Weight: 40.3 kg   % Ideal Body Weight: 122%  Wt Readings from Last 10 Encounters:  01/12/14 108 lb 11 oz (49.3 kg)  01/12/14  108 lb 11 oz (49.3 kg)  12/31/13 110 lb (49.896 kg)  12/31/13 110 lb (49.896 kg)  12/25/13 111 lb (50.349 kg)  12/09/13 111 lb (50.349 kg)  10/01/13 117 lb 12.8 oz (53.434 kg)  03/13/13 139 lb (63.05 kg)  02/09/13 140 lb (63.504 kg)  08/22/12 141 lb (63.957 kg)    Usual Body Weight: unknown  % Usual Body Weight: -  BMI:  23.7 - adjusted for amputation   Estimated Nutritional Needs: Kcal: 1300-1500 Protein: 50-60 grams Fluid: > 1.5 L/day  Skin:  Stage 2 pressure ulcer on coccyx Incision right thigh Elbow abrasion  Diet Order: Clear Liquid  EDUCATION NEEDS: -No education needs identified at this time   Intake/Output Summary (Last 24 hours) at 01/12/14 0904 Last data filed at 01/12/14 0600  Gross per 24 hour  Intake   2170 ml  Output    420 ml  Net   1750 ml    Last BM: 2/1   Labs:   Recent Labs Lab 01/11/14 0737 01/11/14 1647 01/11/14 1908 01/12/14 0407  NA 142  --  140 141  K 2.8*  --  3.4* 3.6*  CL 94*  --  97 98  CO2 26  --  21 22  BUN 125*  --  114* 115*  CREATININE 3.76* 3.48* 3.39* 3.33*  CALCIUM 8.3*  --  7.7* 7.9*  GLUCOSE 82  --  102* 72    CBG (last 3)  No results found for this basename: GLUCAP,  in the last 72 hours  Scheduled Meds: . [START ON 01/13/2014] allopurinol  100 mg Oral QODAY  . calcitRIOL  0.5 mcg Oral QODAY  . docusate sodium  100 mg Oral Daily  . enoxaparin (LOVENOX) injection  30 mg Subcutaneous Q24H  . levothyroxine  75 mcg Oral QAC breakfast  . losartan  50 mg Oral Daily  . metolazone  2.5 mg Oral QODAY  . pantoprazole  40 mg Oral Daily  . torsemide  140 mg Oral Daily  . vitamin C  500 mg Oral Daily    Continuous Infusions: . sodium chloride 75 mL/hr at 01/12/14 0123    Past Medical History  Diagnosis Date  . Restrictive cardiomyopathy   . Atrial fibrillation 1996    S/P cardioversion  . CVA (cerebral infarction) 1999  . CPAP (continuous positive airway pressure) dependence   . Anemia     NOS  . Iron  deficiency   . Gout   . Peptic ulcer, acute with hemorrhage 2005, 2011  . Hypertension   . Hypothyroidism   . Diastolic dysfunction   . Adenomatous colon polyp   . GERD (gastroesophageal reflux disease)   . Hiatal hernia   . Diverticulosis   . Anxiety disorder   . Arthritis   . Internal and external hemorrhoids without complication   . Morbid obesity   . CAD (coronary artery disease) 08/06/2008    One vessel by cath, EF overall preserved by echo 04/30/2008  . On home oxygen therapy   . Renal failure     Dr. Hassell Done  . Hyperlipidemia   . CHF (congestive heart failure)   . Complication of anesthesia     pt states she was sore "all-over" for over a week after anesthesia  . COAD (chronic obstructive airways disease) 2007    Exacerbation  . COPD (chronic obstructive pulmonary disease)     oxygen prn  . Sleep apnea     cpap - no longer uses  . Stroke     x2 -- effected vision; left sided slightly weak  . Rash     on upper extremities  . Bowel trouble     having constipation and diarrhea  . Family history of anesthesia complication     daughter had same "sore all over" experience with anesthesia    Past Surgical History  Procedure Laterality Date  . Appendectomy    . Oophorectomy    . Incision and drainage of cellulitis  46/5035    Umbilical abcess  . Cardiac catheterization      Pulmonary HTN, non obstructive CAD  . Mole removal      on leg  . Vaginal hysterectomy    . Colonoscopy    . Bascilic vein transposition Left     Left upper arm    Maylon Peppers RD, LDN, [place] Pager [phone] After Hours Pager

## 2014-01-12 NOTE — Progress Notes (Addendum)
[facility] Progress Note  01/12/2014 7:49 AM POD 1  Subjective:  No complaints. Sitting up eating breakfast - pain well controlled with pain medication  Afebrile VSS   Filed Vitals:   01/12/14 0410  BP: 123/48  Pulse: 75  Temp: 96.8 F (36 C)  Resp: 18    Physical Exam: Incisions:  Bandage is in tact and dry.   CBC    Component Value Date/Time   WBC 8.7 01/12/2014 0407   RBC 3.13* 01/12/2014 0407   HGB 10.1* 01/12/2014 0407   HCT 32.3* 01/12/2014 0407   PLT 183 01/12/2014 0407   MCV 103.2* 01/12/2014 0407   MCH 32.3 01/12/2014 0407   MCHC 31.3 01/12/2014 0407   RDW 17.7* 01/12/2014 0407   LYMPHSABS 0.9 07/12/2011 1043   MONOABS 0.6 07/12/2011 1043   EOSABS 0.3 07/12/2011 1043   BASOSABS 0.0 07/12/2011 1043    BMET    Component Value Date/Time   NA 141 01/12/2014 0407   K 3.6* 01/12/2014 0407   CL 98 01/12/2014 0407   CO2 22 01/12/2014 0407   GLUCOSE 72 01/12/2014 0407   BUN 115* 01/12/2014 0407   CREATININE 3.33* 01/12/2014 0407   CALCIUM 7.9* 01/12/2014 0407   CALCIUM 9.9 12/01/2013 1300   GFRNONAA 12* 01/12/2014 0407   GFRAA 14* 01/12/2014 0407    INR    Component Value Date/Time   INR 1.43 10/20/2010 0430   INR 2.9 10/18/2010 1011     Intake/Output Summary (Last 24 hours) at 01/12/14 0749 Last data filed at 01/12/14 0600  Gross per 24 hour  Intake   2170 ml  Output    420 ml  Net   1750 ml     Assessment/Plan:  80 y.o. female is s/p right above knee amputation  POD 1  -pt doing well this am -pain is well controlled -PT/OT this am -await CIR consult -will take down dressing tomorrow.   Leontine Locket, PA-C [facility] [phone] 01/12/2014 7:49 AM  Addendum  I have independently interviewed and examined the patient, and I agree with the physician assistant's findings.  Pt's functional status was limited preop, so I expect some sort of rehab/SNF placement will be need postop.  Awaiting PT/OT/CIR consults.  Will need rehab for chronic  muscle wasting from poor nutrition and poor conditioning.   Sacral decubitus from limited mobility.  Consult wound care for mgmt of such.  Nutrition c/s for severe protein malnutrition.    Adele Barthel, MD [facility] [place] Office: [phone] Pager: [phone]  01/12/2014, 8:27 AM

## 2014-01-12 NOTE — Progress Notes (Signed)
Rehab Admissions Coordinator Note:  Patient was screened by Retta Diones for appropriateness for an Inpatient Acute Rehab Consult.  At this time, an inpatient rehab consult has been ordered and is pending completion.  Retta Diones 01/12/2014, 4:56 PM  I can be reached at [phone]

## 2014-01-13 ENCOUNTER — Encounter (HOSPITAL_BASED_OUTPATIENT_CLINIC_OR_DEPARTMENT_OTHER): Payer: Medicare Other | Attending: General Surgery

## 2014-01-13 DIAGNOSIS — E43 Unspecified severe protein-calorie malnutrition: Secondary | ICD-10-CM | POA: Insufficient documentation

## 2014-01-13 LAB — CBC
HCT: 31.6 % — ABNORMAL LOW (ref 36.0–46.0)
Hemoglobin: 9.8 g/dL — ABNORMAL LOW (ref 12.0–15.0)
MCH: 32.1 pg (ref 26.0–34.0)
MCHC: 31 g/dL (ref 30.0–36.0)
MCV: 103.6 fL — ABNORMAL HIGH (ref 78.0–100.0)
PLATELETS: 161 10*3/uL (ref 150–400)
RBC: 3.05 MIL/uL — ABNORMAL LOW (ref 3.87–5.11)
RDW: 17.8 % — AB (ref 11.5–15.5)
WBC: 8.1 10*3/uL (ref 4.0–10.5)

## 2014-01-13 LAB — BASIC METABOLIC PANEL
BUN: 109 mg/dL — ABNORMAL HIGH (ref 6–23)
CALCIUM: 8 mg/dL — AB (ref 8.4–10.5)
CO2: 23 mEq/L (ref 19–32)
CREATININE: 3.21 mg/dL — AB (ref 0.50–1.10)
Chloride: 99 mEq/L (ref 96–112)
GFR, EST AFRICAN AMERICAN: 14 mL/min — AB (ref >90)
GFR, EST NON AFRICAN AMERICAN: 12 mL/min — AB (ref >90)
Glucose, Bld: 89 mg/dL (ref 70–99)
Potassium: 2.8 mEq/L — CL (ref 3.7–5.3)
Sodium: 141 mEq/L (ref 137–147)

## 2014-01-13 MED ORDER — DARBEPOETIN ALFA-POLYSORBATE 25 MCG/0.42ML IJ SOLN
25.0000 ug | INTRAMUSCULAR | Status: DC
  Administered 2014-01-13: 25 ug via SUBCUTANEOUS
  Filled 2014-01-13: qty 0.42

## 2014-01-13 MED ORDER — POTASSIUM CHLORIDE 20 MEQ/15ML (10%) PO LIQD
40.0000 meq | Freq: Once | ORAL | Status: AC
  Administered 2014-01-13: 40 meq via ORAL
  Filled 2014-01-13: qty 30

## 2014-01-13 NOTE — Progress Notes (Signed)
I met pt and her spouse at bedside and then contacted her daughter, Hilda Blades, to arrange a time today to meet and discuss inpt rehab venue as an option for her rehab recovery. We are to meet at 1415 today. [phone]

## 2014-01-13 NOTE — Progress Notes (Signed)
CRITICAL VALUE ALERT  Critical value received:  Potassium 2.8  Date of notification:  01/13/2014  Time of notification: 0430  Critical value read back:yes  Nurse who received alert:  K. Herbert Deaner  MD notified (1st page):  yes  Time of first page:  [phone]

## 2014-01-13 NOTE — Consult Note (Signed)
WOC wound consult note Reason for Consult: Pressure ulcer to sacrum, Stage II.  Present on admission.  Wound type: Pressure ulcer Pressure Ulcer POA: Yes Measurement: 4.5 cm x 3.3 cm x 0.1 cm Wound bed: 100% pink, moist wound bed.  Drainage (amount, consistency, odor) Minimal serous drainage.   Periwound: Intact, pink Dressing procedure/placement/frequency: Cleanse sacrum with NS and pat gently dry.  Apply SensiCare #3 barrier cream Kellie Simmering [phone]) to Stage II pressure ulcer.  Turn and reposition patient every 2 hours.  Will not follow at this time.  Please re-consult if needed.  Domenic Moras RN BSN [place] Pager [phone]

## 2014-01-13 NOTE — Clinical Documentation Improvement (Signed)
THIS DOCUMENT IS NOT A PERMANENT PART OF THE MEDICAL RECORD  Please update your documentation with the medical record to reflect your response to this query. If you need help knowing how to do this please call [phone].  01/13/14   Dr. Bridgett Larsson and/or Associates,  In a better effort to capture your patient's severity of illness, reflect appropriate length of stay and utilization of resources, a review of the patient medical record has revealed the following indicators:   - Described as "emaciated" with "temporalis wasting" in current H&P   Registered Dietician Assessment 2013/01/15  DOCUMENTATION CODES  Per approved criteria   -Severe malnutrition in the context of chronic illness   INTERVENTION:  Resource Breeze po TID, each supplement provides 250 kcal and 9 grams of protein  NUTRITION DIAGNOSIS:  Malnutrition related to chronic disease as evidenced by severe fat and muscle wasting.  Goal:  Pt to meet >/= 90% of their estimated nutrition needs Monitor:  Diet advancement, PO intake, weight trend, labs  Reason for Assessment: Pt identified as at nutrition risk on the Malnutrition Screen Tool   Based on your clinical judgment, please document in the progress notes and discharge summary if you agree that a condition below provides greater specificity regarding the patient's current nutritional status:   - Severe Malnutrition   - Other Condition   - Unable to Clinically Determine   In responding to this query please exercise your independent judgment.  The fact that a query is asked, does not imply that any particular answer is desired or expected.   You may use possible, probable, or suspect with inpatient documentation.   possible, probable, suspected diagnoses MUST be documented at the time of discharge  Reviewed: additional documentation in the medical record  Thank You,  Erling Conte  RN BSN CCDS Certified Clinical Documentation Specialist: [facility]

## 2014-01-13 NOTE — Progress Notes (Signed)
I met with pt and her family at bedside. Discussed inpt rehab venue for her rehab and they are in agreement. I will verify bed availability for tomorrow and I have discussed with Dr. Bridgett Larsson at bedside. I will alert RN CM. [phone]

## 2014-01-13 NOTE — Clinical Documentation Improvement (Signed)
THIS DOCUMENT IS NOT A PERMANENT PART OF THE MEDICAL RECORD  Please update your documentation with the medical record to reflect your response to this query. If you need help knowing how to do this please call [phone]  01/13/14  Dr. Bridgett Larsson,  In a better effort to capture your patient's severity of illness, reflect appropriate length of stay and utilization of resources, a review of the patient medical record has revealed the following indicators -    - "Pink Stage 2 Pressure Ulcer on Coccyx, present pat admission.  Per patient, she likes to sit and lay on the couch" documented by nursing on Doc Flowsheets   - "Stage 2 pressure ulcer on coccyx" per Registered Dietician Assessment 01/12/2014   - "Severe Malnutrition with muscle wasting" (see RD consult for details) per Registered Dietician Assessment 01/12/2014       Based on your clinical judgment, please document in the progress notes and discharge summary if you agree with this assessment, including if the ulcer was Present on Admission                       Stage  I  Pressure Ulcer   (reddening of the skin)  Stage  II Pressure Ulcer  (blister open or unopened)  Stage  III Pressure Ulcer (through all layers skin)  Stage IV Pressure Ulcer   (through skin & underlying  muscle, tendons, and bones)  Other Condition  Unable to Clinically Determine     Reviewed: additional documentation in the medical record  Thank You,  Erling Conte  Clinical Documentation Specialist: [place]

## 2014-01-13 NOTE — Progress Notes (Addendum)
[facility] Progress Note  01/13/2014 7:52 AM POD 2  Subjective:  "still a little sore"  Tm 99 now afebrile VSS 93% RA Filed Vitals:   01/13/14 0325  BP: 108/43  Pulse: 76  Temp: 98 F (36.7 C)  Resp: 16    Physical Exam: Incisions:  C/d/i with staples in tact   CBC    Component Value Date/Time   WBC 8.1 01/13/2014 0305   RBC 3.05* 01/13/2014 0305   HGB 9.8* 01/13/2014 0305   HCT 31.6* 01/13/2014 0305   PLT 161 01/13/2014 0305   MCV 103.6* 01/13/2014 0305   MCH 32.1 01/13/2014 0305   MCHC 31.0 01/13/2014 0305   RDW 17.8* 01/13/2014 0305   LYMPHSABS 0.9 07/12/2011 1043   MONOABS 0.6 07/12/2011 1043   EOSABS 0.3 07/12/2011 1043   BASOSABS 0.0 07/12/2011 1043    BMET    Component Value Date/Time   NA 141 01/13/2014 0305   K 2.8* 01/13/2014 0305   CL 99 01/13/2014 0305   CO2 23 01/13/2014 0305   GLUCOSE 89 01/13/2014 0305   BUN 109* 01/13/2014 0305   CREATININE 3.21* 01/13/2014 0305   CALCIUM 8.0* 01/13/2014 0305   CALCIUM 9.9 12/01/2013 1300   GFRNONAA 12* 01/13/2014 0305   GFRAA 14* 01/13/2014 0305   INR    Component Value Date/Time   INR 1.43 10/20/2010 0430   INR 2.9 10/18/2010 1011    Intake/Output Summary (Last 24 hours) at 01/13/14 7124 Last data filed at 01/12/14 2358  Gross per 24 hour  Intake 1532.5 ml  Output   1150 ml  Net  382.5 ml   Assessment/Plan:  80 y.o. female is s/p right above knee amputation  POD 2  -right AKA stump is viable -hypokalemia this am - supplemented with potassium 40 mEq -disposition pending CIR recommendation  Leontine Locket, PA-C [facility] [phone] 01/13/2014 7:52 AM  Addendum  I have independently interviewed and examined the patient, and I agree with the physician assistant's findings.  R AKA viable and c/d/i, staples in place.  Apparently pt good CIR candidate.  Other active medical problems:  Sacral decubitus Stage 2: mgmt per Wound care nursing  CAD: no sx, stable H/H  Severe protein  malnutrition: mgmt per Nutrition  Chronic anemia associated with iron deficiency and chronic kidney disease stage IV: pt is due to her Epo shot, will give Darbopoeitin today.    Adele Barthel, MD [facility] [place] Office: [phone] Pager: [phone]  01/13/2014, 2:37 PM

## 2014-01-14 ENCOUNTER — Encounter (HOSPITAL_COMMUNITY): Payer: Self-pay | Admitting: *Deleted

## 2014-01-14 ENCOUNTER — Inpatient Hospital Stay (HOSPITAL_COMMUNITY)
Admission: AD | Admit: 2014-01-14 | Discharge: 2014-01-27 | DRG: 945 | Disposition: A | Discharge status: Home Health | Payer: Medicare Other | Source: Intra-hospital | Attending: Physical Medicine & Rehabilitation | Admitting: Physical Medicine & Rehabilitation

## 2014-01-14 DIAGNOSIS — K551 Chronic vascular disorders of intestine: Secondary | ICD-10-CM | POA: Diagnosis present

## 2014-01-14 DIAGNOSIS — R630 Anorexia: Secondary | ICD-10-CM | POA: Diagnosis present

## 2014-01-14 DIAGNOSIS — F411 Generalized anxiety disorder: Secondary | ICD-10-CM | POA: Diagnosis present

## 2014-01-14 DIAGNOSIS — I2510 Atherosclerotic heart disease of native coronary artery without angina pectoris: Secondary | ICD-10-CM | POA: Diagnosis present

## 2014-01-14 DIAGNOSIS — Z9981 Dependence on supplemental oxygen: Secondary | ICD-10-CM

## 2014-01-14 DIAGNOSIS — I509 Heart failure, unspecified: Secondary | ICD-10-CM | POA: Diagnosis present

## 2014-01-14 DIAGNOSIS — Z89619 Acquired absence of unspecified leg above knee: Secondary | ICD-10-CM

## 2014-01-14 DIAGNOSIS — I700 Atherosclerosis of aorta: Secondary | ICD-10-CM | POA: Diagnosis present

## 2014-01-14 DIAGNOSIS — R634 Abnormal weight loss: Secondary | ICD-10-CM | POA: Diagnosis present

## 2014-01-14 DIAGNOSIS — N189 Chronic kidney disease, unspecified: Secondary | ICD-10-CM | POA: Diagnosis present

## 2014-01-14 DIAGNOSIS — I129 Hypertensive chronic kidney disease with stage 1 through stage 4 chronic kidney disease, or unspecified chronic kidney disease: Secondary | ICD-10-CM | POA: Diagnosis present

## 2014-01-14 DIAGNOSIS — I4891 Unspecified atrial fibrillation: Secondary | ICD-10-CM

## 2014-01-14 DIAGNOSIS — I70209 Unspecified atherosclerosis of native arteries of extremities, unspecified extremity: Secondary | ICD-10-CM | POA: Diagnosis present

## 2014-01-14 DIAGNOSIS — Z5189 Encounter for other specified aftercare: Principal | ICD-10-CM

## 2014-01-14 DIAGNOSIS — R1013 Epigastric pain: Secondary | ICD-10-CM

## 2014-01-14 DIAGNOSIS — J4489 Other specified chronic obstructive pulmonary disease: Secondary | ICD-10-CM | POA: Diagnosis present

## 2014-01-14 DIAGNOSIS — K5900 Constipation, unspecified: Secondary | ICD-10-CM | POA: Diagnosis present

## 2014-01-14 DIAGNOSIS — E876 Hypokalemia: Secondary | ICD-10-CM | POA: Diagnosis present

## 2014-01-14 DIAGNOSIS — I428 Other cardiomyopathies: Secondary | ICD-10-CM | POA: Diagnosis present

## 2014-01-14 DIAGNOSIS — J449 Chronic obstructive pulmonary disease, unspecified: Secondary | ICD-10-CM | POA: Diagnosis present

## 2014-01-14 DIAGNOSIS — I739 Peripheral vascular disease, unspecified: Secondary | ICD-10-CM

## 2014-01-14 DIAGNOSIS — E785 Hyperlipidemia, unspecified: Secondary | ICD-10-CM | POA: Diagnosis present

## 2014-01-14 DIAGNOSIS — K219 Gastro-esophageal reflux disease without esophagitis: Secondary | ICD-10-CM

## 2014-01-14 DIAGNOSIS — E039 Hypothyroidism, unspecified: Secondary | ICD-10-CM | POA: Diagnosis present

## 2014-01-14 DIAGNOSIS — D649 Anemia, unspecified: Secondary | ICD-10-CM

## 2014-01-14 DIAGNOSIS — G8929 Other chronic pain: Secondary | ICD-10-CM | POA: Diagnosis present

## 2014-01-14 DIAGNOSIS — S78119A Complete traumatic amputation at level between unspecified hip and knee, initial encounter: Secondary | ICD-10-CM

## 2014-01-14 DIAGNOSIS — N184 Chronic kidney disease, stage 4 (severe): Secondary | ICD-10-CM

## 2014-01-14 DIAGNOSIS — R64 Cachexia: Secondary | ICD-10-CM | POA: Diagnosis present

## 2014-01-14 DIAGNOSIS — L98499 Non-pressure chronic ulcer of skin of other sites with unspecified severity: Secondary | ICD-10-CM

## 2014-01-14 DIAGNOSIS — D62 Acute posthemorrhagic anemia: Secondary | ICD-10-CM | POA: Diagnosis present

## 2014-01-14 HISTORY — DX: Angiodysplasia of colon without hemorrhage: K55.20

## 2014-01-14 LAB — CBC
HCT: 33.4 % — ABNORMAL LOW (ref 36.0–46.0)
HEMOGLOBIN: 10.3 g/dL — AB (ref 12.0–15.0)
MCH: 32 pg (ref 26.0–34.0)
MCHC: 30.8 g/dL (ref 30.0–36.0)
MCV: 103.7 fL — ABNORMAL HIGH (ref 78.0–100.0)
PLATELETS: 183 10*3/uL (ref 150–400)
RBC: 3.22 MIL/uL — ABNORMAL LOW (ref 3.87–5.11)
RDW: 17.6 % — ABNORMAL HIGH (ref 11.5–15.5)
WBC: 8.2 10*3/uL (ref 4.0–10.5)

## 2014-01-14 LAB — CREATININE, SERUM
CREATININE: 3.01 mg/dL — AB (ref 0.50–1.10)
GFR calc Af Amer: 16 mL/min — ABNORMAL LOW (ref >90)
GFR calc non Af Amer: 13 mL/min — ABNORMAL LOW (ref >90)

## 2014-01-14 LAB — POTASSIUM: POTASSIUM: 2.7 meq/L — AB (ref 3.7–5.3)

## 2014-01-14 MED ORDER — ACETAMINOPHEN 650 MG RE SUPP: 325.0000 mg | RECTAL | Status: DC | PRN

## 2014-01-14 MED ORDER — OXYCODONE HCL 5 MG PO TABS
5.0000 mg | ORAL_TABLET | ORAL | Status: DC | PRN
  Administered 2014-01-14 – 2014-01-19 (×13): 10 mg via ORAL
  Filled 2014-01-14 (×13): qty 2

## 2014-01-14 MED ORDER — POTASSIUM CHLORIDE CRYS ER 20 MEQ PO TBCR
40.0000 meq | EXTENDED_RELEASE_TABLET | Freq: Two times a day (BID) | ORAL | Status: DC
  Administered 2014-01-14 – 2014-01-17 (×6): 40 meq via ORAL
  Filled 2014-01-14 (×8): qty 2

## 2014-01-14 MED ORDER — TORSEMIDE 20 MG PO TABS
140.0000 mg | ORAL_TABLET | Freq: Every day | ORAL | Status: DC
  Administered 2014-01-15 – 2014-01-27 (×13): 140 mg via ORAL
  Filled 2014-01-14 (×15): qty 2

## 2014-01-14 MED ORDER — SORBITOL 70 % SOLN: 30.0000 mL | Freq: Every day | Status: DC | PRN

## 2014-01-14 MED ORDER — BOOST / RESOURCE BREEZE PO LIQD
1.0000 | Freq: Three times a day (TID) | ORAL | Status: DC
  Administered 2014-01-14 – 2014-01-15 (×3): 1 via ORAL

## 2014-01-14 MED ORDER — LOSARTAN POTASSIUM 50 MG PO TABS
50.0000 mg | ORAL_TABLET | Freq: Every day | ORAL | Status: DC
  Administered 2014-01-15 – 2014-01-24 (×10): 50 mg via ORAL
  Filled 2014-01-14 (×12): qty 1

## 2014-01-14 MED ORDER — WHITE PETROLATUM GEL: Status: DC | PRN

## 2014-01-14 MED ORDER — POTASSIUM CHLORIDE CRYS ER 20 MEQ PO TBCR
40.0000 meq | EXTENDED_RELEASE_TABLET | Freq: Two times a day (BID) | ORAL | Status: DC
  Administered 2014-01-14: 40 meq via ORAL
  Filled 2014-01-14: qty 2

## 2014-01-14 MED ORDER — PHENOL 1.4 % MT LIQD
1.0000 | OROMUCOSAL | Status: DC | PRN
  Filled 2014-01-14: qty 177

## 2014-01-14 MED ORDER — ENOXAPARIN SODIUM 30 MG/0.3ML ~~LOC~~ SOLN
30.0000 mg | SUBCUTANEOUS | Status: DC
  Administered 2014-01-15 – 2014-01-18 (×4): 30 mg via SUBCUTANEOUS
  Filled 2014-01-14 (×5): qty 0.3

## 2014-01-14 MED ORDER — METOLAZONE 2.5 MG PO TABS
2.5000 mg | ORAL_TABLET | ORAL | Status: DC
  Administered 2014-01-15 – 2014-01-27 (×7): 2.5 mg via ORAL
  Filled 2014-01-14 (×8): qty 1

## 2014-01-14 MED ORDER — ACETAMINOPHEN 325 MG PO TABS
325.0000 mg | ORAL_TABLET | ORAL | Status: DC | PRN
  Administered 2014-01-15 – 2014-01-27 (×8): 650 mg via ORAL
  Filled 2014-01-14 (×8): qty 2

## 2014-01-14 MED ORDER — PANTOPRAZOLE SODIUM 40 MG PO TBEC
40.0000 mg | DELAYED_RELEASE_TABLET | Freq: Every day | ORAL | Status: DC
  Administered 2014-01-15 – 2014-01-27 (×13): 40 mg via ORAL
  Filled 2014-01-14 (×13): qty 1

## 2014-01-14 MED ORDER — ONDANSETRON HCL 4 MG PO TABS
4.0000 mg | ORAL_TABLET | Freq: Four times a day (QID) | ORAL | Status: DC | PRN
  Administered 2014-01-18 – 2014-01-20 (×3): 4 mg via ORAL
  Filled 2014-01-14 (×4): qty 1

## 2014-01-14 MED ORDER — BISACODYL 10 MG RE SUPP
10.0000 mg | Freq: Every day | RECTAL | Status: DC | PRN
  Filled 2014-01-14 (×2): qty 1

## 2014-01-14 MED ORDER — VITAMIN C 500 MG PO TABS
500.0000 mg | ORAL_TABLET | Freq: Every day | ORAL | Status: DC
  Administered 2014-01-15 – 2014-01-27 (×13): 500 mg via ORAL
  Filled 2014-01-14 (×15): qty 1

## 2014-01-14 MED ORDER — ENOXAPARIN SODIUM 30 MG/0.3ML ~~LOC~~ SOLN: 30.0000 mg | SUBCUTANEOUS | Status: DC

## 2014-01-14 MED ORDER — ALLOPURINOL 100 MG PO TABS
100.0000 mg | ORAL_TABLET | ORAL | Status: DC
  Administered 2014-01-15 – 2014-01-27 (×7): 100 mg via ORAL
  Filled 2014-01-14 (×8): qty 1

## 2014-01-14 MED ORDER — DARBEPOETIN ALFA-POLYSORBATE 25 MCG/0.42ML IJ SOLN
25.0000 ug | INTRAMUSCULAR | Status: DC
  Administered 2014-01-20 – 2014-01-27 (×2): 25 ug via SUBCUTANEOUS
  Filled 2014-01-14 (×4): qty 0.42

## 2014-01-14 MED ORDER — DOCUSATE SODIUM 100 MG PO CAPS
100.0000 mg | ORAL_CAPSULE | Freq: Every day | ORAL | Status: DC
Start: 2014-01-15 — End: 2014-01-19
  Administered 2014-01-15 – 2014-01-19 (×4): 100 mg via ORAL
  Filled 2014-01-14 (×6): qty 1

## 2014-01-14 MED ORDER — CALCITRIOL 0.5 MCG PO CAPS
0.5000 ug | ORAL_CAPSULE | ORAL | Status: DC
  Administered 2014-01-16 – 2014-01-26 (×6): 0.5 ug via ORAL
  Filled 2014-01-14 (×7): qty 1

## 2014-01-14 MED ORDER — ONDANSETRON HCL 4 MG/2ML IJ SOLN: 4.0000 mg | Freq: Four times a day (QID) | INTRAMUSCULAR | Status: DC | PRN

## 2014-01-14 MED ORDER — LEVOTHYROXINE SODIUM 75 MCG PO TABS
75.0000 ug | ORAL_TABLET | Freq: Every day | ORAL | Status: DC
  Administered 2014-01-15 – 2014-01-27 (×13): 75 ug via ORAL
  Filled 2014-01-14 (×15): qty 1

## 2014-01-14 MED ORDER — SENNOSIDES-DOCUSATE SODIUM 8.6-50 MG PO TABS: 1.0000 | ORAL_TABLET | Freq: Every evening | ORAL | Status: DC | PRN

## 2014-01-14 NOTE — PMR Pre-admission (Signed)
PMR Admission Coordinator Pre-Admission Assessment  Patient: [name] is an 80 y.o., female MRN: [id number] DOB: 11-05-1929 Height: '4\' 10"'  (147.3 cm) Weight: 49.3 kg (108 lb 11 oz)              Insurance Information HMO:     PPO:      PCP:      IPA:      80/20: yes     OTHER: no HMO PRIMARY: medicare a and b      Policy#: [id number]      Subscriber: pt Benefits:  Phone #: online     Name: 01/13/14 Eff. Date: 03/10/96     Deduct: $1260      Out of Pocket Max: none      Life Max: none CIR: 100%      SNF: 20 full days Outpatient: 80%     Co-Pay: 20% Home Health: 100%      Co-Pay: none DME: 80%     Co-Pay: 20% Providers: pt choice  SECONDARY: [employer]      Policy#: [id number]      Subscriber: pt  Medicaid Application Date:       Case Manager:   Emergency Contact Information Contact Information   Name Relation Home Work [place] Spouse [phone]     [name] Daughter [phone]  [phone]     Current Medical History  Patient Admitting Diagnosis: Right AKA  History of Present Illness: [name] is a 80 y.o. right-handed female with history of atrial fibrillation status post cardioversion, restrictive cardiomyopathy, CVA 1999 with mild left-sided weakness and chronic renal insufficiency with baseline creatinine 3.48-3.76.  Admitted on 01/11/2014 with right lower extremity pain as well as right medial leg ulcers with serous drainage. Patient underwent recent angiographic procedures for evaluation of right lower extremity pain showing severe proximal stenosis and poor runoff. Limb was not felt to be salvageable. Underwent right above-knee amputation 01/11/2014 per Dr. Bridgett Larsson. Postoperative pain management. Subcutaneous Lovenox added for DVT prophylaxis.   Past Medical History  Past Medical History  Diagnosis Date  . Restrictive cardiomyopathy   . Atrial fibrillation 1996    S/P cardioversion  . CVA (cerebral infarction) 1999  . CPAP  (continuous positive airway pressure) dependence   . Anemia     NOS  . Iron deficiency   . Gout   . Peptic ulcer, acute with hemorrhage 2005, 2011  . Hypertension   . Hypothyroidism   . Diastolic dysfunction   . Adenomatous colon polyp   . GERD (gastroesophageal reflux disease)   . Hiatal hernia   . Diverticulosis   . Anxiety disorder   . Arthritis   . Internal and external hemorrhoids without complication   . Morbid obesity   . CAD (coronary artery disease) 08/06/2008    One vessel by cath, EF overall preserved by echo 04/30/2008  . On home oxygen therapy   . Renal failure     Dr. Hassell Done  . Hyperlipidemia   . CHF (congestive heart failure)   . Complication of anesthesia     pt states she was sore "all-over" for over a week after anesthesia  . COAD (chronic obstructive airways disease) 2007    Exacerbation  . COPD (chronic obstructive pulmonary disease)     oxygen prn  . Sleep apnea     cpap - no longer uses  . Stroke     x2 -- effected vision; left sided slightly weak  . Rash  on upper extremities  . Bowel trouble     having constipation and diarrhea  . Family history of anesthesia complication     daughter had same "sore all over" experience with anesthesia    Family History  family history includes Breast cancer in her mother and sister; Cancer in her mother and sister; Heart attack in her father; Heart disease in her brother and father; Peripheral vascular disease in her sister; Rheum arthritis in her brother. There is no history of Colon cancer.  Prior Rehab/Hospitalizations: none   Current Medications  Current facility-administered medications:acetaminophen (TYLENOL) suppository 325-650 mg, 325-650 mg, Rectal, Q4H PRN, Conrad [place], MD;  acetaminophen (TYLENOL) tablet 325-650 mg, 325-650 mg, Oral, Q4H PRN, Conrad [place], MD, 650 mg at 01/14/14 7371;  allopurinol (ZYLOPRIM) tablet 100 mg, 100 mg, Oral, QODAY, Conrad [place], MD, 100 mg at 01/13/14 0626 alum & mag  hydroxide-simeth (MAALOX/MYLANTA) 200-200-20 MG/5ML suspension 15-30 mL, 15-30 mL, Oral, Q2H PRN, Conrad [place], MD;  bisacodyl (DULCOLAX) suppository 10 mg, 10 mg, Rectal, Daily PRN, Conrad Maybee, MD;  calcitRIOL (ROCALTROL) capsule 0.5 mcg, 0.5 mcg, Oral, QODAY, Conrad [place], MD, 0.5 mcg at 01/12/14 1026;  darbepoetin (ARANESP) injection 25 mcg, 25 mcg, Subcutaneous, Q7 days, Conrad [place], MD, 25 mcg at 01/13/14 1815 docusate sodium (COLACE) capsule 100 mg, 100 mg, Oral, Daily, Conrad Blanchard, MD, 100 mg at 01/13/14 9485;  enoxaparin (LOVENOX) injection 30 mg, 30 mg, Subcutaneous, Q24H, Conrad Fisher, MD, 30 mg at 01/13/14 1527;  feeding supplement (RESOURCE BREEZE) (RESOURCE BREEZE) liquid 1 Container, 1 Container, Oral, TID BM, Heather Cornelison Charlie Pitter, RD, 1 Container at 01/13/14 2038 guaiFENesin-dextromethorphan (ROBITUSSIN DM) 100-10 MG/5ML syrup 15 mL, 15 mL, Oral, Q4H PRN, Conrad [place], MD;  hydrALAZINE (APRESOLINE) injection 10 mg, 10 mg, Intravenous, Q2H PRN, Conrad [place], MD;  labetalol (NORMODYNE,TRANDATE) injection 10 mg, 10 mg, Intravenous, Q2H PRN, Conrad [place], MD;  levothyroxine (SYNTHROID, LEVOTHROID) tablet 75 mcg, 75 mcg, Oral, QAC breakfast, Conrad [place], MD, 75 mcg at 01/14/14 0545 losartan (COZAAR) tablet 50 mg, 50 mg, Oral, Daily, Conrad [place], MD, 50 mg at 01/13/14 4627;  metolazone (ZAROXOLYN) tablet 2.5 mg, 2.5 mg, Oral, QODAY, Conrad [place], MD, 2.5 mg at 01/13/14 0350;  metoprolol (LOPRESSOR) injection 2-5 mg, 2-5 mg, Intravenous, Q2H PRN, Conrad Narc[place], MD;  morphine 2 MG/ML injection 2-5 mg, 2-5 mg, Intravenous, Q1H PRN, Conrad Nelson, MD, 4 mg at 01/12/14 0603 ondansetron [facility]) injection 4 mg, 4 mg, Intravenous, Q6H PRN, Conrad W[place], MD;  oxyCODONE (Oxy IR/ROXICODONE) immediate release tablet 5-10 mg, 5-10 mg, Oral, Q4H PRN, Conrad [place], MD, 10 mg at 01/14/14 0545;  pantoprazole (PROTONIX) EC tablet 40 mg, 40 mg, Oral, Daily, Conrad [place], MD, 40 mg at 01/13/14 0938;  phenol  (CHLORASEPTIC) mouth spray 1 spray, 1 spray, Mouth/Throat, PRN, Conrad [place], MD potassium chloride SA (K-DUR,KLOR-CON) CR tablet 40 mEq, 40 mEq, Oral, BID, Emma M Collins, PA-C;  senna-docusate (Senokot-S) tablet 1 tablet, 1 tablet, Oral, QHS PRN, Conrad [place], MD;  torsemide [facility]) tablet 140 mg, 140 mg, Oral, Daily, Conrad [place], MD, 140 mg at 01/13/14 1829;  vitamin C (ASCORBIC ACID) tablet 500 mg, 500 mg, Oral, Daily, Conrad [place], MD, 500 mg at 01/13/14 9371 white petrolatum (VASELINE) gel, , Topical, PRN, Conrad , MD  Patients Current Diet: Cardiac  Precautions / Restrictions Precautions Precautions: Fall Restrictions Weight Bearing Restrictions: Yes  RLE Weight Bearing:  (R AKA )   Prior Activity Level Household: Out for MD appts only for about one year. Seeminly sedentary. Was Mod I on cane 6 weeks ago short distances. Spouse has been cooking and driving for around 5 yrs. He pushes pt in wheelchair for all outside appointments.   Home Assistive Devices / Equipment Home Assistive Devices/Equipment: Wheelchair;Oxygen;Walker (specify type);Bedside commode/3-in-1;Cane (specify quad or straight);Raised toilet seat with rails;Eyeglasses Home Equipment: Walker - 4 wheels;Cane - single point;Bedside commode;Wheelchair - manual  Prior Functional Level Prior Function Level of Independence: Needs assistance Gait / Transfers Assistance Needed: using cane primarily; assist on stairs into home ADL's / Homemaking Assistance Needed: sponge bathe w assist, assist to donn bra and occasionally pants, assist to toilet. Comments: fell 3 weeks ago--slipped off the bed  Current Functional Level Cognition  Overall Cognitive Status: Within Functional Limits for tasks assessed Orientation Level: Oriented X4    Extremity Assessment (includes Sensation/Coordination)          ADLs  Eating/Feeding: Performed;Set up Where Assessed - Eating/Feeding: Bed level Grooming: Performed;Wash/dry  hands;Wash/dry face;Teeth care;Set up Where Assessed - Grooming: Supported sitting Upper Body Bathing: Simulated;Set up Where Assessed - Upper Body Bathing: Supported sitting Lower Body Bathing: Simulated;Moderate assistance Where Assessed - Lower Body Bathing: Supine, head of bed up Upper Body Dressing: Simulated;Minimal assistance Where Assessed - Upper Body Dressing: Supported sitting Lower Body Dressing: Simulated;Maximal assistance Where Assessed - Lower Body Dressing: Supine, head of bed up Toilet Transfer: Other (comment) (unable to assess) Transfers/Ambulation Related to ADLs: Pt unable to stand or ambulate at this time. ADL Comments: Pt does well with adls at bed level and at side of bed.  Pt very motivated and willing to try anything despite her pain.     Mobility  Overal bed mobility: Needs Assistance Bed Mobility: Rolling;Sidelying to Sit;Sit to Sidelying Rolling: Min assist Sidelying to sit: Max assist;HOB elevated Sit to sidelying: Max assist General bed mobility comments: limited by pain and weakness    Transfers  Overall transfer level: Needs assistance Equipment used: None Transfers: Lateral/Scoot Transfers  Lateral/Scoot Transfers: Total assist General transfer comment: along EOB to her Rt; limited use of LLE due to height of bed and difficulty reaching the floor    Ambulation / Gait / Stairs / Wheelchair Mobility       Posture / Balance Dynamic Sitting Balance Sitting balance - Comments: posterior lean; able to maintain sitting with bil UE support with minguard assist for up to 30 sec    Special needs/care consideration Pt followed by Dr. Joelyn Oms, nephrology , due to CKD. Patient has upper left arm fistula. No sticks left arm. Never used.  Skin  WOC wound consult note 01/13/14 Reason for Consult: Pressure ulcer to sacrum, Stage II. Present on admission.  Wound type: Pressure ulcer  Pressure Ulcer POA: Yes  Measurement: 4.5 cm x 3.3 cm x 0.1 cm  Wound bed:  100% pink, moist wound bed.  Drainage (amount, consistency, odor) Minimal serous drainage.  Periwound: Intact, pink  Dressing procedure/placement/frequency: Cleanse sacrum with NS and pat gently dry. Apply SensiCare #3 barrier cream Kellie Simmering [phone]) to Stage II pressure ulcer. Turn and reposition patient every 2 hours.  Will not follow at this time. Please re-consult if needed.  Domenic Moras RN BSN CWON  Pager [phone]                          Bowel mgmt: continent Bladder mgmt:  continent   Previous Home Environment Living Arrangements: Spouse/significant other  Lives With: Spouse Available Help at Discharge: Family;Available 24 hours/day Type of Home: House Home Layout: Two level;Able to live on main level with bedroom/bathroom Alternate Level Stairs-Number of Steps: 2 Home Access: Stairs to enter;Other (comment) (family to build ramp before pt d/c home; I discussed with fa) Bathroom Shower/Tub: Tub/shower unit;Curtain Bathroom Toilet: Standard Bathroom Accessibility: No (unable to get into bathroom with walker or wheelchair. famil) Home Care Services: No Additional Comments: family Mod I via cane until 6 weeks ago. Has not drove in years. Uses wheelchair in the community with spouse for around 5 years  Discharge Living Setting Plans for Discharge Living Setting: Patient's home;Lives with (comment) (spouse) Type of Home at Discharge: House Discharge Home Layout: Two level;Able to live on main level with bedroom/bathroom Discharge Home Access: Stairs to enter;Other (comment) (family to build ramp) Discharge Bathroom Shower/Tub: Tub/shower unit;Other (comment) (pt's granddaughter, Blenda Nicely, has been sponge bathing) Discharge Bathroom Toilet: Standard Discharge Bathroom Accessibility: No Does the patient have any problems obtaining your medications?: No Spouse arrive every day before 8 am and stays until 8 pm every night. Family come every day after lunch. Very involved and  supportive of the couple.  Social/Family/Support Systems Patient Roles: Spouse;Parent Contact Information: Evelette Hollern and daughter, [name] Anticipated Caregiver: spouse 56 yo on cane with bad left knee. He can provide supervision only; daughter and granddtrs are CNA and will provide the physical assist Anticipated Caregiver's Contact Information: see above Ability/Limitations of Caregiver: spouse supervision; other family members the physical care Caregiver Availability: 24/7 Discharge Plan Discussed with Primary Caregiver: Yes (I met with spouse, pt, dtr and granddtrs 01/13/14) Is Caregiver In Agreement with Plan?: Yes Does Caregiver/Family have Issues with Lodging/Transportation while Pt is in Rehab?: No  Goals/Additional Needs Patient/Family Goal for Rehab: supervision to min assist short distances PT, supervision to min assist OT Expected length of stay: ELOS 12 to 15 days Special Service Needs: Family to build ramp and they are already making those arrangements Pt/Family Agrees to Admission and willing to participate: Yes Program Orientation Provided & Reviewed with Pt/Caregiver Including Roles  & Responsibilities: Yes   Decrease burden of Care through IP rehab admission: n/a  Possible need for SNF placement upon discharge:not anticipated ever.  Patient Condition: This patient's medical and functional status has changed since the consult dated: 01/11/14 in which the Rehabilitation Physician determined and documented that the patient's condition is appropriate for intensive rehabilitative care in an inpatient rehabilitation facility. See "History of Present Illness" (above) for medical update. Functional changes are: max assist to EOB due to pain. Patient's medical and functional status update has been discussed with the Rehabilitation physician and patient remains appropriate for inpatient rehabilitation. Will admit to inpatient rehab today.  Preadmission Screen Completed  By:  Cleatrice Burke, 01/14/2014 9:40 AM ______________________________________________________________________   Discussed status with Dr. Letta Pate on 01/14/14 at  [facility] and received telephone approval for admission today.  Admission Coordinator:  Cleatrice Burke, time 2025 Date 01/14/14.

## 2014-01-14 NOTE — Progress Notes (Signed)
Orthopedic Tech Progress Note Patient Details:  [name] 1929/07/29 [id number]  Patient ID: [name], female   DOB: [birth year], 80 y.o.   MRN: [id number] Brace order completed by Warnell Forester, Jesusa Stenerson 01/14/2014, 4:44 PM

## 2014-01-14 NOTE — Progress Notes (Addendum)
Per CIR, patient is being admitted there today. CSW is signing off at this time.  [name], MSW, [place]

## 2014-01-14 NOTE — Progress Notes (Addendum)
[facility]   Subjective  - Doing well no new complaints.   Objective 152/62 81 98.2 F (36.8 C) (Oral) 18 97%  Intake/Output Summary (Last 24 hours) at 01/14/14 0809 Last data filed at 01/14/14 0320  Gross per 24 hour  Intake    720 ml  Output   1350 ml  Net   -630 ml    Right stump clean and dry, no erythema Sin warm to touch  Assessment/Planning: POD # 3  Right AKA Severe protein malnutrition: mgmt per Nutrition Sacral decubitus Stage 2: mgmt per Wound care nursing Hypokalemia will continue to treat with potassium 40 meq BID will re-check bmet in am   Plan D/C to CIR CKD with improving cr  Laurence Slate [facility] 01/14/2014 8:09 AM --  Laboratory Lab Results:  Recent Labs  01/12/14 0407 01/13/14 0305  WBC 8.7 8.1  HGB 10.1* 9.8*  HCT 32.3* 31.6*  PLT 183 161   BMET  Recent Labs  01/12/14 0407 01/13/14 0305  NA 141 141  K 3.6* 2.8*  CL 98 99  CO2 22 23  GLUCOSE 72 89  BUN 115* 109*  CREATININE 3.33* 3.21*  CALCIUM 7.9* 8.0*    COAG Lab Results  Component Value Date   INR 1.43 10/20/2010   INR 1.96* 10/19/2010   INR 2.9 10/18/2010   PROTIME 25.3 05/30/2009   No results found for this basename: PTT   Addendum  I have independently interviewed and examined the patient, and I agree with the physician assistant's findings.  Ready to go to CIR.  Recheck K today  Adele Barthel, MD [facility] [place] Office: [phone] Pager: [phone]  01/14/2014, 8:39 AM

## 2014-01-14 NOTE — Progress Notes (Signed)
Pt arrived at 39 with family at bedside. Reviewed rehab process, booklet, and safety plan with verbal understanding. Call bell with in reach, SRx3, family at bedside.

## 2014-01-14 NOTE — Discharge Summary (Signed)
[facility] Discharge Summary   Patient ID:  [name] MRN: [id number] DOB/AGE: 78-Mar-1930 78 y.o.  Admit date: 01/11/2014 Discharge date: 01/14/2014 Date of Surgery: 01/11/2014 Surgeon: Surgeon(s): Conrad Spearfish, MD  Admission Diagnosis: Peripheral vascular disease with right leg critical limb ischemia  Discharge Diagnoses:  Peripheral vascular disease with right leg critical limb ischemia  Secondary Diagnoses: Past Medical History  Diagnosis Date  . Restrictive cardiomyopathy   . Atrial fibrillation 1996    S/P cardioversion  . CVA (cerebral infarction) 1999  . CPAP (continuous positive airway pressure) dependence   . Anemia     NOS  . Iron deficiency   . Gout   . Peptic ulcer, acute with hemorrhage 2005, 2011  . Hypertension   . Hypothyroidism   . Diastolic dysfunction   . Adenomatous colon polyp   . GERD (gastroesophageal reflux disease)   . Hiatal hernia   . Diverticulosis   . Anxiety disorder   . Arthritis   . Internal and external hemorrhoids without complication   . Morbid obesity   . CAD (coronary artery disease) 08/06/2008    One vessel by cath, EF overall preserved by echo 04/30/2008  . On home oxygen therapy   . Renal failure     Dr. Hassell Done  . Hyperlipidemia   . CHF (congestive heart failure)   . Complication of anesthesia     pt states she was sore "all-over" for over a week after anesthesia  . COAD (chronic obstructive airways disease) 2007    Exacerbation  . COPD (chronic obstructive pulmonary disease)     oxygen prn  . Sleep apnea     cpap - no longer uses  . Stroke     x2 -- effected vision; left sided slightly weak  . Rash     on upper extremities  . Bowel trouble     having constipation and diarrhea  . Family history of anesthesia complication     daughter had same "sore all over" experience with anesthesia    Procedure(s): AMPUTATION ABOVE KNEE-RIGHT  Discharged Condition: good  HPI: 80 y/o female with right  lower extremity critical limb ischemia underwent right lower extremity above knee amputation on 01/11/2014.  Her pain was well controlled without any complications.  Medical history: Sacral decubitus Stage 2: mgmt per Wound care nursing  CAD: no sx, stable H/H  Severe protein malnutrition: mgmt per Nutrition  Chronic anemia associated with iron deficiency and chronic kidney disease stage IV: pt is due to her Epo shot, will give Darbopoeitin today.  Hypokalemia Stable      Hospital Course:  [name] is a 80 y.o. female is S/P Right Procedure(s): AMPUTATION ABOVE KNEE-RIGHT Extubated: POD # 0 Physical exam: Right stump clean and dry, no erythema  Sin warm to touch Post-op wounds healing well Pt. Ambulating, voiding and taking PO diet without difficulty. Pt pain controlled with PO pain meds. Labs as below Complications:none  Consults:     Significant Diagnostic Studies: CBC Lab Results  Component Value Date   WBC 8.1 01/13/2014   HGB 9.8* 01/13/2014   HCT 31.6* 01/13/2014   MCV 103.6* 01/13/2014   PLT 161 01/13/2014    BMET    Component Value Date/Time   NA 141 01/13/2014 0305   K 2.8* 01/13/2014 0305   CL 99 01/13/2014 0305   CO2 23 01/13/2014 0305   GLUCOSE 89 01/13/2014 0305   BUN 109* 01/13/2014 0305   CREATININE 3.21* 01/13/2014  0305   CALCIUM 8.0* 01/13/2014 0305   CALCIUM 9.9 12/01/2013 1300   GFRNONAA 12* 01/13/2014 0305   GFRAA 14* 01/13/2014 0305   COAG Lab Results  Component Value Date   INR 1.43 10/20/2010   INR 1.96* 10/19/2010   INR 2.9 10/18/2010   PROTIME 25.3 05/30/2009     Disposition:  Discharge to :Rehab  Future Appointments Provider Department Dept Phone   01/29/2014 1:30 PM Conrad [place], MD [facility] [phone]       Medication List    ASK your doctor about these medications       acetaminophen 325 MG tablet  Commonly known as:  TYLENOL  Take 650 mg by mouth every 4 (four) hours as needed. For pain.      allopurinol 100 MG tablet  Commonly known as:  ZYLOPRIM  Take 100 mg by mouth every other day.     calcitRIOL 0.5 MCG capsule  Commonly known as:  ROCALTROL  Take 0.5 mcg by mouth every other day.     cephALEXin 500 MG capsule  Commonly known as:  KEFLEX  Take 1 capsule (500 mg total) by mouth daily.     HYDROcodone-acetaminophen 5-325 MG per tablet  Commonly known as:  NORCO/VICODIN  Take 1 tablet by mouth every 6 (six) hours as needed.     hydrocortisone 25 MG suppository  Commonly known as:  ANUSOL-HC  Place 1 suppository (25 mg total) rectally at bedtime as needed. For hemorrhoids. Use for 10 days.     levothyroxine 75 MCG tablet  Commonly known as:  SYNTHROID, LEVOTHROID  Take 75 mcg by mouth daily before breakfast.     losartan 100 MG tablet  Commonly known as:  COZAAR  Take 50 mg by mouth daily.     metolazone 2.5 MG tablet  Commonly known as:  ZAROXOLYN  Take 2.5 mg by mouth every other day.     oxyCODONE 5 MG immediate release tablet  Commonly known as:  ROXICODONE  Take 1 tablet (5 mg total) by mouth every 4 (four) hours as needed for severe pain.     pantoprazole 40 MG tablet  Commonly known as:  PROTONIX  Take 40 mg by mouth daily.     potassium chloride 40 MEQ/15ML (20%) Liqd  TAKE 15ML BY MOUTH TWICE DAILY     PROCRIT 16109 UNIT/ML injection  Generic drug:  epoetin alfa  Inject 40,000 Units into the skin once a week.     torsemide 20 MG tablet  Commonly known as:  DEMADEX  Take 140 mg by mouth daily.     traMADol 50 MG tablet  Commonly known as:  ULTRAM  Take 25-50 mg by mouth every 8 (eight) hours as needed (for pain).     vitamin C 500 MG tablet  Commonly known as:  ASCORBIC ACID  Take 500 mg by mouth daily.       Verbal and written Discharge instructions given to the patient. Wound care per Discharge AVS  Follow up in 4 weeks from surgery for staple removal  Signed: Laurence Slate [facility] 01/14/2014, 8:44 AM  Addendum  I have  independently interviewed and examined the patient, and I agree with the physician assistant's discharge summary.  This patient had right leg wound in the setting of severe tibial disease.  She underwent right above-knee amputation, which appears to be viable.  She will be transferred to inpatient rehab today.  Staples will be removed from the right  AKA stump in 4 weeks.  Adele Barthel, MD [facility] [place] Office: [phone] Pager: [phone]  01/14/2014, 9:33 AM

## 2014-01-14 NOTE — Progress Notes (Signed)
Discharged from 2w26 to CIR. Belongings with patient and family. Report given to receiving RN. Call bell and family near.Cindee Salt

## 2014-01-14 NOTE — Progress Notes (Signed)
Report called to Barnett Applebaum, RN on 4w for transfer to CIR. Instructed to maintain IV and CIR will d/c in am.[name], [place] E

## 2014-01-14 NOTE — Progress Notes (Signed)
Critical K+ 2.7 called to Evorn Gong, [place]. May given 2nd dose of potassium at 6pm this evening.Cindee Salt

## 2014-01-14 NOTE — Progress Notes (Signed)
Orthopedic Tech Progress Note Patient Details:  [name] March 06, 1929 [id number] [employer] contacted for brace order Patient ID: [name], female   DOB: 06-10-1929, 80 y.o.   MRN: [id number]   [name] 01/14/2014, 10:52 AM

## 2014-01-14 NOTE — H&P (Signed)
Physical Medicine and Rehabilitation Admission H&P  No chief complaint on file.  :  Chief complaint: Right leg pain  HPI: FILIPPA YARBOUGH is a 80 y.o. right-handed female with history of atrial fibrillation status post cardioversion, restrictive cardiomyopathy, CVA 1999 with mild left-sided weakness and chronic renal insufficiency with baseline creatinine 3.48-3.76. Admitted on 01/11/2014 with right lower extremity pain as well as right medial leg ulcers with serous drainage. Patient underwent recent angiographic procedures for evaluation of right lower extremity pain showing severe proximal stenosis and poor runoff. Limb was not felt to be salvageable. Underwent right above-knee amputation 01/11/2014 per Dr. Bridgett Larsson. Postoperative pain management. Subcutaneous Lovenox added for DVT prophylaxis. Followup [employer]. Acute blood loss anemia 9.8 and monitored. Hypokalemia 2.8 with supplement added. Physical and occupational therapy evaluations completed with recommendations of physical medicine rehabilitation consult. Patient was admitted for comprehensive rehabilitation program   Pt c/o RLE pain , no clear cut phantom pain  ROS Review of Systems  Respiratory: Positive for shortness of breath.  Cardiovascular: Positive for palpitations and leg swelling.  Gastrointestinal: Positive for constipation.  GERD  Musculoskeletal: Positive for joint pain.  Psychiatric/Behavioral:  Anxiety  All other systems reviewed and are negative  Past Medical History   Diagnosis  Date   .  Restrictive cardiomyopathy    .  Atrial fibrillation  1996     S/P cardioversion   .  CVA (cerebral infarction)  1999   .  CPAP (continuous positive airway pressure) dependence    .  Anemia      NOS   .  Iron deficiency    .  Gout    .  Peptic ulcer, acute with hemorrhage  2005, 2011   .  Hypertension    .  Hypothyroidism    .  Diastolic dysfunction    .  Adenomatous colon polyp    .  GERD (gastroesophageal  reflux disease)    .  Hiatal hernia    .  Diverticulosis    .  Anxiety disorder    .  Arthritis    .  Internal and external hemorrhoids without complication    .  Morbid obesity    .  CAD (coronary artery disease)  08/06/2008     One vessel by cath, EF overall preserved by echo 04/30/2008   .  On home oxygen therapy    .  Renal failure      Dr. Hassell Done   .  Hyperlipidemia    .  CHF (congestive heart failure)    .  Complication of anesthesia      pt states she was sore "all-over" for over a week after anesthesia   .  COAD (chronic obstructive airways disease)  2007     Exacerbation   .  COPD (chronic obstructive pulmonary disease)      oxygen prn   .  Sleep apnea      cpap - no longer uses   .  Stroke      x2 -- effected vision; left sided slightly weak   .  Rash      on upper extremities   .  Bowel trouble      having constipation and diarrhea   .  Family history of anesthesia complication      daughter had same "sore all over" experience with anesthesia    Past Surgical History   Procedure  Laterality  Date   .  Appendectomy     .  Oophorectomy     .  Incision and drainage of cellulitis   50/3546     Umbilical abcess   .  Cardiac catheterization       Pulmonary HTN, non obstructive CAD   .  Mole removal       on leg   .  Vaginal hysterectomy     .  Colonoscopy     .  Bascilic vein transposition  Left      Left upper arm   .  Amputation  Right  01/11/2014     Procedure: AMPUTATION ABOVE KNEE-RIGHT; Surgeon: Conrad Burkes[place], MD; Location: [facility] OR; Service: Vascular; Laterality: Right;    Family History   Problem  Relation  Age of Onset   .  Breast cancer  Mother    .  Cancer  Mother      breast   .  Heart disease  Father      MI   .  Heart attack  Father    .  Breast cancer  Sister    .  Cancer  Sister      breast   .  Peripheral vascular disease  Sister    .  Heart disease  Brother      CABG 5 vessel   .  Rheum arthritis  Brother    .  Colon cancer  Neg Hx      Social History: reports that she has never smoked. She has never used smokeless tobacco. She reports that she does not drink alcohol or use illicit drugs.  Allergies:  Allergies   Allergen  Reactions   .  Aspirin  Other (See Comments)     Tendency to bleed easily    Medications Prior to Admission   Medication  Sig  Dispense  Refill   .  acetaminophen (TYLENOL) 325 MG tablet  Take 650 mg by mouth every 4 (four) hours as needed. For pain.     Marland Kitchen  allopurinol (ZYLOPRIM) 100 MG tablet  Take 100 mg by mouth every other day.     .  calcitRIOL (ROCALTROL) 0.5 MCG capsule  Take 0.5 mcg by mouth every other day.     Marland Kitchen  epoetin alfa (PROCRIT) 56812 UNIT/ML injection  Inject 40,000 Units into the skin once a week.     Marland Kitchen  HYDROcodone-acetaminophen (NORCO/VICODIN) 5-325 MG per tablet  Take 1 tablet by mouth every 6 (six) hours as needed.  15 tablet  0   .  hydrocortisone (ANUSOL-HC) 25 MG suppository  Place 1 suppository (25 mg total) rectally at bedtime as needed. For hemorrhoids. Use for 10 days.  12 suppository  1   .  levothyroxine (SYNTHROID, LEVOTHROID) 75 MCG tablet  Take 75 mcg by mouth daily before breakfast.     .  losartan (COZAAR) 100 MG tablet  Take 50 mg by mouth daily.     .  metolazone (ZAROXOLYN) 2.5 MG tablet  Take 2.5 mg by mouth every other day.     .  pantoprazole (PROTONIX) 40 MG tablet  Take 40 mg by mouth daily.     .  potassium chloride 40 MEQ/15ML (20%) LIQD  TAKE 15ML BY MOUTH TWICE DAILY  900 mL  3   .  torsemide (DEMADEX) 20 MG tablet  Take 140 mg by mouth daily.     .  traMADol (ULTRAM) 50 MG tablet  Take 25-50 mg by mouth every 8 (eight) hours as needed (for pain).     Marland Kitchen  vitamin C (ASCORBIC ACID) 500 MG tablet  Take 500 mg by mouth daily.     .  cephALEXin (KEFLEX) 500 MG capsule  Take 1 capsule (500 mg total) by mouth daily.  10 capsule  0   .  oxyCODONE (ROXICODONE) 5 MG immediate release tablet  Take 1 tablet (5 mg total) by mouth every 4 (four) hours as needed for  severe pain.  30 tablet  0    Home:  Home Living  Family/patient expects to be discharged to:: Private residence  Living Arrangements: Spouse/significant other  Available Help at Discharge: Family;Available 24 hours/day  Type of Home: House  Home Access: Ramped entrance  Home Layout: Two level;Able to live on main level with bedroom/bathroom  Alternate Level Stairs-Number of Steps: 2  Home Equipment: Walker - 4 wheels;Cane - single point;Bedside commode;Wheelchair - manual  Functional History:  Prior Function  Comments: fell 3 weeks ago--slipped off the bed  Functional Status:  Mobility:      ADL:  ADL  Eating/Feeding: Performed;Set up  Where Assessed - Eating/Feeding: Bed level  Grooming: Performed;Wash/dry hands;Wash/dry face;Teeth care;Set up  Where Assessed - Grooming: Supported sitting  Upper Body Bathing: Simulated;Set up  Where Assessed - Upper Body Bathing: Supported sitting  Lower Body Bathing: Simulated;Moderate assistance  Where Assessed - Lower Body Bathing: Supine, head of bed up  Upper Body Dressing: Simulated;Minimal assistance  Where Assessed - Upper Body Dressing: Supported sitting  Lower Body Dressing: Simulated;Maximal assistance  Where Assessed - Lower Body Dressing: Supine, head of bed up  Toilet Transfer: Other (comment) (unable to assess)  Transfers/Ambulation Related to ADLs: Pt unable to stand or ambulate at this time.  ADL Comments: Pt does well with adls at bed level and at side of bed. Pt very motivated and willing to try anything despite her pain.  Cognition:  Cognition  Overall Cognitive Status: Within Functional Limits for tasks assessed  Orientation Level: Oriented X4  Cognition  Arousal/Alertness: Awake/alert  Behavior During Therapy: WFL for tasks assessed/performed  Overall Cognitive Status: Within Functional Limits for tasks assessed  Physical Exam:  Blood pressure 108/43, pulse 76, temperature 98 F (36.7 C), temperature source  Oral, resp. rate 16, height '4\' 10"'  (1.473 m), weight 49.3 kg (108 lb 11 oz), SpO2 93.00%.  Physical Exam  Constitutional: She is oriented to person, place, and time.  Frail 80 year old female  HENT:  Head: Normocephalic.  Eyes: EOM are normal.  Neck: Normal range of motion. Neck supple. No thyromegaly present.  Cardiovascular: Normal rate and regular rhythm.  Respiratory: Effort normal and breath sounds normal. No respiratory distress.  GI: Soft. Bowel sounds are normal. She exhibits no distension.  Musculoskeletal: Left upper arm fistula  Left foot no pain to palpation , no pain with AROM Neurological: She is alert and oriented to person, place, and time.  Follow simple commands. UE's 5/5. LLE is 3+ HF, KE, 4- at ankle Skin:  High right AKA dressed and appropriately tender. Chronic vascular changes to the left leg.  Psychiatric: She has a normal mood and affect. Her behavior is normal  Results for orders placed during the hospital encounter of 01/11/14 (from the past 48 hour(s))   CBC Status: Abnormal    Collection Time    01/11/14 7:37 AM   Result  Value  Range    WBC  6.4  4.0 - 10.5 K/uL    RBC  3.40 (*)  3.87 - 5.11 MIL/uL    Hemoglobin  10.9 (*)  12.0 - 15.0 g/dL    HCT  34.8 (*)  36.0 - 46.0 %    MCV  102.4 (*)  78.0 - 100.0 fL    MCH  32.1  26.0 - 34.0 pg    MCHC  31.3  30.0 - 36.0 g/dL    RDW  17.6 (*)  11.5 - 15.5 %    Platelets  200  150 - 400 K/uL   BASIC METABOLIC PANEL Status: Abnormal    Collection Time    01/11/14 7:37 AM   Result  Value  Range    Sodium  142  137 - 147 mEq/L    Potassium  2.8 (*)  3.7 - 5.3 mEq/L    Comment:  CRITICAL RESULT CALLED TO, READ BACK BY AND VERIFIED WITH:     L PATTERSON,RN 0830 01/11/14 WBOND    Chloride  94 (*)  96 - 112 mEq/L    CO2  26  19 - 32 mEq/L    Glucose, Bld  82  70 - 99 mg/dL    BUN  125 (*)  6 - 23 mg/dL    Creatinine, Ser  3.76 (*)  0.50 - 1.10 mg/dL    Calcium  8.3 (*)  8.4 - 10.5 mg/dL    GFR calc non Af Amer   10 (*)  >90 mL/min    GFR calc Af Amer  12 (*)  >90 mL/min    Comment:  (NOTE)     The eGFR has been calculated using the CKD EPI equation.     This calculation has not been validated in all clinical situations.     eGFR's persistently <90 mL/min signify possible Chronic Kidney     Disease.   CBC Status: Abnormal    Collection Time    01/11/14 4:47 PM   Result  Value  Range    WBC  7.7  4.0 - 10.5 K/uL    RBC  3.16 (*)  3.87 - 5.11 MIL/uL    Hemoglobin  10.1 (*)  12.0 - 15.0 g/dL    HCT  32.2 (*)  36.0 - 46.0 %    MCV  101.9 (*)  78.0 - 100.0 fL    MCH  32.0  26.0 - 34.0 pg    MCHC  31.4  30.0 - 36.0 g/dL    RDW  17.4 (*)  11.5 - 15.5 %    Platelets  199  150 - 400 K/uL   CREATININE, SERUM Status: Abnormal    Collection Time    01/11/14 4:47 PM   Result  Value  Range    Creatinine, Ser  3.48 (*)  0.50 - 1.10 mg/dL    GFR calc non Af Amer  11 (*)  >90 mL/min    GFR calc Af Amer  13 (*)  >90 mL/min    Comment:  (NOTE)     The eGFR has been calculated using the CKD EPI equation.     This calculation has not been validated in all clinical situations.     eGFR's persistently <90 mL/min signify possible Chronic Kidney     Disease.   BASIC METABOLIC PANEL Status: Abnormal    Collection Time    01/11/14 7:08 PM   Result  Value  Range    Sodium  140  137 - 147 mEq/L    Potassium  3.4 (*)  3.7 - 5.3 mEq/L    Chloride  97  96 - 112 mEq/L  CO2  21  19 - 32 mEq/L    Glucose, Bld  102 (*)  70 - 99 mg/dL    BUN  114 (*)  6 - 23 mg/dL    Creatinine, Ser  3.39 (*)  0.50 - 1.10 mg/dL    Calcium  7.7 (*)  8.4 - 10.5 mg/dL    GFR calc non Af Amer  12 (*)  >90 mL/min    GFR calc Af Amer  14 (*)  >90 mL/min    Comment:  (NOTE)     The eGFR has been calculated using the CKD EPI equation.     This calculation has not been validated in all clinical situations.     eGFR's persistently <90 mL/min signify possible Chronic Kidney     Disease.   MRSA PCR SCREENING Status: None    Collection  Time    01/11/14 7:52 PM   Result  Value  Range    MRSA by PCR  NEGATIVE  NEGATIVE    Comment:      The GeneXpert MRSA Assay (FDA     approved for NASAL specimens     only), is one component of a     comprehensive MRSA colonization     surveillance program. It is not     intended to diagnose MRSA     infection nor to guide or     monitor treatment for     MRSA infections.   BASIC METABOLIC PANEL Status: Abnormal    Collection Time    01/12/14 4:07 AM   Result  Value  Range    Sodium  141  137 - 147 mEq/L    Potassium  3.6 (*)  3.7 - 5.3 mEq/L    Chloride  98  96 - 112 mEq/L    CO2  22  19 - 32 mEq/L    Glucose, Bld  72  70 - 99 mg/dL    BUN  115 (*)  6 - 23 mg/dL    Creatinine, Ser  3.33 (*)  0.50 - 1.10 mg/dL    Calcium  7.9 (*)  8.4 - 10.5 mg/dL    GFR calc non Af Amer  12 (*)  >90 mL/min    GFR calc Af Amer  14 (*)  >90 mL/min    Comment:  (NOTE)     The eGFR has been calculated using the CKD EPI equation.     This calculation has not been validated in all clinical situations.     eGFR's persistently <90 mL/min signify possible Chronic Kidney     Disease.   CBC Status: Abnormal    Collection Time    01/12/14 4:07 AM   Result  Value  Range    WBC  8.7  4.0 - 10.5 K/uL    RBC  3.13 (*)  3.87 - 5.11 MIL/uL    Hemoglobin  10.1 (*)  12.0 - 15.0 g/dL    HCT  32.3 (*)  36.0 - 46.0 %    MCV  103.2 (*)  78.0 - 100.0 fL    MCH  32.3  26.0 - 34.0 pg    MCHC  31.3  30.0 - 36.0 g/dL    RDW  17.7 (*)  11.5 - 15.5 %    Platelets  183  150 - 400 K/uL   BASIC METABOLIC PANEL Status: Abnormal    Collection Time    01/13/14 3:05 AM   Result  Value  Range  Sodium  141  137 - 147 mEq/L    Potassium  2.8 (*)  3.7 - 5.3 mEq/L    Comment:  CRITICAL RESULT CALLED TO, READ BACK BY AND VERIFIED WITH:     HECKER,K RN 01/13/2014 0426 JORDANS     REPEATED TO VERIFY    Chloride  99  96 - 112 mEq/L    CO2  23  19 - 32 mEq/L    Glucose, Bld  89  70 - 99 mg/dL    BUN  109 (*)  6 - 23  mg/dL    Creatinine, Ser  3.21 (*)  0.50 - 1.10 mg/dL    Calcium  8.0 (*)  8.4 - 10.5 mg/dL    GFR calc non Af Amer  12 (*)  >90 mL/min    GFR calc Af Amer  14 (*)  >90 mL/min    Comment:  (NOTE)     The eGFR has been calculated using the CKD EPI equation.     This calculation has not been validated in all clinical situations.     eGFR's persistently <90 mL/min signify possible Chronic Kidney     Disease.   CBC Status: Abnormal    Collection Time    01/13/14 3:05 AM   Result  Value  Range    WBC  8.1  4.0 - 10.5 K/uL    RBC  3.05 (*)  3.87 - 5.11 MIL/uL    Hemoglobin  9.8 (*)  12.0 - 15.0 g/dL    HCT  31.6 (*)  36.0 - 46.0 %    MCV  103.6 (*)  78.0 - 100.0 fL    MCH  32.1  26.0 - 34.0 pg    MCHC  31.0  30.0 - 36.0 g/dL    RDW  17.8 (*)  11.5 - 15.5 %    Platelets  161  150 - 400 K/uL    Dg Chest 2 View  01/11/2014 CLINICAL DATA: Cardiomyopathy, COPD EXAM: CHEST 2 VIEW COMPARISON: 07/03/2012 FINDINGS: Cardiac shadow remains enlarged. The lungs are well aerated bilaterally without focal infiltrate. A tiny right-sided pleural effusion is seen. No acute bony abnormality is noted. IMPRESSION: Tiny right-sided pleural effusion. Stable cardiomegaly. Electronically Signed By: Inez Catalina M.D. On: 01/11/2014 08:55   Post Admission Physician Evaluation:  1. Functional deficits secondary to R AKA secondary to PVD and Gangrene. 2. Patient is admitted to receive collaborative, interdisciplinary care between the physiatrist, rehab nursing staff, and therapy team. 3. Patient's level of medical complexity and substantial therapy needs in context of that medical necessity cannot be provided at a lesser intensity of care such as a SNF. 4. Patient has experienced substantial functional loss from his/her baseline which was documented above under the "Functional History" and "Functional Status" headings. Judging by the patient's diagnosis, physical exam, and functional history, the patient has potential for  functional progress which will result in measurable gains while on inpatient rehab. These gains will be of substantial and practical use upon discharge in facilitating mobility and self-care at the household level. 5. Physiatrist will provide 24 hour management of medical needs as well as oversight of the therapy plan/treatment and provide guidance as appropriate regarding the interaction of the two. 6. 24 hour rehab nursing will assist with bladder management, bowel management, safety, skin/wound care, disease management, medication administration, pain management and patient education and help integrate therapy concepts, techniques,education, etc. 7. PT will assess and treat for/with: pre gait, gait training, endurance , safety,  equipment, neuromuscular re education. Goals are: Sup level WC/Walker. 8. OT will assess and treat for/with: ADLs, Cognitive perceptual skills, Neuromuscular re education, safety, endurance, equipment. Goals are: Sup UE, min A Bathing. 9. SLP will assess and treat for/with: NA. Goals are: NA. 10. Case Management and Social Worker will assess and treat for psychological issues and discharge planning. 11. Team conference will be held weekly to assess progress toward goals and to determine barriers to discharge. 12. Patient will receive at least 3 hours of therapy per day at least 5 days per week. 13. ELOS: 7-10 days  14. Prognosis: good Medical Problem List and Plan:  1. Right AKA secondary to gangrenous changes 01/11/2014  2. DVT Prophylaxis/Anticoagulation: Subcutaneous Lovenox. Monitor platelet counts and any signs of bleeding  3. Pain Management: Oxycodone as needed. Monitor with increased mobility  4. Neuropsych: This patient is capable of making decisions on her own behalf.  5. Acute blood loss anemia. Followup CBC  6. Chronic renal insufficiency. Baseline creatinine 3.48-3.76. Followup chemistries. Patient has a left upper arm fistula  7. History of atrial  fibrillation with cardioversion. Cardiac rate control. No chest pain or shortness of breath  8. Hypertension. Cozaar 50 mg daily, Zaroxolyn 2.5 mg every other day, Demadex 140 mg daily. Monitor with increased mobility  9. Hypothyroidism. Synthroid  10. COPD. Oxygen as needed. Check oxygen saturations every shift  11. GERD. Protonix  01/13/2014

## 2014-01-14 NOTE — Care Management Note (Signed)
    Page 1 of 1   01/14/2014     4:02:33 PM   CARE MANAGEMENT NOTE 01/14/2014  Patient:  [name], [name]   Account Number:  [id number]  Date Initiated:  01/14/2014  Documentation initiated by:  Kierra Jezewski  Subjective/Objective Assessment:   PT S/P RT AKA ON 01/11/14.  PTA, PT RESIDED AT [facility].  SHE HAS SUPPORTIVE DAUGHTER.     Action/Plan:   CIR ACCEPTING FOR ADMISSION TODAY, 2/5.   Anticipated DC Date:  01/14/2014   Anticipated DC Plan:  IP REHAB FACILITY  In-house referral  Clinical Social Worker      DC Planning Services  CM consult      Choice offered to / List presented to:             Status of service:  Completed, signed off Medicare Important Message given?   (If response is "NO", the following Medicare IM given date fields will be blank) Date Medicare IM given:   Date Additional Medicare IM given:    Discharge Disposition:  IP REHAB FACILITY  Per UR Regulation:  Reviewed for med. necessity/level of care/duration of stay  If discussed at [place] of Stay Meetings, dates discussed:    Comments:

## 2014-01-14 NOTE — Progress Notes (Signed)
Inpt rehab bed is available today and pt and spouse are in agreement.I have spoken with Dr. Bridgett Larsson and I will arrange. RN CM and SW are aware. [phone]

## 2014-01-15 ENCOUNTER — Inpatient Hospital Stay (HOSPITAL_COMMUNITY): Payer: Medicare Other | Admitting: Occupational Therapy

## 2014-01-15 ENCOUNTER — Inpatient Hospital Stay (HOSPITAL_COMMUNITY): Payer: Medicare Other

## 2014-01-15 ENCOUNTER — Inpatient Hospital Stay (HOSPITAL_COMMUNITY): Payer: Medicare Other | Admitting: Physical Therapy

## 2014-01-15 DIAGNOSIS — S78119A Complete traumatic amputation at level between unspecified hip and knee, initial encounter: Secondary | ICD-10-CM

## 2014-01-15 DIAGNOSIS — I739 Peripheral vascular disease, unspecified: Secondary | ICD-10-CM

## 2014-01-15 DIAGNOSIS — L98499 Non-pressure chronic ulcer of skin of other sites with unspecified severity: Secondary | ICD-10-CM

## 2014-01-15 LAB — COMPREHENSIVE METABOLIC PANEL
ALT: 6 U/L (ref 0–35)
AST: 11 U/L (ref 0–37)
Albumin: 2 g/dL — ABNORMAL LOW (ref 3.5–5.2)
Alkaline Phosphatase: 65 U/L (ref 39–117)
BUN: 101 mg/dL — ABNORMAL HIGH (ref 6–23)
CALCIUM: 8.3 mg/dL — AB (ref 8.4–10.5)
CO2: 27 mEq/L (ref 19–32)
CREATININE: 2.93 mg/dL — AB (ref 0.50–1.10)
Chloride: 101 mEq/L (ref 96–112)
GFR calc Af Amer: 16 mL/min — ABNORMAL LOW (ref >90)
GFR calc non Af Amer: 14 mL/min — ABNORMAL LOW (ref >90)
GLUCOSE: 97 mg/dL (ref 70–99)
Potassium: 3.3 mEq/L — ABNORMAL LOW (ref 3.7–5.3)
SODIUM: 144 meq/L (ref 137–147)
TOTAL PROTEIN: 5.5 g/dL — AB (ref 6.0–8.3)
Total Bilirubin: 0.4 mg/dL (ref 0.3–1.2)

## 2014-01-15 LAB — CBC WITH DIFFERENTIAL/PLATELET
Basophils Absolute: 0 10*3/uL (ref 0.0–0.1)
Basophils Relative: 0 % (ref 0–1)
EOS ABS: 0.2 10*3/uL (ref 0.0–0.7)
EOS PCT: 2 % (ref 0–5)
HCT: 32.3 % — ABNORMAL LOW (ref 36.0–46.0)
Hemoglobin: 10 g/dL — ABNORMAL LOW (ref 12.0–15.0)
LYMPHS ABS: 0.6 10*3/uL — AB (ref 0.7–4.0)
Lymphocytes Relative: 7 % — ABNORMAL LOW (ref 12–46)
MCH: 32.2 pg (ref 26.0–34.0)
MCHC: 31 g/dL (ref 30.0–36.0)
MCV: 103.9 fL — ABNORMAL HIGH (ref 78.0–100.0)
MONO ABS: 0.9 10*3/uL (ref 0.1–1.0)
Monocytes Relative: 11 % (ref 3–12)
Neutro Abs: 6.7 10*3/uL (ref 1.7–7.7)
Neutrophils Relative %: 79 % — ABNORMAL HIGH (ref 43–77)
PLATELETS: 184 10*3/uL (ref 150–400)
RBC: 3.11 MIL/uL — AB (ref 3.87–5.11)
RDW: 17.5 % — ABNORMAL HIGH (ref 11.5–15.5)
WBC: 8.4 10*3/uL (ref 4.0–10.5)

## 2014-01-15 MED ORDER — BOOST / RESOURCE BREEZE PO LIQD
1.0000 | ORAL | Status: DC
  Administered 2014-01-16 – 2014-01-27 (×10): 1 via ORAL

## 2014-01-15 MED ORDER — POTASSIUM CHLORIDE CRYS ER 20 MEQ PO TBCR
20.0000 meq | EXTENDED_RELEASE_TABLET | Freq: Once | ORAL | Status: AC
  Administered 2014-01-15: 20 meq via ORAL
  Filled 2014-01-15: qty 1

## 2014-01-15 NOTE — Progress Notes (Addendum)
INITIAL NUTRITION ASSESSMENT  DOCUMENTATION CODES Per approved criteria  -Severe malnutrition in the context of chronic illness   INTERVENTION: 1.  Supplements; Resource Breeze po daily, each supplement provides 250 kcal and 9 grams of protein  NUTRITION DIAGNOSIS: Malnutrition related to chronic disease as evidenced by severe fat and muscle wasting.   Monitor:  1.  Food/Beverage; pt meeting >/=90% estimated needs with tolerance. 2.  Wt/wt change; monitor trends  Reason for Assessment: MST  80 y.o. female  Admitting Dx: deconditioning   ASSESSMENT: Pt admitted for elective right AKA for gangrene/ischemic limb.  Pt currently working with therapy at time of visit.  Reviewed chart, per previous RD assessment conducted (2/3), weight hx difficult to determine.  Highest weight was 180 lbs due to fluid.  Poor appetite PTA.  Pt does not like ensure but is willing to try Breeze.   PO intake has been 75-100% meals since admission to rehab.  Will continue Breeze supplements once daily for now.   RD notes pt with severe muscle wasting in face, upper body, and upper extremities, however increased girth and lower extremities WNL.  Unsure of wt s/p amputation. Will follow.  Nutrition Focused Physical Exam (from previous assessment (2/3)): Subcutaneous Fat:  Orbital Region: WNL Upper Arm Region: WNL Thoracic and Lumbar Region: severe wasting  Muscle:  Temple Region: WNL Clavicle Bone Region: severe wasting Clavicle and Acromion Bone Region: severe wasting Scapular Bone Region: severe wasting Dorsal Hand: severe wasting Patellar Region: WNL Anterior Thigh Region: WNL Posterior Calf Region: WNL  Edema: not present  Height: Ht Readings from Last 1 Encounters:  01/11/14 4\' 10"  (1.473 m)    Weight: Wt Readings from Last 1 Encounters:  01/12/14 108 lb 11 oz (49.3 kg)    Ideal Body Weight: 40.3 kg   % Ideal Body Weight: 122%  Wt Readings from Last 10 Encounters:  01/12/14 108  lb 11 oz (49.3 kg)  01/12/14 108 lb 11 oz (49.3 kg)  12/31/13 110 lb (49.896 kg)  12/31/13 110 lb (49.896 kg)  12/25/13 111 lb (50.349 kg)  12/09/13 111 lb (50.349 kg)  10/01/13 117 lb 12.8 oz (53.434 kg)  03/13/13 139 lb (63.05 kg)  02/09/13 140 lb (63.504 kg)  08/22/12 141 lb (63.957 kg)    Usual Body Weight: unknown  % Usual Body Weight: -  BMI:  23.7 - adjusted for amputation   Estimated Nutritional Needs: Kcal: 1300-1500 Protein: 50-60 grams Fluid: > 1.5 L/day  Skin:  Stage 2 pressure ulcer on coccyx Incision right thigh Elbow abrasion  Diet Order: Cardiac  EDUCATION NEEDS: -No education needs identified at this time   Intake/Output Summary (Last 24 hours) at 01/15/14 1334 Last data filed at 01/15/14 0900  Gross per 24 hour  Intake    240 ml  Output    400 ml  Net   -160 ml    Last BM: 2/3  Labs:   Recent Labs Lab 01/12/14 0407 01/13/14 0305 01/14/14 1030 01/14/14 1830 01/15/14 0520  NA 141 141  --   --  144  K 3.6* 2.8* 2.7*  --  3.3*  CL 98 99  --   --  101  CO2 22 23  --   --  27  BUN 115* 109*  --   --  101*  CREATININE 3.33* 3.21*  --  3.01* 2.93*  CALCIUM 7.9* 8.0*  --   --  8.3*  GLUCOSE 72 89  --   --  97  CBG (last 3)  No results found for this basename: GLUCAP,  in the last 72 hours  Scheduled Meds: . allopurinol  100 mg Oral Q48H  . [START ON 01/16/2014] calcitRIOL  0.5 mcg Oral Q48H  . [START ON 01/20/2014] darbepoetin  25 mcg Subcutaneous Q7 days  . docusate sodium  100 mg Oral Daily  . enoxaparin (LOVENOX) injection  30 mg Subcutaneous Q24H  . feeding supplement (RESOURCE BREEZE)  1 Container Oral TID BM  . levothyroxine  75 mcg Oral QAC breakfast  . losartan  50 mg Oral Daily  . metolazone  2.5 mg Oral Q48H  . pantoprazole  40 mg Oral Daily  . potassium chloride  40 mEq Oral BID  . torsemide  140 mg Oral Daily  . vitamin C  500 mg Oral Daily    Continuous Infusions:    Past Medical History  Diagnosis Date  .  Restrictive cardiomyopathy   . Atrial fibrillation 1996    S/P cardioversion  . CVA (cerebral infarction) 1999  . CPAP (continuous positive airway pressure) dependence   . Anemia     NOS  . Iron deficiency   . Gout   . Peptic ulcer, acute with hemorrhage 2005, 2011  . Hypertension   . Hypothyroidism   . Diastolic dysfunction   . Adenomatous colon polyp   . GERD (gastroesophageal reflux disease)   . Hiatal hernia   . Diverticulosis   . Anxiety disorder   . Arthritis   . Internal and external hemorrhoids without complication   . Morbid obesity   . CAD (coronary artery disease) 08/06/2008    One vessel by cath, EF overall preserved by echo 04/30/2008  . On home oxygen therapy   . Renal failure     Dr. Hassell Done  . Hyperlipidemia   . CHF (congestive heart failure)   . Complication of anesthesia     pt states she was sore "all-over" for over a week after anesthesia  . COAD (chronic obstructive airways disease) 2007    Exacerbation  . COPD (chronic obstructive pulmonary disease)     oxygen prn  . Sleep apnea     cpap - no longer uses  . Stroke     x2 -- effected vision; left sided slightly weak  . Rash     on upper extremities  . Bowel trouble     having constipation and diarrhea  . Family history of anesthesia complication     daughter had same "sore all over" experience with anesthesia    Past Surgical History  Procedure Laterality Date  . Appendectomy    . Oophorectomy    . Incision and drainage of cellulitis  30/8657    Umbilical abcess  . Cardiac catheterization      Pulmonary HTN, non obstructive CAD  . Mole removal      on leg  . Vaginal hysterectomy    . Colonoscopy    . Bascilic vein transposition Left     Left upper arm  . Amputation Right 01/11/2014    Procedure: AMPUTATION ABOVE KNEE-RIGHT;  Surgeon: Conrad Algodones, MD;  Location: [facility];  Service: Vascular;  Laterality: Right;    Brynda Greathouse, MS RD LDN Clinical Inpatient Dietitian Pager:  [phone] Weekend/After hours pager: [phone]

## 2014-01-15 NOTE — Progress Notes (Signed)
Social Work Assessment and Plan Social Work Assessment and Plan  Patient Details  Name: [name] MRN: [id number] Date of Birth: 1929-10-27  Today's Date: 01/15/2014  Problem List:  Patient Active Problem List   Diagnosis Date Noted  . S/P AKA (above knee amputation) 01/14/2014  . Protein-calorie malnutrition, severe 01/13/2014  . Atherosclerosis of artery of extremity with rest pain 01/11/2014  . Swelling of limb 12/25/2013  . Bilateral leg pain 12/25/2013  . Atherosclerosis of native arteries of the extremities with ulceration(440.23) 12/25/2013  . Venous stasis ulcer of right lower extremity 12/25/2013  . End stage renal disease 06/18/2012  . CONSTIPATION 02/13/2011  . NAUSEA AND VOMITING 11/27/2010  . Gastric ulcer, unspecified as acute or chronic, without mention of hemorrhage, perforation, or obstruction 09/25/2010  . Chronic kidney disease (CKD) stage G4/A2, severely decreased glomerular filtration rate (GFR) between 15-29 mL/min/1.73 square meter and albuminuria creatinine ratio between 30-299 mg/g 10/31/2009  . HYPERLIPIDEMIA-MIXED 07/22/2009  . CAROTID BRUITS, BILATERAL 05/25/2009  . HEMORRHOIDS, INTERNAL, WITH COMPLICATION 85/88/5027  . EXTERNAL HEMORRHOIDS, WITH COMPLICATION 74/11/8785  . ANEMIA-IRON DEFICIENCY 08/09/2008  . COLONIC POLYPS, ADENOMATOUS, HX OF 08/05/2008  . Blood in Stool 07/16/2008  . ATRIAL FIBRILLATION 07/11/2008  . CHF 01/16/2008  . GERD 01/16/2008  . OBSTRUCTIVE SLEEP APNEA 10/31/2007  . Hx of gout 06/19/2007  . Unspecified Anemia 06/19/2007  . HYPOTHYROIDISM 01/09/2007  . HYPERTENSION 01/09/2007  . CVA 01/09/2007  . PEPTIC ULCER, ACUTE, HEMORRHAGE, HX OF 01/09/2007   Past Medical History:  Past Medical History  Diagnosis Date  . Restrictive cardiomyopathy   . Atrial fibrillation 1996    S/P cardioversion  . CVA (cerebral infarction) 1999  . CPAP (continuous positive airway pressure) dependence   . Anemia     NOS  . Iron  deficiency   . Gout   . Peptic ulcer, acute with hemorrhage 2005, 2011  . Hypertension   . Hypothyroidism   . Diastolic dysfunction   . Adenomatous colon polyp   . GERD (gastroesophageal reflux disease)   . Hiatal hernia   . Diverticulosis   . Anxiety disorder   . Arthritis   . Internal and external hemorrhoids without complication   . Morbid obesity   . CAD (coronary artery disease) 08/06/2008    One vessel by cath, EF overall preserved by echo 04/30/2008  . On home oxygen therapy   . Renal failure     Dr. Hassell Done  . Hyperlipidemia   . CHF (congestive heart failure)   . Complication of anesthesia     pt states she was sore "all-over" for over a week after anesthesia  . COAD (chronic obstructive airways disease) 2007    Exacerbation  . COPD (chronic obstructive pulmonary disease)     oxygen prn  . Sleep apnea     cpap - no longer uses  . Stroke     x2 -- effected vision; left sided slightly weak  . Rash     on upper extremities  . Bowel trouble     having constipation and diarrhea  . Family history of anesthesia complication     daughter had same "sore all over" experience with anesthesia   Past Surgical History:  Past Surgical History  Procedure Laterality Date  . Appendectomy    . Oophorectomy    . Incision and drainage of cellulitis  76/7209    Umbilical abcess  . Cardiac catheterization      Pulmonary HTN, non obstructive CAD  . Mole  removal      on leg  . Vaginal hysterectomy    . Colonoscopy    . Bascilic vein transposition Left     Left upper arm  . Amputation Right 01/11/2014    Procedure: AMPUTATION ABOVE KNEE-RIGHT;  Surgeon: Conrad [place], MD;  Location: [facility];  Service: Vascular;  Laterality: Right;   Social History:  reports that she has never smoked. She has never used smokeless tobacco. She reports that she does not drink alcohol or use illicit drugs.  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  Pt is one to not complain and will do her best.  She reports less pain now than before her surgery.  Husband will be here every day and daugther comes at meal times to assist  Social History Preferred language: English Religion: Baptist Cultural Background: No issues Education: [employer] Read: Yes Write: Yes Employment Status: Retired Freight forwarder Issues: No issues Guardian/Conservator: None-according to MD pt is capable of making her own decisions, family very involved also   Abuse/Neglect Physical Abuse: Denies Verbal Abuse: Denies Sexual Abuse: Denies Exploitation of patient/patient's resources: Denies Self-Neglect: Denies  Emotional Status Pt's affect, behavior adn adjustment status: Pt is motivated to improve and get back home.  She feels now the surgery is over her body can heal and move forward.  She has always been one to work hard and will try whatever is asked of her.  Family confirms this and is amazed how much pain she has endured and  continues to be willing to do waht she needs. Recent Psychosocial Issues: Other medical issues  Pyschiatric History: History of anxiety issue-takes meds at times for and feels they are helpful.  Deferred depression screening due to pt feels doing ok and fmaily confirms this.  Will continue to monitor her coping while here.  Pt states; " The pain is better which helps me deal with it, it had to be done." Substance Abuse History: No issues  Patient /  Family Perceptions, Expectations & Goals Pt/Family understanding of illness & functional limitations: Pt and family have a good understanding of pt's condition and treamtent plan from this point forward.  Main issue is to get pt stronger and manage her pain.  All hope she can get to supervision levlel by dishcarge but will make arrangements if more care is needed.  Family is very committed to pt. Premorbid pt/family roles/activities: Wife, Mother, grandmother, retiree, [employer] member, etc Anticipated changes in roles/activities/participation: resume at discharge Pt/family expectations/goals: Pt states; " I hope to be able to transfer better, before I leave."  Daughter states: " I will do what is needed, my daughters will help also."  Husband states; " I will help but can't lift too much."  [employer]: Other (Comment) (Had in past) Premorbid Home Care/DME Agencies: Other (Comment) ([facility]-Home O2) Transportation available at discharge: family Resource referrals recommended: Support group (specify) (Amputee Support Group)  Discharge Planning Living Arrangements: Spouse/significant other Support Systems: Spouse/significant other;Children;Other relatives;Friends/neighbors;Church/faith community Type of Residence: Private residence Insurance Resources: Education officer, museum (specify) Sports administrator) Financial Resources: Social Security Financial Screen Referred: No Living Expenses:  Lives with family Money Management: Spouse;Patient Does the patient have any problems obtaining your medications?: No Home Management: Daughter and granddaughter's husband cooks some and drives Patient/Family Preliminary Plans: Return home with husband and daughter and granddaughter's to assist with her care.  Granddaughter's assisted with bathing and dressiong prior to admission.  Family was helping pt 10 days prior due to pain and lack of mobility from leg issues.  Family awaiting teams evaluations  regarding length of stay and goals for therapy. Social Work Anticipated Follow Up Needs: HH/OP;Support Group Expected length of stay: 10-14 days  Clinical Impression Pleasant patient who is willing to push through the pain to achieve the best outcome.  Supportive husband who is daily and daughter who are willing to do what they can for pt to do well here. Family is having a ramp built for pt and will make other adaptations if needed.  Aware of the team conference and will be here to participate in therapies when needed.  Provide support and work On discharge plan.  Elease Hashimoto 01/15/2014, 1:01 PM

## 2014-01-15 NOTE — Plan of Care (Signed)
Problem: RH PAIN MANAGEMENT Goal: RH STG PAIN MANAGED AT OR BELOW PT'S PAIN GOAL Outcome: Progressing Pain level<3     

## 2014-01-15 NOTE — Progress Notes (Signed)
Reviewed and in agreement with treatment provided.  

## 2014-01-15 NOTE — Progress Notes (Signed)
Subjective/Complaints: Had a good night. Bladder emptying. Some constipation. Pain controlled at rest.  A 12 point review of systems has been performed and if not noted above is otherwise negative.   Objective: Vital Signs: Blood pressure 126/59, pulse 92, temperature 97.4 F (36.3 C), temperature source Oral, resp. rate 16, SpO2 94.00%. No results found.  Recent Labs  01/14/14 1830 01/15/14 0520  WBC 8.2 8.4  HGB 10.3* 10.0*  HCT 33.4* 32.3*  PLT 183 184    Recent Labs  01/13/14 0305 01/14/14 1030 01/14/14 1830 01/15/14 0520  NA 141  --   --  144  K 2.8* 2.7*  --  3.3*  CL 99  --   --  101  GLUCOSE 89  --   --  97  BUN 109*  --   --  101*  CREATININE 3.21*  --  3.01* 2.93*  CALCIUM 8.0*  --   --  8.3*   CBG (last 3)  No results found for this basename: GLUCAP,  in the last 72 hours  Wt Readings from Last 3 Encounters:  01/12/14 49.3 kg (108 lb 11 oz)  01/12/14 49.3 kg (108 lb 11 oz)  12/31/13 49.896 kg (110 lb)    Physical Exam:  Constitutional: She is oriented to person, place, and time.  Frail appearing. HENT:  Head: Normocephalic.  Eyes: EOM are normal.  Neck: Normal range of motion. Neck supple. No thyromegaly present.  Cardiovascular: Normal rate and regular rhythm. No murmur Respiratory: Effort normal and breath sounds normal. No respiratory distress.  GI: Soft. Bowel sounds are normal. She exhibits no distension.  Musculoskeletal: Left upper arm fistula  Left foot no pain to palpation , no pain with AROM Neurological: She is alert and oriented to person, place, and time.  Follow simple commands. UE's 5/5. LLE is 3+ HF, KE, 4- at ankle Skin:  High right AKA incision well approximated, tender. Minimal drainage. Chronic vascular changes to the left leg.  Psychiatric: She has a normal mood and affect. Her behavior is normal. pleasant   Assessment/Plan: 1. Functional deficits secondary to Right AKA which require 3+ hours per day of  interdisciplinary therapy in a comprehensive inpatient rehab setting. Physiatrist is providing close team supervision and 24 hour management of active medical problems listed below. Physiatrist and rehab team continue to assess barriers to discharge/monitor patient progress toward functional and medical goals. FIM:                   Comprehension Comprehension Mode: Auditory Comprehension: 5-Understands complex 90% of the time/Cues < 10% of the time  Expression Expression Mode: Verbal Expression: 5-Expresses complex 90% of the time/cues < 10% of the time  Social Interaction Social Interaction: 6-Interacts appropriately with others with medication or extra time (anti-anxiety, antidepressant).  Problem Solving Problem Solving: 5-Solves complex 90% of the time/cues < 10% of the time  Memory Memory: 6-More than reasonable amt of time  Medical Problem List and Plan:  1. Right AKA secondary to gangrenous changes 01/11/2014   -stump sock for protection/hygiene 2. DVT Prophylaxis/Anticoagulation: Subcutaneous Lovenox.   3. Pain Management: Oxycodone as needed. Monitor with increased mobility 4. Neuropsych: This patient is capable of making decisions on her own behalf.  5. Acute blood loss anemia. Followup hgb 10.0 6. Chronic renal insufficiency. Baseline creatinine 3.48-3.76. CR now 2.9  -potassium replacement  7. History of atrial fibrillation with cardioversion. Cardiac rate control. No chest pain or shortness  of breath  8. Hypertension. Cozaar 50 mg daily, Zaroxolyn 2.5 mg every other day, Demadex 140 mg daily. Monitor with increased mobility  9. Hypothyroidism. Synthroid  10. COPD. Oxygen as needed. Check oxygen saturations every shift  11. GERD. Protonix   LOS (Days) 1 A FACE TO FACE EVALUATION WAS PERFORMED  Myleigh Amara T 01/15/2014 7:31 AM

## 2014-01-15 NOTE — Progress Notes (Signed)
Patient information reviewed and entered into eRehab system by Shadell Brenn, RN, CRRN, PPS Coordinator.  Information including medical coding and functional independence measure will be reviewed and updated through discharge.     Per nursing patient was given "Data Collection Information Summary for Patients in Inpatient Rehabilitation Facilities with attached "Privacy Act Statement-Health Care Records" upon admission.  

## 2014-01-15 NOTE — Progress Notes (Signed)
Social Work Patient ID: [name], female   DOB: Apr 25, 1929, 80 y.o.   MRN: [id number] Asked Pam-PA for O2 pm order since pt used 2L at nighttime at home prior to admission.  Luz-RN aware and will pass onto Next shift.  Daughter concerned pt did not have oxygen last night.  Aware of order written.

## 2014-01-15 NOTE — Evaluation (Signed)
Physical Therapy Assessment and Plan  Patient Details  Name: [name] MRN: [id number] Date of Birth: 1929-04-23  PT Diagnosis: Difficulty walking and Muscle weakness Rehab Potential: Good ELOS: 10-14 days   Today's Date: 01/15/2014 Time: 5300-5110 Time Calculation (min): 55 min  Problem List:  Patient Active Problem List   Diagnosis Date Noted  . S/P AKA (above knee amputation) 01/14/2014  . Protein-calorie malnutrition, severe 01/13/2014  . Atherosclerosis of artery of extremity with rest pain 01/11/2014  . Swelling of limb 12/25/2013  . Bilateral leg pain 12/25/2013  . Atherosclerosis of native arteries of the extremities with ulceration(440.23) 12/25/2013  . Venous stasis ulcer of right lower extremity 12/25/2013  . End stage renal disease 06/18/2012  . CONSTIPATION 02/13/2011  . NAUSEA AND VOMITING 11/27/2010  . Gastric ulcer, unspecified as acute or chronic, without mention of hemorrhage, perforation, or obstruction 09/25/2010  . Chronic kidney disease (CKD) stage G4/A2, severely decreased glomerular filtration rate (GFR) between 15-29 mL/min/1.73 square meter and albuminuria creatinine ratio between 30-299 mg/g 10/31/2009  . HYPERLIPIDEMIA-MIXED 07/22/2009  . CAROTID BRUITS, BILATERAL 05/25/2009  . HEMORRHOIDS, INTERNAL, WITH COMPLICATION 21/10/7355  . EXTERNAL HEMORRHOIDS, WITH COMPLICATION 70/14/1030  . ANEMIA-IRON DEFICIENCY 08/09/2008  . COLONIC POLYPS, ADENOMATOUS, HX OF 08/05/2008  . Blood in Stool 07/16/2008  . ATRIAL FIBRILLATION 07/11/2008  . CHF 01/16/2008  . GERD 01/16/2008  . OBSTRUCTIVE SLEEP APNEA 10/31/2007  . Hx of gout 06/19/2007  . Unspecified Anemia 06/19/2007  . HYPOTHYROIDISM 01/09/2007  . HYPERTENSION 01/09/2007  . CVA 01/09/2007  . PEPTIC ULCER, ACUTE, HEMORRHAGE, HX OF 01/09/2007    Past Medical History:  Past Medical History  Diagnosis Date  . Restrictive cardiomyopathy   . Atrial fibrillation 1996    S/P cardioversion  . CVA  (cerebral infarction) 1999  . CPAP (continuous positive airway pressure) dependence   . Anemia     NOS  . Iron deficiency   . Gout   . Peptic ulcer, acute with hemorrhage 2005, 2011  . Hypertension   . Hypothyroidism   . Diastolic dysfunction   . Adenomatous colon polyp   . GERD (gastroesophageal reflux disease)   . Hiatal hernia   . Diverticulosis   . Anxiety disorder   . Arthritis   . Internal and external hemorrhoids without complication   . Morbid obesity   . CAD (coronary artery disease) 08/06/2008    One vessel by cath, EF overall preserved by echo 04/30/2008  . On home oxygen therapy   . Renal failure     Dr. Hassell Done  . Hyperlipidemia   . CHF (congestive heart failure)   . Complication of anesthesia     pt states she was sore "all-over" for over a week after anesthesia  . COAD (chronic obstructive airways disease) 2007    Exacerbation  . COPD (chronic obstructive pulmonary disease)     oxygen prn  . Sleep apnea     cpap - no longer uses  . Stroke     x2 -- effected vision; left sided slightly weak  . Rash     on upper extremities  . Bowel trouble     having constipation and diarrhea  . Family history of anesthesia complication     daughter had same "sore all over" experience with anesthesia   Past Surgical History:  Past Surgical History  Procedure Laterality Date  . Appendectomy    . Oophorectomy    . Incision and drainage of cellulitis  13/1438    Umbilical  abcess  . Cardiac catheterization      Pulmonary HTN, non obstructive CAD  . Mole removal      on leg  . Vaginal hysterectomy    . Colonoscopy    . Bascilic vein transposition Left     Left upper arm  . Amputation Right 01/11/2014    Procedure: AMPUTATION ABOVE KNEE-RIGHT;  Surgeon: Conrad Kapaau, MD;  Location[facility] OR;  Service: Vascular;  Laterality: Right;    Assessment & Plan Clinical Impression: Patient is a 80 y.o. year old female with recent admission to the hospital on 01/11/2014 with right  lower extremity pain as well as right medial leg ulcers with serous drainage. Patient underwent recent angiographic procedures for evaluation of right lower extremity pain showing severe proximal stenosis and poor runoff. Limb was not felt to be salvageable. Underwent right above-knee amputation 01/11/2014 per Dr. Bridgett Larsson. .  Patient transferred to CIR on 01/14/2014 .   Patient currently requires max with mobility secondary to muscle weakness and decreased standing balance.  Prior to hospitalization, patient was modified independent  with mobility and lived with Spouse in a House home.  Home access is   (building ramp).  Patient will benefit from skilled PT intervention to maximize safe functional mobility, minimize fall risk and decrease caregiver burden for planned discharge home with 24 hour assist.  Anticipate patient will benefit from follow up [facility] at discharge.  PT - End of Session Activity Tolerance: Tolerates 30+ min activity with multiple rests Endurance Deficit: Yes PT Assessment Rehab Potential: Good PT Patient demonstrates impairments in the following area(s): Balance;Endurance;Motor;Pain;Safety;Sensory PT Transfers Functional Problem(s): Bed Mobility;Bed to Chair;Car;Furniture PT Locomotion Functional Problem(s): Wheelchair Mobility PT Plan PT Intensity: Minimum of 1-2 x/day ,45 to 90 minutes PT Frequency: 5 out of 7 days PT Duration Estimated Length of Stay: 10-14 days PT Treatment/Interventions: Ambulation/gait training;Discharge planning;Balance/vestibular training;Community reintegration;DME/adaptive equipment instruction;Pain management;Splinting/orthotics;UE/LE Strength taining/ROM;UE/LE Coordination activities;Patient/family education;Functional mobility training;Therapeutic Activities;Therapeutic Exercise;Wheelchair propulsion/positioning;Neuromuscular re-education;Stair training PT Transfers Anticipated Outcome(s): supervision PT Locomotion Anticipated Outcome(s): supervision PT  Recommendation Follow Up Recommendations: Home health PT;24 hour supervision/assistance Patient destination: Home Equipment Recommended: To be determined  Skilled Therapeutic Intervention Scoot transfers with max A, pt limited by pain and anxiety, requires manual facilitation for forward wt shifts, cues for UE and LE placement.  Sit to stand in parallel bars with min A, pt able to stand with B UE support x 2 minutes.  Sit to stand with RW multiple attempts with mod A to stand, able to stand 1 minute before fatigue.  Stand pivot transfer training with RW with max A for wt shifts and balance.  PT Evaluation Precautions/Restrictions Precautions Precautions: Fall Restrictions Weight Bearing Restrictions: Yes RLE Weight Bearing: Weight bearing as tolerated Pain Pain Assessment Pain Assessment: 0-10 Pain Score: 3  Pain Type: Surgical pain Pain Location: Leg Pain Orientation: Right Pain Descriptors / Indicators: Guarding Patients Stated Pain Goal: 0 Home Living/Prior Functioning Home Living Available Help at Discharge: Family;Available 24 hours/day Type of Home: House Home Access:  (building ramp) Home Layout: Able to live on main level with bedroom/bathroom Additional Comments: pt states her home is not w/c accessable but they are going to work on it, family is building ramp  Lives With: Spouse Prior Function Level of Independence: Needs assistance with ADLs;Requires assistive device for independence;Needs assistance with tranfers  Cognition Overall Cognitive Status: Within Functional Limits for tasks assessed Sensation Sensation Light Touch:  (R phantom sensations, hypersensitive L LE) Proprioception:  (phantom sensation on  R) Coordination Gross Motor Movements are Fluid and Coordinated: Yes Motor  Motor Motor - Skilled Clinical Observations: generalized weakness  Mobility Bed Mobility Bed Mobility: Rolling Left Rolling Left: 4: Min assist Rolling Left Details (indicate  cue type and reason): min A with bedrail Supine to Sit: 3: Mod assist Supine to Sit Details (indicate cue type and reason): assist for trunk anc wt shifting Transfers Sit to Stand: 2: Max assist Sit to Stand Details (indicate cue type and reason): max A in parallel bars Stand Pivot Transfers: 2: Max Psychologist, occupational Details (indicate cue type and reason): max A for wt shifts, cues for hand placement, pt fearful of pain and falling, uses RW Lateral/Scoot Transfers: 2: Max Engineer, building services Details (indicate cue type and reason): lift and lower assist, cues for hand placement, facilitation for wt shift Ship broker: Yes Wheelchair Assistance: 4: Min Lexicographer: Both upper extremities Wheelchair Parts Management: Needs assistance Distance: 30'  Trunk/Postural Assessment  Cervical Assessment Cervical Assessment: Within Functional Limits Thoracic Assessment Thoracic Assessment:  (slight curve to L) Lumbar Assessment Lumbar Assessment:  (posterior pelvic tilt)  Balance Balance Balance Assessed: Yes Dynamic Sitting Balance Sitting balance - Comments: posterior lean, requires B UE support Static Standing Balance Static Standing - Balance Support: Bilateral upper extremity supported Static Standing - Level of Assistance: 4: Min assist Extremity Assessment      RLE Assessment RLE Assessment:  (hypersensitive, limited by pain and fear of pain) LLE Assessment LLE Assessment:  (grossly 3/5)  FIM:  FIM - Bed/Chair Transfer Bed/Chair Transfer: 2: Chair or W/C > Bed: Max A (lift and lower assist);2: Bed > Chair or W/C: Max A (lift and lower assist) FIM - Locomotion: Wheelchair Distance: 30' Locomotion: Wheelchair: 1: Travels less than 50 ft with minimal assistance (Pt.>75%) FIM - Locomotion: Ambulation Locomotion: Ambulation:  (pt unable to perform on eval) FIM - Locomotion: Stairs Locomotion: Stairs:   (unable to attempt on eval)   Refer to Care Plan for Long Term Goals  Recommendations for other services: None  Discharge Criteria: Patient will be discharged from PT if patient refuses treatment 3 consecutive times without medical reason, if treatment goals not met, if there is a change in medical status, if patient makes no progress towards goals or if patient is discharged from hospital.  The above assessment, treatment plan, treatment alternatives and goals were discussed and mutually agreed upon: by patient and by family  Alim Cattell 01/15/2014, 11:40 AM

## 2014-01-15 NOTE — Care Management Note (Signed)
Inpatient Rehabilitation Center Individual Statement of Services  Patient Name:  [name]  Date:  01/15/2014  Welcome to the [facility].  Our goal is to provide you with an individualized program based on your diagnosis and situation, designed to meet your specific needs.  With this comprehensive rehabilitation program, you will be expected to participate in at least 3 hours of rehabilitation therapies Monday-Friday, with modified therapy programming on the weekends.  Your rehabilitation program will include the following services:  Physical Therapy (PT), Occupational Therapy (OT), 24 hour per day rehabilitation nursing, Case Management (Social Worker), Rehabilitation Medicine, Nutrition Services and Pharmacy Services  Weekly team conferences will be held on Tuesday to discuss your progress.  Your Social Worker will talk with you frequently to get your input and to update you on team discussions.  Team conferences with you and your family in attendance may also be held.  Expected length of stay: 10-14 days  Overall anticipated outcome: supervision w/c level  Depending on your progress and recovery, your program may change. Your Social Worker will coordinate services and will keep you informed of any changes. Your Social Worker's name and contact numbers are listed  below.  The following services may also be recommended but are not provided by the [facility]:    [place] will be made to provide these services after discharge if needed.  Arrangements include referral to agencies that provide these services.  Your insurance has been verified to be:  [employer] Your primary doctor is:  Dr Unice Cobble  Pertinent information will be shared with your doctor and your insurance company.  Social Worker:  Lennart Pall, [place] or ([phone]   Information  discussed with and copy given to patient by: Elease Hashimoto, 01/15/2014, 12:06 PM

## 2014-01-15 NOTE — Progress Notes (Signed)
Physical Therapy Session Note  Patient Details  Name: [name] MRN: [id number] Date of Birth: 1929-09-14  Today's Date: 01/15/2014 Time: 7342-8768 Time Calculation (min): 47 min  Short Term Goals: Week 1:  PT Short Term Goal 1 (Week 1): Pt will perform functional transfers with min A PT Short Term Goal 2 (Week 1): Pt will tolerate standing balance for functional task x 1 minute with min A  Skilled Therapeutic Interventions/Progress Updates:   Session consisted of pt performing bed mobility with Mod A- Max A (Max A required in order for pt to scoot towards the head of the bed when supine and to scoot when sitting at the EOB), performing a w/c to bed and bed to w/c stand pivot transfer with Max A, performing a mat to w/c squat pivot transfer with Max A, a w/c to mat scoot pivot transfer with Max A, performing scooting at the edge of the mat 2 x 3' with Min A and a stool being used by the pt due to her inability to touch her foot on the floor, and performing w/c propulsion x 10' with S in order to improve her functional independence. Pt was very agreeable to therapy but deficits in UE and LE strength and endurance were evident throughout the session.  Therapy Documentation Precautions:  Precautions Precautions: Fall Restrictions Weight Bearing Restrictions: Yes RLE Weight Bearing: Weight bearing as tolerated   Pain:  Pt c/o of a 4/10 pain in her RLE and pain when her LLE was being touched. Pt was repositioned in the bed at the end of the session in order to alleviate this pain     See FIM for current functional status  Therapy/Group: Individual Therapy  Melford Tullier 01/15/2014, 3:16 PM

## 2014-01-15 NOTE — Progress Notes (Signed)
Occupational Therapy Session Note  Patient Details  Name: [name] MRN: [id number] Date of Birth: October 23, 1929  Today's Date: 01/15/2014 Time: 1300-1330 Time Calculation (min): 30 min  Short Term Goals: Week 1:  OT Short Term Goal 1 (Week 1): Patient will complete upper body bathing with setup assist OT Short Term Goal 2 (Week 1): Patient will complete lower body bathing with mod assist OT Short Term Goal 3 (Week 1): Patient will complete dressing with mod assist OT Short Term Goal 4 (Week 1): Patient will complete transfer from w/c to [facility] and back with mod assist  Skilled Therapeutic Interventions/Progress Updates:  Skilled intervention focusing on bed mobility, pain management, EOB<>BSC transfer, toileting needs, LB dressing, and overall activity tolerance/endurance. At end of session, left patient supine in bed with all needed items within reach and daughter & husband present.   Precautions:  Precautions Precautions: Fall Restrictions Weight Bearing Restrictions: Yes RLE Weight Bearing: Weight bearing as tolerated  See FIM for current functional status  Therapy/Group: Individual Therapy  Camila Maita 01/15/2014, 3:13 PM

## 2014-01-15 NOTE — Evaluation (Signed)
Occupational Therapy Assessment and Plan  Patient Details  Name: [name] MRN: [id number] Date of Birth: February 22, 1929  OT Diagnosis: muscle weakness (generalized) Rehab Potential: Rehab Potential: Good ELOS: 10-15 days   Today's Date: 01/15/2014 Time: 1035-1140 Time Calculation (min): 65 min  Problem List:  Patient Active Problem List   Diagnosis Date Noted  . S/P AKA (above knee amputation) 01/14/2014  . Protein-calorie malnutrition, severe 01/13/2014  . Atherosclerosis of artery of extremity with rest pain 01/11/2014  . Swelling of limb 12/25/2013  . Bilateral leg pain 12/25/2013  . Atherosclerosis of native arteries of the extremities with ulceration(440.23) 12/25/2013  . Venous stasis ulcer of right lower extremity 12/25/2013  . End stage renal disease 06/18/2012  . CONSTIPATION 02/13/2011  . NAUSEA AND VOMITING 11/27/2010  . Gastric ulcer, unspecified as acute or chronic, without mention of hemorrhage, perforation, or obstruction 09/25/2010  . Chronic kidney disease (CKD) stage G4/A2, severely decreased glomerular filtration rate (GFR) between 15-29 mL/min/1.73 square meter and albuminuria creatinine ratio between 30-299 mg/g 10/31/2009  . HYPERLIPIDEMIA-MIXED 07/22/2009  . CAROTID BRUITS, BILATERAL 05/25/2009  . HEMORRHOIDS, INTERNAL, WITH COMPLICATION 62/26/3335  . EXTERNAL HEMORRHOIDS, WITH COMPLICATION 45/62/5638  . ANEMIA-IRON DEFICIENCY 08/09/2008  . COLONIC POLYPS, ADENOMATOUS, HX OF 08/05/2008  . Blood in Stool 07/16/2008  . ATRIAL FIBRILLATION 07/11/2008  . CHF 01/16/2008  . GERD 01/16/2008  . OBSTRUCTIVE SLEEP APNEA 10/31/2007  . Hx of gout 06/19/2007  . Unspecified Anemia 06/19/2007  . HYPOTHYROIDISM 01/09/2007  . HYPERTENSION 01/09/2007  . CVA 01/09/2007  . PEPTIC ULCER, ACUTE, HEMORRHAGE, HX OF 01/09/2007    Past Medical History:  Past Medical History  Diagnosis Date  . Restrictive cardiomyopathy   . Atrial fibrillation 1996    S/P  cardioversion  . CVA (cerebral infarction) 1999  . CPAP (continuous positive airway pressure) dependence   . Anemia     NOS  . Iron deficiency   . Gout   . Peptic ulcer, acute with hemorrhage 2005, 2011  . Hypertension   . Hypothyroidism   . Diastolic dysfunction   . Adenomatous colon polyp   . GERD (gastroesophageal reflux disease)   . Hiatal hernia   . Diverticulosis   . Anxiety disorder   . Arthritis   . Internal and external hemorrhoids without complication   . Morbid obesity   . CAD (coronary artery disease) 08/06/2008    One vessel by cath, EF overall preserved by echo 04/30/2008  . On home oxygen therapy   . Renal failure     Dr. Hassell Done  . Hyperlipidemia   . CHF (congestive heart failure)   . Complication of anesthesia     pt states she was sore "all-over" for over a week after anesthesia  . COAD (chronic obstructive airways disease) 2007    Exacerbation  . COPD (chronic obstructive pulmonary disease)     oxygen prn  . Sleep apnea     cpap - no longer uses  . Stroke     x2 -- effected vision; left sided slightly weak  . Rash     on upper extremities  . Bowel trouble     having constipation and diarrhea  . Family history of anesthesia complication     daughter had same "sore all over" experience with anesthesia   Past Surgical History:  Past Surgical History  Procedure Laterality Date  . Appendectomy    . Oophorectomy    . Incision and drainage of cellulitis  [id number]/7342    Umbilical  abcess  . Cardiac catheterization      Pulmonary HTN, non obstructive CAD  . Mole removal      on leg  . Vaginal hysterectomy    . Colonoscopy    . Bascilic vein transposition Left     Left upper arm  . Amputation Right 01/11/2014    Procedure: AMPUTATION ABOVE KNEE-RIGHT;  Surgeon: Conrad [place] Gate, MD;  Location: [facility] OR;  Service: Vascular;  Laterality: Right;    Assessment & Plan Clinical Impression: Patient is a 80 y.o. year old female with history of atrial fibrillation status  post cardioversion, restrictive cardiomyopathy, CVA 1999 with mild left-sided weakness and chronic renal insufficiency with baseline creatinine 3.48-3.76.  Admitted on 01/11/2014 with right lower extremity pain as well as right medial leg ulcers with serous drainage. Patient underwent recent angiographic procedures for evaluation of right lower extremity pain showing severe proximal stenosis and poor runoff. Limb was not felt to be salvageable. Underwent right above-knee amputation 01/11/2014 per Dr. Bridgett Larsson. Postoperative pain management. Subcutaneous Lovenox added for DVT prophylaxis.   Patient transferred to CIR on 01/14/2014 .    Patient currently requires max assist with basic self-care skills secondary to muscle weakness and pain.  Prior to hospitalization, patient could complete BADL with min assist from family members (daughter and granddaughters) .  Patient will benefit from skilled intervention to decrease level of assist with basic self-care skills prior to discharge home with care partner.  Anticipate patient will require minimal physical assistance and follow up home health.  OT - End of Session Activity Tolerance: Tolerates 10 - 20 min activity with multiple rests Endurance Deficit: Yes OT Assessment Rehab Potential: Good Barriers to Discharge: Inaccessible home environment OT Patient demonstrates impairments in the following area(s): Endurance;Pain;Safety;Balance OT Basic ADL's Functional Problem(s): Bathing;Dressing;Toileting OT Transfers Functional Problem(s): Other (comment) (BSC) OT Plan OT Intensity: Minimum of 1-2 x/day, 45 to 90 minutes OT Frequency: 5 out of 7 days OT Duration/Estimated Length of Stay: 10-15 days OT Treatment/Interventions: Patient/family education;Pain management;Self Care/advanced ADL retraining;Therapeutic Activities;Therapeutic Exercise;UE/LE Strength taining/ROM;DME/adaptive equipment instruction;Balance/vestibular training;Discharge planning;Wheelchair  propulsion/positioning OT Self Feeding Anticipated Outcome(s): Mod I OT Basic Self-Care Anticipated Outcome(s): Min A OT Toileting Anticipated Outcome(s): Min A OT Bathroom Transfers Anticipated Outcome(s): Min A OT Recommendation Patient destination: Home Follow Up Recommendations: Home health OT   Skilled Therapeutic Intervention 1:1 OT initial evaluation completed with intervention provided to focus on improved pain tolerance, functional transfers, bed mobility, adapted bathing and dressing skills, and family education.   Patient was able to participate in bathing and dressing, sitting at edge of bed, but required significant physical assist due to increased pain provoked by activity.   Husband and daughter present during session and provided insights relating to patient's level of assist required for BADL prior to admission.  OT Evaluation Precautions/Restrictions  Precautions Precautions: Fall Restrictions Weight Bearing Restrictions: Yes RLE Weight Bearing: Weight bearing as tolerated  General Chart Reviewed: Yes Family/Caregiver Present: Yes  Pain Pain Assessment Pain Assessment: 0-10 Pain Score: 3  Pain Type: Surgical pain Pain Location: Leg Pain Orientation: Right Pain Descriptors / Indicators: Guarding Patients Stated Pain Goal: 0  Home Living/Prior Functioning Home Living Available Help at Discharge: Family;Available 24 hours/day Type of Home: House Home Access: Stairs to enter [employer] of Steps: 5 at front, 10 at rear Entrance Stairs-Rails: Right;Left Home Layout: Able to live on main level with bedroom/bathroom Alternate Level Stairs-Number of Steps: 2 Additional Comments: pt states her home is not w/c accessable but  they are going to work on it, family is building ramp  Lives With: Spouse IADL History Homemaking Responsibilities: No Current License: No Occupation: Retired Prior Function Level of Independence: Needs assistance with  ADLs;Requires assistive device for independence;Needs assistance with tranfers Bath: Minimal Dressing: Moderate  Able to Take Stairs?: No Driving: No Leisure: Hobbies-no Comments: fell 3 weeks ago--slipped off the bed  ADL ADL ADL Comments: see FIM  Vision/Perception  Vision - History Baseline Vision: Wears glasses only for reading Patient Visual Report: No change from baseline Vision - Assessment Eye Alignment: Within Functional Limits Perception Perception: Within Functional Limits Praxis Praxis: Intact   Cognition Overall Cognitive Status: Within Functional Limits for tasks assessed Arousal/Alertness: Awake/alert Orientation Level: Oriented X4 Attention: Sustained Sustained Attention: Appears intact Memory: Appears intact Awareness: Appears intact Problem Solving: Impaired Problem Solving Impairment: Functional complex Safety/Judgment: Appears intact  Sensation Sensation Light Touch: Appears Intact Stereognosis: Appears Intact Hot/Cold: Appears Intact Proprioception: Appears Intact Additional Comments: while performing seated ADL Coordination Gross Motor Movements are Fluid and Coordinated: Yes Fine Motor Movements are Fluid and Coordinated: Yes  Motor  Motor Motor - Skilled Clinical Observations: generalized weakness  Mobility  Bed Mobility Bed Mobility: Rolling Right;Rolling Left Rolling Right: 4: Min assist Rolling Right Details: Manual facilitation for placement;Verbal cues for sequencing Rolling Left: 4: Min assist Rolling Left Details: Manual facilitation for placement;Verbal cues for sequencing Rolling Left Details (indicate cue type and reason): with bedrails Supine to Sit: 3: Mod assist Supine to Sit Details: Manual facilitation for weight shifting Supine to Sit Details (indicate cue type and reason): assist for trunk anc wt shifting Transfers Transfers: Sit to Stand;Stand to Sit Sit to Stand: 3: Mod assist Sit to Stand Details: Manual  facilitation for weight bearing;Verbal cues for safe use of DME/AE Sit to Stand Details (indicate cue type and reason): max A in parallel bars Stand to Sit: 3: Mod assist Stand to Sit Details (indicate cue type and reason): Manual facilitation for weight bearing;Verbal cues for technique;Visual cues for safe use of DME/AE   Trunk/Postural Assessment  Cervical Assessment Cervical Assessment: Within Functional Limits Thoracic Assessment Thoracic Assessment: Within Functional Limits (moderate kyphosis) Lumbar Assessment Lumbar Assessment: Within Functional Limits Postural Control Postural Control: Within Functional Limits   Balance Balance Balance Assessed: Yes Dynamic Sitting Balance Sitting balance - Comments: posterior lean, requires B UE support Static Standing Balance Static Standing - Balance Support: Bilateral upper extremity supported Static Standing - Level of Assistance: 4: Min assist  Extremity/Trunk Assessment RUE Assessment RUE Assessment: Within Functional Limits LUE Assessment LUE Assessment: Within Functional Limits  FIM:  FIM - Bed/Chair Transfer Bed/Chair Transfer: 2: Chair or W/C > Bed: Max A (lift and lower assist);2: Bed > Chair or W/C: Max A (lift and lower assist)   Refer to Care Plan for Long Term Goals  Recommendations for other services: None  Discharge Criteria: Patient will be discharged from OT if patient refuses treatment 3 consecutive times without medical reason, if treatment goals not met, if there is a change in medical status, if patient makes no progress towards goals or if patient is discharged from hospital.  The above assessment, treatment plan, treatment alternatives and goals were discussed and mutually agreed upon: by patient and by family  [place] 01/15/2014, 12:08 PM

## 2014-01-16 ENCOUNTER — Inpatient Hospital Stay (HOSPITAL_COMMUNITY): Payer: Medicare Other | Admitting: *Deleted

## 2014-01-16 ENCOUNTER — Inpatient Hospital Stay (HOSPITAL_COMMUNITY): Payer: Medicare Other

## 2014-01-16 DIAGNOSIS — I4891 Unspecified atrial fibrillation: Secondary | ICD-10-CM

## 2014-01-16 DIAGNOSIS — D649 Anemia, unspecified: Secondary | ICD-10-CM

## 2014-01-16 DIAGNOSIS — N184 Chronic kidney disease, stage 4 (severe): Secondary | ICD-10-CM

## 2014-01-16 DIAGNOSIS — S78119A Complete traumatic amputation at level between unspecified hip and knee, initial encounter: Secondary | ICD-10-CM

## 2014-01-16 NOTE — Progress Notes (Signed)
Occupational Therapy Note  Patient Details  Name: [name] MRN: [id number] Date of Birth: May 28, 1929 Today's Date: 01/16/2014    Time:1300-1400  1st session Pain:  3/10  Right leg Individual session   1st session:  Engaged in sit to stand in gym with paralellel bars.  Did wc mobility down to gym with supervision.  Did 3 sit to stand with standing balance (min).  Stood for 30 seconds each time.  Pt did knee bends x 5.  Went to room.  Transferred to toilet with grab bars with max assist with stand pivot transfer and total assist with clothes and cleaning. Transferred back to wc and left in wc with call bell at side.  Husband present.    2nd session Time:1615-1710  (55 min) Pain: 3/10 right leg Individual session     2nd session:  Engage in wc mobility, transfers, UE AROM, endurance, and strengthening.  Pt. Transferred from wc to mat with max assist.  Did block push ups, 2 # weight with focus on triceps, transfers to mat and bed.  Pt performed exercises with crepitus in shoulders due to arthritis.  Pt states there is very little pain in her shoulders with the popping.  Transferred to wc and to bed with stand pivot.  Husband, Alveta Heimlich present during session.  Left pt in bed with call bell in hand.    Lisa Roca 01/16/2014, 2:02 PM

## 2014-01-16 NOTE — Progress Notes (Addendum)
Physical Therapy Session Note  Patient Details  Name: [name] MRN: [id number] Date of Birth: [birth year]  Today's Date: 01/16/2014 Time: 0800-0900 and 1450-1535 Time Calculation (min): 60 min and 45 min  Short Term Goals: Week 1:  PT Short Term Goal 1 (Week 1): Pt will perform functional transfers with min A PT Short Term Goal 2 (Week 1): Pt will tolerate standing balance for functional task x 1 minute with min A  Skilled Therapeutic Interventions/Progress Updates:  Pt supine in bed upon PT entering room and agreeable to therapy. Pt reports pain at a 6/10 but did receive pain meds prior to session. Pt's husband also present. Pt reports needing to use the bathroom prior to getting OOB. Pt request to use the bedroom and refused to get up to [facility], stating "It's coming to fast." Pt assisted in rolling to the left with min A and cues for hand placement and placed onto bed pan for toileting. Pt noted to have already begun BM upon rolling over.  Pt completed small BM and urination on bed pan, assisted with hygiene and removed from bed pan with rolling. Pt completed log rolling technique to sit EOB, requiring mod A and cues. Pt assisted with dressing sitting EOB. Pt able to maintain balance without UE support while dressing. Pt assisted into standing to pull pants up, requiring max A to stand with RW. Pt required max A to pull pants up. Pt with noted DOE, oxygen sats 94% on RA, HR: 85 BPM. Pt returned to sitting for rest break prior to getting into w/c. Pt completed squat pivot tsf with max A and verba/tactile cues for hand placement. Pt completed w/c mobility in hallway: 25 feet x1 and 45 feet x1 at supervision level with cues for safety and negotiating obstacles. Pt used bilateral upper extremities and the LLE to propel w/c. Pt does fatigue quickly requiring rest breaks intermittently throughout session.   Treatment 2: Pt seated in w/c upon PT entering room with husband present. Pt agreeable to  therapy and reports, "I did good standing with the other lady." Pt instructed in transfer training in parallel bars with cues for weight shifting to the left and forward. Pt requires max A to complete sit to stand transfers x3 in parallel bars. Pt completed there ex of the RLE in standing including hip abd, hip flexion and hip ext x10 each direction. Pt returned to sitting and completed there ex of the LLE, including heel raises, toe raises, LAQ, marching, hip abd and hamstring curls. 1# added to the LLE for added resistance and orange theraband for hip abd and hamstring curls. Pt required x2 rest breaks during there ex secondary to fatigue and SHOB. O2 sats remained WFL at 96% and above on RA. Pt returned to room, requiring max A for sit to stand tsf, mod A for stand pivot with RW to the bed and mod A for stand to sit with cues. Pt required mod A for sit to supine transfer. CNA present and assisted with scooted towards [place] (x2 assist required total A). Pt reported 10/10 pain of the R residual limb at end of session. Nsg notified, but pt unable to have medication at this time.   Therapy Documentation Precautions:  Precautions Precautions: Fall Restrictions Weight Bearing Restrictions: Yes RLE Weight Bearing: Non weight bearing See FIM for current functional status  Therapy/Group: Individual Therapy  Mckinley Olheiser R 01/16/2014, 10:41 AM

## 2014-01-16 NOTE — Progress Notes (Addendum)
[name] is a 80 y.o. female August 29, 1929 [id number]  Subjective: No new complaints. No new problems. Slept well. Feeling OK.  Objective: Vital signs in last 24 hours: Temp:  [97.6 F (36.4 C)-97.7 F (36.5 C)] 97.6 F (36.4 C) (02/07 0611) Pulse Rate:  [77-88] 88 (02/07 0611) Resp:  [16-17] 16 (02/07 0611) BP: (98-121)/(50-51) 121/51 mmHg (02/07 0611) SpO2:  [93 %-96 %] 96 % (02/07 9518) Weight change:  Last BM Date: 01/12/14  Intake/Output from previous day: 02/06 0701 - 02/07 0700 In: 720 [P.O.:720] Out: -  Last cbgs: CBG (last 3)  No results found for this basename: GLUCAP,  in the last 72 hours   Physical Exam General: No apparent distress   Lungs: Normal effort. Lungs clear to auscultation, no crackles or wheezes. Cardiovascular: irregular rate and rhythm, no edema Abdomen: S/NT/ND; BS(+) Musculoskeletal:  unchanged Neurological: No new neurological deficits Wounds: s/p   R AKA Skin: clear  Aging changes Mental state: Alert, oriented, cooperative    Lab Results: BMET    Component Value Date/Time   NA 144 01/15/2014 0520   K 3.3* 01/15/2014 0520   CL 101 01/15/2014 0520   CO2 27 01/15/2014 0520   GLUCOSE 97 01/15/2014 0520   BUN 101* 01/15/2014 0520   CREATININE 2.93* 01/15/2014 0520   CALCIUM 8.3* 01/15/2014 0520   CALCIUM 9.9 12/01/2013 1300   GFRNONAA 14* 01/15/2014 0520   GFRAA 16* 01/15/2014 0520   CBC    Component Value Date/Time   WBC 8.4 01/15/2014 0520   RBC 3.11* 01/15/2014 0520   HGB 10.0* 01/15/2014 0520   HCT 32.3* 01/15/2014 0520   PLT 184 01/15/2014 0520   MCV 103.9* 01/15/2014 0520   MCH 32.2 01/15/2014 0520   MCHC 31.0 01/15/2014 0520   RDW 17.5* 01/15/2014 0520   LYMPHSABS 0.6* 01/15/2014 0520   MONOABS 0.9 01/15/2014 0520   EOSABS 0.2 01/15/2014 0520   BASOSABS 0.0 01/15/2014 0520    Studies/Results: No results found.  Medications: I have reviewed the patient's current medications.  Assessment/Plan:  1. Right AKA secondary to gangrenous changes  01/11/2014  -stump sock for protection/hygiene  2. DVT Prophylaxis/Anticoagulation: Subcutaneous Lovenox.  3. Pain Management: Oxycodone as needed. Monitor with increased mobility  4. Neuropsych: This patient is capable of making decisions on her own behalf.  5. Acute blood loss anemia. Followup hgb 10.0  6. Chronic renal insufficiency. Baseline creatinine 3.48-3.76. CR now 2.9  -potassium replacement  7. History of atrial fibrillation with cardioversion. Cardiac rate control. No chest pain or shortness of breath  8. Hypertension. Cozaar 50 mg daily, Zaroxolyn 2.5 mg every other day, Demadex 140 mg daily. Monitor with increased mobility  9. Hypothyroidism. Synthroid  10. COPD. Oxygen as needed. Check oxygen saturations every shift  11. GERD. Protonix   Cont Rx     Length of stay, days: 2  Walker Kehr , MD 01/16/2014, 10:00 AM

## 2014-01-17 DIAGNOSIS — K219 Gastro-esophageal reflux disease without esophagitis: Secondary | ICD-10-CM

## 2014-01-17 MED ORDER — POTASSIUM CHLORIDE 20 MEQ/15ML (10%) PO LIQD
40.0000 meq | Freq: Two times a day (BID) | ORAL | Status: DC
  Administered 2014-01-17 – 2014-01-19 (×5): 40 meq via ORAL
  Filled 2014-01-17 (×8): qty 30

## 2014-01-17 MED ORDER — SIMETHICONE 80 MG PO CHEW
80.0000 mg | CHEWABLE_TABLET | Freq: Four times a day (QID) | ORAL | Status: DC | PRN
  Administered 2014-01-17 – 2014-01-20 (×3): 80 mg via ORAL
  Filled 2014-01-17 (×4): qty 1

## 2014-01-17 NOTE — IPOC Note (Signed)
Overall Plan of Care [facility]) Patient Details Name: [name] MRN: [id number] DOB: 26-Nov-1929  Admitting Diagnosis: R AKA  Hospital Problems: Active Problems:   S/P AKA (above knee amputation)     Functional Problem List: Nursing Bowel;Endurance;Medication Management;Pain;Perception;Safety;Skin Integrity  PT Balance;Endurance;Motor;Pain;Safety;Sensory  OT Endurance;Pain;Safety;Balance  SLP    TR         Basic ADL's: OT Bathing;Dressing;Toileting     Advanced  ADL's: OT       Transfers: PT Bed Mobility;Bed to Chair;Car;Furniture  OT Other (comment) (BSC)     Locomotion: PT Wheelchair Mobility     Additional Impairments: OT    SLP        TR      Anticipated Outcomes Item Anticipated Outcome  Self Feeding Mod I  Swallowing      Basic self-care  Min A  Toileting  Min A   Bathroom Transfers Min A  Bowel/Bladder  remain cont. of b/b with BM q2days with minimal assist  Transfers  supervision  Locomotion  supervision  Communication     Cognition     Pain  4 or less out of 10  Safety/Judgment  minimal assist    Therapy Plan: PT Intensity: Minimum of 1-2 x/day ,45 to 90 minutes PT Frequency: 5 out of 7 days PT Duration Estimated Length of Stay: 10-14 days OT Intensity: Minimum of 1-2 x/day, 45 to 90 minutes OT Frequency: 5 out of 7 days OT Duration/Estimated Length of Stay: 10-15 days         Team Interventions: Nursing Interventions Patient/Family Education;Bowel Management;Disease Management/Prevention;Pain Management;Medication Management;Skin Care/Wound Management  PT interventions Ambulation/gait training;Discharge planning;Balance/vestibular training;Community reintegration;DME/adaptive equipment instruction;Pain management;Splinting/orthotics;UE/LE Strength taining/ROM;UE/LE Coordination activities;Patient/family education;Functional mobility training;Therapeutic Activities;Therapeutic Exercise;Wheelchair  propulsion/positioning;Neuromuscular re-education;Stair training  OT Interventions Patient/family education;Pain management;Self Care/advanced ADL retraining;Therapeutic Activities;Therapeutic Exercise;UE/LE Strength taining/ROM;DME/adaptive equipment instruction;Balance/vestibular training;Discharge planning;Wheelchair propulsion/positioning  SLP Interventions    TR Interventions    SW/CM Interventions Discharge Planning;Psychosocial Support;Patient/Family Education    Team Discharge Planning: Destination: PT-Home ,OT- Home , SLP-  Projected Follow-up: PT-Home health PT;24 hour supervision/assistance, OT-  Home health OT, SLP-  Projected Equipment Needs: PT-To be determined, OT-  , SLP-  Equipment Details: PT- , OT-  Patient/family involved in discharge planning: PT- Patient;Family member/caregiver,  OT-Patient;Family member/caregiver, SLP-   MD ELOS: 7-10 days Medical Rehab Prognosis:  Good Assessment: 80 y.o. right-handed female with history of atrial fibrillation status post cardioversion, restrictive cardiomyopathy, CVA 1999 with mild left-sided weakness and chronic renal insufficiency with baseline creatinine 3.48-3.76. Admitted on 01/11/2014 with right lower extremity pain as well as right medial leg ulcers with serous drainage. Patient underwent recent angiographic procedures for evaluation of right lower extremity pain showing severe proximal stenosis and poor runoff. Limb was not felt to be salvageable. Underwent right above-knee amputation 01/11/2014 per Dr. Bridgett Larsson. Postoperative pain management  Now requiring 24/7 Rehab RN,MD, as well as CIR level PT, OT and SLP.  Treatment team will focus on ADLs and mobility with goals set at [place]    See Team Conference Notes for weekly updates to the plan of care

## 2014-01-17 NOTE — Progress Notes (Signed)
[name] is a 80 y.o. female 1929/11/08 [id number]  Subjective: C/o gas this am; no abd pain. No new problems. Slept well. Feeling OK.  Objective: Vital signs in last 24 hours: Temp:  [97 F (36.1 C)-97.9 F (36.6 C)] 97 F (36.1 C) (02/08 0600) Pulse Rate:  [86-96] 86 (02/08 0600) Resp:  [16-19] 19 (02/08 0600) BP: (103-122)/(50-67) 103/50 mmHg (02/08 0600) SpO2:  [93 %-99 %] 99 % (02/08 0600) Weight change:  Last BM Date: 01/16/14  Intake/Output from previous day: 02/07 0701 - 02/08 0700 In: 720 [P.O.:720] Out: -  Last cbgs: CBG (last 3)  No results found for this basename: GLUCAP,  in the last 72 hours   Physical Exam General: No apparent distress   Lungs: Normal effort. Lungs clear to auscultation, no crackles or wheezes. Cardiovascular: irregular rate and rhythm, no edema Abdomen: S/NT/ND; BS(+) Musculoskeletal:  unchanged Neurological: No new neurological deficits Wounds: s/p   R AKA Skin: clear  Aging changes Mental state: Alert, oriented, cooperative    Lab Results: BMET    Component Value Date/Time   NA 144 01/15/2014 0520   K 3.3* 01/15/2014 0520   CL 101 01/15/2014 0520   CO2 27 01/15/2014 0520   GLUCOSE 97 01/15/2014 0520   BUN 101* 01/15/2014 0520   CREATININE 2.93* 01/15/2014 0520   CALCIUM 8.3* 01/15/2014 0520   CALCIUM 9.9 12/01/2013 1300   GFRNONAA 14* 01/15/2014 0520   GFRAA 16* 01/15/2014 0520   CBC    Component Value Date/Time   WBC 8.4 01/15/2014 0520   RBC 3.11* 01/15/2014 0520   HGB 10.0* 01/15/2014 0520   HCT 32.3* 01/15/2014 0520   PLT 184 01/15/2014 0520   MCV 103.9* 01/15/2014 0520   MCH 32.2 01/15/2014 0520   MCHC 31.0 01/15/2014 0520   RDW 17.5* 01/15/2014 0520   LYMPHSABS 0.6* 01/15/2014 0520   MONOABS 0.9 01/15/2014 0520   EOSABS 0.2 01/15/2014 0520   BASOSABS 0.0 01/15/2014 0520    Studies/Results: No results found.  Medications: I have reviewed the patient's current medications.  Assessment/Plan:  1. Right AKA secondary to gangrenous changes  01/11/2014  -stump sock for protection/hygiene  2. DVT Prophylaxis/Anticoagulation: Subcutaneous Lovenox.  3. Pain Management: Oxycodone as needed. Monitor with increased mobility  4. Neuropsych: This patient is capable of making decisions on her own behalf.  5. Acute blood loss anemia. Followup hgb 10.0  6. Chronic renal insufficiency. Baseline creatinine 3.48-3.76. CR now 2.9  -potassium replacement  7. History of atrial fibrillation with cardioversion. Cardiac rate control. No chest pain or shortness of breath  8. Hypertension. Cozaar 50 mg daily, Zaroxolyn 2.5 mg every other day, Demadex 140 mg daily. Monitor with increased mobility  9. Hypothyroidism. Synthroid  10. COPD. Oxygen as needed. Check oxygen saturations every shift  11. GERD. Protonix  12. Abd gas. Will use Simethicone   Cont Rx     Length of stay, days: 3  Walker Kehr , MD 01/17/2014, 8:53 AM

## 2014-01-17 NOTE — IPOC Note (Signed)
Overall Plan of Care [facility]) Patient Details Name: ELENOR WILDES MRN: [id number] DOB: 10/07/1929  Admitting Diagnosis: R AKA  Hospital Problems: Active Problems:   S/P AKA (above knee amputation)     Functional Problem List: Nursing Bowel;Endurance;Medication Management;Pain;Perception;Safety;Skin Integrity  PT Balance;Endurance;Motor;Pain;Safety;Sensory  OT Endurance;Pain;Safety;Balance  SLP    TR         Basic ADL's: OT Bathing;Dressing;Toileting     Advanced  ADL's: OT       Transfers: PT Bed Mobility;Bed to Chair;Car;Furniture  OT Other (comment) (BSC)     Locomotion: PT Wheelchair Mobility     Additional Impairments: OT    SLP        TR      Anticipated Outcomes Item Anticipated Outcome  Self Feeding Mod I  Swallowing      Basic self-care  Min A  Toileting  Min A   Bathroom Transfers Min A  Bowel/Bladder  remain cont. of b/b with BM q2days with minimal assist  Transfers  supervision  Locomotion  supervision  Communication     Cognition     Pain  4 or less out of 10  Safety/Judgment  minimal assist    Therapy Plan: PT Intensity: Minimum of 1-2 x/day ,45 to 90 minutes PT Frequency: 5 out of 7 days PT Duration Estimated Length of Stay: 10-14 days OT Intensity: Minimum of 1-2 x/day, 45 to 90 minutes OT Frequency: 5 out of 7 days OT Duration/Estimated Length of Stay: 10-15 days         Team Interventions: Nursing Interventions Patient/Family Education;Bowel Management;Disease Management/Prevention;Pain Management;Medication Management;Skin Care/Wound Management  PT interventions Ambulation/gait training;Discharge planning;Balance/vestibular training;Community reintegration;DME/adaptive equipment instruction;Pain management;Splinting/orthotics;UE/LE Strength taining/ROM;UE/LE Coordination activities;Patient/family education;Functional mobility training;Therapeutic Activities;Therapeutic Exercise;Wheelchair  propulsion/positioning;Neuromuscular re-education;Stair training  OT Interventions Patient/family education;Pain management;Self Care/advanced ADL retraining;Therapeutic Activities;Therapeutic Exercise;UE/LE Strength taining/ROM;DME/adaptive equipment instruction;Balance/vestibular training;Discharge planning;Wheelchair propulsion/positioning  SLP Interventions    TR Interventions    SW/CM Interventions Discharge Planning;Psychosocial Support;Patient/Family Education    Team Discharge Planning: Destination: PT-Home ,OT- Home , SLP-  Projected Follow-up: PT-Home health PT;24 hour supervision/assistance, OT-  Home health OT, SLP-  Projected Equipment Needs: PT-To be determined, OT-  , SLP-  Equipment Details: PT- , OT-  Patient/family involved in discharge planning: PT- Patient;Family member/caregiver,  OT-Patient;Family member/caregiver, SLP-   MD ELOS: 12 days Medical Rehab Prognosis:  Excellent Assessment: The patient has been admitted for CIR therapies. The team will be addressing, functional mobility, strength, stamina, balance, safety, adaptive techniques/equipment, self-care, bowel and bladder mgt, patient and caregiver education, pre-prosthetic education, pain mgt, egosupport,. Goals have been set at min assist for basic self-care tasks and supervision for transfers and locomotion.    Meredith Staggers, MD, FAAPMR      See Team Conference Notes for weekly updates to the plan of care

## 2014-01-18 ENCOUNTER — Encounter (HOSPITAL_COMMUNITY): Payer: Medicare Other | Admitting: Occupational Therapy

## 2014-01-18 ENCOUNTER — Inpatient Hospital Stay (HOSPITAL_COMMUNITY): Payer: Medicare Other | Admitting: Occupational Therapy

## 2014-01-18 ENCOUNTER — Inpatient Hospital Stay (HOSPITAL_COMMUNITY): Payer: Medicare Other

## 2014-01-18 ENCOUNTER — Other Ambulatory Visit: Payer: Self-pay | Admitting: Internal Medicine

## 2014-01-18 DIAGNOSIS — S78119A Complete traumatic amputation at level between unspecified hip and knee, initial encounter: Secondary | ICD-10-CM

## 2014-01-18 DIAGNOSIS — L98499 Non-pressure chronic ulcer of skin of other sites with unspecified severity: Secondary | ICD-10-CM

## 2014-01-18 DIAGNOSIS — I739 Peripheral vascular disease, unspecified: Secondary | ICD-10-CM

## 2014-01-18 LAB — BASIC METABOLIC PANEL
BUN: 98 mg/dL — ABNORMAL HIGH (ref 6–23)
CO2: 26 meq/L (ref 19–32)
Calcium: 8.9 mg/dL (ref 8.4–10.5)
Chloride: 102 mEq/L (ref 96–112)
Creatinine, Ser: 2.4 mg/dL — ABNORMAL HIGH (ref 0.50–1.10)
GFR calc non Af Amer: 18 mL/min — ABNORMAL LOW (ref >90)
GFR, EST AFRICAN AMERICAN: 21 mL/min — AB (ref >90)
Glucose, Bld: 93 mg/dL (ref 70–99)
POTASSIUM: 4.6 meq/L (ref 3.7–5.3)
SODIUM: 143 meq/L (ref 137–147)

## 2014-01-18 LAB — CBC
HCT: 31.5 % — ABNORMAL LOW (ref 36.0–46.0)
Hemoglobin: 9.8 g/dL — ABNORMAL LOW (ref 12.0–15.0)
MCH: 32.8 pg (ref 26.0–34.0)
MCHC: 31.1 g/dL (ref 30.0–36.0)
MCV: 105.4 fL — AB (ref 78.0–100.0)
Platelets: 205 10*3/uL (ref 150–400)
RBC: 2.99 MIL/uL — AB (ref 3.87–5.11)
RDW: 17 % — ABNORMAL HIGH (ref 11.5–15.5)
WBC: 6.3 10*3/uL (ref 4.0–10.5)

## 2014-01-18 MED ORDER — HYDROCORTISONE 2.5 % RE CREA
TOPICAL_CREAM | Freq: Three times a day (TID) | RECTAL | Status: DC
  Administered 2014-01-18 – 2014-01-25 (×20): via RECTAL
  Filled 2014-01-18: qty 28.35

## 2014-01-18 NOTE — Progress Notes (Signed)
Reviewed and in agreement with treatment provided.  

## 2014-01-18 NOTE — Progress Notes (Signed)
Physical Therapy Session Note  Patient Details  Name: [name] MRN: [id number] Date of Birth: 1929/06/19  Today's Date: 01/18/2014 Time: 4098-1191 Time Calculation (min): 57 min  Short Term Goals: Week 1:  PT Short Term Goal 1 (Week 1): Pt will perform functional transfers with min A PT Short Term Goal 2 (Week 1): Pt will tolerate standing balance for functional task x 1 minute with min A  Skilled Therapeutic Interventions/Progress Updates:    Session consisted of pt performing w/c propulsion x 45' with S (very slow propulsion speed and verbal cueing constantly needed in order for pt to make turns in the w/c) in order to improve functional independence, performing a w/c to mat and mat to w/c scoot pivot transfer with heavy Mod A (pt also needed constant verbal cueing in order for her to perform the transfer with proper hand placement), performing 5 sit to stand transfers with Max A and the use of a RW with pt also statically standing (pt unable to stand for longer than 20 seconds) in order for pt to improve her standing tolerance, and pt performing therex of her LLE (LAQ, seated marches, and heels to toes) in order for the pt to improve her LLE strength. Pt got nauseous and vomited after performing sit to stands transfers but reported feeling better towards the end of the session.   Therapy Documentation Precautions:  Precautions Precautions: Fall Restrictions Weight Bearing Restrictions: Yes RLE Weight Bearing: Non weight bearing  Pain:  Pt c/o 7/10 pain in her RLE. Pt took multiple rest breaks and was repositioned in the w/c in order to alleviate this pain.   See FIM for current functional status  Therapy/Group: Individual Therapy  Maryjo Ragon 01/18/2014, 12:17 PM

## 2014-01-18 NOTE — Progress Notes (Signed)
Inpatient Rehabilitation Center Individual Statement of Services  Patient Name:  [name]  Date:  01/18/2014  Welcome to the [facility].  Our goal is to provide you with an individualized program based on your diagnosis and situation, designed to meet your specific needs.  With this comprehensive rehabilitation program, you will be expected to participate in at least 3 hours of rehabilitation therapies Monday-Friday, with modified therapy programming on the weekends.  Your rehabilitation program will include the following services:  Physical Therapy (PT), Occupational Therapy (OT), 24 hour per day rehabilitation nursing, Therapeutic Recreaction (TR), Neuropsychology, Case Management (Social Worker), Rehabilitation Medicine, Nutrition Services and Pharmacy Services  Weekly team conferences will be held on Tuesdays to discuss your progress.  Your Social Worker will talk with you frequently to get your input and to update you on team discussions.  Team conferences with you and your family in attendance may also be held.  Expected length of stay: 10-14 days  Overall anticipated outcome: minimal assistance  Depending on your progress and recovery, your program may change. Your Social Worker will coordinate services and will keep you informed of any changes. Your Social Worker's name and contact numbers are listed  below.  The following services may also be recommended but are not provided by the [facility]:    [facility] will be made to provide these services after discharge if needed.  Arrangements include referral to agencies that provide these services.  Your insurance has been verified to be:  Medicare and [employer] Your primary doctor is:  Dr. Linna Darner  Pertinent information will be shared with your doctor and your insurance company.  Social Worker:  [place], [place] or ([phone]   Information discussed with and copy given to patient by: Lennart Pall, 01/18/2014, 3:30 PM

## 2014-01-18 NOTE — Plan of Care (Signed)
Problem: RH PAIN MANAGEMENT Goal: RH STG PAIN MANAGED AT OR BELOW PT'S PAIN GOAL Outcome: Progressing Pain level< 3,with medication

## 2014-01-18 NOTE — Progress Notes (Signed)
Subjective/Complaints: Had some gas over the weekend. Better with movement of bowels. Wearing oxygen "but I don't feel that i need it" A 12 point review of systems has been performed and if not noted above is otherwise negative.   Objective: Vital Signs: Blood pressure 111/50, pulse 89, temperature 97.1 F (36.2 C), temperature source Oral, resp. rate 18, SpO2 98.00%. No results found. No results found for this basename: WBC, HGB, HCT, PLT,  in the last 72 hours  Recent Labs  01/18/14 0715  NA 143  K 4.6  CL 102  GLUCOSE 93  BUN 98*  CREATININE 2.40*  CALCIUM 8.9   CBG (last 3)  No results found for this basename: GLUCAP,  in the last 72 hours  Wt Readings from Last 3 Encounters:  01/12/14 49.3 kg (108 lb 11 oz)  01/12/14 49.3 kg (108 lb 11 oz)  12/31/13 49.896 kg (110 lb)    Physical Exam:  Constitutional: She is oriented to person, place, and time.  Frail appearing. HENT:  Head: Normocephalic.  Eyes: EOM are normal.  Neck: Normal range of motion. Neck supple. No thyromegaly present.  Cardiovascular: Normal rate and regular rhythm. No murmur Respiratory: Effort normal and breath sounds normal. No respiratory distress.  GI: Soft. Bowel sounds are normal. She exhibits no distension. Non-tender Musculoskeletal: Left upper arm fistula  Left foot no pain to palpation , no pain with AROM Neurological: She is alert and oriented to person, place, and time.  Follow simple commands. UE's 5/5. LLE is 3+ HF, KE, 4- at ankle Skin:  High right AKA incision well approximated, tender. Minimal to no drainage. Chronic vascular changes to the left leg.  Psychiatric: She has a normal mood and affect. Her behavior is normal. pleasant   Assessment/Plan: 1. Functional deficits secondary to Right AKA which require 3+ hours per day of interdisciplinary therapy in a comprehensive inpatient rehab setting. Physiatrist is providing close team supervision and 24 hour management of  active medical problems listed below. Physiatrist and rehab team continue to assess barriers to discharge/monitor patient progress toward functional and medical goals. FIM: FIM - Bathing Bathing Steps Patient Completed: Chest;Abdomen;Left Arm;Right upper leg;Right Arm;Left upper leg Bathing: 3: Mod-Patient completes 5-7 69f 10 parts or 50-74%  FIM - Upper Body Dressing/Undressing Upper body dressing/undressing steps patient completed: Pull shirt over trunk;Put head through opening of pull over shirt/dress;Thread/unthread left sleeve of pullover shirt/dress;Thread/unthread right sleeve of pullover shirt/dresss Upper body dressing/undressing: 5: Set-up assist to: Obtain clothing/put away FIM - Lower Body Dressing/Undressing Lower body dressing/undressing steps patient completed: Thread/unthread left underwear leg Lower body dressing/undressing: 1: Total-Patient completed less than 25% of tasks  FIM - Musician Devices: Grab bar or rail for support Toileting: 1: Total-Patient completed zero steps, helper did all 3  FIM - Radio producer Devices: Product manager Transfers: 0-Activity did not occur  FIM - Control and instrumentation engineer Devices: Bed rails;HOB elevated Bed/Chair Transfer: 3: Supine > Sit: Mod A (lifting assist/Pt. 50-74%/lift 2 legs;3: Bed > Chair or W/C: Mod A (lift or lower assist)  FIM - Locomotion: Wheelchair Distance: 45 Locomotion: Wheelchair: 1: Travels less than 50 ft with supervision, cueing or coaxing FIM - Locomotion: Ambulation Locomotion: Ambulation:  (pt unable to perform on eval)  Comprehension Comprehension Mode: Auditory Comprehension: 6-Follows complex conversation/direction: With extra time/assistive device  Expression Expression Mode: Verbal Expression: 6-Expresses complex ideas: With extra time/assistive device  Social Interaction Social  Interaction: 6-Interacts appropriately with  others with medication or extra time (anti-anxiety, antidepressant).  Problem Solving Problem Solving: 5-Solves basic problems: With no assist  Memory Memory: 6-More than reasonable amt of time  Medical Problem List and Plan:  1. Right AKA secondary to gangrenous changes 01/11/2014   -stump sock for protection/hygiene 2. DVT Prophylaxis/Anticoagulation: Subcutaneous Lovenox.   3. Pain Management: Oxycodone as needed. Monitor with increased mobility 4. Neuropsych: This patient is capable of making decisions on her own behalf.  5. Acute blood loss anemia. Followup hgb 10.0 6. Chronic renal insufficiency. Baseline creatinine 3.48-3.76. CR now 2.4  -potassium within normal limits 7. History of atrial fibrillation with cardioversion. Cardiac rate control. No chest pain or shortness of breath  8. Hypertension. Cozaar 50 mg daily, Zaroxolyn 2.5 mg every other day, Demadex 140 mg daily.   -tight control 9. Hypothyroidism. Synthroid  10. COPD. Wore oxygen at night  -dc during the day as possible 11. GERD. Protonix   LOS (Days) 4 A FACE TO FACE EVALUATION WAS PERFORMED  SWARTZ,ZACHARY T 01/18/2014 8:37 AM

## 2014-01-18 NOTE — Progress Notes (Signed)
Patient with reported blood in stool after bowel movement. Will check CBC and hold Lovenox for now. Patient does note history of hemorrhoids and will order Anusol cream

## 2014-01-18 NOTE — Progress Notes (Signed)
Occupational Therapy Session Notes  Patient Details  Name: [name] MRN: [id number] Date of Birth: 04/18/1929  Today's Date: 01/18/2014  Short Term Goals: Week 1:  OT Short Term Goal 1 (Week 1): Patient will complete upper body bathing with setup assist OT Short Term Goal 2 (Week 1): Patient will complete lower body bathing with mod assist OT Short Term Goal 3 (Week 1): Patient will complete dressing with mod assist OT Short Term Goal 4 (Week 1): Patient will complete transfer from w/c to [facility] and back with mod assist  Skilled Therapeutic Interventions/Progress Updates:   Session #1 [phone] - 55 Minutes Individual Therapy Patient with 4/10 pain in right residual limb and from stomach "gas" pains; RN aware Upon entering room, patient found supine in bed on bedpan. Therapist recommended patient not use bedpan and transfer onto [facility], made note on safety plan in room. From here, patient rolled left <>right for therapist to remove bed pan and for peri cleaning & brief change. Patient performed supine > sit with moderate assistance(patient stated husband was assisting with this at home). EOB>w/c transfer completed from here, moderate assistance for transfer. Patient then sat at sink in w/c for UB/LB bathing & dressing and grooming tasks. Patient with decreased AROM throughout bilateral shoulders, decreasing independence with BADL tasks. According to patient and husband, husband was assisting with these tasks at home as well. At end of session, left patient seated in w/c beside bed with all needed items within reach.   Session #2 1130-1200 - 30 Minutes Individual Therapy No complaints of pain Patient found seated in w/c with daughter and husband present. Patient propelled self from room>nurses station, then therapist propelled patient > tub room. Patient engaged in tub/shower transfer on/off tub transfer bench; min assist for transfer <>bench. Therapist educated daughter on transfer and  bench. Patient's husband assisted with propelling her back to the room. Left patient seated in w/c beside bed with all needed items within reach.   Precautions:  Precautions Precautions: Fall Restrictions Weight Bearing Restrictions: Yes RLE Weight Bearing: Non weight bearing  See FIM for current functional status  Gustabo Gordillo 01/18/2014, 7:22 AM

## 2014-01-18 NOTE — Progress Notes (Signed)
Pt. Has an episode of bloody stools,It was a large amount with very strong odor.Dan PA was call to the room, and is aware of the situation.Keep- monitoring pt. Closely and assessing her needs.

## 2014-01-18 NOTE — Progress Notes (Signed)
Physical Therapy Session Note  Patient Details  Name: [name] MRN: [id number] Date of Birth: Aug 23, 1929  Today's Date: 01/18/2014 Time: 6269-4854 Time Calculation (min): 40 min  Short Term Goals: Week 1:  PT Short Term Goal 1 (Week 1): Pt will perform functional transfers with min A PT Short Term Goal 2 (Week 1): Pt will tolerate standing balance for functional task x 1 minute with min A  Skilled Therapeutic Interventions/Progress Updates:    Pt with c/o nausea again this PM and reports blood in bowels earlier, so deferred OOB. Agreeable to therex in the bed to work on functional strengthening and LE HEP including supine and sidelying positions for hip extension, hip abduction, and hip flexion as well as glut sets and modified bridging all 2 sets of 10 reps each BLE. Discussed needed DME for home with pt and husband (w/c and RW) and home accessibility by w/c. Family also reports ramp to be built for d/c.  Therapy Documentation Precautions:  Precautions Precautions: Fall Restrictions Weight Bearing Restrictions: Yes RLE Weight Bearing: Non weight bearing  Pain:  No c/o pain currently; c/o nausea - RN aware.  See FIM for current functional status  Therapy/Group: Individual Therapy  Canary Brim [facility] 01/18/2014, 3:12 PM

## 2014-01-19 ENCOUNTER — Inpatient Hospital Stay (HOSPITAL_COMMUNITY): Payer: Medicare Other | Admitting: Occupational Therapy

## 2014-01-19 ENCOUNTER — Inpatient Hospital Stay (HOSPITAL_COMMUNITY): Payer: Medicare Other

## 2014-01-19 ENCOUNTER — Encounter (HOSPITAL_COMMUNITY): Payer: Medicare Other | Admitting: Occupational Therapy

## 2014-01-19 MED ORDER — PANTOPRAZOLE SODIUM 40 MG PO TBEC: 40.0000 mg | DELAYED_RELEASE_TABLET | Freq: Every day | ORAL | Status: DC

## 2014-01-19 MED ORDER — HYDROCODONE-ACETAMINOPHEN 5-325 MG PO TABS
1.0000 | ORAL_TABLET | Freq: Four times a day (QID) | ORAL | Status: DC | PRN
  Administered 2014-01-19 – 2014-01-20 (×4): 2 via ORAL
  Administered 2014-01-20: 1 via ORAL
  Administered 2014-01-20 – 2014-01-22 (×8): 2 via ORAL
  Administered 2014-01-23: 1 via ORAL
  Administered 2014-01-23: 2 via ORAL
  Administered 2014-01-24: 1 via ORAL
  Administered 2014-01-24 – 2014-01-25 (×4): 2 via ORAL
  Administered 2014-01-25 (×2): 1 via ORAL
  Administered 2014-01-26 – 2014-01-27 (×3): 2 via ORAL
  Administered 2014-01-27 (×2): 1 via ORAL
  Administered 2014-01-27: 2 via ORAL
  Filled 2014-01-19: qty 2
  Filled 2014-01-19: qty 1
  Filled 2014-01-19 (×2): qty 2
  Filled 2014-01-19: qty 1
  Filled 2014-01-19 (×3): qty 2
  Filled 2014-01-19: qty 1
  Filled 2014-01-19 (×2): qty 2
  Filled 2014-01-19: qty 1
  Filled 2014-01-19 (×3): qty 2
  Filled 2014-01-19 (×2): qty 1
  Filled 2014-01-19 (×2): qty 2
  Filled 2014-01-19: qty 1
  Filled 2014-01-19 (×7): qty 2
  Filled 2014-01-19: qty 1
  Filled 2014-01-19 (×3): qty 2

## 2014-01-19 NOTE — Progress Notes (Signed)
Occupational Therapy Session Notes  Patient Details  Name: [name] MRN: [id number] Date of Birth: Jun 18, 1929  Today's Date: 01/19/2014  Short Term Goals: Week 1:  OT Short Term Goal 1 (Week 1): Patient will complete upper body bathing with setup assist OT Short Term Goal 2 (Week 1): Patient will complete lower body bathing with mod assist OT Short Term Goal 3 (Week 1): Patient will complete dressing with mod assist OT Short Term Goal 4 (Week 1): Patient will complete transfer from w/c to [facility] and back with mod assist  Skilled Therapeutic Interventions/Progress Updates:   Session #1 0935-1030- 55 Minutes Individual Therapy No complaints of pain Upon entering room, patient found supine in bed with RN present. Patient engaged in bed mobility and sat EOB with minimal assistance from therapist. From here, patient transferred EOB>w/c with moderate assistance. Patient then sat at sink for UB/LB bathing. Patient with urgency to use bathroom, therapist assisted patient into bathroom via w/c and patient transferred > elevated toilet seat with maximal assistance>toilet and moderate assistance from toilet. Dressing then completed at sink, patient stood to pull pants up to waist; +2 required secondary to patient with decreased balance/tolerance/endurance and increased pain. Patient left seated in w/c with all needed items within reach, husband present during entire session.   Session #2 [id number] - 30 Minutes Individual Therapy No complaints of direct pain Upon entering room, patient found seated in w/c with family present. Patient propelled self from room > dayroom for therapeutic activity focusing on dynamic sitting balance/tolerance/endurance while seated, unsupported in w/c. Patient stood X1, but unable to stand for more than a few seconds secondary to pain. Patient's family assisted patient back to room at end of session.   Precautions:  Precautions Precautions: Fall Restrictions Weight  Bearing Restrictions: Yes RLE Weight Bearing: Non weight bearing  See FIM for current functional status  [name] 01/19/2014, 7:27 AM

## 2014-01-19 NOTE — Progress Notes (Signed)
Reviewed and in agreement with treatment provided.  

## 2014-01-19 NOTE — Progress Notes (Signed)
Occupational Therapy Session Note  Patient Details  Name: [name] MRN: [id number] Date of Birth: August 06, 1929  Today's Date: 01/19/2014 Time: 9024-0973 Time Calculation (min): 60 min  Skilled Therapeutic Interventions/Progress Updates:    Pt was taken down to the therapy gym via wheelchair for session.  Worked on sit to stand and standing balance from EOM.  Pt able to stand with mod- max assist throughout session.  Pt tends to do better with sit to stand when the RUE is placed on the walker and the LUE is placed on the edge of the mat to push up.  Pt still attempts to extend her LLE out and keep her weight behind her foot when attempting to stand.  With this she ends up pushing back against the mat with her LLE to help catapult her forward.  Level of assist varies from mod to max depending on placement of the left foot and amount of forward weightshift through her trunk.  She was able to stand for 2-4 minute intervals with min assist while engaged in table top activity.  [name] was able to stabilize with one hand while performing tasks unilaterally.  She demonstrated slightly more difficulty releasing the RUE vs the LUE.  Pt did attempt some small hops forward and backwards but needs max assist to complete, secondary to not being able to support her weight with her UEs.  Attempted stand pivot with RW to bed, which she was able to do but needed max demonstrational cueing for technique as well as max assist.      Therapy Documentation Precautions:  Precautions Precautions: Fall Restrictions Weight Bearing Restrictions: No RLE Weight Bearing: Non weight bearing  Pain: Pain Assessment Pain Assessment: No/denies pain ADL: ADL ADL Comments: see FIM  See FIM for current functional status  Therapy/Group: Individual Therapy  Wilborn Membreno OTR/L 01/19/2014, 3:58 PM

## 2014-01-19 NOTE — Progress Notes (Signed)
Physical Therapy Session Note  Patient Details  Name: MELONEE GERSTEL MRN: [id number] Date of Birth: 1929-07-02  Today's Date: 01/19/2014 Time: 1116-1200 Time Calculation (min): 44 min  Short Term Goals: Week 1:  PT Short Term Goal 1 (Week 1): Pt will perform functional transfers with min A PT Short Term Goal 2 (Week 1): Pt will tolerate standing balance for functional task x 1 minute with min A  Skilled Therapeutic Interventions/Progress Updates:    Session consisted of pt performing w/c propulsion x 30' with S (verbal cueing needed in order for pt to avoid hitting objects) in order for pt to improve functional independence, performing a w/c to mat and mat to w/c scoot pivot transfer with Mod A, performing a sit to stand transfer x 3 with Max A and the use of a RW (pt only able to obtain a fully erect static standing position on her last 2 repetitions and pt needed verbal cueing throughout on proper hand placement when performing transfer) with pt also statically standing (pt unable to stand for longer than 30 seconds) in order for pt to improve her standing tolerance, performing lateral leans when seated at EOB with Max A when leaning to her right side and Mod A when leaning to her left side in order to improve pt's bed mobility, and performing a w/c to bedside commode stand pivot transfer with Max A.   Therapy Documentation Precautions:  Precautions Precautions: Fall Restrictions Weight Bearing Restrictions: Yes RLE Weight Bearing: Non weight bearing Pain:  Pt c/o stomach pain and LLE pain during session. Pt took rest breaks throughout the session in order to alleviate this pain.     See FIM for current functional status  Therapy/Group: Individual Therapy  Avyana Puffenbarger 01/19/2014, 12:19 PM

## 2014-01-19 NOTE — Telephone Encounter (Signed)
Med filled.  

## 2014-01-19 NOTE — Progress Notes (Signed)
Subjective/Complaints: Had some gas and nausea yesterday again which since has resolved. Small amount of blood also noted per rectum. A 12 point review of systems has been performed and if not noted above is otherwise negative.   Objective: Vital Signs: Blood pressure 118/62, pulse 75, temperature 97.7 F (36.5 C), temperature source Oral, resp. rate 18, SpO2 96.00%. No results found.  Recent Labs  01/18/14 1527  WBC 6.3  HGB 9.8*  HCT 31.5*  PLT 205    Recent Labs  01/18/14 0715  NA 143  K 4.6  CL 102  GLUCOSE 93  BUN 98*  CREATININE 2.40*  CALCIUM 8.9   CBG (last 3)  No results found for this basename: GLUCAP,  in the last 72 hours  Wt Readings from Last 3 Encounters:  01/12/14 49.3 kg (108 lb 11 oz)  01/12/14 49.3 kg (108 lb 11 oz)  12/31/13 49.896 kg (110 lb)    Physical Exam:  Constitutional: She is oriented to person, place, and time.  Frail appearing. HENT:  Head: Normocephalic.  Eyes: EOM are normal.  Neck: Normal range of motion. Neck supple. No thyromegaly present.  Cardiovascular: Normal rate and regular rhythm. No murmur Respiratory: Effort normal and breath sounds normal. No respiratory distress.  GI: Soft. Bowel sounds are normal. She exhibits no distension. Non-tender Musculoskeletal: Left upper arm fistula  Left foot no pain to palpation , no pain with AROM Neurological: She is alert and oriented to person, place, and time.  Follow simple commands. UE's 5/5. LLE is 3+ HF, KE, 4- at ankle Skin:  High right AKA incision well approximated, tender. Minimal   drainage. Chronic vascular changes to the left leg.  Psychiatric: She has a normal mood and affect. Her behavior is normal. pleasant   Assessment/Plan: 1. Functional deficits secondary to Right AKA which require 3+ hours per day of interdisciplinary therapy in a comprehensive inpatient rehab setting. Physiatrist is providing close team supervision and 24 hour management of  active medical problems listed below. Physiatrist and rehab team continue to assess barriers to discharge/monitor patient progress toward functional and medical goals. FIM: FIM - Bathing Bathing Steps Patient Completed: Chest;Abdomen;Left Arm;Right upper leg;Right Arm;Left upper leg Bathing: 3: Mod-Patient completes 5-7 54f 10 parts or 50-74%  FIM - Upper Body Dressing/Undressing Upper body dressing/undressing steps patient completed: Pull shirt over trunk;Put head through opening of pull over shirt/dress;Thread/unthread left sleeve of pullover shirt/dress;Thread/unthread right sleeve of pullover shirt/dresss Upper body dressing/undressing: 5: Set-up assist to: Obtain clothing/put away FIM - Lower Body Dressing/Undressing Lower body dressing/undressing steps patient completed: Thread/unthread left underwear leg Lower body dressing/undressing: 1: Total-Patient completed less than 25% of tasks  FIM - Musician Devices: Grab bar or rail for support Toileting: 1: Two helpers  FIM - Radio producer Devices: Product manager Transfers: 0-Activity did not occur  FIM - Control and instrumentation engineer Devices: Bed rails;HOB elevated Bed/Chair Transfer: 3: Supine > Sit: Mod A (lifting assist/Pt. 50-74%/lift 2 legs;3: Bed > Chair or W/C: Mod A (lift or lower assist)  FIM - Locomotion: Wheelchair Distance: 45 Locomotion: Wheelchair: 1: Travels less than 50 ft with supervision, cueing or coaxing FIM - Locomotion: Ambulation Locomotion: Ambulation:  (pt unable to perform on eval)  Comprehension Comprehension Mode: Auditory Comprehension: 5-Understands basic 90% of the time/requires cueing < 10% of the time  Expression Expression Mode: Verbal Expression: 5-Expresses basic 90% of the time/requires cueing < 10% of  the time.  Social Interaction Social Interaction: 5-Interacts appropriately 90% of the time - Needs monitoring or  encouragement for participation or interaction.  Problem Solving Problem Solving: 4-Solves basic 75 - 89% of the time/requires cueing 10 - 24% of the time  Memory Memory: 4-Recognizes or recalls 75 - 89% of the time/requires cueing 10 - 24% of the time  Medical Problem List and Plan:  1. Right AKA secondary to gangrenous changes 01/11/2014   -stump sock for protection/hygiene 2. DVT Prophylaxis/Anticoagulation: Subcutaneous Lovenox.   3. Pain Management: Oxycodone changed to hydrocodone due to potential nausea SE. 4. Neuropsych: This patient is capable of making decisions on her own behalf.  5. Acute blood loss anemia. Followup hgb 9.8, might have bleeding hemorrhoid- check stool for OB 6. Chronic renal insufficiency. Baseline creatinine 3.48-3.76. CR now 2.4  -potassium within normal limits 7. History of atrial fibrillation with cardioversion. Cardiac rate control. No chest pain or shortness of breath  8. Hypertension. Cozaar 50 mg daily, Zaroxolyn 2.5 mg every other day, Demadex 140 mg daily.   -tight control 9. Hypothyroidism. Synthroid  10. COPD. Wore oxygen at night at home  -dc during the day as able 11. GERD. Protonix, + BM, simethicone for gas  -nausea may be due to narc--changed to hydrocodone  LOS (Days) 5 A FACE TO FACE EVALUATION WAS PERFORMED  Halsey Persaud T 01/19/2014 8:31 AM

## 2014-01-20 ENCOUNTER — Inpatient Hospital Stay (HOSPITAL_COMMUNITY): Payer: Medicare Other

## 2014-01-20 ENCOUNTER — Inpatient Hospital Stay (HOSPITAL_COMMUNITY): Payer: Medicare Other | Admitting: Occupational Therapy

## 2014-01-20 ENCOUNTER — Encounter (HOSPITAL_COMMUNITY): Payer: Medicare Other | Admitting: Occupational Therapy

## 2014-01-20 ENCOUNTER — Inpatient Hospital Stay (HOSPITAL_COMMUNITY): Payer: Medicare Other | Admitting: Physical Therapy

## 2014-01-20 DIAGNOSIS — S78119A Complete traumatic amputation at level between unspecified hip and knee, initial encounter: Secondary | ICD-10-CM

## 2014-01-20 DIAGNOSIS — I739 Peripheral vascular disease, unspecified: Secondary | ICD-10-CM

## 2014-01-20 DIAGNOSIS — L98499 Non-pressure chronic ulcer of skin of other sites with unspecified severity: Secondary | ICD-10-CM

## 2014-01-20 LAB — CBC
HEMATOCRIT: 29.6 % — AB (ref 36.0–46.0)
Hemoglobin: 9 g/dL — ABNORMAL LOW (ref 12.0–15.0)
MCH: 32.4 pg (ref 26.0–34.0)
MCHC: 30.4 g/dL (ref 30.0–36.0)
MCV: 106.5 fL — ABNORMAL HIGH (ref 78.0–100.0)
Platelets: 204 10*3/uL (ref 150–400)
RBC: 2.78 MIL/uL — AB (ref 3.87–5.11)
RDW: 16.8 % — ABNORMAL HIGH (ref 11.5–15.5)
WBC: 4.8 10*3/uL (ref 4.0–10.5)

## 2014-01-20 LAB — BASIC METABOLIC PANEL
BUN: 100 mg/dL — AB (ref 6–23)
BUN: 99 mg/dL — AB (ref 6–23)
CALCIUM: 9.1 mg/dL (ref 8.4–10.5)
CHLORIDE: 98 meq/L (ref 96–112)
CO2: 25 mEq/L (ref 19–32)
CO2: 26 mEq/L (ref 19–32)
Calcium: 9.4 mg/dL (ref 8.4–10.5)
Chloride: 105 mEq/L (ref 96–112)
Creatinine, Ser: 2.48 mg/dL — ABNORMAL HIGH (ref 0.50–1.10)
Creatinine, Ser: 2.51 mg/dL — ABNORMAL HIGH (ref 0.50–1.10)
GFR calc Af Amer: 20 mL/min — ABNORMAL LOW (ref >90)
GFR calc non Af Amer: 17 mL/min — ABNORMAL LOW (ref >90)
GFR, EST AFRICAN AMERICAN: 19 mL/min — AB (ref >90)
GFR, EST NON AFRICAN AMERICAN: 17 mL/min — AB (ref >90)
GLUCOSE: 95 mg/dL (ref 70–99)
GLUCOSE: 83 mg/dL (ref 70–99)
POTASSIUM: 6.2 meq/L — AB (ref 3.7–5.3)
POTASSIUM: 4.8 meq/L (ref 3.7–5.3)
Sodium: 143 mEq/L (ref 137–147)
Sodium: 140 mEq/L (ref 137–147)

## 2014-01-20 MED ORDER — SODIUM POLYSTYRENE SULFONATE 15 GM/60ML PO SUSP
15.0000 g | Freq: Once | ORAL | Status: AC
  Administered 2014-01-20: 15 g via ORAL
  Filled 2014-01-20: qty 60

## 2014-01-20 NOTE — Progress Notes (Signed)
Physical Therapy Session Note  Patient Details  Name: [name] MRN: [id number] Date of Birth: 04/26/1929  Today's Date: 01/20/2014 Time: 6644-0347 Time Calculation (min): 45 min  Short Term Goals: Week 1:  PT Short Term Goal 1 (Week 1): Pt will perform functional transfers with min A PT Short Term Goal 2 (Week 1): Pt will tolerate standing balance for functional task x 1 minute with min A  Skilled Therapeutic Interventions/Progress Updates:    Session consisted of pt performing a w/c to bed and bed to w/c slideboard transfer x 2 with Mod A (with pt also needing verbal cueing in order anteriorly shift weight and for proper hand placement during transfer) her weight while also using a step to perform this transfer due to pt's inability for her foot to to touch the ground when sitting in w/c and at EOB (pt able to perform this transfer with less assistance and more safety than when performing a scoot pivot or squat pivot transfer) and pt performing bed mobility with Mod A. Discussed with pt and her husband about assistance pt needed at home prior to her amputation with bed mobility and transfers and discussed with them about the height of their bed at home . Pt showed poor endurance during this session which was evident by her needing to take multiple rest breaks while performing activities and in between activities.   Therapy Documentation Precautions:  Precautions Precautions: Fall Restrictions Weight Bearing Restrictions: Yes RLE Weight Bearing: Non weight bearing Pain:  Pt c/o stomach pain during the session. Pt took rest breaks throughout the session in order to alleviate this pain.   See FIM for current functional status  Therapy/Group: Individual Therapy  [name] 01/20/2014, 11:49 AM

## 2014-01-20 NOTE — Progress Notes (Signed)
Social Work Lennart Pall, [employer] Social Worker Signed  Patient Care Conference Service date: 01/20/2014 9:29 AM  Inpatient RehabilitationTeam Conference and Plan of Care Update Date: 01/19/2014   Time: 2:20 PM     Patient Name: [name]       Medical Record Number: [id number]   Date of Birth: 09-Jan-1929 Sex: Female         Room/Bed: 4W25C/4W25C-01 Payor Info: Payor: MEDICARE / Plan: MEDICARE PART A AND B / Product Type: *No Product type* /   Admitting Diagnosis: R AKA   Admit Date/Time:  01/14/2014  3:59 PM Admission Comments: No comment available   Primary Diagnosis:  <principal problem not specified> Principal Problem: <principal problem not specified>    Patient Active Problem List     Diagnosis  Date Noted   .  S/P AKA (above knee amputation)  01/14/2014   .  Protein-calorie malnutrition, severe  01/13/2014   .  Atherosclerosis of artery of extremity with rest pain  01/11/2014   .  Swelling of limb  12/25/2013   .  Bilateral leg pain  12/25/2013   .  Atherosclerosis of native arteries of the extremities with ulceration(440.23)  12/25/2013   .  Venous stasis ulcer of right lower extremity  12/25/2013   .  End stage renal disease  06/18/2012   .  CONSTIPATION  02/13/2011   .  NAUSEA AND VOMITING  11/27/2010   .  Gastric ulcer, unspecified as acute or chronic, without mention of hemorrhage, perforation, or obstruction  09/25/2010   .  Chronic kidney disease (CKD) stage G4/A2, severely decreased glomerular filtration rate (GFR) between 15-29 mL/min/1.73 square meter and albuminuria creatinine ratio between 30-299 mg/g  10/31/2009   .  HYPERLIPIDEMIA-MIXED  07/22/2009   .  CAROTID BRUITS, BILATERAL  05/25/2009   .  HEMORRHOIDS, INTERNAL, WITH COMPLICATION  62/37/6283   .  EXTERNAL HEMORRHOIDS, WITH COMPLICATION  15/17/6160   .  ANEMIA-IRON DEFICIENCY  08/09/2008   .  COLONIC POLYPS, ADENOMATOUS, HX OF  08/05/2008   .  Blood in Stool  07/16/2008   .  ATRIAL FIBRILLATION   07/11/2008   .  CHF  01/16/2008   .  GERD  01/16/2008   .  OBSTRUCTIVE SLEEP APNEA  10/31/2007   .  Hx of gout  06/19/2007   .  Unspecified Anemia  06/19/2007   .  HYPOTHYROIDISM  01/09/2007   .  HYPERTENSION  01/09/2007   .  CVA  01/09/2007   .  PEPTIC ULCER, ACUTE, HEMORRHAGE, HX OF  01/09/2007     Expected Discharge Date: Expected Discharge Date: 01/26/14  Team Members Present: Physician leading conference: Dr. Alger Simons Social Worker Present: Lennart Pall, LCSW Nurse Present:  Rayetta Humphrey, RN) PT Present: Canary Brim, Rayburn Ma, PT OT Present: Salome Spotted, OT;Kris Gellert, OT;Patricia Lissa Hoard, OT PPS Coordinator present : Daiva Nakayama, RN, CRRN        Current Status/Progress  Goal  Weekly Team Focus   Medical     right AKA. poor activity tolerance. gi issues  gi mgt, pain  increasing activity tolerance, GI mgt   Bowel/Bladder         balancing medication, pain mgt, mgt of GI issues   Swallow/Nutrition/ Hydration            ADL's     overall min>mod assist, except total assist for LB ADLs  overall supervision > min assist  ADL retraining, sit<>stands, dynamic standing balance/tolerance/endurance, pain management, overall  activity tolerance/endurance, family education, functional transfers   Mobility     Mod-Max A basic transfers, S-Min A w/c mobility, Mod A bed mobility  S w/c level (short distance w/c propulsion)  basic transfers, car transfers,  sit to stand transfers, static standing balance, dynamic sitting balance, and bed mobility   Communication            Safety/Cognition/ Behavioral Observations    no unsafe behaviors noted.  continue with bed alarm  keep patient safe  continue to monitor for unsafe behaviors.  continue with bed alarm   Pain     receiving Oxy IR 10 mg q 4 hrs prn with relief, but BP sometimes low with dinamap  keep pain < 3  monitior BP via wall unit before giving Oxy IR; monitor for pain prn   Skin     rt AKA incision without  drainage, redness, or edema.  Skin is darkened at site; stage II on sacrum, covered with Allevyn dsg  keep skin free from any new breakdown or infection  assess skin q shift and prn for breakdown or infection    Rehab Goals Patient on target to meet rehab goals: Yes *See Care Plan and progress notes for long and short-term goals.    Barriers to Discharge:  poor exercise and activity tolerance      Possible Resolutions to Barriers:    family ed, adjust goals?      Discharge Planning/Teaching Needs:    home with husband to provide 24/7 assistance   ongoing    Team Discussion:    Family reports they feel pt not doing as weel since admit to CIR.  Very frail and this program may be to demanding.  Labs ok despite possible GI bleed vs hemorrhoids.  Currently mod-max overall.  Will trial slide board transfers.  Extremely weak and hopeful she can at least reach a minimal assist level.  Concern of whether husband can provide this LOC.   Revisions to Treatment Plan:    None at this point    Continued Need for Acute Rehabilitation Level of Care: The patient requires daily medical management by a physician with specialized training in physical medicine and rehabilitation for the following conditions: Daily direction of a multidisciplinary physical rehabilitation program to ensure safe treatment while eliciting the highest outcome that is of practical value to the patient.: Yes Daily medical management of patient stability for increased activity during participation in an intensive rehabilitation regime.: Yes Daily analysis of laboratory values and/or radiology reports with any subsequent need for medication adjustment of medical intervention for : Neurological problems;Other;Post surgical problems  Mitzy Naron 01/20/2014, 9:29 AM          Patient ID: SERENAH MILL, female   DOB: [birth year], 78 y.o.   MRN: [id number]

## 2014-01-20 NOTE — Progress Notes (Signed)
Subjective/Complaints: Episodic gas and bloating. When she's experiences this she gets anxious as well.  A 12 point review of systems has been performed and if not noted above is otherwise negative.   Objective: Vital Signs: Blood pressure 123/58, pulse 76, temperature 98.2 F (36.8 C), temperature source Oral, resp. rate 18, weight 48.7 kg (107 lb 5.8 oz), SpO2 92.00%. No results found.  Recent Labs  01/18/14 1527 01/20/14 0525  WBC 6.3 4.8  HGB 9.8* 9.0*  HCT 31.5* 29.6*  PLT 205 204    Recent Labs  01/18/14 0715 01/20/14 0525  NA 143 143  K 4.6 6.2*  CL 102 105  GLUCOSE 93 95  BUN 98* 100*  CREATININE 2.40* 2.48*  CALCIUM 8.9 9.1   CBG (last 3)  No results found for this basename: GLUCAP,  in the last 72 hours  Wt Readings from Last 3 Encounters:  01/20/14 48.7 kg (107 lb 5.8 oz)  01/12/14 49.3 kg (108 lb 11 oz)  01/12/14 49.3 kg (108 lb 11 oz)    Physical Exam:  Constitutional: She is oriented to person, place, and time.  Frail appearing. HENT:  Head: Normocephalic.  Eyes: EOM are normal.  Neck: Normal range of motion. Neck supple. No thyromegaly present.  Cardiovascular: Normal rate and regular rhythm. No murmur Respiratory: Effort normal and breath sounds normal. No respiratory distress.  GI: Soft. Bowel sounds are normal. She exhibits no distension. Non-tender Musculoskeletal: Left upper arm fistula  Left foot no pain to palpation , no pain with AROM Neurological: She is alert and oriented to person, place, and time.  Follow simple commands. UE's 5/5. LLE is 3+ HF, KE, 4- at ankle Skin:  High right AKA incision well approximated, tender. Minimal   drainage. Chronic vascular changes to the left leg.  Psychiatric: She has a normal mood and affect. Her behavior is normal. pleasant   Assessment/Plan: 1. Functional deficits secondary to Right AKA which require 3+ hours per day of interdisciplinary therapy in a comprehensive inpatient rehab  setting. Physiatrist is providing close team supervision and 24 hour management of active medical problems listed below. Physiatrist and rehab team continue to assess barriers to discharge/monitor patient progress toward functional and medical goals.  I asked the patient if she wished to continue at this level of intensity of therapy and she said yes. Husband concurred. Husband stated he could provide more assist at home if needed. He agreed to be more involved with therapy regarding transfers, activities, etc.  FIM: FIM - Bathing Bathing Steps Patient Completed: Chest;Abdomen;Left Arm;Right upper leg;Right Arm;Left upper leg;Left lower leg (including foot) Bathing: 4: Min-Patient completes 8-9 71f 10 parts or 75+ percent  FIM - Upper Body Dressing/Undressing Upper body dressing/undressing steps patient completed: Pull shirt over trunk;Put head through opening of pull over shirt/dress;Thread/unthread left sleeve of pullover shirt/dress;Thread/unthread right sleeve of pullover shirt/dresss Upper body dressing/undressing: 5: Set-up assist to: Obtain clothing/put away FIM - Lower Body Dressing/Undressing Lower body dressing/undressing steps patient completed: Thread/unthread left underwear leg Lower body dressing/undressing: 1: Two helpers  FIM - Toileting Toileting steps completed by patient: Performs perineal hygiene Toileting Assistive Devices: Grab bar or rail for support Toileting: 2: Max-Patient completed 1 of 3 steps  FIM - Radio producer Devices: Recruitment consultant Transfers: 2-To toilet/BSC: Max A (lift and lower assist)  FIM - Engineer, site Assistive Devices: Bed rails;HOB elevated Bed/Chair Transfer: 3: Chair or W/C > Bed: Mod  A (lift or lower assist)  FIM - Locomotion: Wheelchair Distance: 45 Locomotion: Wheelchair: 1: Travels less than 50 ft with supervision, cueing or coaxing FIM - Locomotion: Ambulation Locomotion:  Ambulation:  (pt unable to perform on eval)  Comprehension Comprehension Mode: Auditory Comprehension: 5-Understands complex 90% of the time/Cues < 10% of the time  Expression Expression Mode: Verbal Expression: 5-Expresses complex 90% of the time/cues < 10% of the time  Social Interaction Social Interaction: 5-Interacts appropriately 90% of the time - Needs monitoring or encouragement for participation or interaction.  Problem Solving Problem Solving: 4-Solves basic 75 - 89% of the time/requires cueing 10 - 24% of the time  Memory Memory: 4-Recognizes or recalls 75 - 89% of the time/requires cueing 10 - 24% of the time  Medical Problem List and Plan:  1. Right AKA secondary to gangrenous changes 01/11/2014   -stump sock for protection/hygiene 2. DVT Prophylaxis/Anticoagulation: Subcutaneous Lovenox.   3. Pain Management: Oxycodone changed to hydrocodone due to potential nausea SE. 4. Neuropsych: This patient is capable of making decisions on her own behalf.  5. Acute blood loss anemia. Followup hgb 9.8, might have bleeding hemorrhoid-   6. Chronic renal insufficiency. Baseline creatinine 3.48-3.76.    -potassium high this am with addnl supplement---15g kayexalate today 7. History of atrial fibrillation with cardioversion. Cardiac rate control. No chest pain or shortness of breath  8. Hypertension. Cozaar 50 mg daily, Zaroxolyn 2.5 mg every other day, Demadex 140 mg daily.   -tight control 9. Hypothyroidism. Synthroid  10. COPD. Wore oxygen at night at home  -dc during the day as able 11. GERD. Protonix, + BM, simethicone for gas  -nausea and bloating apparently chronic---extensive work up per family.  -check KUB today  -  LOS (Days) 6 A FACE TO FACE EVALUATION WAS PERFORMED  SWARTZ,ZACHARY T 01/20/2014 8:26 AM

## 2014-01-20 NOTE — Progress Notes (Signed)
Physical Therapy Note  Patient Details  Name: [name] MRN: [id number] Date of Birth: 05-22-1929 Today's Date: 01/20/2014  1510-1550 (40 minutes) individual Pain: no reported pain Focus of treatment: therapeutic exercise focused on strengthening RT  AKA / bilateral UEs to improve transfers ; wc mobility training Treatment: Pt up in wc upon arrival ; wc mobility 75 feet, 120 feet SBA with increased time; transfer min vcs for wc setup; transfer with sliding board (level) SBA with assist for board placement x 2; scooting side to side on mat focusing on forward trunk flexion; sit to supine (mat) SBA; supine to side to sit min assist trunk; therapeutic exercises in supine RT AKA hip flexion/extension/abduction; returned to room with all needs within reach/husband present.    Sharrod Achille,JIM 01/20/2014, 3:51 PM

## 2014-01-20 NOTE — Patient Care Conference (Addendum)
Inpatient RehabilitationTeam Conference and Plan of Care Update Date: 01/19/2014   Time: 2:20 PM    Patient Name: [name]      Medical Record Number: [id number]  Date of Birth: [birth year] Sex: Female         Room/Bed: 4W25C/4W25C-01 Payor Info: Payor: MEDICARE / Plan: MEDICARE PART A AND B / Product Type: *No Product type* /    Admitting Diagnosis: R AKA  Admit Date/Time:  01/14/2014  3:59 PM Admission Comments: No comment available   Primary Diagnosis:  <principal problem not specified> Principal Problem: <principal problem not specified>  Patient Active Problem List   Diagnosis Date Noted  . S/P AKA (above knee amputation) 01/14/2014  . Protein-calorie malnutrition, severe 01/13/2014  . Atherosclerosis of artery of extremity with rest pain 01/11/2014  . Swelling of limb 12/25/2013  . Bilateral leg pain 12/25/2013  . Atherosclerosis of native arteries of the extremities with ulceration(440.23) 12/25/2013  . Venous stasis ulcer of right lower extremity 12/25/2013  . End stage renal disease 06/18/2012  . CONSTIPATION 02/13/2011  . NAUSEA AND VOMITING 11/27/2010  . Gastric ulcer, unspecified as acute or chronic, without mention of hemorrhage, perforation, or obstruction 09/25/2010  . Chronic kidney disease (CKD) stage G4/A2, severely decreased glomerular filtration rate (GFR) between 15-29 mL/min/1.73 square meter and albuminuria creatinine ratio between 30-299 mg/g 10/31/2009  . HYPERLIPIDEMIA-MIXED 07/22/2009  . CAROTID BRUITS, BILATERAL 05/25/2009  . HEMORRHOIDS, INTERNAL, WITH COMPLICATION 62/95/2841  . EXTERNAL HEMORRHOIDS, WITH COMPLICATION 32/44/0102  . ANEMIA-IRON DEFICIENCY 08/09/2008  . COLONIC POLYPS, ADENOMATOUS, HX OF 08/05/2008  . Blood in Stool 07/16/2008  . ATRIAL FIBRILLATION 07/11/2008  . CHF 01/16/2008  . GERD 01/16/2008  . OBSTRUCTIVE SLEEP APNEA 10/31/2007  . Hx of gout 06/19/2007  . Unspecified Anemia 06/19/2007  . HYPOTHYROIDISM 01/09/2007  .  HYPERTENSION 01/09/2007  . CVA 01/09/2007  . PEPTIC ULCER, ACUTE, HEMORRHAGE, HX OF 01/09/2007    Expected Discharge Date: Expected Discharge Date: 01/26/14  Team Members Present: Physician leading conference: Dr. Alger Simons Social Worker Present: Lennart Pall, LCSW Nurse Present:  Rayetta Humphrey, RN) PT Present: Canary Brim, Rayburn Ma, PT OT Present: Salome Spotted, OT;Kris Gellert, OT;Patricia Lissa Hoard, OT PPS Coordinator present : Daiva Nakayama, RN, CRRN     Current Status/Progress Goal Weekly Team Focus  Medical   right AKA. poor activity tolerance. gi issues  gi mgt, pain  increasing activity tolerance, GI mgt   Bowel/Bladder         balancing medication, pain mgt, mgt of GI issues   Swallow/Nutrition/ Hydration             ADL's   overall min>mod assist, except total assist for LB ADLs  overall supervision > min assist  ADL retraining, sit<>stands, dynamic standing balance/tolerance/endurance, pain management, overall activity tolerance/endurance, family education, functional transfers   Mobility   Mod-Max A basic transfers, S-Min A w/c mobility, Mod A bed mobility  S w/c level (short distance w/c propulsion)  basic transfers, car transfers,  sit to stand transfers, static standing balance, dynamic sitting balance, and bed mobility   Communication             Safety/Cognition/ Behavioral Observations  no unsafe behaviors noted.  continue with bed alarm  keep patient safe  continue to monitor for unsafe behaviors.  continue with bed alarm   Pain   receiving Oxy IR 10 mg q 4 hrs prn with relief, but BP sometimes low with dinamap  keep pain < 3  monitior  BP via wall unit before giving Oxy IR; monitor for pain prn   Skin   rt AKA incision without drainage, redness, or edema.  Skin is darkened at site; stage II on sacrum, covered with Allevyn dsg  keep skin free from any new breakdown or infection  assess skin q shift and prn for breakdown or infection    Rehab  Goals Patient on target to meet rehab goals: Yes *See Care Plan and progress notes for long and short-term goals.  Barriers to Discharge: poor exercise and activity tolerance    Possible Resolutions to Barriers:  family ed, adjust goals?    Discharge Planning/Teaching Needs:  home with husband to provide 24/7 assistance  ongoing   Team Discussion:  Family reports they feel pt not doing as weel since admit to CIR.  Very frail and this program may be to demanding.  Labs ok despite possible GI bleed vs hemorrhoids.  Currently mod-max overall.  Will trial slide board transfers.  Extremely weak and hopeful she can at least reach a minimal assist level.  Concern of whether husband can provide this LOC.  Revisions to Treatment Plan:  None at this point   Continued Need for Acute Rehabilitation Level of Care: The patient requires daily medical management by a physician with specialized training in physical medicine and rehabilitation for the following conditions: Daily direction of a multidisciplinary physical rehabilitation program to ensure safe treatment while eliciting the highest outcome that is of practical value to the patient.: Yes Daily medical management of patient stability for increased activity during participation in an intensive rehabilitation regime.: Yes Daily analysis of laboratory values and/or radiology reports with any subsequent need for medication adjustment of medical intervention for : Neurological problems;Other;Post surgical problems  [name] 01/20/2014, 9:29 AM

## 2014-01-20 NOTE — Progress Notes (Signed)
Occupational Therapy Session Notes  Patient Details  Name: [name] MRN: [id number] Date of Birth: 30-Dec-1928  Today's Date: 01/20/2014  Short Term Goals: Week 1:  OT Short Term Goal 1 (Week 1): Patient will complete upper body bathing with setup assist OT Short Term Goal 2 (Week 1): Patient will complete lower body bathing with mod assist OT Short Term Goal 3 (Week 1): Patient will complete dressing with mod assist OT Short Term Goal 4 (Week 1): Patient will complete transfer from w/c to [facility] and back with mod assist  Skilled Therapeutic Interventions/Progress Updates:   Session #1 1035-1130 - 55 Minutes Individual Therapy No complaints of pain Upon entering room, patient found seated on elevated toilet seat in bathroom. Patient stated nursing staff assisted with getting > toilet. Patient performed toileting of peri cleaning, and required assistance for clothing management. Patient transferred > w/c from elevated toilet seat with moderate assistance. From here, patient completed UB/LB bathing and dressing at sink; sit<>stands performed with moderate assistance and +2 assistance required for static & dynamic standing tasks. Daughter assisted as a +2 for standing tasks. Therapist discussed d/c planning with daughters and husband regarding ADLs. Grooming tasks completed seated at sink in w/c. At end of session, left patient seated in w/c with family present (daughters and husband).   Session #2 1345-1430 - 73 Minutes Individual Therapy No complaints of pain Upon entering room, patient found seated in w/c with great grandchild and husband present. Patient propelled self > therapy gym, taking more than a reasonable amount of time. Once in gym, patient transferred w/c > edge of mat using slide board with minimal assistance from therapist. Patient sat edge of mat and engaged in seated tricep strengthening pushups. Patient then transferred back to w/c using slide board with steady assist.  Patient then attempted ergometer machine, but due to decreased ROM throughout bilateral shoulders patient unable to successfully do this exercise. Patient's husband propelled patient back to room.   Precautions:  Precautions Precautions: Fall Restrictions Weight Bearing Restrictions: Yes RLE Weight Bearing: Non weight bearing  See FIM for current functional status  Zanita Millman 01/20/2014, 7:29 AM

## 2014-01-20 NOTE — Progress Notes (Signed)
Reviewed and in agreement with treatment provided.  

## 2014-01-20 NOTE — Progress Notes (Signed)
Social Work Patient ID: [name], female   DOB: 11-Jan-1929, 80 y.o.   MRN: [id number]  Met with pt and husband yesterday following team conference.  Both aware of targeted d/c date of 2/17, however, both very skeptical that pt will be "ready".  Discussed our hope that she will be able to reach minimal assistance goals and that we will need to work with them to determine if husband can provide this LOC.  Both agree with concerns.  Husband feels that he will be able to manage this.   Message received for me to contact their daughter - will do so today and discuss concerns with her as well.  Amous Crewe, LCSW

## 2014-01-21 ENCOUNTER — Encounter (HOSPITAL_COMMUNITY): Payer: Medicare Other | Admitting: Occupational Therapy

## 2014-01-21 ENCOUNTER — Inpatient Hospital Stay (HOSPITAL_COMMUNITY): Payer: Medicare Other | Admitting: Occupational Therapy

## 2014-01-21 ENCOUNTER — Inpatient Hospital Stay (HOSPITAL_COMMUNITY): Payer: Medicare Other

## 2014-01-21 LAB — BASIC METABOLIC PANEL
BUN: 96 mg/dL — AB (ref 6–23)
CHLORIDE: 101 meq/L (ref 96–112)
CO2: 28 meq/L (ref 19–32)
CREATININE: 2.55 mg/dL — AB (ref 0.50–1.10)
Calcium: 9 mg/dL (ref 8.4–10.5)
GFR calc Af Amer: 19 mL/min — ABNORMAL LOW (ref >90)
GFR calc non Af Amer: 16 mL/min — ABNORMAL LOW (ref >90)
Glucose, Bld: 84 mg/dL (ref 70–99)
Potassium: 4 mEq/L (ref 3.7–5.3)
Sodium: 143 mEq/L (ref 137–147)

## 2014-01-21 MED ORDER — POTASSIUM CHLORIDE CRYS ER 20 MEQ PO TBCR
20.0000 meq | EXTENDED_RELEASE_TABLET | Freq: Every day | ORAL | Status: DC
  Administered 2014-01-21 – 2014-01-27 (×7): 20 meq via ORAL
  Filled 2014-01-21 (×8): qty 1

## 2014-01-21 NOTE — Progress Notes (Signed)
Subjective/Complaints: Had a good day yesterday. Feeling well today A 12 point review of systems has been performed and if not noted above is otherwise negative.   Objective: Vital Signs: Blood pressure 110/53, pulse 91, temperature 97.8 F (36.6 C), temperature source Oral, resp. rate 17, weight 48.7 kg (107 lb 5.8 oz), SpO2 97.00%. Dg Abd 1 View  01/20/2014   CLINICAL DATA:  Abdominal bloating and nausea for the past 3 months.  EXAM: ABDOMEN - 1 VIEW  COMPARISON:  No priors.  FINDINGS: Gas and stool are noted throughout the colon extending to the level of the distal rectum. No pathologic distention of small bowel. No gross pneumoperitoneum on this single supine view of the abdomen. Extensive vascular calcifications are noted throughout the abdominal and pelvic vasculature.  IMPRESSION: 1. Nonobstructive bowel gas pattern. 2. No pneumoperitoneum. 3. Severe atherosclerosis.   Electronically Signed   By: Vinnie Langton M.D.   On: 01/20/2014 10:04    Recent Labs  01/18/14 1527 01/20/14 0525  WBC 6.3 4.8  HGB 9.8* 9.0*  HCT 31.5* 29.6*  PLT 205 204    Recent Labs  01/20/14 1440 01/21/14 0600  NA 140 143  K 4.8 4.0  CL 98 101  GLUCOSE 83 84  BUN 99* 96*  CREATININE 2.51* 2.55*  CALCIUM 9.4 9.0   CBG (last 3)  No results found for this basename: GLUCAP,  in the last 72 hours  Wt Readings from Last 3 Encounters:  01/20/14 48.7 kg (107 lb 5.8 oz)  01/12/14 49.3 kg (108 lb 11 oz)  01/12/14 49.3 kg (108 lb 11 oz)    Physical Exam:  Constitutional: She is oriented to person, place, and time.  Frail appearing. HENT:  Head: Normocephalic.  Eyes: EOM are normal.  Neck: Normal range of motion. Neck supple. No thyromegaly present.  Cardiovascular: Normal rate and regular rhythm. No murmur Respiratory: Effort normal and breath sounds normal. No respiratory distress.  GI: Soft. Bowel sounds are normal. She exhibits no distension. Non-tender Musculoskeletal: Left  upper arm fistula  Left foot no pain to palpation , no pain with AROM Neurological: She is alert and oriented to person, place, and time.  Follow simple commands. UE's 5/5. LLE is 3+ HF, KE, 4- at ankle Skin:  High right AKA incision well approximated, tender. Minimal   drainage. Chronic vascular changes to the left leg.  Psychiatric: She has a normal mood and affect. Her behavior is normal. pleasant   Assessment/Plan: 1. Functional deficits secondary to Right AKA which require 3+ hours per day of interdisciplinary therapy in a comprehensive inpatient rehab setting. Physiatrist is providing close team supervision and 24 hour management of active medical problems listed below. Physiatrist and rehab team continue to assess barriers to discharge/monitor patient progress toward functional and medical goals.    FIM: FIM - Bathing Bathing Steps Patient Completed: Chest;Abdomen;Left Arm;Right upper leg;Right Arm;Left upper leg;Left lower leg (including foot) Bathing: 4: Min-Patient completes 8-9 38f 10 parts or 75+ percent  FIM - Upper Body Dressing/Undressing Upper body dressing/undressing steps patient completed: Pull shirt over trunk;Put head through opening of pull over shirt/dress;Thread/unthread left sleeve of pullover shirt/dress;Thread/unthread right sleeve of pullover shirt/dresss Upper body dressing/undressing: 5: Set-up assist to: Obtain clothing/put away FIM - Lower Body Dressing/Undressing Lower body dressing/undressing steps patient completed: Thread/unthread left underwear leg Lower body dressing/undressing: 1: Two helpers  FIM - Toileting Toileting steps completed by patient: Adjust clothing prior to toileting;Performs perineal hygiene  Toileting Assistive Devices: Grab bar or rail for support Toileting: 2: Max-Patient completed 1 of 3 steps  FIM - Radio producer Devices: Recruitment consultant Transfers: 2-To toilet/BSC: Max A (lift and lower  assist)  FIM - Control and instrumentation engineer Devices: Sliding board Bed/Chair Transfer: 3: Supine > Sit: Mod A (lifting assist/Pt. 50-74%/lift 2 legs;3: Sit > Supine: Mod A (lifting assist/Pt. 50-74%/lift 2 legs);3: Bed > Chair or W/C: Mod A (lift or lower assist);3: Chair or W/C > Bed: Mod A (lift or lower assist)  FIM - Locomotion: Wheelchair Distance: 45 Locomotion: Wheelchair: 1: Travels less than 50 ft with supervision, cueing or coaxing FIM - Locomotion: Ambulation Locomotion: Ambulation:  (pt unable to perform on eval)  Comprehension Comprehension Mode: Auditory Comprehension: 5-Understands complex 90% of the time/Cues < 10% of the time  Expression Expression Mode: Verbal Expression: 5-Expresses basic 90% of the time/requires cueing < 10% of the time.  Social Interaction Social Interaction: 5-Interacts appropriately 90% of the time - Needs monitoring or encouragement for participation or interaction.  Problem Solving Problem Solving: 4-Solves basic 75 - 89% of the time/requires cueing 10 - 24% of the time  Memory Memory: 4-Recognizes or recalls 75 - 89% of the time/requires cueing 10 - 24% of the time  Medical Problem List and Plan:  1. Right AKA secondary to gangrenous changes 01/11/2014   -stump sock for protection/hygiene 2. DVT Prophylaxis/Anticoagulation: Subcutaneous Lovenox.   3. Pain Management: Oxycodone changed to hydrocodone due to potential nausea SE. 4. Neuropsych: This patient is capable of making decisions on her own behalf.  5. Acute blood loss anemia. Followup hgb 9.8, might have bleeding hemorrhoid-   6. Chronic renal insufficiency. Baseline creatinine 3.48-3.76.    -potassium high this am with addnl supplement---15g kayexalate today 7. History of atrial fibrillation with cardioversion. Cardiac rate control. No chest pain or shortness of breath  8. Hypertension. Cozaar 50 mg daily, Zaroxolyn 2.5 mg every other day, Demadex 140 mg  daily.   -tight control 9. Hypothyroidism. Synthroid  10. COPD. Wore oxygen at night at home  -dc during the day as able 11. GERD. Protonix, + BM, simethicone for gas  -nausea and bloating apparently chronic---extensive work up per family  -KUB with diffuse atherosclerosis---she may be having intermittent ischemia to gut which leads to abdominal pain and bloating---discussed potential scenario with pt/husband--observe for now, low residue diet  -?discuss with GI  LOS (Days) 7 A FACE TO FACE EVALUATION WAS PERFORMED  Azalynn Maxim T 01/21/2014 9:15 AM

## 2014-01-21 NOTE — Progress Notes (Signed)
Physical Therapy Session Note  Patient Details  Name: TERISHA LOSASSO MRN: [id number] Date of Birth: 22-Jan-1929  Today's Date: 01/21/2014 Time: 1002-1032 Time Calculation (min): 30 min  Short Term Goals: Week 1:  PT Short Term Goal 1 (Week 1): Pt will perform functional transfers with min A PT Short Term Goal 2 (Week 1): Pt will tolerate standing balance for functional task x 1 minute with min A  Skilled Therapeutic Interventions/Progress Updates:    Session consisted of pt working on laterally leaning while on the toilet seat in order for donning and doffing of her diaper to occur, working on a w/c to bed slideboard transfer with Min A (S needed for most of the transfer but Min A needed once due to [place] of the pt when performing this transfer) with verbal cueing also needed during the transfer in order for pt to perform transfer with proper hand placement and a proper anterior weight shift), performing sit to supine with Mod A (cueing also needed for proper rolling technique), and performing therex of RLE (supine hip abd/add and SLR) and LLE (SLR, supine hip abd/add, heel slides, and ankle pumps) in order to improve pt's LE strength bilaterally. 30 minutes of therapy time were missed due to pt being on the toilet upon arrival and needing nursing support early in the session.   Therapy Documentation Precautions:  Precautions Precautions: Fall Restrictions Weight Bearing Restrictions: Yes RLE Weight Bearing: Non weight bearing Pain:  Pt c/o stomach pain throughout the therapy session. Pt took rest breaks in order to alleviate this pain.   See FIM for current functional status  Therapy/Group: Individual Therapy  Elijahjames Fuelling 01/21/2014, 12:13 PM

## 2014-01-21 NOTE — Progress Notes (Signed)
Physical Therapy Session Note  Patient Details  Name: [name] MRN: [id number] Date of Birth: 1929/01/19  Today's Date: 01/21/2014 Time: 1302-1401 Time Calculation (min): 59 min  Short Term Goals: Week 1:  PT Short Term Goal 1 (Week 1): Pt will perform functional transfers with min A PT Short Term Goal 2 (Week 1): Pt will tolerate standing balance for functional task x 1 minute with min A  Skilled Therapeutic Interventions/Progress Updates:    Session consisted of pt performing basic slideboard transfers (S required for setup and verbal cueing on hand placement when transferring to lower surface and Min A required in order to perform transfer to an elevated surface), car slideboard transfers x 2(requiring Min A with pt mainly needing assistance to lift her leg into the car and needing constant verbal cueing in order to anteriorly shift weight and bare weight through UE's and LLE), performing supine to sit with Mod A (pt needing verbal cueing in order to properly bare weight through her LUE when going from a sidelying to sitting position), w/c propulsion from pt's room to the end of the hallway with S in order to improve functional independence, and scooting at the edge of the mat with S (pt showing much improvement with this activity due to an improvement in UE and LE strength) in order to improve bed mobility.  Therapy Documentation Precautions:  Precautions Precautions: Fall Restrictions Weight Bearing Restrictions: Yes RLE Weight Bearing: Non weight bearing  Pain:  Pt c/o mild pain in her RLE during the session. Pt took rest breaks in order to alleviate this pain.   See FIM for current functional status  Therapy/Group: Individual Therapy  Huntleigh Doolen 01/21/2014, 3:13 PM

## 2014-01-21 NOTE — Progress Notes (Signed)
Occupational Therapy Session Notes  Patient Details  Name: [name] MRN: [id number] Date of Birth: [birth year]  Today's Date: 01/21/2014  Short Term Goals: Week 1:  OT Short Term Goal 1 (Week 1): Patient will complete upper body bathing with setup assist OT Short Term Goal 2 (Week 1): Patient will complete lower body bathing with mod assist OT Short Term Goal 3 (Week 1): Patient will complete dressing with mod assist OT Short Term Goal 4 (Week 1): Patient will complete transfer from w/c to [facility] and back with mod assist  Skilled Therapeutic Interventions/Progress Updates:   Session #1 6644-0347 - 55 Minutes Individual Therapy No complaints of pain Upon entering room, patient found seated on BSC with NT present. Patient completed toileting with max assist, then transferred back to EOB with max assist. While seated EOB, patient completed UB/LB bathing & dressing tasks. Recommending patient complete ADL at bed level at this time secondary to patient's decreased activity tolerance/endurance, increased pain, and overall decreased strength AND to decrease burden of care on caregivers. Patient stated she normally took showers ~2X per week; recommend showers this often and bed level ADLs rest of the time. Patient transferred from EOB > w/c with mod assist (squat pivot technique). Patient then sat at sink for grooming tasks of brushing teeth and hair. Patient with request to get on toilet after grooming tasks, mod assist for sit>stand to pull pants down and mod assist for stand pivot transfer(using grab bars). Left patient seated on elevated toilet seat in bathroom waiting on next therapist.   Session #2 [id number] - 11 Minutes Individual Therapy No complaints of pain Patient found in dayroom. Engaged patient in "Valentine's social" put on by TR. Patient performed sit>stand and stood dynamically to pick out Valentine's day cards for family. Patient required max assist for sit<>stand and mod assist  for dynamic standing balance/endurance. Therapist then engaged patient in BLE and BUE strengthening exercises, using body weight and 2lb dumbbell. During rest breaks, patient would eat snacks from social. Family aware of patient's new diet restrictions given by dietician. Therapist discussed d/c planning with family and education on recommendation of tub transfer bench and drop arm BSC; will notify SW of this. At end of session, left patient with family to propel patient back to the room.   Precautions:  Precautions Precautions: Fall Restrictions Weight Bearing Restrictions: Yes RLE Weight Bearing: Non weight bearing  ADL: ADL ADL Comments: see FIM  See FIM for current functional status  Jais Demir 01/21/2014, 7:22 AM

## 2014-01-21 NOTE — Progress Notes (Signed)
Physical Therapy Weekly Progress Note  Patient Details  Name: [name] MRN: [id number] Date of Birth: 1929/04/28  Today's Date: 01/21/2014  Patient has met 1 of 2 short term goals. Patient continues to make progress and is now able to perform bed mobility with Mod A, basic transfers with Min A with the use of a slideboard, dynamic sitting balance with S, and car transfers with Min A. The one goal that the patient did not meet is due to the fact that she is unable to tolerate static standing for more than 20 seconds with Min A and the use of a RW.  Patient continues to demonstrate the following deficits: basic transfers with slideboard, car transfers, dynamic sitting balance, and bed mobility and therefore will continue to benefit from skilled PT intervention to enhance overall performance with activity tolerance, balance, postural control, ability to compensate for deficits and functional use of  right upper extremity, right lower extremity, left upper extremity and left lower extremity.  Patient progressing toward long term goals..  Continue plan of care.  PT Short Term Goals Week 1:  PT Short Term Goal 1 (Week 1): Pt will perform functional transfers with min A PT Short Term Goal 1 - Progress (Week 1): Met PT Short Term Goal 2 (Week 1): Pt will tolerate standing balance for functional task x 1 minute with min A PT Short Term Goal 2 - Progress (Week 1): Not met (pt unable to tolerate static stance for more than 20 seconds with Min A and use of a RW)  Week 2: Short Term Goals= Long Term Goals  Skilled Therapeutic Interventions/Progress Updates Balance/vestibular training;Community reintegration;Discharge planning;Disease management/prevention;DME/adaptive equipment instruction;Functional mobility training;Neuromuscular re-education;Pain management;Patient/family education;Psychosocial support;Skin care/wound management;Therapeutic Activities;Therapeutic Exercise;UE/LE Strength  taining/ROM;UE/LE Coordination activities;Wheelchair propulsion/positioning   Therapy Documentation Precautions:  Precautions Precautions: Fall Restrictions Weight Bearing Restrictions: Yes RLE Weight Bearing: Non weight bearing    See FIM for current functional status  Therapy/Group: Individual Therapy  Maleek Craver 01/21/2014, 3:35 PM

## 2014-01-21 NOTE — Progress Notes (Signed)
NUTRITION FOLLOW UP  Intervention:   1. Supplements; continue Resource Breeze po daily, each supplement provides 250 kcal and 9 grams of protein 2.  Brief education; provided to pt and family.  RD contact information provided.  All questions answered.    NUTRITION DIAGNOSIS:  Malnutrition related to chronic disease as evidenced by severe fat and muscle wasting.   Monitor:  1. Food/Beverage; pt meeting >/=90% estimated needs with tolerance.  2. Wt/wt change; monitor trends  Assessment:   Pt admitted for elective right AKA for gangrene/ischemic limb.  RD observed meal this evening.  Pt actually consumes 25-30% of meals (family prepares her a plate of entree on a bread plate).  Family states pt "eats like a bird." Family with questions r/t low fiber diet.  Pt with ongoing abdominal cramping after meals.   RD provided "Low Fiber Nutrition Therapy"  handout from the Academy of [employer]etics. Reviewed home diet with pt and suggested ways to meet nutrition goals over the next several weeks. Explained reasons to follow Low Fiber diet and discussed ways to achieve. Encouraged fresh fruits and vegetables. Encouraged fluid intake.  Pt verbalizes understanding of information provided.  Expect good compliance.  Pt will continue to Breeze supplements.    Height: Ht Readings from Last 1 Encounters:  01/11/14 4\' 10"  (1.473 m)    Weight Status:   Wt Readings from Last 1 Encounters:  01/20/14 107 lb 5.8 oz (48.7 kg)    Re-estimated needs:  Kcal: 1300-1500 Protein: 50-60g Fluid: >1.5 L/day  Skin: incision  Diet Order: Cardiac   Intake/Output Summary (Last 24 hours) at 01/21/14 1659 Last data filed at 01/21/14 1200  Gross per 24 hour  Intake   1080 ml  Output      5 ml  Net   1075 ml    Last BM: 2/12  Labs:   Recent Labs Lab 01/20/14 0525 01/20/14 1440 01/21/14 0600  NA 143 140 143  K 6.2* 4.8 4.0  CL 105 98 101  CO2 25 26 28   BUN 100* 99* 96*  CREATININE  2.48* 2.51* 2.55*  CALCIUM 9.1 9.4 9.0  GLUCOSE 95 83 84    CBG (last 3)  No results found for this basename: GLUCAP,  in the last 72 hours  Scheduled Meds: . allopurinol  100 mg Oral Q48H  . calcitRIOL  0.5 mcg Oral Q48H  . darbepoetin  25 mcg Subcutaneous Q7 days  . feeding supplement (RESOURCE BREEZE)  1 Container Oral Q24H  . hydrocortisone   Rectal TID  . levothyroxine  75 mcg Oral QAC breakfast  . losartan  50 mg Oral Daily  . metolazone  2.5 mg Oral Q48H  . pantoprazole  40 mg Oral Daily  . potassium chloride  20 mEq Oral Daily  . torsemide  140 mg Oral Daily  . vitamin C  500 mg Oral Daily    Continuous Infusions:   Brynda Greathouse, MS RD LDN Clinical Inpatient Dietitian Pager: [phone] Weekend/After hours pager: [phone]

## 2014-01-21 NOTE — Progress Notes (Signed)
Reviewed and in agreement with treatment provided.  

## 2014-01-21 NOTE — Progress Notes (Signed)
Reviewed and in agreement with weekly assessment provided.

## 2014-01-22 ENCOUNTER — Encounter (HOSPITAL_COMMUNITY): Payer: Medicare Other | Admitting: Occupational Therapy

## 2014-01-22 ENCOUNTER — Inpatient Hospital Stay (HOSPITAL_COMMUNITY): Payer: Medicare Other | Admitting: Physical Therapy

## 2014-01-22 ENCOUNTER — Encounter (HOSPITAL_COMMUNITY): Payer: Self-pay | Admitting: Internal Medicine

## 2014-01-22 ENCOUNTER — Inpatient Hospital Stay (HOSPITAL_COMMUNITY): Payer: Medicare Other

## 2014-01-22 DIAGNOSIS — R1013 Epigastric pain: Secondary | ICD-10-CM

## 2014-01-22 DIAGNOSIS — I700 Atherosclerosis of aorta: Secondary | ICD-10-CM

## 2014-01-22 DIAGNOSIS — G8929 Other chronic pain: Secondary | ICD-10-CM

## 2014-01-22 DIAGNOSIS — R634 Abnormal weight loss: Secondary | ICD-10-CM

## 2014-01-22 MED ORDER — POLYETHYLENE GLYCOL 3350 17 G PO PACK
17.0000 g | PACK | Freq: Every day | ORAL | Status: DC
  Administered 2014-01-23 – 2014-01-26 (×3): 17 g via ORAL
  Filled 2014-01-22 (×7): qty 1

## 2014-01-22 NOTE — Progress Notes (Signed)
Subjective/Complaints: Still has nausea, bloating, pain in the abdomen after eating especially (chronic issue apparently ) A 12 point review of systems has been performed and if not noted above is otherwise negative.   Objective: Vital Signs: Blood pressure 129/57, pulse 65, temperature 97.4 F (36.3 C), temperature source Oral, resp. rate 18, weight 48.7 kg (107 lb 5.8 oz), SpO2 98.00%. Dg Abd 1 View  01/20/2014   CLINICAL DATA:  Abdominal bloating and nausea for the past 3 months.  EXAM: ABDOMEN - 1 VIEW  COMPARISON:  No priors.  FINDINGS: Gas and stool are noted throughout the colon extending to the level of the distal rectum. No pathologic distention of small bowel. No gross pneumoperitoneum on this single supine view of the abdomen. Extensive vascular calcifications are noted throughout the abdominal and pelvic vasculature.  IMPRESSION: 1. Nonobstructive bowel gas pattern. 2. No pneumoperitoneum. 3. Severe atherosclerosis.   Electronically Signed   By: Vinnie Langton M.D.   On: 01/20/2014 10:04    Recent Labs  01/20/14 0525  WBC 4.8  HGB 9.0*  HCT 29.6*  PLT 204    Recent Labs  01/20/14 1440 01/21/14 0600  NA 140 143  K 4.8 4.0  CL 98 101  GLUCOSE 83 84  BUN 99* 96*  CREATININE 2.51* 2.55*  CALCIUM 9.4 9.0   CBG (last 3)  No results found for this basename: GLUCAP,  in the last 72 hours  Wt Readings from Last 3 Encounters:  01/20/14 48.7 kg (107 lb 5.8 oz)  01/12/14 49.3 kg (108 lb 11 oz)  01/12/14 49.3 kg (108 lb 11 oz)    Physical Exam:  Constitutional: She is oriented to person, place, and time.  Frail appearing. HENT:  Head: Normocephalic.  Eyes: EOM are normal.  Neck: Normal range of motion. Neck supple. No thyromegaly present.  Cardiovascular: Normal rate and regular rhythm. No murmur Respiratory: Effort normal and breath sounds normal. No respiratory distress.  GI: Soft. Bowel sounds are normal. She exhibits no distension.  minimally-tender Musculoskeletal: Left upper arm fistula  Left foot no pain to palpation , no pain with AROM Neurological: She is alert and oriented to person, place, and time.  Follow simple commands. UE's 5/5. LLE is 3+ HF, KE, 4- at ankle Skin:  High right AKA incision well approximated, tender. Minimal   drainage. Chronic vascular changes to the left leg.  Psychiatric: She has a normal mood and affect. Her behavior is normal. pleasant   Assessment/Plan: 1. Functional deficits secondary to Right AKA which require 3+ hours per day of interdisciplinary therapy in a comprehensive inpatient rehab setting. Physiatrist is providing close team supervision and 24 hour management of active medical problems listed below. Physiatrist and rehab team continue to assess barriers to discharge/monitor patient progress toward functional and medical goals.    FIM: FIM - Bathing Bathing Steps Patient Completed: Chest;Abdomen;Left Arm;Right upper leg;Right Arm;Left upper leg;Left lower leg (including foot);Front perineal area Bathing: 4: Min-Patient completes 8-9 35f 10 parts or 75+ percent  FIM - Upper Body Dressing/Undressing Upper body dressing/undressing steps patient completed: Pull shirt over trunk;Thread/unthread left sleeve of pullover shirt/dress;Thread/unthread right sleeve of pullover shirt/dresss;Hook/unhook bra Upper body dressing/undressing: 4: Min-Patient completed 75 plus % of tasks FIM - Lower Body Dressing/Undressing Lower body dressing/undressing steps patient completed: Thread/unthread left underwear leg Lower body dressing/undressing: 1: Total-Patient completed less than 25% of tasks  FIM - Toileting Toileting steps completed by patient: Performs perineal hygiene Toileting  Assistive Devices: Grab bar or rail for support Toileting: 2: Max-Patient completed 1 of 3 steps  FIM - Radio producer Devices: Recruitment consultant Transfers: 3-To toilet/BSC:  Mod A (lift or lower assist);3-From toilet/BSC: Mod A (lift or lower assist);2-From toilet/BSC: Max A (lift and lower assist)  FIM - Control and instrumentation engineer Devices: Sliding board Bed/Chair Transfer: 3: Sit > Supine: Mod A (lifting assist/Pt. 50-74%/lift 2 legs);4: Chair or W/C > Bed: Min A (steadying Pt. > 75%);5: Bed > Chair or W/C: Supervision (verbal cues/safety issues);4: Supine > Sit: Min A (steadying Pt. > 75%/lift 1 leg)  FIM - Locomotion: Wheelchair Distance: 45 Locomotion: Wheelchair: 2: Travels 50 - 149 ft with supervision, cueing or coaxing FIM - Locomotion: Ambulation Locomotion: Ambulation:  (pt unable to perform on eval)  Comprehension Comprehension Mode: Auditory Comprehension: 6-Follows complex conversation/direction: With extra time/assistive device  Expression Expression Mode: Verbal Expression: 6-Expresses complex ideas: With extra time/assistive device  Social Interaction Social Interaction: 6-Interacts appropriately with others with medication or extra time (anti-anxiety, antidepressant).  Problem Solving Problem Solving: 6-Solves complex problems: With extra time  Memory Memory: 6-More than reasonable amt of time  Medical Problem List and Plan:  1. Right AKA secondary to gangrenous changes 01/11/2014   -stump sock for protection/hygiene 2. DVT Prophylaxis/Anticoagulation: Subcutaneous Lovenox.   3. Pain Management: Oxycodone changed to hydrocodone due to potential nausea SE. 4. Neuropsych: This patient is capable of making decisions on her own behalf.  5. Acute blood loss anemia. Followup hgb 9.8, might have bleeding hemorrhoid-   6. Chronic renal insufficiency. Baseline creatinine 3.48-3.76.    -potassium high this am with addnl supplement---15g kayexalate today 7. History of atrial fibrillation with cardioversion. Cardiac rate control. No chest pain or shortness of breath  8. Hypertension. Cozaar 50 mg daily, Zaroxolyn 2.5 mg  every other day, Demadex 140 mg daily.   -tight control 9. Hypothyroidism. Synthroid  10. COPD. Wore oxygen at night at home  -dc during the day as able 11. GERD. Protonix, + BM, simethicone for gas  -nausea and bloating apparently chronic---extensive work up per family (daughters states she's seen dr. Fuller Plan)   -not sure what else is to be gained by follow up----will contact them and see  -KUB with diffuse atherosclerosis     LOS (Days) 8 A FACE TO FACE EVALUATION WAS PERFORMED  SWARTZ,ZACHARY T 01/22/2014 8:26 AM

## 2014-01-22 NOTE — Consult Note (Signed)
Referring Provider:  Dr. Naaman Plummer Primary Care Physician:  Unice Cobble, MD Primary Gastroenterologist:  Dr. Fuller Plan  Reason for Consultation:  Nausea, bloating, abdominal pain  HPI: [name] is a 80 y.o. female with history of atrial fibrillation status post cardioversion, restrictive cardiomyopathy, CVA 1999 with mild left-sided weakness and chronic renal insufficiency. Admitted on 01/11/2014 with right lower extremity pain as well as right medial leg ulcers with serous drainage. Patient underwent recent angiographic procedures for evaluation of right lower extremity pain showing severe proximal stenosis and poor runoff. Limb was not felt to be salvageable. Underwent right above-knee amputation 01/11/2014 per Dr. Bridgett Larsson.  While she has been in rehab she complains of abdominal pain, nausea, and bloating.  GI was asked to see her.  Abdominal x-ray reveals severe atherosclerosis.  She has been followed by Dr. Fuller Plan in that past (last seen in 2012) for history of PUD , small bowel AVMs and hemorrhoids. Patient was hospitalized in Sept. 2011 for UGIB. She was found to have multiple antral ulcers and one small duodenal AVM. Since then she has undergone a repeat EGD 12/2010 (with healed ulcers, moderate gastritis, and HH), colonoscopy 10/2010 (with multiple adenomatous polyps removed, diverticulosis, internal and external hemorrhoids, and a lipoma in the descending colon) and small bowel video capsule 11/2010 (with two tiny small bowel AVM's).  She tells me that her abdominal pain is always located in the lower abdomen and has been present for a couple of months or so.  Is not present every day, but most days.  Usually worse after breakfast in the morning, but the severity is usually dependent on how much she eats.  Says that she does not eat much because of the pain.  She says that the pain seems to get a little better when she moves her bowels.  Stools are not hard, but she has difficulty passing them.   No diarrhea.  Sees bright red blood with BM's on occasion, which she contributes to hemorrhoids (uses hydrocortisone suppositories at home prn).  Has some nausea and occasional vomiting with the pain as well.  She says that she has lost quite a bit of weight over the past year or more.   Past Medical History  Diagnosis Date  . Restrictive cardiomyopathy   . Atrial fibrillation 1996    S/P cardioversion  . CVA (cerebral infarction) 1999  . CPAP (continuous positive airway pressure) dependence   . Anemia     NOS  . Iron deficiency   . Gout   . Peptic ulcer, acute with hemorrhage 2005, 2011  . Hypertension   . Hypothyroidism   . Diastolic dysfunction   . Adenomatous colon polyp   . GERD (gastroesophageal reflux disease)   . Hiatal hernia   . Diverticulosis   . Anxiety disorder   . Arthritis   . Internal and external hemorrhoids without complication   . Morbid obesity   . CAD (coronary artery disease) 08/06/2008    One vessel by cath, EF overall preserved by echo 04/30/2008  . On home oxygen therapy   . Renal failure     Dr. Hassell Done  . Hyperlipidemia   . CHF (congestive heart failure)   . Complication of anesthesia     pt states she was sore "all-over" for over a week after anesthesia  . COAD (chronic obstructive airways disease) 2007    Exacerbation  . COPD (chronic obstructive pulmonary disease)     oxygen prn  . Sleep apnea  cpap - no longer uses  . Stroke     x2 -- effected vision; left sided slightly weak  . Rash     on upper extremities  . Bowel trouble     having constipation and diarrhea  . Family history of anesthesia complication     daughter had same "sore all over" experience with anesthesia    Past Surgical History  Procedure Laterality Date  . Appendectomy    . Oophorectomy    . Incision and drainage of cellulitis  82/5053    Umbilical abcess  . Cardiac catheterization      Pulmonary HTN, non obstructive CAD  . Mole removal      on leg  . Vaginal  hysterectomy    . Colonoscopy    . Bascilic vein transposition Left     Left upper arm  . Amputation Right 01/11/2014    Procedure: AMPUTATION ABOVE KNEE-RIGHT;  Surgeon: Conrad Tavernier, MD;  Location: [facility];  Service: Vascular;  Laterality: Right;    Prior to Admission medications   Medication Sig Start Date End Date Taking? Authorizing Provider  acetaminophen (TYLENOL) 325 MG tablet Take 650 mg by mouth every 4 (four) hours as needed. For pain.   Yes Historical Provider, MD  allopurinol (ZYLOPRIM) 100 MG tablet Take 100 mg by mouth every other day.   Yes Historical Provider, MD  calcitRIOL (ROCALTROL) 0.5 MCG capsule Take 0.5 mcg by mouth every other day.    Yes Historical Provider, MD  epoetin alfa (PROCRIT) 97673 UNIT/ML injection Inject 40,000 Units into the skin once a week.    Yes Historical Provider, MD  HYDROcodone-acetaminophen (NORCO/VICODIN) 5-325 MG per tablet Take 1 tablet by mouth every 6 (six) hours as needed. 01/07/14  Yes Elam Dutch, MD  hydrocortisone (ANUSOL-HC) 25 MG suppository Place 1 suppository (25 mg total) rectally at bedtime as needed. For hemorrhoids. Use for 10 days. 09/15/13  Yes Ladene Artist, MD  levothyroxine (SYNTHROID, LEVOTHROID) 75 MCG tablet Take 75 mcg by mouth daily before breakfast.   Yes Historical Provider, MD  losartan (COZAAR) 100 MG tablet Take 50 mg by mouth daily.   Yes Historical Provider, MD  metolazone (ZAROXOLYN) 2.5 MG tablet Take 2.5 mg by mouth every other day.   Yes Historical Provider, MD  pantoprazole (PROTONIX) 40 MG tablet Take 40 mg by mouth daily.   Yes Historical Provider, MD  torsemide (DEMADEX) 20 MG tablet Take 140 mg by mouth daily.   Yes Historical Provider, MD  traMADol (ULTRAM) 50 MG tablet Take 25-50 mg by mouth every 8 (eight) hours as needed (for pain).   Yes Historical Provider, MD  vitamin C (ASCORBIC ACID) 500 MG tablet Take 500 mg by mouth daily.     Yes Historical Provider, MD  potassium chloride 40 MEQ/15ML (20%)  LIQD TAKE 15 ML BY MOUTH TWICE DAILY 01/18/14   Hendricks Limes, MD    Current Facility-Administered Medications  Medication Dose Route Frequency Provider Last Rate Last Dose  . acetaminophen (TYLENOL) tablet 325-650 mg  325-650 mg Oral Q4H PRN Lavon Paganini Angiulli, PA-C   650 mg at 01/20/14 1003   Or  . acetaminophen (TYLENOL) suppository 325-650 mg  325-650 mg Rectal Q4H PRN Lavon Paganini Angiulli, PA-C      . allopurinol (ZYLOPRIM) tablet 100 mg  100 mg Oral Q48H Daniel J Angiulli, PA-C   100 mg at 01/21/14 4193  . bisacodyl (DULCOLAX) suppository 10 mg  10 mg Rectal Daily PRN  Lavon Paganini Angiulli, PA-C      . calcitRIOL (ROCALTROL) capsule 0.5 mcg  0.5 mcg Oral Q48H Daniel J Angiulli, PA-C   0.5 mcg at 01/22/14 0753  . darbepoetin (ARANESP) injection 25 mcg  25 mcg Subcutaneous Q7 days Cathlyn Parsons, PA-C   25 mcg at 01/20/14 2128  . feeding supplement (RESOURCE BREEZE) (RESOURCE BREEZE) liquid 1 Container  1 Container Oral Q24H Elonda Husky, RD   1 Container at 01/21/14 1441  . HYDROcodone-acetaminophen (NORCO/VICODIN) 5-325 MG per tablet 1-2 tablet  1-2 tablet Oral Q6H PRN Meredith Staggers, MD   2 tablet at 01/22/14 0254  . hydrocortisone (ANUSOL-HC) 2.5 % rectal cream   Rectal TID Lavon Paganini Angiulli, PA-C      . levothyroxine (SYNTHROID, LEVOTHROID) tablet 75 mcg  75 mcg Oral QAC breakfast Lavon Paganini Angiulli, PA-C   75 mcg at 01/22/14 4259  . losartan (COZAAR) tablet 50 mg  50 mg Oral Daily Lavon Paganini Angiulli, PA-C   50 mg at 01/22/14 0753  . metolazone (ZAROXOLYN) tablet 2.5 mg  2.5 mg Oral Q48H Daniel J Angiulli, PA-C   2.5 mg at 01/21/14 5638  . ondansetron (ZOFRAN) tablet 4 mg  4 mg Oral Q6H PRN Lavon Paganini Angiulli, PA-C   4 mg at 01/20/14 1624   Or  . ondansetron (ZOFRAN) injection 4 mg  4 mg Intravenous Q6H PRN Lavon Paganini Angiulli, PA-C      . pantoprazole (PROTONIX) EC tablet 40 mg  40 mg Oral Daily Lavon Paganini Angiulli, PA-C   40 mg at 01/22/14 0753  . phenol (CHLORASEPTIC) mouth spray 1 spray   1 spray Mouth/Throat PRN Lavon Paganini Angiulli, PA-C      . potassium chloride SA (K-DUR,KLOR-CON) CR tablet 20 mEq  20 mEq Oral Daily Lavon Paganini Angiulli, PA-C   20 mEq at 01/22/14 0753  . senna-docusate (Senokot-S) tablet 1 tablet  1 tablet Oral QHS PRN Lavon Paganini Angiulli, PA-C      . simethicone (MYLICON) chewable tablet 80 mg  80 mg Oral QID PRN Cassandria Anger, MD   80 mg at 01/20/14 1624  . sorbitol 70 % solution 30 mL  30 mL Oral Daily PRN Lavon Paganini Angiulli, PA-C      . torsemide [facility]) tablet 140 mg  140 mg Oral Daily Lavon Paganini Angiulli, PA-C   140 mg at 01/22/14 0753  . vitamin C (ASCORBIC ACID) tablet 500 mg  500 mg Oral Daily Lavon Paganini Angiulli, PA-C   500 mg at 01/22/14 0753  . white petrolatum (VASELINE) gel   Topical PRN Lavon Paganini Angiulli, PA-C        Allergies as of 01/14/2014 - Review Complete 01/14/2014  Allergen Reaction Noted  . Adhesive [tape]  01/14/2014  . Aspirin Other (See Comments) 01/06/2014    Family History  Problem Relation Age of Onset  . Breast cancer Mother   . Cancer Mother     breast  . Heart disease Father     MI  . Heart attack Father   . Breast cancer Sister   . Cancer Sister     breast  . Peripheral vascular disease Sister   . Heart disease Brother     CABG 5 vessel  . Rheum arthritis Brother   . Colon cancer Neg Hx     History   Social History  . Marital Status: Married    Spouse Name: N/A    Number of Children: N/A  . Years  of Education: N/A   Occupational History  . Retired Economist   Social History Main Topics  . Smoking status: Never Smoker   . Smokeless tobacco: Never Used  . Alcohol Use: No  . Drug Use: No  . Sexual Activity: Not on file   Other Topics Concern  . Not on file   Social History Narrative   Married   One child   No regular exercise   Oxygen dependent    Daily caffeine use: one daily      06/27/2010 - Designated party form signed appointing spouse Darris Gambler or daughter Patrick North;  OK to leave message on home answering machine at [phone]; Jackelyn Poling [phone]    Review of Systems: Ten point ROS is O/W negative except as mentioned in HPI.  Physical Exam: Vital signs in last 24 hours: Temp:  [97.4 F (36.3 C)-97.8 F (36.6 C)] 97.4 F (36.3 C) (02/13 0542) Pulse Rate:  [65-88] 65 (02/13 0751) Resp:  [17-18] 18 (02/13 0542) BP: (91-129)/(54-59) 129/57 mmHg (02/13 0751) SpO2:  [95 %-98 %] 98 % (02/13 0542) Last BM Date: 01/21/14 General:  Alert, frail, pleasant and cooperative in NAD Head:  Normocephalic and atraumatic. Eyes:  Sclera clear, no icterus.   Conjunctiva pink. Ears:  Normal auditory acuity. Mouth:  No deformity or lesions.   Lungs:  Clear throughout to auscultation.  No wheezes, crackles, or rhonchi.  Heart:  Regular rate and rhythm; no murmurs, clicks, rubs,  or gallops. Abdomen:  Soft, non-distended.  BS present.  Minimal TTP in lower abdomen.   Rectal:  Deferred  Msk:  Symmetrical without gross deformities. Extremities:  Without clubbing or edema.  Right AKA. Neurologic:  Alert and  oriented x4;  grossly normal neurologically. Skin:  Intact without significant lesions or rashes. Psych:  Alert and cooperative. Normal mood and affect.  Intake/Output from previous day: 02/12 0701 - 02/13 0700 In: 650 [P.O.:650] Out: 3 [Urine:2; Stool:1] Intake/Output this shift: Total I/O In: 240 [P.O.:240] Out: -   Lab Results:  Recent Labs  01/20/14 0525  WBC 4.8  HGB 9.0*  HCT 29.6*  PLT 204   BMET  Recent Labs  01/20/14 0525 01/20/14 1440 01/21/14 0600  NA 143 140 143  K 6.2* 4.8 4.0  CL 105 98 101  CO2 25 26 28   GLUCOSE 95 83 84  BUN 100* 99* 96*  CREATININE 2.48* 2.51* 2.55*  CALCIUM 9.1 9.4 9.0    IMPRESSION:  -Post-prandial abdominal pains/nausea/gas and bloating:  Symptoms mostly after breakfast and related to the amount that she eats.  Patient says that symptoms have been present for a few months, but reports say that patient  was evaluated for these symptoms in the past by Dr. Fuller Plan (she has not had GI evaluation in about 3 years).  ? If her symptoms are related to constipation.  Also, she has severe atherosclerosis seen on abdominal x-ray she may have some chronic mesenteric ischemia (unfortunately if this is what is causing her pain then there is not much medically that we can do). -History of PUD.  On pantoprazole daily. -Right AKA secondary to gangrenous changes 01/11/2014 -Chronic renal insufficiency. Baseline creatinine 3.48-3.76.  -History of atrial fibrillation with cardioversion. -Hypertension -Hypothyroidism -COPD  PLAN: -Will start her on daily Miralax. -Further plans per Dr. Carlean Purl. -? Need for further imaging (CT scan or CT angio).   ZEHR, JESSICA D.  01/22/2014, 9:47 AM  Pager number [phone]  [facility] Attending  I  have also seen and assessed the patient and agree with the above note.  Her story is consistent with chronic mesenteric ischemia. Pain and 30+# weight loss ( I reviewed chart) in last year.   She had an abdominal angiogram earlier this month - it showed suspected stenosis of proximal SMA and a patent celiac a but calcifications in branches IMA not mentioned We know she has atherosclerosis everywhere  Will see if MiraLax helps her feel any better though she is not particulary constipated she does struggle with some straining to stool and incomplete defecation.  We will check on her Monday.  Gatha Mayer, MD, [facility] [phone] (pager) 01/22/2014 4:07 PM

## 2014-01-22 NOTE — Progress Notes (Signed)
Physical Therapy Session Note  Patient Details  Name: [name] MRN: [id number] Date of Birth: November 21, 1929  Today's Date: 01/22/2014 Time First Session: 2751-7001 Time Calculation (min): 40 min  Skilled Therapeutic Interventions/Progress Updates:    Session focused on sliding board transfers, w/c and sliding board set-up. W/c set-up for transfer with min assist, mod verbal cues. Slightly unlevel sliding board transfer to Lt, pt requires mod assist for board placement. Lateral scooting along mat to Rt/Lt with PT facilitating and providing verbal cues for anterior pelvic tilt, anterior weight shift, and head/hips relationship. Sliding board transfer to Rt uphill promoting carryover of task. Reviewed lateral leans for pressure relief, family member in room educated on reminding pt to perform pressure relief every 30 min.   Wheelchair propulsion controlled environment x 100' with bil UEs, Lt LE for strengthening - supervision.   Second Session Time:  7494-4967 Time Calculation (min): 27 min Skilled Therapeutic Interventions/Progress Updates:  Session starting late due to meeting with other medical professional (GI). Sit <> stands w/c>RW with mod/max assist x 4 reps to promote improved anterior weight shift and Lt LE contribution during transfers. Once in standing pt only requires min assist for balance, engaged in active Rt hip extension x 10 reps each stand. Sliding board transfer w/c to bed with min assist, improving ability to anteriorly weight shift and lift buttocks, mod verbal cues for UE placement, PT to place and remove board.    Therapy Documentation Precautions:  Precautions Precautions: Fall Restrictions Weight Bearing Restrictions: Yes RLE Weight Bearing: Non weight bearing Pain: Pain Assessment First session Pain Assessment: No/denies pain Pain Score: 3  Pain Type: Acute pain Pain Location: Abdomen (rt knee) Pain Intervention(s): RN provided medication  Pain  Assessment Second Session:  No c/o pain  See FIM for current functional status  Therapy/Group: Individual Therapy both sessions  Lahoma Rocker 01/22/2014, 12:19 PM

## 2014-01-22 NOTE — Progress Notes (Signed)
Physical Therapy Session Note  Patient Details  Name: [name] MRN: [id number] Date of Birth: 10-07-1929  Today's Date: 01/22/2014 Time: 6073-7106 Time Calculation (min): 54 min  Short Term Goals: Week 2:  PT Short Term Goal 1 (Week 2): =LTG  Skilled Therapeutic Interventions/Progress Updates:    Session consisted of pt performing w/c mobility from her room to the end of the hallway and again from the rehab gym to her room with S (required cueing in order to avoid hitting objects) with the use of both her UE's and LLE in order to improve functional independence, performing slideboard transfers with S in order to transfer to a lower surface and Min A in order to transfer to an elevated surface (verbal cueing also still needed throughout in order for pt to properly perform anterior weight shift and for proper hand placement when performing this task), bed mobility with Min A (verbal cueing needed in order for pt to properly use her LUE when performing supine to sit), balance task where pt was asked to reach outside her BOS to grab a horseshoe (pt performed this task with S) in order to improve pt's dynamic sitting balance, and pt performing mat scoots at the edge of the mat with S in order to improve pt's bed mobility. A family education date of 2/16 was also discussed with pt and her husband with both of them saying that their daughter would also be present for this session.   Therapy Documentation Precautions:  Precautions Precautions: Fall Restrictions Weight Bearing Restrictions: Yes RLE Weight Bearing: Non weight bearing  Pain:  Pt had no c/o pain during the PT session  See FIM for current functional status  Therapy/Group: Individual Therapy  Fraidy Mccarrick 01/22/2014, 2:52 PM

## 2014-01-22 NOTE — Progress Notes (Signed)
Occupational Therapy Weekly Progress Note & Session Note  Patient Details  Name: [name] MRN: [id number] Date of Birth: 05/18/1929  Today's Date: 01/22/2014  SESSION NOTE Time: 6433-2951 Time Calculation (min): 55 min Individual Therapy Patient with no complaints of pain Upon entering room, patient found seated on BSC beside bed. Patient transferred > EOB with moderate assistance. From here, UB/LB bathing & dressing completed in supine and seated EOB positions; set-up for UB bathing & dressing, set-up for LB bathing, and mod assist for LB dressing. Patient used reacher and long handled sponge to increase independence with LB ADLs. From here, patient transferred EOB> w/c using slide board with steady assist. Patient then sat at sink for grooming tasks of brushing teeth and brushing hair. Patient takes more than a reasonable amount of time to complete tasks. Patient left seated at sink at end of session and NT present to assist with patient's toileting needs.   -----------------------------------------------------------------------------------------------------------------------------------  WEEKLY PROGRESS NOTE Patient has met 4 of 4 short term goals.  Patient is making steady progress on CIR. Patient has been completing functional transfers with mod assist (squat>stand pivot techniques). Have recently decided to educate patient on slide board transfers in order to increase her independence with transfers and decrease burden of care on caregivers. Patient with decreased activity tolerance/endurance and therapists believe slide board transfers will be more conducive and appropriate for her at this time. Patient's husband has been present for most therapy sessions and other family members have been present at times. Patient plans to have family assising her 24/7. Anticipated d/c date = 2/17 > home with 24/7 supervision > min assist for ADLs and functional transfers.  Patient continues to  demonstrate the following deficits: decreased overall activity tolerance/endurance, decreased independence with ADL tasks, decreased independence with functional mobility, decreased independence with functional transfers. Therefore, patient will continue to benefit from skilled OT intervention to enhance overall performance with BADL and Reduce care partner burden.  Some goals have been upgraded or downgraded, see Care Plan/Paths for set OT LTGs.   OT Short Term Goals Week 1:  OT Short Term Goal 1 (Week 1): Patient will complete upper body bathing with setup assist OT Short Term Goal 1 - Progress (Week 1): Met OT Short Term Goal 2 (Week 1): Patient will complete lower body bathing with mod assist OT Short Term Goal 2 - Progress (Week 1): Met OT Short Term Goal 3 (Week 1): Patient will complete dressing with mod assist OT Short Term Goal 3 - Progress (Week 1): Met OT Short Term Goal 4 (Week 1): Patient will complete transfer from w/c to [facility] and back with mod assist OT Short Term Goal 4 - Progress (Week 1): Met  Week 2:  OT Short Term Goal 1 (Week 2): Short Term Goals = Long Term Goals  Skilled Therapeutic Interventions/Progress Updates:  Patient/family education;Pain management;Self Care/advanced ADL retraining;Therapeutic Activities;Therapeutic Exercise;UE/LE Strength taining/ROM;DME/adaptive equipment instruction;Balance/vestibular training;Discharge planning;Wheelchair propulsion/positioning;Community reintegration;Psychosocial support;Skin care/wound managment;Functional mobility training;UE/LE Coordination activities   Precautions:  Precautions Precautions: Fall Restrictions Weight Bearing Restrictions: Yes RLE Weight Bearing: Non weight bearing  ADL: ADL ADL Comments: see FIM  See FIM for current functional status  Alishea Beaudin 01/22/2014, 9:53 AM

## 2014-01-22 NOTE — Progress Notes (Signed)
Reviewed and in agreement with treatment provided.  

## 2014-01-23 ENCOUNTER — Inpatient Hospital Stay (HOSPITAL_COMMUNITY): Payer: Medicare Other | Admitting: Occupational Therapy

## 2014-01-23 DIAGNOSIS — S78119A Complete traumatic amputation at level between unspecified hip and knee, initial encounter: Secondary | ICD-10-CM

## 2014-01-23 DIAGNOSIS — L98499 Non-pressure chronic ulcer of skin of other sites with unspecified severity: Secondary | ICD-10-CM

## 2014-01-23 DIAGNOSIS — I739 Peripheral vascular disease, unspecified: Secondary | ICD-10-CM

## 2014-01-23 NOTE — Progress Notes (Signed)
Subjective/Complaints: Discussed GI recs with pt and husband no new issues noted) A 12 point review of systems has been performed and if not noted above is otherwise negative.   Objective: Vital Signs: Blood pressure 127/50, pulse 81, temperature 97.6 F (36.4 C), temperature source Oral, resp. rate 16, weight 48.7 kg (107 lb 5.8 oz), SpO2 97.00%. No results found. No results found for this basename: WBC, HGB, HCT, PLT,  in the last 72 hours  Recent Labs  01/20/14 1440 01/21/14 0600  NA 140 143  K 4.8 4.0  CL 98 101  GLUCOSE 83 84  BUN 99* 96*  CREATININE 2.51* 2.55*  CALCIUM 9.4 9.0   CBG (last 3)  No results found for this basename: GLUCAP,  in the last 72 hours  Wt Readings from Last 3 Encounters:  01/20/14 48.7 kg (107 lb 5.8 oz)  01/12/14 49.3 kg (108 lb 11 oz)  01/12/14 49.3 kg (108 lb 11 oz)    Physical Exam:  Constitutional: She is oriented to person, place, and time.  Frail appearing. HENT:  Head: Normocephalic.  Eyes: EOM are normal.  Neck: Normal range of motion. Neck supple. No thyromegaly present.  Cardiovascular: Normal rate and regular rhythm. No murmur Respiratory: Effort normal and breath sounds normal. No respiratory distress.  GI: Soft. Bowel sounds are normal. She exhibits no distension. minimally-tender Musculoskeletal: Left upper arm fistula  Left foot no pain to palpation , no pain with AROM Neurological: She is alert and oriented to person, place, and time.  Follow simple commands. UE's 5/5. LLE is 3+ HF, KE, 4- at ankle Skin:  High right AKA incision well approximated, tender.dressing intact  Psychiatric: She has a normal mood and affect. Her behavior is normal. pleasant   Assessment/Plan: 1. Functional deficits secondary to Right AKA which require 3+ hours per day of interdisciplinary therapy in a comprehensive inpatient rehab setting. Physiatrist is providing close team supervision and 24 hour management of active medical  problems listed below. Physiatrist and rehab team continue to assess barriers to discharge/monitor patient progress toward functional and medical goals.    FIM: FIM - Bathing Bathing Steps Patient Completed: Chest;Abdomen;Left Arm;Right upper leg;Right Arm;Left upper leg;Left lower leg (including foot);Front perineal area;Buttocks Bathing: 5: Set-up assist to: Open items  FIM - Upper Body Dressing/Undressing Upper body dressing/undressing steps patient completed: Pull shirt over trunk;Thread/unthread left sleeve of pullover shirt/dress;Thread/unthread right sleeve of pullover shirt/dresss;Hook/unhook bra;Thread/unthread left bra strap;Thread/unthread right bra strap;Put head through opening of pull over shirt/dress Upper body dressing/undressing: 5: Set-up assist to: Obtain clothing/put away FIM - Lower Body Dressing/Undressing Lower body dressing/undressing steps patient completed: Thread/unthread right pants leg;Thread/unthread left pants leg Lower body dressing/undressing: 3: Mod-Patient completed 50-74% of tasks  FIM - Toileting Toileting steps completed by patient: Performs perineal hygiene Toileting Assistive Devices: Grab bar or rail for support Toileting: 2: Max-Patient completed 1 of 3 steps  FIM - Radio producer Devices: Recruitment consultant Transfers: 3-From toilet/BSC: Mod A (lift or lower assist)  FIM - Control and instrumentation engineer Devices: Sliding board Bed/Chair Transfer: 4: Chair or W/C > Bed: Min A (steadying Pt. > 75%);4: Sit > Supine: Min A (steadying pt. > 75%/lift 1 leg)  FIM - Locomotion: Wheelchair Distance: 45 Locomotion: Wheelchair: 2: Travels 50 - 149 ft with supervision, cueing or coaxing FIM - Locomotion: Ambulation Locomotion: Ambulation:  (pt unable to perform on eval)  Comprehension Comprehension Mode: Auditory Comprehension: 6-Follows complex  conversation/direction: With extra time/assistive  device  Expression Expression Mode: Verbal Expression: 6-Expresses complex ideas: With extra time/assistive device  Social Interaction Social Interaction: 6-Interacts appropriately with others with medication or extra time (anti-anxiety, antidepressant).  Problem Solving Problem Solving: 6-Solves complex problems: With extra time  Memory Memory: 6-More than reasonable amt of time  Medical Problem List and Plan:  1. Right AKA secondary to gangrenous changes 01/11/2014   -stump sock for protection/hygiene 2. DVT Prophylaxis/Anticoagulation: Subcutaneous Lovenox.   3. Pain Management: Oxycodone changed to hydrocodone due to potential nausea SE. 4. Neuropsych: This patient is capable of making decisions on her own behalf.  5. Acute blood loss anemia. Followup hgb 9.8, might have bleeding hemorrhoid-   6. Chronic renal insufficiency. Baseline creatinine 3.48-3.76.    -potassium high this am with addnl supplement---15g kayexalate today 7. History of atrial fibrillation with cardioversion. Cardiac rate control. No chest pain or shortness of breath  8. Hypertension. Cozaar 50 mg daily, Zaroxolyn 2.5 mg every other day, Demadex 140 mg daily.   -tight control 9. Hypothyroidism. Synthroid  10. COPD. Wore oxygen at night at home  -dc during the day as able 11. GERD. Protonix, + BM, simethicone for gas  Chronic mesenteric ischemia, diffuse, no specific tx recs, Miralax ordered LOS (Days) 9 A FACE TO FACE EVALUATION WAS PERFORMED  Charlett Blake 01/23/2014 8:53 AM

## 2014-01-23 NOTE — Progress Notes (Signed)
Occupational Therapy Session Note  Patient Details  Name: [name] MRN: [id number] Date of Birth: 01-11-1929  Today's Date: 01/23/2014 Time: 1300-1330 Time Calculation (min): 30 min  Short Term Goals: Week 1:  OT Short Term Goal 1 (Week 1): Patient will complete upper body bathing with setup assist OT Short Term Goal 1 - Progress (Week 1): Met OT Short Term Goal 2 (Week 1): Patient will complete lower body bathing with mod assist OT Short Term Goal 2 - Progress (Week 1): Met OT Short Term Goal 3 (Week 1): Patient will complete dressing with mod assist OT Short Term Goal 3 - Progress (Week 1): Met OT Short Term Goal 4 (Week 1): Patient will complete transfer from w/c to [facility] and back with mod assist OT Short Term Goal 4 - Progress (Week 1): Met  Week 2:  OT Short Term Goal 1 (Week 2): Short Term Goals = Long Term Goals  Skilled Therapeutic Interventions/Progress Updates:  Upon entering room, patient found seated on BSC. NT present to change dressing. From here, NT stood with patient while daughter assisted with clothing management. From here, patient sat in w/c. Focused skilled intervention on education> patient's daughter, Jackelyn Poling. Patient and debbie completed stand pivot transfer w/c <>bed and slide board transfer w/c<>bed. Debbie independent to assist patient with both transfer methods. Therapist checked Jackelyn Poling off on safety plan for toilet transfers. Discussed recommended transfer method for home use. At end of session, left patient seated in w/c at sink to complete grooming tasks with family (daughter & husband) in room.   Precautions:  Precautions Precautions: Fall Restrictions Weight Bearing Restrictions: Yes RLE Weight Bearing: Non weight bearing  See FIM for current functional status  Therapy/Group: Individual Therapy  Haasini Patnaude 01/23/2014, 1:32 PM

## 2014-01-24 ENCOUNTER — Inpatient Hospital Stay (HOSPITAL_COMMUNITY): Payer: Medicare Other | Admitting: Physical Therapy

## 2014-01-24 MED ORDER — LOSARTAN POTASSIUM 25 MG PO TABS
25.0000 mg | ORAL_TABLET | Freq: Every day | ORAL | Status: DC
  Administered 2014-01-26 – 2014-01-27 (×2): 25 mg via ORAL
  Filled 2014-01-24 (×4): qty 1

## 2014-01-24 NOTE — Progress Notes (Signed)
Subjective/Complaints: BPs running 90-100, RN concerned about Hydrocodone Had bowel constipation relieved by miralax and dulcolax supp A 12 point review of systems has been performed and if not noted above is otherwise negative.   Objective: Vital Signs: Blood pressure 98/40, pulse 61, temperature 97.7 F (36.5 C), temperature source Oral, resp. rate 17, weight 48.7 kg (107 lb 5.8 oz), SpO2 94.00%. No results found. No results found for this basename: WBC, HGB, HCT, PLT,  in the last 72 hours No results found for this basename: NA, K, CL, CO, GLUCOSE, BUN, CREATININE, CALCIUM,  in the last 72 hours CBG (last 3)  No results found for this basename: GLUCAP,  in the last 72 hours  Wt Readings from Last 3 Encounters:  01/20/14 48.7 kg (107 lb 5.8 oz)  01/12/14 49.3 kg (108 lb 11 oz)  01/12/14 49.3 kg (108 lb 11 oz)    Physical Exam:  Constitutional: She is oriented to person, place, and time.  Frail appearing. HENT:  Head: Normocephalic.  Eyes: EOM are normal.  Neck: Normal range of motion. Neck supple. No thyromegaly present.  Cardiovascular: Normal rate and regular rhythm. No murmur Respiratory: Effort normal and breath sounds normal. No respiratory distress.  GI: Soft. Bowel sounds are normal. She exhibits no distension. minimally-tender Musculoskeletal: Left upper arm fistula  Left foot no pain to palpation , no pain with AROM Neurological: She is alert and oriented to person, place, and time.  Follow simple commands. UE's 5/5. LLE is 3+ HF, KE, 4- at ankle Skin:  High right AKA incision well approximated, tender.dressing intact  Psychiatric: She has a normal mood and affect. Her behavior is normal. pleasant   Assessment/Plan: 1. Functional deficits secondary to Right AKA which require 3+ hours per day of interdisciplinary therapy in a comprehensive inpatient rehab setting. Physiatrist is providing close team supervision and 24 hour management of active medical  problems listed below. Physiatrist and rehab team continue to assess barriers to discharge/monitor patient progress toward functional and medical goals.    FIM: FIM - Bathing Bathing Steps Patient Completed: Chest;Abdomen;Left Arm;Right upper leg;Right Arm;Left upper leg;Left lower leg (including foot);Front perineal area;Buttocks Bathing: 5: Set-up assist to: Open items  FIM - Upper Body Dressing/Undressing Upper body dressing/undressing steps patient completed: Pull shirt over trunk;Thread/unthread left sleeve of pullover shirt/dress;Thread/unthread right sleeve of pullover shirt/dresss;Hook/unhook bra;Thread/unthread left bra strap;Thread/unthread right bra strap;Put head through opening of pull over shirt/dress Upper body dressing/undressing: 5: Set-up assist to: Obtain clothing/put away FIM - Lower Body Dressing/Undressing Lower body dressing/undressing steps patient completed: Thread/unthread right pants leg;Thread/unthread left pants leg Lower body dressing/undressing: 3: Mod-Patient completed 50-74% of tasks  FIM - Toileting Toileting steps completed by patient: Performs perineal hygiene Toileting Assistive Devices: Grab bar or rail for support Toileting: 2: Max-Patient completed 1 of 3 steps  FIM - Radio producer Devices: Recruitment consultant Transfers: 3-To toilet/BSC: Mod A (lift or lower assist)  FIM - Control and instrumentation engineer Devices: Sliding board Bed/Chair Transfer: 4: Chair or W/C > Bed: Min A (steadying Pt. > 75%);4: Sit > Supine: Min A (steadying pt. > 75%/lift 1 leg)  FIM - Locomotion: Wheelchair Distance: 45 Locomotion: Wheelchair: 2: Travels 50 - 149 ft with supervision, cueing or coaxing FIM - Locomotion: Ambulation Locomotion: Ambulation:  (pt unable to perform on eval)  Comprehension Comprehension Mode: Auditory Comprehension: 6-Follows complex conversation/direction: With extra time/assistive  device  Expression Expression Mode: Verbal Expression: 6-Expresses  complex ideas: With extra time/assistive device  Social Interaction Social Interaction: 6-Interacts appropriately with others with medication or extra time (anti-anxiety, antidepressant).  Problem Solving Problem Solving: 6-Solves complex problems: With extra time  Memory Memory: 6-More than reasonable amt of time  Medical Problem List and Plan:  1. Right AKA secondary to gangrenous changes 01/11/2014   -stump sock for protection/hygiene 2. DVT Prophylaxis/Anticoagulation: Subcutaneous Lovenox.   3. Pain Management: Oxycodone changed to hydrocodone due to potential nausea SE. 4. Neuropsych: This patient is capable of making decisions on her own behalf.  5. Acute blood loss anemia. Followup hgb 9.8, might have bleeding hemorrhoid-   6. Chronic renal insufficiency. Baseline creatinine 3.48-3.76.    -potassium high this am with addnl supplement---15g kayexalate today 7. History of atrial fibrillation with cardioversion. Cardiac rate control. No chest pain or shortness of breath  8. Hypertension. Cozaar 50 mg daily, systolic on low side will reduce to 25 mg, Zaroxolyn 2.5 mg every other day, Demadex 140 mg daily.   -tight control 9. Hypothyroidism. Synthroid  10. COPD. Wore oxygen at night at home  -dc during the day as able 11. GERD. Protonix, + BM, simethicone for gas  Chronic mesenteric ischemia, diffuse, no specific tx recs, Miralax ordered LOS (Days) 10 A FACE TO FACE EVALUATION WAS PERFORMED  Levaeh Vice E 01/24/2014 10:18 AM

## 2014-01-24 NOTE — Progress Notes (Signed)
Physical Therapy Session Note  Patient Details  Name: [name] MRN: [id number] Date of Birth: 1928/12/27  Today's Date: 01/24/2014 Time: 1100-1153 Time Calculation (min): 53 min  Short Term Goals: Week 1:  PT Short Term Goal 1 (Week 1): Pt will perform functional transfers with min A PT Short Term Goal 1 - Progress (Week 1): Met PT Short Term Goal 2 (Week 1): Pt will tolerate standing balance for functional task x 1 minute with min A PT Short Term Goal 2 - Progress (Week 1): Not met (pt unable to tolerate static stance for more than 20 seconds with Min A and use of a RW)  Skilled Therapeutic Interventions/Progress Updates:  Pt was seen bedside in the am, initially reluctant to participate secondary to GI issues but agreed with encouragement. Pt transferred sit to stand from commode with max A, tolerated standing about 30 seconds to assist with pulling up pants, then completed transfer to edge of bed with max A. Pt transferred edge of bed to w/c with mod to max A squat pivot transfer. Pt propelled w/c about 150 feet with B UEs and L LE, occasional rest breaks with S and verbal cues. Pt transferred w/c to mat, mat to w/c with sliding board and min A with verbal cues. Pt transferred edge of mat to supine with min A. Pt transferred supine to edge of mat with mod to max A. Pt performed LE exercises on mat and in w.c for LE strengthening and flexibility. Pt propelled w/c about 150 feet with occasional rest break and S with verbal cues, utilizing B UEs and L LE.   Therapy Documentation Precautions:  Precautions Precautions: Fall Restrictions Weight Bearing Restrictions: Yes RLE Weight Bearing: Non weight bearing General:   Pain: Pt c/o "nerve pain" R AKA.   See FIM for current functional status  Therapy/Group: Individual Therapy  Dub Amis 01/24/2014, 11:56 AM

## 2014-01-25 ENCOUNTER — Inpatient Hospital Stay (HOSPITAL_COMMUNITY): Payer: Medicare Other | Admitting: Occupational Therapy

## 2014-01-25 ENCOUNTER — Encounter (HOSPITAL_COMMUNITY): Payer: Medicare Other | Admitting: Occupational Therapy

## 2014-01-25 ENCOUNTER — Inpatient Hospital Stay (HOSPITAL_COMMUNITY): Payer: Medicare Other

## 2014-01-25 ENCOUNTER — Ambulatory Visit (HOSPITAL_COMMUNITY): Payer: Medicare Other

## 2014-01-25 MED ORDER — ALPRAZOLAM 0.25 MG PO TABS
0.2500 mg | ORAL_TABLET | Freq: Two times a day (BID) | ORAL | Status: DC
  Administered 2014-01-25 – 2014-01-27 (×5): 0.25 mg via ORAL
  Filled 2014-01-25 (×5): qty 1

## 2014-01-25 MED ORDER — ENSURE PUDDING PO PUDG
1.0000 | Freq: Two times a day (BID) | ORAL | Status: DC
  Administered 2014-01-25 – 2014-01-27 (×4): 1 via ORAL

## 2014-01-25 NOTE — Progress Notes (Signed)
Occupational Therapy Session Note  Patient Details  Name: [name] MRN: [id number] Date of Birth: 04-11-1929  Today's Date: 01/25/2014 Time: 1000-1100 Time Calculation (min): 60 min  Short Term Goals: Week 1:  OT Short Term Goal 1 (Week 1): Patient will complete upper body bathing with setup assist OT Short Term Goal 1 - Progress (Week 1): Met OT Short Term Goal 2 (Week 1): Patient will complete lower body bathing with mod assist OT Short Term Goal 2 - Progress (Week 1): Met OT Short Term Goal 3 (Week 1): Patient will complete dressing with mod assist OT Short Term Goal 3 - Progress (Week 1): Met OT Short Term Goal 4 (Week 1): Patient will complete transfer from w/c to [facility] and back with mod assist OT Short Term Goal 4 - Progress (Week 1): Met  Skilled Therapeutic Interventions/Progress Updates:    1:1 self care retraining/ static sitting balance/ basic transfer: Pt supine on arrival. Pt with basin placed on bedside tray due to patients height to simulate sink level bathing. Pt completing entire UB bathing and bil thigh hygiene. Pt returning to supine to complete peri care. Pt demonstrates ability to thread pants in supine and log roll rt and lt using bed rails to pull up pants. Pt required x2 (A) supine <>sit min (A). Pt pushing up with Lt UE however with deficits due to arthritis. Pt attempting to transfer Lt into w/c stand pivot unsuccessful. Pt requesting help. Pt mod (A) to transfer left into w/c with arm rest removed. Pt using bil arm rest to position in chair. Pt positioned at sink level for therapist to curl hair. Pt very grateful for attention to hair. Pt and husband report pending d/c could be held due to inability to finish ramp to enter home.   Therapy Documentation Precautions:  Precautions Precautions: Fall Restrictions Weight Bearing Restrictions: Yes RLE Weight Bearing: Non weight bearing General:   Vital Signs: Therapy Vitals BP: 90/42 mmHg Pain: Pain  Assessment Pain Assessment: No/denies pain ADL: ADL ADL Comments: see FIM  See FIM for current functional status  Therapy/Group: Individual Therapy  Peri Maris 01/25/2014, 3:19 PM Pager: [phone]

## 2014-01-25 NOTE — Progress Notes (Signed)
          Daily Rounding Note  01/25/2014, 8:47 AM  LOS: 11 days   SUBJECTIVE:       BMs over weekend but pt still with anorexia.  No real change in post prandial blating and discomfort.   OBJECTIVE:         Vital signs in last 24 hours:    Temp:  [97.9 F (36.6 C)-99 F (37.2 C)] 97.9 F (36.6 C) (02/16 0511) Pulse Rate:  [67-68] 68 (02/16 0511) Resp:  [18] 18 (02/16 0511) BP: (92-127)/(40-61) 127/61 mmHg (02/16 0511) SpO2:  [96 %-97 %] 97 % (02/16 0511) Last BM Date: 01/24/14 (x3 after suppositiory) General: cachectic.  Looks chronically unwell, ashen and pale   Heart: RRR Chest: diminished BS on right Abdomen: soft, ND, NT, active BS  Extremities: no CCE, Right BKA bandaged and in harness.  Neuro/Psych:  Pleasant, oriented x 3.  No gross deficits.   Intake/Output from previous day: 02/15 0701 - 02/16 0700 In: 420 [P.O.:420] Out: -   Intake/Output this shift: Total I/O In: 360 [P.O.:360] Out: -   No labs.   Marland Kitchen allopurinol  100 mg Oral Q48H  . ALPRAZolam  0.25 mg Oral BID  . calcitRIOL  0.5 mcg Oral Q48H  . darbepoetin  25 mcg Subcutaneous Q7 days  . feeding supplement (RESOURCE BREEZE)  1 Container Oral Q24H  . hydrocortisone   Rectal TID  . levothyroxine  75 mcg Oral QAC breakfast  . losartan  25 mg Oral Daily  . metolazone  2.5 mg Oral Q48H  . pantoprazole  40 mg Oral Daily  . polyethylene glycol  17 g Oral Daily  . potassium chloride  20 mEq Oral Daily  . torsemide  140 mg Oral Daily  . vitamin C  500 mg Oral Daily   ASSESMENT:   *  Chronic mesenteric ischemia with post prandial pain/bloating. Mesenteric vasc dz confirmed by lower extremity angiogram and plain xray.  Miralax added for constipation 12/22/13.  Sxs persist despite having BMs.  * History of PUD. 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  Ulcers healed, mild gastritis EGD1/2012.  Adenomatous polyps, diverticulosis, mixed hemorrhoids,  lipoma on Colonoscopy 10/2010.  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  On pantoprazole daily.  *  Protein malnutrition.  *  Right AKA secondary to critical ischemia 01/11/2014. *  Chronic renal insufficiency. Baseline creatinine 3.48-3.76. *  Chronic and ABL anemia, on Darbopoetin weekly.   No transfusions this admission.  *  History of atrial fibrillation with cardioversion.  *  COPD    PLAN   *  Supportive care.   *  Consider seeking Dr Lianne Moris (vasc surgery) re mesenteric vascular disease remediation (bypass, stenting?) *  Will stop tid Anusol HC cream as not having rectal bleeding or pain.  *  Add ensure to diet BID, in addition to existing once daily Breeze supplement.     Azucena Freed  01/25/2014, 8:47 AM Pager: [phone]  GI Attending Note  I have personally taken an interval history, reviewed the chart, and examined the patient.  I agree with the extender's note, impression and recommendations.  There is no GI intervention we can offer.  Signing off.  Sandy Salaam. Deatra Ina, MD, [facility] [phone]

## 2014-01-25 NOTE — Progress Notes (Signed)
Physical Therapy Discharge Summary  Patient Details  Name: [name] MRN: [id number] Date of Birth: 06-21-1929  Today's Date: 01/26/2014   Patient has met 5 of 5 long term goals due to improved activity tolerance, improved balance, increased strength, decreased pain and ability to compensate for deficits.  Patient to discharge at a wheelchair level S to min A using a slideboard for transfers.  Patient's family is independent to provide the necessary physical assistance at discharge and successfully completed family education.  Reasons goals not met: n/a  Recommendation:  Patient will benefit from ongoing skilled PT services in home health setting to continue to advance safe functional mobility, address ongoing impairments in strength, endurance, balance, functional mobility, prosthetic education, and minimize fall risk.  Equipment: 16x16 w/c and 17" seat to floor height with J2 cushion, slideboard, youth RW  Reasons for discharge: treatment goals met and discharge from hospital  Patient/family agrees with progress made and goals achieved: Yes  PT Discharge Precautions/Restrictions Precautions Precautions: Fall Restrictions RLE Weight Bearing: Non weight bearing Vision/Perception  Perception Perception: Within Functional Limits Praxis Praxis: Intact  Cognition Overall Cognitive Status: Within Functional Limits for tasks assessed Safety/Judgment: Appears intact Sensation Sensation Light Touch: Appears Intact (R phantom pain/sensation) Coordination Gross Motor Movements are Fluid and Coordinated: Yes Motor  Motor Motor: Within Functional Limits  Locomotion    Mod I w/c mobility Trunk/Postural Assessment  Cervical Assessment Cervical Assessment: Within Functional Limits Thoracic Assessment Thoracic Assessment:  (same as admission) Lumbar Assessment Lumbar Assessment:  (posterior tilt) Postural Control Postural Control: Within Functional Limits   Balance Dynamic Sitting Balance Dynamic Sitting - Level of Assistance: 6: Modified independent (Device/Increase time) Static Standing Balance Static Standing - Level of Assistance: 4: Min assist Extremity Assessment      RLE Assessment RLE Assessment:  (R AKA; 3+/5 grossly) LLE Assessment LLE Assessment:  (grossly 4-/5)  See FIM for current functional status  Canary Brim [facility] 01/26/2014, 1:31 PM

## 2014-01-25 NOTE — Discharge Summary (Signed)
NAME:  [name], [name] NO.:  [id number]  MEDICAL RECORD NO.:  [id number]  LOCATION:  [id number]                        FACILITY:  [facility]  PHYSICIAN:  [name], M.D.DATE OF BIRTH:  1929/03/29  DATE OF ADMISSION:  01/14/2014 DATE OF DISCHARGE:  01/27/2014                              DISCHARGE SUMMARY   DISCHARGE DIAGNOSES: 1. Right above-knee amputation secondary to gangrenous changes. 2. Subcutaneous Lovenox for deep vein thrombosis prophylaxis. 3. Acute blood loss anemia. 4. Chronic renal insufficiency. 5. History of atrial fibrillation cardioversion. 6. Hypertension. 7. Hypothyroidism. 8. Chronic obstructive pulmonary disease. 9. Chronic mesenteric ischemia.  HISTORY OF PRESENT ILLNESS:  This is an 80 year old right-handed female with history of atrial fibrillation, cardioversion as well as cardiomyopathy baseline creatinine 3.48-3.76, who was admitted January 11, 2014, with right lower extremity pain as well as medial leg ulcers with serous drainage.  The patient underwent recent angiographic procedures for evaluation of right lower extremity pain showing severe proximal stenosis and poor runoff.  The limb was not felt to be salvageable.  Underwent right above-knee amputation January 11, 2014, per Dr. Bridgett [name].  Postoperative pain management.  Subcutaneous Lovenox for DVT prophylaxis.  Follow up [name] prosthetics.  Acute blood loss anemia 2.8.  Bouts of hypokalemia 2.8 with supplement added.  Physical and occupational therapy ongoing.  The patient was admitted for comprehensive rehab program.  PAST MEDICAL HISTORY:  See discharge diagnoses.  SOCIAL HISTORY:  Lives with family.  FUNCTIONAL HISTORY PRIOR TO ADMISSION:  Sedentary.  FUNCTIONAL STATUS UPON ADMISSION TO REHAB SERVICES:  Moderate assist mobility, ambulation not tested.  PHYSICAL EXAMINATION:  VITAL SIGNS:  Blood pressure 108/43, pulse 76, temperature 98, respirations 16. GENERAL:  This  was an alert female, oriented x3, made good eye contact with examiner.  HEENT:  Pupils were round and reactive to light. LUNGS:  Clear to auscultation. CARDIAC:  Rate controlled. ABDOMEN:  Soft, nontender.  Good bowel sounds. EXTREMITIES:  Right AKA site was dressed appropriately, tender, chronic vascular changes left lower extremity.  REHABILITATION HOSPITAL COURSE:  The patient was admitted to Inpatient Rehab Services with therapies initiated on a 3-hour daily basis consisting of physical therapy, occupational therapy, and rehabilitation nursing.  The following issues were addressed during the patient's rehabilitation stay.  Pertaining to [name]' right above-knee amputation secondary to gangrenous changes January 11, 2014, site healing nicely, stump sock for protection.  Subcutaneous Lovenox for DVT prophylaxis.  Pain management with the use of hydrocodone.  Acute blood loss anemia.  Hemoglobin 9.8 monitored.  She did have a history of chronic renal insufficiency, baseline creatinine 3.48-3.76 remaining stable.  Blood pressures were well controlled and monitored.  Noted ongoing bouts of nausea, abdominal discomfort.  Abdominal films unremarkable.  Noted history of chronic mesenteric ischemia being diffuse with followup by Gastroenterology in the past with full workup. MiraLAX was added for constipation and monitored.  Bowel movements were well established.  She had undergone a full extensive workup in the past and would follow up with Gastroenterology Services on discharge.  The patient received weekly collaborative interdisciplinary team conferences to discuss estimated length of stay, family teaching, and any barriers to discharge.  The patient transverse sit-to-stand  from commode with max assist, transfer from the edge of bed to wheelchair with mod to max assist squat pivot transfers, propel her wheelchair with bilateral upper extremities and left lower extremity.   Transferred supine to edge of mat with moderate to max assist.  She was still needing some assistance with lower body activities of daily living.  Full family teaching was completed.  [name] was to be discharged home with ongoing therapies dictated per [facility].  DISCHARGE MEDICATIONS: 1. Allopurinol 100 mg p.o. every 48 hours. 2. Xanax 0.25 mg p.o. b.i.d. 3. Rocaltrol 0.5 mcg p.o. every 48 hours. 4. Hydrocodone 1-2 tablets every 6 hours as needed moderate pain. 5. Synthroid 75 mcg p.o. daily. 6. Cozaar 25 mg p.o. daily. 7. Zaroxolyn 2.5 mg p.o. every 48 hours. 8. Protonix 40 mg p.o. daily. 9. MiraLax 17 g p.o. daily. 10.Potassium chloride 20 mg p.o. daily. 11.Senokot S 1 tablet at bedtime as needed. 12.Mylicon 80 mg p.o. every 4 times daily as needed. 13.Demadex 140 mg p.o. daily. 14.Vitamin C 500 mg p.o. daily.  DIET:  Low residue.  FOLLOWUP:  She would follow up Dr. Alger [name] at the outpatient rehab service office as advised, Dr. Lucio [name] of Gastroenterology Services as needed, Dr. Adele [name], Vascular Surgery 2 weeks call for appointment, Dr. Unice [name], Medical Management.     [name], P.A.   ______________________________ [name], M.D.    DA/MEDQ  D:  01/25/2014  T:  01/25/2014  Job:  [id number]  cc:   [name] Halawa, MD [name] Riffle. Fuller Plan, MD, [name] Saupe. Linna Darner, MD,FACP,FCCP

## 2014-01-25 NOTE — Discharge Summary (Signed)
Discharge summary job # [id number]

## 2014-01-25 NOTE — Progress Notes (Signed)
Physical Therapy Session Note  Patient Details  Name: HYLA COARD MRN: [id number] Date of Birth: 1929/04/03  Today's Date: 01/25/2014  Short Term Goals: Week 2:  PT Short Term Goal 1 (Week 2): =LTG  Session #1 Time: 1130-1156 Time Calculation (min): 26 min Individual therapy; Denies pain. Session focused on w/c mobility for endurance and strengthening mod I household distances, sit to stands in parallel bars with mod/max A (Attempted to take a step forward but unable) and min A for dynamic standing balance, and discussion regarding planned family education this PM with focus on slideboard transfers bed <-> w/c and simulated car.   Session #2 Time: 1345-1540 Time Calculations (min): 55 min Individual therapy; Denies pain. Session focused on family education with pt's husband, daughter, and granddaughter in reference to practicing slideboard transfers in and out of bed, bed mobility (S for sit to supine but still requires min A for supine to sit) on ADL apartment bed, simulated car transfers, and education on household set-up accessibility. Family able to safely return demonstrate all transfer techniques.   Therapy Documentation Precautions:  Precautions Precautions: Fall Restrictions Weight Bearing Restrictions: Yes RLE Weight Bearing: Non weight bearing  See FIM for current functional status  Therapy/Group: Individual Therapy  Canary Brim [facility] 01/25/2014, 2:44 PM

## 2014-01-25 NOTE — Progress Notes (Signed)
Occupational Therapy Session Note  Patient Details  Name: [name] MRN: [id number] Date of Birth: 1928/12/22  Today's Date: 01/25/2014 Time: 0865-7846 Time Calculation (min): 45 min  Skilled Therapeutic Interventions/Progress Updates:    Pt/family education during session.  Pt's daughter able to assist pt with toilet transfer and toileting during session.  She was able to perform squat pivot transfers from wheelchair to and from the toilet with min assist.  Pt stood using the grab bar with min assist, while daughter provided mod facilitation to pull pants up over her hips.  Since pt does not have a grab bar to use for standing at home feel she will need youth RW.  Also discussed the need for a safety gait belt to make it easier to help her keep her balance and for assisting with sit to stand.  Pt is able to perform sit to stand from her wheelchair with min assist using the LUE on the wheelchair arm rest and the RUE up on the walker.  Once standing she is able to release her UEs unilaterally for drinking from glass with min guard assist.  She stood for at least 2 mins X2.  Had pt and daughter practice tub shower transfers squat pivot also with min assist.  Husband also present for session but is unable to assist physically.  Therapy Documentation Precautions:  Precautions Precautions: Fall Restrictions Weight Bearing Restrictions: Yes RLE Weight Bearing: Non weight bearing  Pain: Pain Assessment Pain Assessment: No/denies pain ADL: ADL ADL Comments: see FIM  See FIM for current functional status  Therapy/Group: Individual Therapy  Donaldson Richter OTR/L 01/25/2014, 1:55 PM

## 2014-01-25 NOTE — Progress Notes (Addendum)
Subjective/Complaints: Had a severe anxiety attack last night. Could not get settled until late   A 12 point review of systems has been performed and if not noted above is otherwise negative.   Objective: Vital Signs: Blood pressure 127/61, pulse 68, temperature 97.9 F (36.6 C), temperature source Oral, resp. rate 18, weight 48.7 kg (107 lb 5.8 oz), SpO2 97.00%. No results found. No results found for this basename: WBC, HGB, HCT, PLT,  in the last 72 hours No results found for this basename: NA, K, CL, CO, GLUCOSE, BUN, CREATININE, CALCIUM,  in the last 72 hours CBG (last 3)  No results found for this basename: GLUCAP,  in the last 72 hours  Wt Readings from Last 3 Encounters:  01/20/14 48.7 kg (107 lb 5.8 oz)  01/12/14 49.3 kg (108 lb 11 oz)  01/12/14 49.3 kg (108 lb 11 oz)    Physical Exam:  Constitutional: She is oriented to person, place, and time.  Frail appearing. HENT:  Head: Normocephalic.  Eyes: EOM are normal.  Neck: Normal range of motion. Neck supple. No thyromegaly present.  Cardiovascular: Normal rate and regular rhythm. No murmur Respiratory: Effort normal and breath sounds normal. No respiratory distress.  GI: Soft. Bowel sounds are normal. She exhibits no distension. minimally-tender Musculoskeletal: Left upper arm fistula  Left foot no pain to palpation , no pain with AROM Neurological: She is alert and oriented to person, place, and time.  Follow simple commands. UE's 5/5. LLE is 3+ HF, KE, 4- at ankle Skin:  High right AKA incision well approximated, tender.dressing intact  Psychiatric: She has a normal mood and affect. Her behavior is normal. pleasant   Assessment/Plan: 1. Functional deficits secondary to Right AKA which require 3+ hours per day of interdisciplinary therapy in a comprehensive inpatient rehab setting. Physiatrist is providing close team supervision and 24 hour management of active medical problems listed below. Physiatrist  and rehab team continue to assess barriers to discharge/monitor patient progress toward functional and medical goals.    FIM: FIM - Bathing Bathing Steps Patient Completed: Chest;Abdomen;Left Arm;Right upper leg;Right Arm;Left upper leg;Left lower leg (including foot);Front perineal area;Buttocks Bathing: 5: Set-up assist to: Open items  FIM - Upper Body Dressing/Undressing Upper body dressing/undressing steps patient completed: Pull shirt over trunk;Thread/unthread left sleeve of pullover shirt/dress;Thread/unthread right sleeve of pullover shirt/dresss;Hook/unhook bra;Thread/unthread left bra strap;Thread/unthread right bra strap;Put head through opening of pull over shirt/dress Upper body dressing/undressing: 5: Set-up assist to: Obtain clothing/put away FIM - Lower Body Dressing/Undressing Lower body dressing/undressing steps patient completed: Thread/unthread right pants leg;Thread/unthread left pants leg Lower body dressing/undressing: 3: Mod-Patient completed 50-74% of tasks  FIM - Toileting Toileting steps completed by patient: Performs perineal hygiene Toileting Assistive Devices: Grab bar or rail for support Toileting: 2: Max-Patient completed 1 of 3 steps  FIM - Radio producer Devices: Recruitment consultant Transfers: 3-To toilet/BSC: Mod A (lift or lower assist)  FIM - Financial planner Transfer: 4: Chair or W/C > Bed: Min A (steadying Pt. > 75%);4: Bed > Chair or W/C: Min A (steadying Pt. > 75%)  FIM - Locomotion: Wheelchair Distance: 45 Locomotion: Wheelchair: 5: Travels 150 ft or more: maneuvers on rugs and over door sills with supervision, cueing or coaxing FIM - Locomotion: Ambulation Locomotion: Ambulation:  (pt unable to perform on eval)  Comprehension Comprehension Mode: Auditory Comprehension: 6-Follows complex conversation/direction: With extra time/assistive  device  Expression Expression Mode: Verbal Expression: 6-Expresses complex ideas: With extra time/assistive device  Social Interaction Social Interaction: 6-Interacts appropriately with others with medication or extra time (anti-anxiety, antidepressant).  Problem Solving Problem Solving: 6-Solves complex problems: With extra time  Memory Memory: 6-More than reasonable amt of time  Medical Problem List and Plan:  1. Right AKA secondary to gangrenous changes 01/11/2014   -stump sock for protection/hygiene 2. DVT Prophylaxis/Anticoagulation: Subcutaneous Lovenox.   3. Pain Management: Oxycodone changed to hydrocodone due to potential nausea SE. 4. Neuropsych: This patient is capable of making decisions on her own behalf.  -xanax .25mg  bid  5. Acute blood loss anemia. Followup hgb 9.8, might have bleeding hemorrhoid-   6. Chronic renal insufficiency. Baseline creatinine 3.48-3.76.    -potassium wnl 7. History of atrial fibrillation with cardioversion. Cardiac rate control. No chest pain or shortness of breath  8. Hypertension. Cozaar 50 mg daily, systolic on low side will reduce to 25 mg, Zaroxolyn 2.5 mg every other day, Demadex 140 mg daily.   -tight control 9. Hypothyroidism. Synthroid  10. COPD. Wore oxygen at night at home  -dc during the day as able 11. GERD. Protonix, + BM, simethicone for gas  Chronic mesenteric ischemia, diffuse, no specific tx recs, Miralax ordered  -?anxiety component which magnifies symptoms  -appreciate GI follow up LOS (Days) 11 A FACE TO FACE EVALUATION WAS PERFORMED  Tauren Delbuono T 01/25/2014 8:36 AM

## 2014-01-26 ENCOUNTER — Inpatient Hospital Stay (HOSPITAL_COMMUNITY): Payer: Medicare Other

## 2014-01-26 ENCOUNTER — Inpatient Hospital Stay (HOSPITAL_COMMUNITY): Payer: Medicare Other | Admitting: Physical Therapy

## 2014-01-26 ENCOUNTER — Encounter (HOSPITAL_COMMUNITY): Payer: Medicare Other | Admitting: Occupational Therapy

## 2014-01-26 ENCOUNTER — Inpatient Hospital Stay (HOSPITAL_COMMUNITY): Payer: Medicare Other | Admitting: Occupational Therapy

## 2014-01-26 NOTE — Progress Notes (Signed)
Reviewed and in agreement with treatment provided.  

## 2014-01-26 NOTE — Progress Notes (Signed)
Physical Therapy Session Note  Patient Details  Name: [name] MRN: [id number] Date of Birth: May 18, 1929  Today's Date: 01/26/2014 Time: 8786-7672 Time Calculation (min): 60 min  Short Term Goals: Week 2:  PT Short Term Goal 1 (Week 2): =LTG  Skilled Therapeutic Interventions/Progress Updates:    Session consisted of pt performing slideboard transfers with S (patient showing improved ability at performing transfer with proper hand placement with S mainly needed in order to assist patient with slideboard setup), performing w/c propulsion from pt's room to the rehab gym with Mod I (one rest break needed due to pt fatigue) in order to improve functional independence, sit to stands x 2 with Mod A with the use of a RW (pt needed verbal cueing on appropriate foot and hand placement when performing transfer), taking a couple of small steps forward with Min A and the use of a RW with total distance covered= 1' (pt needed verbal cueing in order to appropriately move RW when taking these steps), performing bed mobility with Min A (verbal cueing also needed in order to properly use L shoulder when moving from supine to sit) and a toilet stand pivot transfer with Mod A and the use of a grab bar.   Therapy Documentation Precautions:  Precautions Precautions: Fall Restrictions Weight Bearing Restrictions: Yes  Pain:  Pt c/o slight pain in her stomach and RLE during the session. Pt took rest breaks throughout the session in order to alleviate this pain.   See FIM for current functional status  Therapy/Group: Individual Therapy  Tausha Milhoan 01/26/2014, 9:32 AM

## 2014-01-26 NOTE — Progress Notes (Signed)
Subjective/Complaints: Had a much better night. Belly still bothers her episodically as before   A 12 point review of systems has been performed and if not noted above is otherwise negative.   Objective: Vital Signs: Blood pressure 93/54, pulse 79, temperature 97.6 F (36.4 C), temperature source Oral, resp. rate 17, weight 48.7 kg (107 lb 5.8 oz), SpO2 96.00%. No results found. No results found for this basename: WBC, HGB, HCT, PLT,  in the last 72 hours No results found for this basename: NA, K, CL, CO, GLUCOSE, BUN, CREATININE, CALCIUM,  in the last 72 hours CBG (last 3)  No results found for this basename: GLUCAP,  in the last 72 hours  Wt Readings from Last 3 Encounters:  01/20/14 48.7 kg (107 lb 5.8 oz)  01/12/14 49.3 kg (108 lb 11 oz)  01/12/14 49.3 kg (108 lb 11 oz)    Physical Exam:  Constitutional: She is oriented to person, place, and time.  Frail appearing. HENT:  Head: Normocephalic.  Eyes: EOM are normal.  Neck: Normal range of motion. Neck supple. No thyromegaly present.  Cardiovascular: Normal rate and regular rhythm. No murmur Respiratory: Effort normal and breath sounds normal. No respiratory distress.  GI: Soft. Bowel sounds are normal. She exhibits no distension. minimally-tender Musculoskeletal: Left upper arm fistula  Left foot no pain to palpation , no pain with AROM Neurological: She is alert and oriented to person, place, and time.  Follow simple commands. UE's 5/5. LLE is 3+ HF, KE, 4- at ankle Skin:  High right AKA incision well approximated, tender.dressing intact  Psychiatric: She has a normal mood and affect. Her behavior is normal. pleasant   Assessment/Plan: 1. Functional deficits secondary to Right AKA which require 3+ hours per day of interdisciplinary therapy in a comprehensive inpatient rehab setting. Physiatrist is providing close team supervision and 24 hour management of active medical problems listed below. Physiatrist and  rehab team continue to assess barriers to discharge/monitor patient progress toward functional and medical goals.  Dc home tomorrow given weather today.   FIM: FIM - Bathing Bathing Steps Patient Completed: Chest;Right Arm;Left Arm;Abdomen;Front perineal area;Buttocks;Right upper leg;Left upper leg Bathing: 4: Min-Patient completes 8-9 35f 10 parts or 75+ percent  FIM - Upper Body Dressing/Undressing Upper body dressing/undressing steps patient completed: Thread/unthread right sleeve of pullover shirt/dresss;Thread/unthread left sleeve of pullover shirt/dress;Put head through opening of pull over shirt/dress;Pull shirt over trunk Upper body dressing/undressing: 5: Set-up assist to: Obtain clothing/put away FIM - Lower Body Dressing/Undressing Lower body dressing/undressing steps patient completed: Thread/unthread right pants leg;Thread/unthread left pants leg;Pull pants up/down Lower body dressing/undressing: 4: Min-Patient completed 75 plus % of tasks  FIM - Toileting Toileting steps completed by patient: Performs perineal hygiene Toileting Assistive Devices: Grab bar or rail for support Toileting: 2: Max-Patient completed 1 of 3 steps  FIM - Radio producer Devices: Recruitment consultant Transfers: 3-To toilet/BSC: Mod A (lift or lower assist)  FIM - Control and instrumentation engineer Devices: Sliding board;Arm rests Bed/Chair Transfer: 5: Bed > Chair or W/C: Supervision (verbal cues/safety issues);4: Supine > Sit: Min A (steadying Pt. > 75%/lift 1 leg)  FIM - Locomotion: Wheelchair Distance: 45 Locomotion: Wheelchair: 5: Travels 50 - 149 ft, turns around, maneuvers to table, bed or toilet, negotiates 3% grade: modified independent FIM - Locomotion: Ambulation Locomotion: Ambulation: 0: Activity did not occur (unable to attempt)  Comprehension Comprehension Mode: Auditory Comprehension: 6-Follows complex conversation/direction: With  extra  time/assistive device  Expression Expression Mode: Verbal Expression: 6-Expresses complex ideas: With extra time/assistive device  Social Interaction Social Interaction: 6-Interacts appropriately with others with medication or extra time (anti-anxiety, antidepressant).  Problem Solving Problem Solving: 6-Solves complex problems: With extra time  Memory Memory: 6-More than reasonable amt of time  Medical Problem List and Plan:  1. Right AKA secondary to gangrenous changes 01/11/2014   -stump sock for protection/hygiene 2. DVT Prophylaxis/Anticoagulation: Subcutaneous Lovenox.   3. Pain Management: Oxycodone changed to hydrocodone due to potential nausea SE. 4. Neuropsych: This patient is capable of making decisions on her own behalf.  -xanax .25mg  bid  5. Acute blood loss anemia. Followup hgb 9.8, might have bleeding hemorrhoid-   6. Chronic renal insufficiency. Baseline creatinine 3.48-3.76.    -potassium wnl 7. History of atrial fibrillation with cardioversion. Cardiac rate control. No chest pain or shortness of breath  8. Hypertension. Cozaar 50 mg daily, systolic on low side will reduce to 25 mg, Zaroxolyn 2.5 mg every other day, Demadex 140 mg daily.   -tight control 9. Hypothyroidism. Synthroid  10. COPD. Wore oxygen at night at home  -dc during the day as able 11. GERD. Protonix, + BM, simethicone for gas  Chronic mesenteric ischemia, diffuse, no specific tx recs, Miralax ordered  -?anxiety component which magnifies symptoms  -appreciate GI follow up LOS (Days) 12 A FACE TO FACE EVALUATION WAS PERFORMED  Kymorah Korf T 01/26/2014 11:01 AM

## 2014-01-26 NOTE — Progress Notes (Signed)
Physical Therapy Session Note  Patient Details  Name: [name] MRN: [id number] Date of Birth: 09-Nov-1929  Today's Date: 01/26/2014 Time: 1135-1205 Time Calculation (min): 30 min  Short Term Goals: Week 1:  PT Short Term Goal 1 (Week 1): Pt will perform functional transfers with min A PT Short Term Goal 1 - Progress (Week 1): Met PT Short Term Goal 2 (Week 1): Pt will tolerate standing balance for functional task x 1 minute with min A PT Short Term Goal 2 - Progress (Week 1): Not met (pt unable to tolerate static stance for more than 20 seconds with Min A and use of a RW)  Skilled Therapeutic Interventions/Progress Updates:    Amputee Home Exercise Program: x10 reps each exercise with min cues for positioning. Sliding board transfer to new w/c performed with supervision, PT placing board and setting up w/c. Sit <> stands at bar x 2 reps with mod assist. W/C cushion did not fit size of current w/c, discussed with primary PT who took action.   Therapy Documentation Precautions:  Precautions Precautions: Fall Restrictions Weight Bearing Restrictions: Yes RUE Weight Bearing: Non weight bearing RLE Weight Bearing: Non weight bearing Pain: No c/o pain this session  See FIM for current functional status  Therapy/Group: Individual Therapy  Ladona Horns Cheek 01/26/2014, 12:12 PM

## 2014-01-26 NOTE — Patient Care Conference (Signed)
Inpatient RehabilitationTeam Conference and Plan of Care Update Date: 01/26/2014   Time: 2:10 PM    Patient Name: [name]      Medical Record Number: [id number]  Date of Birth: 1929/03/11 Sex: Female         Room/Bed: 4W25C/4W25C-01 Payor Info: Payor: MEDICARE / Plan: MEDICARE PART A AND B / Product Type: *No Product type* /    Admitting Diagnosis: R AKA  Admit Date/Time:  01/14/2014  3:59 PM Admission Comments: No comment available   Primary Diagnosis:  <principal problem not specified> Principal Problem: <principal problem not specified>  Patient Active Problem List   Diagnosis Date Noted  . Atherosclerosis of abdominal aorta 01/22/2014  . Abdominal pain, chronic post-prandial, epigastric/periumbilical 44/12/270  . Loss of weight 01/22/2014  . S/P AKA (above knee amputation) 01/14/2014  . Protein-calorie malnutrition, severe 01/13/2014  . Atherosclerosis of artery of extremity with rest pain 01/11/2014  . Swelling of limb 12/25/2013  . Bilateral leg pain 12/25/2013  . Atherosclerosis of native arteries of the extremities with ulceration(440.23) 12/25/2013  . Venous stasis ulcer of right lower extremity 12/25/2013  . End stage renal disease 06/18/2012  . CONSTIPATION 02/13/2011  . NAUSEA AND VOMITING 11/27/2010  . Gastric ulcer, unspecified as acute or chronic, without mention of hemorrhage, perforation, or obstruction 09/25/2010  . Chronic kidney disease (CKD) stage G4/A2, severely decreased glomerular filtration rate (GFR) between 15-29 mL/min/1.73 square meter and albuminuria creatinine ratio between 30-299 mg/g 10/31/2009  . HYPERLIPIDEMIA-MIXED 07/22/2009  . CAROTID BRUITS, BILATERAL 05/25/2009  . HEMORRHOIDS, INTERNAL, WITH COMPLICATION 53/66/4403  . EXTERNAL HEMORRHOIDS, WITH COMPLICATION 47/42/5956  . ANEMIA-IRON DEFICIENCY 08/09/2008  . COLONIC POLYPS, ADENOMATOUS, HX OF 08/05/2008  . Blood in Stool 07/16/2008  . ATRIAL FIBRILLATION 07/11/2008  . CHF  01/16/2008  . GERD 01/16/2008  . OBSTRUCTIVE SLEEP APNEA 10/31/2007  . Hx of gout 06/19/2007  . Unspecified Anemia 06/19/2007  . HYPOTHYROIDISM 01/09/2007  . HYPERTENSION 01/09/2007  . CVA 01/09/2007  . PEPTIC ULCER, ACUTE, HEMORRHAGE, HX OF 01/09/2007    Expected Discharge Date: Expected Discharge Date: 01/27/14  Team Members Present: Physician leading conference: Dr. Alger Simons Social Worker Present: Lennart Pall, LCSW Nurse Present: Janyth Pupa, RN PT Present: Canary Brim, PT OT Present: Forde Radon, OT;Patricia Lissa Hoard, OT SLP Present: Weston Anna, SLP PPS Coordinator present : Daiva Nakayama, RN, CRRN     Current Status/Progress Goal Weekly Team Focus  Medical   abdominal pain chronic, demand ischemia, pain,   pacing activities  see prior, family and pt ed   Bowel/Bladder   Continent of bowel and bladder. LBM 01/26/14  Pt to remain continent of bowel and bladder  Monitor   Swallow/Nutrition/ Hydration             ADL's   overall supervison>min assist; w/c level & bed level for ADLs  overall supervision > min assist  d/c planning and family education   Mobility   S/min A overall w/c level  S/min A w/c level  goals met, fam ed complete, d/c   Communication             Safety/Cognition/ Behavioral Observations            Pain   Vicodin x 2 tabs q 4hrs, prn  <3  Monitor for effectiveness and offer 1 hr prior to therapy session   Skin   R AKA with visible blood on gauze to L distal end with shrinker intact.. Stage 2 on sacrum, with alleveny dressing  intact  No additional skin breakdown. Monitor for appropriate healing to incision and pressure ulcer       Rehab Goals Patient on target to meet rehab goals: Yes *See Care Plan and progress notes for long and short-term goals.  Barriers to Discharge: see prior    Possible Resolutions to Barriers:  pacing, goals adjusted    Discharge Planning/Teaching Needs:  home with husband to provide 24/7 assistance       Team Discussion:  Has met all goals and ready for d/c  Revisions to Treatment Plan:  None   Continued Need for Acute Rehabilitation Level of Care: The patient requires daily medical management by a physician with specialized training in physical medicine and rehabilitation for the following conditions: Daily direction of a multidisciplinary physical rehabilitation program to ensure safe treatment while eliciting the highest outcome that is of practical value to the patient.: Yes Daily medical management of patient stability for increased activity during participation in an intensive rehabilitation regime.: Yes Daily analysis of laboratory values and/or radiology reports with any subsequent need for medication adjustment of medical intervention for : Neurological problems;Other  Jonh Mcqueary 01/26/2014, 5:09 PM

## 2014-01-26 NOTE — Progress Notes (Signed)
Occupational Therapy Session Notes & Discharge Summary  Patient Details  Name: [name] MRN: [id number] Date of Birth: [birth year]  Today's Date: 01/26/2014  SESSION NOTES  Session #1 1035-1130 - 75 Minutes Individual Therapy Patient with complaints of nausea; RN aware Upon entering room, patient found seated in w/c beside bed. Patient with request to use bathroom. Therapist propelled patient into bathroom and patient transferred onto elevated toilet seat with min assist and required max assist for toileting needs (peri care and clothing management). Patient transferred back to w/c, then to edge of bed for UB/LB bathing tasks from bed level. Therapist is recommending bed level ADLs at this time secondary to patient's fluctuation in independence and patient's increased fatigue levels & decreased overall strength/endurance. Patient used reacher and long handled sponge to increase independence with LB ADLs. Therapist assisted patient with re-positioning in bed and at end of session, left patient supine in bed with all needed items within reach.    Session #2 9509-3267 - 33 Minutes Individual Therapy No complaints of pain Upon entering room, patient found seated in w/c. Patient propelled self > therapy gym. Patient mod I for this, but needed more than reasonable amount of time. Once in therapy gym, patient transferred onto therapy mat, uphill transfer using slide board with steady assist. From here, patient engaged in BUE strengthening exercises; focusing on bilateral elbows, forearms, shoulder shrugs, and scapular retraction (2lb dumbbell). Patient transferred off mat, downhill transfer using slide board with supervision. Therapist propelled patient back to room and left patient seated in w/c beside bed with all needed items within reach.   -------------------------------------------------------------------------------------------------------------------------------------  DISCHARGE  SUMMARY Patient has met 7 of 8 long term goals due to improved activity tolerance, improved balance, postural control, ability to compensate for deficits, improved attention, improved awareness and improved coordination.  Patient to discharge at [facility] Assist level, but does require max assist for toileting needs  Patient's care partner is independent (daughters and grand daughters) to provide the necessary physical assistance at discharge. Patient's husband is unable to provide the necessary physical assistance patient requires at this time.   Reasons goals not met: Patient did not meet toileting goal of moderate assistance, patient is inconsistent with this task depending on her fatigue level. Patient fluctuates between mod>max assist with toileting, which includes peri care and clothing management prior to and post toileting.   Recommendation:  Patient will benefit from ongoing skilled OT services in home health setting to continue to advance functional skills in the area of BADL and Reduce care partner burden.  Equipment: tub transfer bench and drop arm BSC  Reasons for discharge: treatment goals met and discharge from hospital  Patient/family agrees with progress made and goals achieved: Yes  Precautions/Restrictions  Precautions Precautions: Fall Restrictions Weight Bearing Restrictions: Yes RLE Weight Bearing: Non weight bearing  ADL ADL ADL Comments: see FIM  Vision/Perception  Vision - History Baseline Vision: Wears glasses only for reading Patient Visual Report: No change from baseline Vision - Assessment Eye Alignment: Within Functional Limits Perception Perception: Within Functional Limits Praxis Praxis: Intact   Cognition Overall Cognitive Status: Within Functional Limits for tasks assessed Arousal/Alertness: Awake/alert Orientation Level: Oriented X4 Sustained Attention: Appears intact Memory: Appears intact Awareness: Appears intact Problem Solving:  Appears intact Safety/Judgment: Appears intact  Sensation Sensation Additional Comments: BUEs appear intact Coordination Gross Motor Movements are Fluid and Coordinated: Yes Fine Motor Movements are Fluid and Coordinated: Yes  Motor  Motor Motor: Within Functional Limits  Trunk/Postural Assessment  Cervical Assessment Cervical Assessment: Within Functional Limits Thoracic Assessment Thoracic Assessment:  (kyphosis) Lumbar Assessment Lumbar Assessment:  (posterior tilt) Postural Control Postural Control: Within Functional Limits   Extremity/Trunk Assessment RUE Assessment RUE Assessment: Within Functional Limits (patient with decreased AROM(premorbid), but is [facility] for  ADLs) LUE Assessment LUE Assessment: Within Functional Limits (patient with decreased AROM(premorbid), but is [facility] for  ADLs)  See FIM for current functional status  Donnae Michels 01/26/2014, 1:53 PM

## 2014-01-26 NOTE — Progress Notes (Signed)
Social Work Patient ID: [name], female   DOB: 06/24/1929, 80 y.o.   MRN: [id number]  Lennart Pall, LCSW Social Worker Signed  Patient Care Conference Service date: 01/26/2014 5:09 PM  Inpatient RehabilitationTeam Conference and Plan of Care Update Date: 01/26/2014   Time: 2:10 PM     Patient Name: [name]       Medical Record Number: [id number]   Date of Birth: 22-Oct-1929 Sex: Female         Room/Bed: 4W25C/4W25C-01 Payor Info: Payor: MEDICARE / Plan: MEDICARE PART A AND B / Product Type: *No Product type* /   Admitting Diagnosis: R AKA   Admit Date/Time:  01/14/2014  3:59 PM Admission Comments: No comment available   Primary Diagnosis:  <principal problem not specified> Principal Problem: <principal problem not specified>    Patient Active Problem List     Diagnosis  Date Noted   .  Atherosclerosis of abdominal aorta  01/22/2014   .  Abdominal pain, chronic post-prandial, epigastric/periumbilical  24/08/7352   .  Loss of weight  01/22/2014   .  S/P AKA (above knee amputation)  01/14/2014   .  Protein-calorie malnutrition, severe  01/13/2014   .  Atherosclerosis of artery of extremity with rest pain  01/11/2014   .  Swelling of limb  12/25/2013   .  Bilateral leg pain  12/25/2013   .  Atherosclerosis of native arteries of the extremities with ulceration(440.23)  12/25/2013   .  Venous stasis ulcer of right lower extremity  12/25/2013   .  End stage renal disease  06/18/2012   .  CONSTIPATION  02/13/2011   .  NAUSEA AND VOMITING  11/27/2010   .  Gastric ulcer, unspecified as acute or chronic, without mention of hemorrhage, perforation, or obstruction  09/25/2010   .  Chronic kidney disease (CKD) stage G4/A2, severely decreased glomerular filtration rate (GFR) between 15-29 mL/min/1.73 square meter and albuminuria creatinine ratio between 30-299 mg/g  10/31/2009   .  HYPERLIPIDEMIA-MIXED  07/22/2009   .  CAROTID BRUITS, BILATERAL  05/25/2009   .  HEMORRHOIDS,  INTERNAL, WITH COMPLICATION  29/92/4268   .  EXTERNAL HEMORRHOIDS, WITH COMPLICATION  34/19/6222   .  ANEMIA-IRON DEFICIENCY  08/09/2008   .  COLONIC POLYPS, ADENOMATOUS, HX OF  08/05/2008   .  Blood in Stool  07/16/2008   .  ATRIAL FIBRILLATION  07/11/2008   .  CHF  01/16/2008   .  GERD  01/16/2008   .  OBSTRUCTIVE SLEEP APNEA  10/31/2007   .  Hx of gout  06/19/2007   .  Unspecified Anemia  06/19/2007   .  HYPOTHYROIDISM  01/09/2007   .  HYPERTENSION  01/09/2007   .  CVA  01/09/2007   .  PEPTIC ULCER, ACUTE, HEMORRHAGE, HX OF  01/09/2007     Expected Discharge Date: Expected Discharge Date: 01/27/14  Team Members Present: Physician leading conference: Dr. Alger Simons Social Worker Present: Lennart Pall, LCSW Nurse Present: Janyth Pupa, RN PT Present: Canary Brim, PT OT Present: Forde Radon, OT;Patricia Lissa Hoard, OT SLP Present: Weston Anna, SLP PPS Coordinator present : Daiva Nakayama, RN, CRRN        Current Status/Progress  Goal  Weekly Team Focus   Medical     abdominal pain chronic, demand ischemia, pain,   pacing activities  see prior, family and pt ed   Bowel/Bladder     Continent of bowel and bladder. LBM 01/26/14  Pt to  remain continent of bowel and bladder  Monitor   Swallow/Nutrition/ Hydration            ADL's     overall supervison>min assist; w/c level & bed level for ADLs  overall supervision > min assist  d/c planning and family education   Mobility     S/min A overall w/c level  S/min A w/c level  goals met, fam ed complete, d/c   Communication            Safety/Cognition/ Behavioral Observations           Pain     Vicodin x 2 tabs q 4hrs, prn  <3  Monitor for effectiveness and offer 1 hr prior to therapy session   Skin     R AKA with visible blood on gauze to L distal end with shrinker intact.. Stage 2 on sacrum, with alleveny dressing intact  No additional skin breakdown. Monitor for appropriate healing to incision and pressure ulcer       Rehab Goals Patient on target to meet rehab goals: Yes *See Care Plan and progress notes for long and short-term goals.    Barriers to Discharge:  see prior      Possible Resolutions to Barriers:    pacing, goals adjusted      Discharge Planning/Teaching Needs:    home with husband to provide 24/7 assistance      Team Discussion:    Has met all goals and ready for d/c   Revisions to Treatment Plan:    None    Continued Need for Acute Rehabilitation Level of Care: The patient requires daily medical management by a physician with specialized training in physical medicine and rehabilitation for the following conditions: Daily direction of a multidisciplinary physical rehabilitation program to ensure safe treatment while eliciting the highest outcome that is of practical value to the patient.: Yes Daily medical management of patient stability for increased activity during participation in an intensive rehabilitation regime.: Yes Daily analysis of laboratory values and/or radiology reports with any subsequent need for medication adjustment of medical intervention for : Neurological problems;Other  Calvin Chura 01/26/2014, 5:09 PM         Lennart Pall, LCSW Social Worker Signed  Patient Care Conference Service date: 01/26/2014 5:09 PM  Inpatient RehabilitationTeam Conference and Plan of Care Update Date: 01/26/2014   Time: 2:10 PM     Patient Name: [name]       Medical Record Number: [id number]   Date of Birth: 04/23/1929 Sex: Female         Room/Bed: 4W25C/4W25C-01 Payor Info: Payor: MEDICARE / Plan: MEDICARE PART A AND B / Product Type: *No Product type* /   Admitting Diagnosis: R AKA   Admit Date/Time:  01/14/2014  3:59 PM Admission Comments: No comment available   Primary Diagnosis:  <principal problem not specified> Principal Problem: <principal problem not specified>    Patient Active Problem List     Diagnosis  Date Noted   .  Atherosclerosis of abdominal aorta   01/22/2014   .  Abdominal pain, chronic post-prandial, epigastric/periumbilical  20/25/4270   .  Loss of weight  01/22/2014   .  S/P AKA (above knee amputation)  01/14/2014   .  Protein-calorie malnutrition, severe  01/13/2014   .  Atherosclerosis of artery of extremity with rest pain  01/11/2014   .  Swelling of limb  12/25/2013   .  Bilateral leg pain  12/25/2013   .  Atherosclerosis of native arteries of the extremities with ulceration(440.23)  12/25/2013   .  Venous stasis ulcer of right lower extremity  12/25/2013   .  End stage renal disease  06/18/2012   .  CONSTIPATION  02/13/2011   .  NAUSEA AND VOMITING  11/27/2010   .  Gastric ulcer, unspecified as acute or chronic, without mention of hemorrhage, perforation, or obstruction  09/25/2010   .  Chronic kidney disease (CKD) stage G4/A2, severely decreased glomerular filtration rate (GFR) between 15-29 mL/min/1.73 square meter and albuminuria creatinine ratio between 30-299 mg/g  10/31/2009   .  HYPERLIPIDEMIA-MIXED  07/22/2009   .  CAROTID BRUITS, BILATERAL  05/25/2009   .  HEMORRHOIDS, INTERNAL, WITH COMPLICATION  00/17/4944   .  EXTERNAL HEMORRHOIDS, WITH COMPLICATION  96/75/9163   .  ANEMIA-IRON DEFICIENCY  08/09/2008   .  COLONIC POLYPS, ADENOMATOUS, HX OF  08/05/2008   .  Blood in Stool  07/16/2008   .  ATRIAL FIBRILLATION  07/11/2008   .  CHF  01/16/2008   .  GERD  01/16/2008   .  OBSTRUCTIVE SLEEP APNEA  10/31/2007   .  Hx of gout  06/19/2007   .  Unspecified Anemia  06/19/2007   .  HYPOTHYROIDISM  01/09/2007   .  HYPERTENSION  01/09/2007   .  CVA  01/09/2007   .  PEPTIC ULCER, ACUTE, HEMORRHAGE, HX OF  01/09/2007     Expected Discharge Date: Expected Discharge Date: 01/27/14  Team Members Present: Physician leading conference: Dr. Alger Simons Social Worker Present: Lennart Pall, LCSW Nurse Present: Janyth Pupa, RN PT Present: Canary Brim, PT OT Present: Forde Radon, OT;Patricia Lissa Hoard, OT SLP Present:  Weston Anna, SLP PPS Coordinator present : Daiva Nakayama, RN, CRRN        Current Status/Progress  Goal  Weekly Team Focus   Medical     abdominal pain chronic, demand ischemia, pain,   pacing activities  see prior, family and pt ed   Bowel/Bladder     Continent of bowel and bladder. LBM 01/26/14  Pt to remain continent of bowel and bladder  Monitor   Swallow/Nutrition/ Hydration            ADL's     overall supervison>min assist; w/c level & bed level for ADLs  overall supervision > min assist  d/c planning and family education   Mobility     S/min A overall w/c level  S/min A w/c level  goals met, fam ed complete, d/c   Communication            Safety/Cognition/ Behavioral Observations           Pain     Vicodin x 2 tabs q 4hrs, prn  <3  Monitor for effectiveness and offer 1 hr prior to therapy session   Skin     R AKA with visible blood on gauze to L distal end with shrinker intact.. Stage 2 on sacrum, with alleveny dressing intact  No additional skin breakdown. Monitor for appropriate healing to incision and pressure ulcer      Rehab Goals Patient on target to meet rehab goals: Yes *See Care Plan and progress notes for long and short-term goals.    Barriers to Discharge:  see prior      Possible Resolutions to Barriers:    pacing, goals adjusted      Discharge Planning/Teaching Needs:    home with husband to provide 24/7 assistance  Team Discussion:    Has met all goals and ready for d/c   Revisions to Treatment Plan:    None    Continued Need for Acute Rehabilitation Level of Care: The patient requires daily medical management by a physician with specialized training in physical medicine and rehabilitation for the following conditions: Daily direction of a multidisciplinary physical rehabilitation program to ensure safe treatment while eliciting the highest outcome that is of practical value to the patient.: Yes Daily medical management of patient stability  for increased activity during participation in an intensive rehabilitation regime.: Yes Daily analysis of laboratory values and/or radiology reports with any subsequent need for medication adjustment of medical intervention for : Neurological problems;Other  Anwitha Mapes 01/26/2014, 5:09 PM

## 2014-01-27 MED ORDER — TORSEMIDE 20 MG PO TABS: 140.0000 mg | ORAL_TABLET | Freq: Every day | ORAL | Status: DC

## 2014-01-27 MED ORDER — HYDROCODONE-ACETAMINOPHEN 5-325 MG PO TABS: 1.0000 | ORAL_TABLET | Freq: Four times a day (QID) | ORAL | Status: DC | PRN

## 2014-01-27 MED ORDER — PANTOPRAZOLE SODIUM 40 MG PO TBEC: 40.0000 mg | DELAYED_RELEASE_TABLET | Freq: Every day | ORAL | Status: DC

## 2014-01-27 MED ORDER — ALLOPURINOL 100 MG PO TABS: 100.0000 mg | ORAL_TABLET | ORAL | Status: DC

## 2014-01-27 MED ORDER — SIMETHICONE 80 MG PO CHEW: 80.0000 mg | CHEWABLE_TABLET | Freq: Four times a day (QID) | ORAL | Status: DC | PRN

## 2014-01-27 MED ORDER — POLYETHYLENE GLYCOL 3350 17 G PO PACK: 17.0000 g | PACK | Freq: Every day | ORAL | Status: DC

## 2014-01-27 MED ORDER — LEVOTHYROXINE SODIUM 75 MCG PO TABS: 75.0000 ug | ORAL_TABLET | Freq: Every day | ORAL | Status: DC

## 2014-01-27 MED ORDER — ALPRAZOLAM 0.25 MG PO TABS: 0.2500 mg | ORAL_TABLET | Freq: Two times a day (BID) | ORAL | Status: AC

## 2014-01-27 MED ORDER — METOLAZONE 2.5 MG PO TABS: 2.5000 mg | ORAL_TABLET | ORAL | Status: DC

## 2014-01-27 MED ORDER — CALCITRIOL 0.5 MCG PO CAPS: 0.5000 ug | ORAL_CAPSULE | ORAL | Status: DC

## 2014-01-27 MED ORDER — POTASSIUM CHLORIDE CRYS ER 20 MEQ PO TBCR: 20.0000 meq | EXTENDED_RELEASE_TABLET | Freq: Every day | ORAL | Status: DC

## 2014-01-27 MED ORDER — LOSARTAN POTASSIUM 25 MG PO TABS: 25.0000 mg | ORAL_TABLET | Freq: Every day | ORAL | Status: DC

## 2014-01-27 NOTE — Discharge Instructions (Signed)
Inpatient Rehab Discharge Instructions  [name] Discharge date and time: No discharge date for patient encounter.   Activities/Precautions/ Functional Status: Activity: activity as tolerated Diet: regular diet Wound Care: keep wound clean and dry Functional status:  ___ No restrictions     ___ Walk up steps independently _x__ 24/7 supervision/assistance   ___ Walk up steps with assistance ___ Intermittent supervision/assistance  ___ Bathe/dress independently ___ Walk with walker     ___ Bathe/dress with assistance ___ Walk Independently    ___ Shower independently ___ Walk with assistance    ___ Shower with assistance ___ No alcohol     ___ Return to work/school ________     COMMUNITY REFERRALS UPON DISCHARGE:    Home Health:   PT     OT      RN                   Agency: Center Phone: [phone]    Medical Equipment/Items Ordered: wheelchair, Ulice Dash 2 cushion, rolling walker, drop arm commode, tub bench and hospital bed                                                     Agency/Supplier: [employer] @ [phone]   GENERAL COMMUNITY RESOURCES FOR PATIENT/FAMILY:  Support Groups: Amputee Group     Special Instructions: Followup 2 weeks with Dr. Bridgett Larsson for staple removal   My questions have been answered and I understand these instructions. I will adhere to these goals and the provided educational materials after my discharge from the hospital.  Patient/Caregiver Signature _______________________________ Date __________  Clinician Signature _______________________________________ Date __________  Please bring this form and your medication list with you to all your follow-up doctor's appointments.

## 2014-01-27 NOTE — Progress Notes (Signed)
Subjective/Complaints: Had another good night. Stomach tends to still bother her off and on.  A 12 point review of systems has been performed and if not noted above is otherwise negative.   Objective: Vital Signs: Blood pressure 123/63, pulse 65, temperature 97.6 F (36.4 C), temperature source Oral, resp. rate 18, weight 48.7 kg (107 lb 5.8 oz), SpO2 95.00%. No results found. No results found for this basename: WBC, HGB, HCT, PLT,  in the last 72 hours No results found for this basename: NA, K, CL, CO, GLUCOSE, BUN, CREATININE, CALCIUM,  in the last 72 hours CBG (last 3)  No results found for this basename: GLUCAP,  in the last 72 hours  Wt Readings from Last 3 Encounters:  01/20/14 48.7 kg (107 lb 5.8 oz)  01/12/14 49.3 kg (108 lb 11 oz)  01/12/14 49.3 kg (108 lb 11 oz)    Physical Exam:  Constitutional: She is oriented to person, place, and time.  Frail appearing. HENT:  Head: Normocephalic.  Eyes: EOM are normal.  Neck: Normal range of motion. Neck supple. No thyromegaly present.  Cardiovascular: Normal rate and regular rhythm. No murmur Respiratory: Effort normal and breath sounds normal. No respiratory distress.  GI: Soft. Bowel sounds are normal. She exhibits no distension. minimally-tender Musculoskeletal: Left upper arm fistula  Left foot no pain to palpation , no pain with AROM Neurological: She is alert and oriented to person, place, and time.  Follow simple commands. UE's 5/5. LLE is 3+ HF, KE, 4- at ankle Skin:  High right AKA incision well approximated, tender.dressing intact  Psychiatric: She has a normal mood and affect. Her behavior is normal. pleasant   Assessment/Plan: 1. Functional deficits secondary to Right AKA which require 3+ hours per day of interdisciplinary therapy in a comprehensive inpatient rehab setting. Physiatrist is providing close team supervision and 24 hour management of active medical problems listed below. Physiatrist and  rehab team continue to assess barriers to discharge/monitor patient progress toward functional and medical goals.  Dc home today. Follow up arranged.  FIM: FIM - Bathing Bathing Steps Patient Completed: Chest;Right Arm;Left Arm;Abdomen;Front perineal area;Buttocks;Right upper leg;Left upper leg;Left lower leg (including foot) Bathing: 5: Supervision: Safety issues/verbal cues  FIM - Upper Body Dressing/Undressing Upper body dressing/undressing steps patient completed: Thread/unthread right sleeve of pullover shirt/dresss;Thread/unthread left sleeve of pullover shirt/dress;Put head through opening of pull over shirt/dress;Pull shirt over trunk Upper body dressing/undressing: 5: Set-up assist to: Obtain clothing/put away FIM - Lower Body Dressing/Undressing Lower body dressing/undressing steps patient completed: Thread/unthread right pants leg;Thread/unthread left pants leg;Pull pants up/down Lower body dressing/undressing: 4: Min-Patient completed 75 plus % of tasks  FIM - Toileting Toileting steps completed by patient: Performs perineal hygiene Toileting Assistive Devices: Grab bar or rail for support Toileting: 2: Max-Patient completed 1 of 3 steps  FIM - Radio producer Devices: Elevated toilet seat;Grab bars Toilet Transfers: 4-To toilet/BSC: Min A (steadying Pt. > 75%);4-From toilet/BSC: Min A (steadying Pt. > 75%)  FIM - Control and instrumentation engineer Devices: Sliding board;Arm rests Bed/Chair Transfer: 4: Chair or W/C > Bed: Min A (steadying Pt. > 75%)  FIM - Locomotion: Wheelchair Distance: 45 Locomotion: Wheelchair: 5: Travels 50 - 149 ft, turns around, maneuvers to table, bed or toilet, negotiates 3% grade: modified independent FIM - Locomotion: Ambulation Locomotion: Ambulation Assistive Devices: Walker - Rolling Locomotion: Ambulation: 1: Travels less than 50 ft with minimal assistance (Pt.>75%) (pt took three steps forward  totalling 1')  Comprehension Comprehension Mode: Auditory Comprehension: 6-Follows complex conversation/direction: With extra time/assistive device  Expression Expression Mode: Verbal Expression: 6-Expresses complex ideas: With extra time/assistive device  Social Interaction Social Interaction: 6-Interacts appropriately with others with medication or extra time (anti-anxiety, antidepressant).  Problem Solving Problem Solving: 6-Solves complex problems: With extra time  Memory Memory: 6-More than reasonable amt of time  Medical Problem List and Plan:  1. Right AKA secondary to gangrenous changes 01/11/2014   -stump sock for protection/hygiene 2. DVT Prophylaxis/Anticoagulation: Subcutaneous Lovenox.   3. Pain Management: Oxycodone changed to hydrocodone due to potential nausea SE. 4. Neuropsych: This patient is capable of making decisions on her own behalf.  -xanax .25mg  bid---pt feels this has really helped her especially at night 5. Acute blood loss anemia. Followup hgb 9.8, might have bleeding hemorrhoid-   6. Chronic renal insufficiency. Baseline creatinine 3.48-3.76.    -potassium wnl 7. History of atrial fibrillation with cardioversion. Cardiac rate control. No chest pain or shortness of breath  8. Hypertension. Cozaar 50 mg daily, systolic on low side will reduce to 25 mg, Zaroxolyn 2.5 mg every other day, Demadex 140 mg daily.   -tight control 9. Hypothyroidism. Synthroid  10. COPD. Wore oxygen at night at home  -dc during the day as able 11. GERD. Protonix, + BM, simethicone for gas  Chronic mesenteric ischemia, diffuse, no specific tx recs, Miralax ordered  -?anxiety component which magnifies symptoms  -appreciate GI follow up LOS (Days) 13 A FACE TO FACE EVALUATION WAS PERFORMED  Akya Fiorello T 01/27/2014 8:37 AM

## 2014-01-27 NOTE — Progress Notes (Signed)
Social Work  Discharge Note  The overall goal for the admission was met for:   Discharge location: Yes - home with family to provide 24/7 assistance  Length of Stay: Yes - 13 days  Discharge activity level: Yes - minimal assistance w/c level  Home/community participation: Yes  Services provided included: MD, RD, PT, OT, RN, TR, Pharmacy and [place]: Medicare and Private Insurance: [employer]  Follow-up services arranged: Home Health: RN, PT, OT via [facility], DME: 16x16 lightweight w/c, Jay 2 cushion, youth rolling walker, semi-electric bed, drop arm commode and tub bench all via [facility] and Patient/Family has no preference for HH/DME agencies  Comments (or additional information): Family requests that pt be transported home via ambulance - aware of private pay costs to expect  Patient/Family verbalized understanding of follow-up arrangements: Yes  Individual responsible for coordination of the follow-up plan: patient  Confirmed correct DME delivered: Yulieth Carrender 01/27/2014    Zelma Mazariego

## 2014-01-28 ENCOUNTER — Encounter: Payer: Self-pay | Admitting: Vascular Surgery

## 2014-01-29 ENCOUNTER — Ambulatory Visit: Payer: Medicare Other | Admitting: Vascular Surgery

## 2014-02-01 ENCOUNTER — Emergency Department (HOSPITAL_COMMUNITY): Payer: Medicare Other

## 2014-02-01 ENCOUNTER — Telehealth: Payer: Self-pay | Admitting: Internal Medicine

## 2014-02-01 ENCOUNTER — Encounter (HOSPITAL_COMMUNITY): Payer: Self-pay | Admitting: Emergency Medicine

## 2014-02-01 ENCOUNTER — Inpatient Hospital Stay (HOSPITAL_COMMUNITY)
Admission: EM | Admit: 2014-02-01 | Discharge: 2014-02-05 | DRG: 194 | Disposition: A | Discharge status: Home Health | Payer: Medicare Other | Attending: Internal Medicine | Admitting: Internal Medicine

## 2014-02-01 DIAGNOSIS — E039 Hypothyroidism, unspecified: Secondary | ICD-10-CM

## 2014-02-01 DIAGNOSIS — Z860100 Personal history of colon polyps, unspecified: Secondary | ICD-10-CM

## 2014-02-01 DIAGNOSIS — M79605 Pain in left leg: Secondary | ICD-10-CM

## 2014-02-01 DIAGNOSIS — K921 Melena: Secondary | ICD-10-CM

## 2014-02-01 DIAGNOSIS — K259 Gastric ulcer, unspecified as acute or chronic, without hemorrhage or perforation: Secondary | ICD-10-CM

## 2014-02-01 DIAGNOSIS — D649 Anemia, unspecified: Secondary | ICD-10-CM

## 2014-02-01 DIAGNOSIS — Z89619 Acquired absence of unspecified leg above knee: Secondary | ICD-10-CM

## 2014-02-01 DIAGNOSIS — K644 Residual hemorrhoidal skin tags: Secondary | ICD-10-CM

## 2014-02-01 DIAGNOSIS — L98499 Non-pressure chronic ulcer of skin of other sites with unspecified severity: Secondary | ICD-10-CM

## 2014-02-01 DIAGNOSIS — M7989 Other specified soft tissue disorders: Secondary | ICD-10-CM

## 2014-02-01 DIAGNOSIS — I129 Hypertensive chronic kidney disease with stage 1 through stage 4 chronic kidney disease, or unspecified chronic kidney disease: Secondary | ICD-10-CM | POA: Diagnosis present

## 2014-02-01 DIAGNOSIS — I700 Atherosclerosis of aorta: Secondary | ICD-10-CM

## 2014-02-01 DIAGNOSIS — R5381 Other malaise: Secondary | ICD-10-CM | POA: Diagnosis present

## 2014-02-01 DIAGNOSIS — S78119A Complete traumatic amputation at level between unspecified hip and knee, initial encounter: Secondary | ICD-10-CM

## 2014-02-01 DIAGNOSIS — R0989 Other specified symptoms and signs involving the circulatory and respiratory systems: Secondary | ICD-10-CM

## 2014-02-01 DIAGNOSIS — K219 Gastro-esophageal reflux disease without esophagitis: Secondary | ICD-10-CM

## 2014-02-01 DIAGNOSIS — IMO0002 Reserved for concepts with insufficient information to code with codable children: Secondary | ICD-10-CM

## 2014-02-01 DIAGNOSIS — N039 Chronic nephritic syndrome with unspecified morphologic changes: Secondary | ICD-10-CM

## 2014-02-01 DIAGNOSIS — E43 Unspecified severe protein-calorie malnutrition: Secondary | ICD-10-CM

## 2014-02-01 DIAGNOSIS — M79604 Pain in right leg: Secondary | ICD-10-CM

## 2014-02-01 DIAGNOSIS — I509 Heart failure, unspecified: Secondary | ICD-10-CM

## 2014-02-01 DIAGNOSIS — G4733 Obstructive sleep apnea (adult) (pediatric): Secondary | ICD-10-CM

## 2014-02-01 DIAGNOSIS — R5383 Other fatigue: Secondary | ICD-10-CM

## 2014-02-01 DIAGNOSIS — I4891 Unspecified atrial fibrillation: Secondary | ICD-10-CM

## 2014-02-01 DIAGNOSIS — J4489 Other specified chronic obstructive pulmonary disease: Secondary | ICD-10-CM | POA: Diagnosis present

## 2014-02-01 DIAGNOSIS — I428 Other cardiomyopathies: Secondary | ICD-10-CM | POA: Diagnosis present

## 2014-02-01 DIAGNOSIS — K5900 Constipation, unspecified: Secondary | ICD-10-CM

## 2014-02-01 DIAGNOSIS — J09X2 Influenza due to identified novel influenza A virus with other respiratory manifestations: Secondary | ICD-10-CM

## 2014-02-01 DIAGNOSIS — M109 Gout, unspecified: Secondary | ICD-10-CM | POA: Diagnosis present

## 2014-02-01 DIAGNOSIS — J09X1 Influenza due to identified novel influenza A virus with pneumonia: Principal | ICD-10-CM | POA: Diagnosis present

## 2014-02-01 DIAGNOSIS — D509 Iron deficiency anemia, unspecified: Secondary | ICD-10-CM

## 2014-02-01 DIAGNOSIS — Z8739 Personal history of other diseases of the musculoskeletal system and connective tissue: Secondary | ICD-10-CM

## 2014-02-01 DIAGNOSIS — R634 Abnormal weight loss: Secondary | ICD-10-CM

## 2014-02-01 DIAGNOSIS — D631 Anemia in chronic kidney disease: Secondary | ICD-10-CM | POA: Diagnosis present

## 2014-02-01 DIAGNOSIS — I6350 Cerebral infarction due to unspecified occlusion or stenosis of unspecified cerebral artery: Secondary | ICD-10-CM

## 2014-02-01 DIAGNOSIS — N189 Chronic kidney disease, unspecified: Secondary | ICD-10-CM

## 2014-02-01 DIAGNOSIS — N183 Chronic kidney disease, stage 3 unspecified: Secondary | ICD-10-CM | POA: Diagnosis present

## 2014-02-01 DIAGNOSIS — N390 Urinary tract infection, site not specified: Secondary | ICD-10-CM

## 2014-02-01 DIAGNOSIS — I70229 Atherosclerosis of native arteries of extremities with rest pain, unspecified extremity: Secondary | ICD-10-CM

## 2014-02-01 DIAGNOSIS — F411 Generalized anxiety disorder: Secondary | ICD-10-CM | POA: Diagnosis present

## 2014-02-01 DIAGNOSIS — E785 Hyperlipidemia, unspecified: Secondary | ICD-10-CM

## 2014-02-01 DIAGNOSIS — L97919 Non-pressure chronic ulcer of unspecified part of right lower leg with unspecified severity: Secondary | ICD-10-CM

## 2014-02-01 DIAGNOSIS — D6489 Other specified anemias: Secondary | ICD-10-CM | POA: Diagnosis present

## 2014-02-01 DIAGNOSIS — G4730 Sleep apnea, unspecified: Secondary | ICD-10-CM | POA: Diagnosis present

## 2014-02-01 DIAGNOSIS — R112 Nausea with vomiting, unspecified: Secondary | ICD-10-CM

## 2014-02-01 DIAGNOSIS — N179 Acute kidney failure, unspecified: Secondary | ICD-10-CM | POA: Diagnosis present

## 2014-02-01 DIAGNOSIS — Z9981 Dependence on supplemental oxygen: Secondary | ICD-10-CM

## 2014-02-01 DIAGNOSIS — G8929 Other chronic pain: Secondary | ICD-10-CM

## 2014-02-01 DIAGNOSIS — I83019 Varicose veins of right lower extremity with ulcer of unspecified site: Secondary | ICD-10-CM

## 2014-02-01 DIAGNOSIS — Z8673 Personal history of transient ischemic attack (TIA), and cerebral infarction without residual deficits: Secondary | ICD-10-CM

## 2014-02-01 DIAGNOSIS — J449 Chronic obstructive pulmonary disease, unspecified: Secondary | ICD-10-CM | POA: Diagnosis present

## 2014-02-01 DIAGNOSIS — R1013 Epigastric pain: Secondary | ICD-10-CM

## 2014-02-01 DIAGNOSIS — I2510 Atherosclerotic heart disease of native coronary artery without angina pectoris: Secondary | ICD-10-CM | POA: Diagnosis present

## 2014-02-01 DIAGNOSIS — K648 Other hemorrhoids: Secondary | ICD-10-CM

## 2014-02-01 DIAGNOSIS — I10 Essential (primary) hypertension: Secondary | ICD-10-CM

## 2014-02-01 DIAGNOSIS — N184 Chronic kidney disease, stage 4 (severe): Secondary | ICD-10-CM

## 2014-02-01 DIAGNOSIS — K449 Diaphragmatic hernia without obstruction or gangrene: Secondary | ICD-10-CM | POA: Diagnosis present

## 2014-02-01 DIAGNOSIS — Z8711 Personal history of peptic ulcer disease: Secondary | ICD-10-CM

## 2014-02-01 DIAGNOSIS — J189 Pneumonia, unspecified organism: Secondary | ICD-10-CM | POA: Diagnosis present

## 2014-02-01 DIAGNOSIS — I959 Hypotension, unspecified: Secondary | ICD-10-CM

## 2014-02-01 DIAGNOSIS — I5032 Chronic diastolic (congestive) heart failure: Secondary | ICD-10-CM | POA: Diagnosis present

## 2014-02-01 DIAGNOSIS — I739 Peripheral vascular disease, unspecified: Secondary | ICD-10-CM

## 2014-02-01 DIAGNOSIS — Z803 Family history of malignant neoplasm of breast: Secondary | ICD-10-CM

## 2014-02-01 DIAGNOSIS — I2789 Other specified pulmonary heart diseases: Secondary | ICD-10-CM | POA: Diagnosis present

## 2014-02-01 DIAGNOSIS — N186 End stage renal disease: Secondary | ICD-10-CM

## 2014-02-01 DIAGNOSIS — Z8249 Family history of ischemic heart disease and other diseases of the circulatory system: Secondary | ICD-10-CM

## 2014-02-01 LAB — CBC WITH DIFFERENTIAL/PLATELET
Basophils Absolute: 0 K/uL (ref 0.0–0.1)
Basophils Relative: 0 % (ref 0–1)
Eosinophils Absolute: 0 K/uL (ref 0.0–0.7)
Eosinophils Relative: 0 % (ref 0–5)
HCT: 27.8 % — ABNORMAL LOW (ref 36.0–46.0)
Hemoglobin: 8.5 g/dL — ABNORMAL LOW (ref 12.0–15.0)
Lymphocytes Relative: 7 % — ABNORMAL LOW (ref 12–46)
Lymphs Abs: 0.5 10*3/uL — ABNORMAL LOW (ref 0.7–4.0)
MCH: 31.8 pg (ref 26.0–34.0)
MCHC: 30.6 g/dL (ref 30.0–36.0)
MCV: 104.1 fL — ABNORMAL HIGH (ref 78.0–100.0)
Monocytes Absolute: 0.5 K/uL (ref 0.1–1.0)
Monocytes Relative: 7 % (ref 3–12)
Neutro Abs: 5.7 10*3/uL (ref 1.7–7.7)
Neutrophils Relative %: 86 % — ABNORMAL HIGH (ref 43–77)
Platelets: 163 K/uL (ref 150–400)
RBC: 2.67 MIL/uL — ABNORMAL LOW (ref 3.87–5.11)
RDW: 17.1 % — ABNORMAL HIGH (ref 11.5–15.5)
WBC: 6.7 10*3/uL (ref 4.0–10.5)

## 2014-02-01 LAB — URINALYSIS, ROUTINE W REFLEX MICROSCOPIC
Bilirubin Urine: NEGATIVE
Glucose, UA: NEGATIVE mg/dL
Hgb urine dipstick: NEGATIVE
Ketones, ur: NEGATIVE mg/dL
Nitrite: NEGATIVE
Protein, ur: NEGATIVE mg/dL
Specific Gravity, Urine: 1.011 (ref 1.005–1.030)
Urobilinogen, UA: 0.2 mg/dL (ref 0.0–1.0)
pH: 6.5 (ref 5.0–8.0)

## 2014-02-01 LAB — COMPREHENSIVE METABOLIC PANEL
AST: 18 U/L (ref 0–37)
Alkaline Phosphatase: 81 U/L (ref 39–117)
BUN: 100 mg/dL — ABNORMAL HIGH (ref 6–23)
CO2: 32 mEq/L (ref 19–32)
Chloride: 91 mEq/L — ABNORMAL LOW (ref 96–112)
Creatinine, Ser: 2.44 mg/dL — ABNORMAL HIGH (ref 0.50–1.10)
GFR calc non Af Amer: 17 mL/min — ABNORMAL LOW (ref >90)
Potassium: 3.7 mEq/L (ref 3.7–5.3)
Total Bilirubin: 0.4 mg/dL (ref 0.3–1.2)

## 2014-02-01 LAB — COMPREHENSIVE METABOLIC PANEL WITH GFR
ALT: 8 U/L (ref 0–35)
Albumin: 2.3 g/dL — ABNORMAL LOW (ref 3.5–5.2)
Calcium: 9.1 mg/dL (ref 8.4–10.5)
GFR calc Af Amer: 20 mL/min — ABNORMAL LOW (ref >90)
Glucose, Bld: 93 mg/dL (ref 70–99)
Sodium: 139 meq/L (ref 137–147)
Total Protein: 6 g/dL (ref 6.0–8.3)

## 2014-02-01 LAB — URINE MICROSCOPIC-ADD ON

## 2014-02-01 LAB — I-STAT CG4 LACTIC ACID, ED: Lactic Acid, Venous: 1.41 mmol/L (ref 0.5–2.2)

## 2014-02-01 MED ORDER — SODIUM CHLORIDE 0.9 % IV SOLN
1000.0000 mL | Freq: Once | INTRAVENOUS | Status: AC
  Administered 2014-02-01: 1000 mL via INTRAVENOUS

## 2014-02-01 MED ORDER — SODIUM CHLORIDE 0.9 % IV BOLUS (SEPSIS)
500.0000 mL | Freq: Once | INTRAVENOUS | Status: AC
  Administered 2014-02-01: 500 mL via INTRAVENOUS

## 2014-02-01 MED ORDER — SODIUM CHLORIDE 0.9 % IV BOLUS (SEPSIS)
1000.0000 mL | Freq: Once | INTRAVENOUS | Status: AC
  Administered 2014-02-01: 1000 mL via INTRAVENOUS

## 2014-02-01 MED ORDER — DEXTROSE 5 % IV SOLN
1.0000 g | Freq: Once | INTRAVENOUS | Status: AC
  Administered 2014-02-01: 1 g via INTRAVENOUS
  Filled 2014-02-01: qty 10

## 2014-02-01 MED ORDER — ALBUTEROL SULFATE (2.5 MG/3ML) 0.083% IN NEBU
2.5000 mg | INHALATION_SOLUTION | Freq: Once | RESPIRATORY_TRACT | Status: AC
  Administered 2014-02-01: 2.5 mg via RESPIRATORY_TRACT
  Filled 2014-02-01: qty 3

## 2014-02-01 MED ORDER — LORAZEPAM 2 MG/ML IJ SOLN
0.5000 mg | Freq: Once | INTRAMUSCULAR | Status: AC
  Administered 2014-02-01: 0.5 mg via INTRAVENOUS
  Filled 2014-02-01: qty 1

## 2014-02-01 MED ORDER — DEXTROSE 5 % IV SOLN
1.0000 g | Freq: Once | INTRAVENOUS | Status: AC
  Administered 2014-02-01: 1 g via INTRAVENOUS
  Filled 2014-02-01: qty 1

## 2014-02-01 MED ORDER — ACETAMINOPHEN 325 MG PO TABS
650.0000 mg | ORAL_TABLET | Freq: Once | ORAL | Status: AC
  Administered 2014-02-01: 650 mg via ORAL
  Filled 2014-02-01: qty 2

## 2014-02-01 MED ORDER — FENTANYL CITRATE 0.05 MG/ML IJ SOLN
50.0000 ug | Freq: Once | INTRAMUSCULAR | Status: AC
  Administered 2014-02-01: 50 ug via INTRAVENOUS
  Filled 2014-02-01: qty 2

## 2014-02-01 MED ORDER — VANCOMYCIN HCL 1000 MG IV SOLR
1000.0000 mg | Freq: Once | INTRAVENOUS | Status: AC
  Administered 2014-02-02: 1000 mg via INTRAVENOUS
  Filled 2014-02-01: qty 1000

## 2014-02-01 NOTE — ED Provider Notes (Signed)
CSN: [id number]     Arrival date & time 02/01/14  1835 History   First MD Initiated Contact with Patient 02/01/14 1847     Chief Complaint  Patient presents with  . Fever     (Consider location/radiation/quality/duration/timing/severity/associated sxs/prior Treatment) HPI Comments: Pt with fever today, feeling poorly, weaker over the past few days . Pt is s/p right AKA on February 3rd by Dr. Bridgett Larsson due to poor circulation.  Pt has had a worsening cough, productive.  No N/V/D.  No obv sick contacts.  Home health RN checked pt, found fever, called Dr. Clayborn Heron office who is PCP and pt's family directed to call EMS to be brought to the ED immediately, for admission.  Patient is a 80 y.o. female presenting with fever. The history is provided by the patient.  Fever Max temp prior to arrival:  100.6 Temp source:  Oral Associated symptoms: chills, cough and diarrhea   Associated symptoms: no dysuria, no nausea and no vomiting     Past Medical History  Diagnosis Date  . Restrictive cardiomyopathy   . Atrial fibrillation 1996    S/P cardioversion  . CVA (cerebral infarction) 1999  . CPAP (continuous positive airway pressure) dependence   . Anemia     NOS  . Iron deficiency   . Gout   . Peptic ulcer, acute with hemorrhage 2005, 2011  . Hypertension   . Hypothyroidism   . Diastolic dysfunction   . Adenomatous colon polyp   . GERD (gastroesophageal reflux disease)   . Hiatal hernia   . Diverticulosis   . Anxiety disorder   . Arthritis   . Internal and external hemorrhoids without complication   . Morbid obesity   . CAD (coronary artery disease) 08/06/2008    One vessel by cath, EF overall preserved by echo 04/30/2008  . On home oxygen therapy   . Renal failure     Dr. Hassell Done  . Hyperlipidemia   . CHF (congestive heart failure)   . Complication of anesthesia     pt states she was sore "all-over" for over a week after anesthesia  . COAD (chronic obstructive airways disease) 2007     Exacerbation  . COPD (chronic obstructive pulmonary disease)     oxygen prn  . Sleep apnea     cpap - no longer uses  . Stroke     x2 -- effected vision; left sided slightly weak  . Rash     on upper extremities  . Bowel trouble     having constipation and diarrhea  . Family history of anesthesia complication     daughter had same "sore all over" experience with anesthesia  . AVM (arteriovenous malformation) of small bowel, acquired     2012 capsule endoscopy   Past Surgical History  Procedure Laterality Date  . Appendectomy    . Oophorectomy    . Incision and drainage of cellulitis  27/0350    Umbilical abcess  . Cardiac catheterization      Pulmonary HTN, non obstructive CAD  . Mole removal      on leg  . Vaginal hysterectomy    . Colonoscopy    . Bascilic vein transposition Left     Left upper arm  . Amputation Right 01/11/2014    Procedure: AMPUTATION ABOVE KNEE-RIGHT;  Surgeon: Conrad Christiansburg, MD;  Location: [facility];  Service: Vascular;  Laterality: Right;  . Esophagogastroduodenoscopy    . Givens capsule study     Family History  Problem Relation Age of Onset  . Breast cancer Mother   . Cancer Mother     breast  . Heart disease Father     MI  . Heart attack Father   . Breast cancer Sister   . Cancer Sister     breast  . Peripheral vascular disease Sister   . Heart disease Brother     CABG 5 vessel  . Rheum arthritis Brother   . Colon cancer Neg Hx    History  Substance Use Topics  . Smoking status: Never Smoker   . Smokeless tobacco: Never Used  . Alcohol Use: No   OB History   Grav Para Term Preterm Abortions TAB SAB Ect Mult Living                 Review of Systems  Constitutional: Positive for fever and chills.  Respiratory: Positive for cough.   Gastrointestinal: Positive for diarrhea. Negative for nausea and vomiting.  Genitourinary: Negative for dysuria.  All other systems reviewed and are negative.      Allergies  Adhesive and  Aspirin  Home Medications   Current Outpatient Rx  Name  Route  Sig  Dispense  Refill  . acetaminophen (TYLENOL) 325 MG tablet   Oral   Take 650 mg by mouth every 4 (four) hours as needed. For pain.         Marland Kitchen allopurinol (ZYLOPRIM) 100 MG tablet   Oral   Take 1 tablet (100 mg total) by mouth every other day.   30 tablet   1   . ALPRAZolam (XANAX) 0.25 MG tablet   Oral   Take 1 tablet (0.25 mg total) by mouth 2 (two) times daily.   60 tablet   1   . calcitRIOL (ROCALTROL) 0.5 MCG capsule   Oral   Take 1 capsule (0.5 mcg total) by mouth every other day.   30 capsule   1   . HYDROcodone-acetaminophen (NORCO/VICODIN) 5-325 MG per tablet   Oral   Take 1-2 tablets by mouth every 6 (six) hours as needed for moderate pain.   60 tablet   0   . levothyroxine (SYNTHROID, LEVOTHROID) 75 MCG tablet   Oral   Take 1 tablet (75 mcg total) by mouth daily before breakfast.   30 tablet   1   . losartan (COZAAR) 25 MG tablet   Oral   Take 1 tablet (25 mg total) by mouth daily.   30 tablet   1   . metolazone (ZAROXOLYN) 2.5 MG tablet   Oral   Take 1 tablet (2.5 mg total) by mouth every other day.   30 tablet   1   . pantoprazole (PROTONIX) 40 MG tablet   Oral   Take 1 tablet (40 mg total) by mouth daily.   30 tablet   1   . polyethylene glycol (MIRALAX / GLYCOLAX) packet   Oral   Take 17 g by mouth daily.   14 each   0   . potassium chloride SA (K-DUR,KLOR-CON) 20 MEQ tablet   Oral   Take 1 tablet (20 mEq total) by mouth daily.   30 tablet   1   . simethicone (MYLICON) 80 MG chewable tablet   Oral   Chew 1 tablet (80 mg total) by mouth 4 (four) times daily as needed for flatulence (gas).   30 tablet   0   . torsemide (DEMADEX) 20 MG tablet   Oral   Take 7  tablets (140 mg total) by mouth daily.   30 tablet   1   . vitamin C (ASCORBIC ACID) 500 MG tablet   Oral   Take 500 mg by mouth daily.           Marland Kitchen epoetin alfa (PROCRIT) 60109 UNIT/ML injection    Subcutaneous   Inject 40,000 Units into the skin once a week.           BP 106/61  Pulse 86  Temp(Src) 99.9 F (37.7 C) (Oral)  Resp 15  SpO2 95% Physical Exam  Nursing note and vitals reviewed. Constitutional: She appears well-developed and well-nourished. No distress.  HENT:  Head: Normocephalic and atraumatic.  Mouth/Throat: No oropharyngeal exudate.  Eyes: Conjunctivae are normal. No scleral icterus.  Neck: Normal range of motion. Neck supple.  Cardiovascular: Normal rate.   Pulmonary/Chest: Effort normal. No respiratory distress. She has no wheezes.  Abdominal: Soft. There is no rebound and no guarding.  Musculoskeletal:  S/p AKA on right lower leg, staples in place, small areas along margins that are erythematous, no drainage, no red streaks along lower leg, no areas of fluctuance noted  Neurological: She is alert. Coordination normal.  Skin: Skin is warm and dry. No rash noted. She is not diaphoretic.  Psychiatric: She has a normal mood and affect.    ED Course  Procedures (including critical care time) Labs Review Labs Reviewed  CBC WITH DIFFERENTIAL - Abnormal; Notable for the following:    RBC 2.67 (*)    Hemoglobin 8.5 (*)    HCT 27.8 (*)    MCV 104.1 (*)    RDW 17.1 (*)    Neutrophils Relative % 86 (*)    Lymphocytes Relative 7 (*)    Lymphs Abs 0.5 (*)    All other components within normal limits  COMPREHENSIVE METABOLIC PANEL - Abnormal; Notable for the following:    Chloride 91 (*)    BUN 100 (*)    Creatinine, Ser 2.44 (*)    Albumin 2.3 (*)    GFR calc non Af Amer 17 (*)    GFR calc Af Amer 20 (*)    All other components within normal limits  URINALYSIS, ROUTINE W REFLEX MICROSCOPIC - Abnormal; Notable for the following:    APPearance CLOUDY (*)    Leukocytes, UA LARGE (*)    All other components within normal limits  URINE MICROSCOPIC-ADD ON - Abnormal; Notable for the following:    Bacteria, UA MANY (*)    All other components within  normal limits  CULTURE, BLOOD (ROUTINE X 2)  CULTURE, BLOOD (ROUTINE X 2)  URINE CULTURE  I-STAT CG4 LACTIC ACID, ED   Imaging Review Dg Chest Port 1 View  02/01/2014   CLINICAL DATA:  Fever, cough, weakness and shortness of breath.  EXAM: PORTABLE CHEST - 1 VIEW  COMPARISON:  Chest radiograph from 01/11/2014  FINDINGS: The lungs are well-aerated. Vascular congestion is noted, with mildly increased interstitial markings. Mild bibasilar airspace opacities are seen. A small left pleural effusion is suspected. Opacities may reflect atelectasis, though pneumonia cannot be excluded. No pneumothorax is identified.  The cardiomediastinal silhouette is enlarged. No acute osseous abnormalities are seen.  IMPRESSION: Vascular congestion and cardiomegaly, with mildly increased interstitial markings. Mild bibasilar airspace opacities seen; small left pleural effusion suspected. Opacities may reflect atelectasis, though pneumonia cannot be excluded, particularly given the patient's symptoms.   Electronically Signed   By: Garald Balding M.D.   On: 02/01/2014 21:48  EKG Interpretation    Date/Time:  Monday February 01 2014 18:42:29 EST Ventricular Rate:  66 PR Interval:    QRS Duration: 144 QT Interval:  427 QTC Calculation: 447 R Axis:   80 Text Interpretation:  Atrial fibrillation Right bundle branch block Anteroseptal infarct, age indeterminate Abnormal ekg No significant change since last tracing Confirmed by [facility]  MD, MICHEAL ([id number]) on 02/01/2014 8:10:40 PM           RA sat is 89% and I interpret to be inequate, and abn   Pt with cough at home, SOB, low O2 sats at home, recent surgery and fever.  Pt with  UTI, normal WBC and lactic acid, but given the hypoxia, SOB, coughing, occult pneumonia is still possible.  Will start IV abx to cover both and recommend admission.    11:02 PM Discussed with Dr. Inis Sizer who will admit   MDM   Final diagnoses:  Healthcare-associated pneumonia  UTI  (lower urinary tract infection)  Anemia    Pt with low grade fever at home.  Home health spoke to PCP office who told family to bring pt to the hospital, presumably to be admitted per the graanddaughter.      Saddie Benders. Dorna Mai, MD 02/01/14 2302

## 2014-02-01 NOTE — Progress Notes (Signed)
   CARE MANAGEMENT ED NOTE 02/01/2014  Patient:  [name], [name]   Account Number:  [id number]  Date Initiated:  02/01/2014  Documentation initiated by:  Livia Snellen  Subjective/Objective Assessment:   Patient presents to Ed with fever, cough shortness of breath.     Subjective/Objective Assessment Detail:   Patient placed on 2 liters of oxygen for pulse ox of 89%.     Action/Plan:   Chest xray completed in the ED, IV fluids started in ED   Action/Plan Detail:   Anticipated DC Date:       Status Recommendation to Physician:   Result of Recommendation:    Other ED [facility]  Other    Choice offered to / List presented to:            Status of service:  Completed, signed off  ED Comments:   ED Comments Detail:  EDCM spoke to patient and her great grand daughter [name] at bedside.  As per [name] the best person to reach for an emergency is the patient's daughter [name] [phone].  Patient reports she receives home health care with [facility] visiting RN sees patient twice a week, PT and OT.  Patient has a walker, bedside commode, wheelchair and shower chair at home.  Patient was at [facility] post amputation of right leg from Feb 2 to Jan 31 2014. Patient lives with her husband at home.  As per [name], she and her grandmother [name] are always checking in with the patient.  The patient is never alone as she lives with her husband.  Currently patient's husband isn't well.  As per [name], the family was looking into having an aide stay with her at night time.  EDCM provided patient's great grand daughter a list of private duty nursing services. Also encouraged patient to apply for Medicaid.  Patient and patient's family thank ful for resources.  No further EDCM needs at this time.

## 2014-02-01 NOTE — ED Notes (Signed)
Per EMS: Pt is from home. Pt recently had an amputation of the right leg, which was approximately two weeks ago. Pt reports fever and cough. Pt also c/o of sore buttocks.

## 2014-02-01 NOTE — Telephone Encounter (Signed)
Spoke with the pt's great-grand-daughter(Brittany) and informed her of Dr. Clayborn Heron note below.  She understood and stated that she will have to call 911 to have her taking to the hospital.  Informed her to call 911 so they can get her there as soon as possible, and she stated that she would.  She stated that her mother was not there but she will give her a call and let her know what Dr. Linna Darner wants them to do.//AB/CMA

## 2014-02-01 NOTE — Telephone Encounter (Signed)
[name] with [facility] called stating she went to the house to check on pt and pt has temp of 100.6, R lung crackles throughout, SOB when laying on L side, R side of chest hurts when coughing. BP was 120/45, heart rate 72, resp 24, o2 was 97 on room air, pain level was a 3. They do have portable xray if you think she needs chest xray. Patient's family members are also sick. Please advise.

## 2014-02-01 NOTE — Telephone Encounter (Signed)
   She was recently discharged from [facility] following an amputation for gangrene. She should be seen in the emergency room as soon as possible.

## 2014-02-02 DIAGNOSIS — I059 Rheumatic mitral valve disease, unspecified: Secondary | ICD-10-CM

## 2014-02-02 LAB — TROPONIN I
Troponin I: <0.3 ng/mL (ref <0.30)
Troponin I: <0.3 ng/mL (ref <0.30)

## 2014-02-02 LAB — INFLUENZA PANEL BY PCR (TYPE A & B)
H1N1 flu by pcr: NOT DETECTED
INFLBPCR: NEGATIVE
Influenza A By PCR: POSITIVE — AB

## 2014-02-02 LAB — BASIC METABOLIC PANEL
BUN: 88 mg/dL — ABNORMAL HIGH (ref 6–23)
CHLORIDE: 100 meq/L (ref 96–112)
CO2: 22 meq/L (ref 19–32)
Calcium: 8.2 mg/dL — ABNORMAL LOW (ref 8.4–10.5)
Creatinine, Ser: 2.06 mg/dL — ABNORMAL HIGH (ref 0.50–1.10)
GFR calc non Af Amer: 21 mL/min — ABNORMAL LOW (ref >90)
GFR, EST AFRICAN AMERICAN: 25 mL/min — AB (ref >90)
Glucose, Bld: 100 mg/dL — ABNORMAL HIGH (ref 70–99)
POTASSIUM: 3 meq/L — AB (ref 3.7–5.3)
Sodium: 140 mEq/L (ref 137–147)

## 2014-02-02 LAB — CBC
HEMATOCRIT: 26.5 % — AB (ref 36.0–46.0)
HEMOGLOBIN: 8 g/dL — AB (ref 12.0–15.0)
MCH: 31.9 pg (ref 26.0–34.0)
MCHC: 30.2 g/dL (ref 30.0–36.0)
MCV: 105.6 fL — AB (ref 78.0–100.0)
Platelets: 134 10*3/uL — ABNORMAL LOW (ref 150–400)
RBC: 2.51 MIL/uL — ABNORMAL LOW (ref 3.87–5.11)
RDW: 17.2 % — ABNORMAL HIGH (ref 11.5–15.5)
WBC: 5.7 10*3/uL (ref 4.0–10.5)

## 2014-02-02 LAB — PRO B NATRIURETIC PEPTIDE: Pro B Natriuretic peptide (BNP): 15475 pg/mL — ABNORMAL HIGH (ref 0–450)

## 2014-02-02 LAB — MRSA PCR SCREENING: MRSA by PCR: NEGATIVE

## 2014-02-02 LAB — STREP PNEUMONIAE URINARY ANTIGEN: Strep Pneumo Urinary Antigen: NEGATIVE

## 2014-02-02 MED ORDER — PANTOPRAZOLE SODIUM 40 MG PO TBEC
40.0000 mg | DELAYED_RELEASE_TABLET | Freq: Every day | ORAL | Status: DC
Start: 2014-02-02 — End: 2014-02-05
  Administered 2014-02-02 – 2014-02-05 (×4): 40 mg via ORAL
  Filled 2014-02-02 (×4): qty 1

## 2014-02-02 MED ORDER — ADULT MULTIVITAMIN W/MINERALS CH
1.0000 | ORAL_TABLET | Freq: Every day | ORAL | Status: DC
  Administered 2014-02-03 – 2014-02-05 (×3): 1 via ORAL
  Filled 2014-02-02 (×5): qty 1

## 2014-02-02 MED ORDER — ACETAMINOPHEN 325 MG PO TABS
650.0000 mg | ORAL_TABLET | ORAL | Status: DC | PRN
  Administered 2014-02-02 – 2014-02-05 (×2): 650 mg via ORAL
  Filled 2014-02-02 (×2): qty 2

## 2014-02-02 MED ORDER — ALPRAZOLAM 0.25 MG PO TABS
0.2500 mg | ORAL_TABLET | Freq: Two times a day (BID) | ORAL | Status: DC
  Administered 2014-02-02 – 2014-02-05 (×8): 0.25 mg via ORAL
  Filled 2014-02-02 (×8): qty 1

## 2014-02-02 MED ORDER — ONDANSETRON HCL 4 MG/2ML IJ SOLN: 4.0000 mg | Freq: Four times a day (QID) | INTRAMUSCULAR | Status: DC | PRN

## 2014-02-02 MED ORDER — POLYETHYLENE GLYCOL 3350 17 G PO PACK
17.0000 g | PACK | Freq: Every day | ORAL | Status: DC
  Administered 2014-02-02 – 2014-02-05 (×3): 17 g via ORAL
  Filled 2014-02-02 (×4): qty 1

## 2014-02-02 MED ORDER — VANCOMYCIN HCL 500 MG IV SOLR: 500.0000 mg | INTRAVENOUS | Status: DC

## 2014-02-02 MED ORDER — ENOXAPARIN SODIUM 30 MG/0.3ML ~~LOC~~ SOLN
30.0000 mg | SUBCUTANEOUS | Status: DC
  Administered 2014-02-02 – 2014-02-05 (×4): 30 mg via SUBCUTANEOUS
  Filled 2014-02-02 (×4): qty 0.3

## 2014-02-02 MED ORDER — RESOURCE INSTANT PROTEIN PO PWD PACKET
1.0000 | Freq: Three times a day (TID) | ORAL | Status: DC
  Administered 2014-02-02 – 2014-02-05 (×8): 6 g via ORAL
  Filled 2014-02-02 (×12): qty 6

## 2014-02-02 MED ORDER — OSELTAMIVIR PHOSPHATE 30 MG PO CAPS
30.0000 mg | ORAL_CAPSULE | Freq: Every day | ORAL | Status: DC
  Administered 2014-02-02 – 2014-02-05 (×4): 30 mg via ORAL
  Filled 2014-02-02 (×4): qty 1

## 2014-02-02 MED ORDER — SIMETHICONE 80 MG PO CHEW
80.0000 mg | CHEWABLE_TABLET | Freq: Four times a day (QID) | ORAL | Status: DC | PRN
  Administered 2014-02-03: 80 mg via ORAL
  Filled 2014-02-02 (×2): qty 1

## 2014-02-02 MED ORDER — LEVALBUTEROL HCL 0.63 MG/3ML IN NEBU
0.6300 mg | INHALATION_SOLUTION | RESPIRATORY_TRACT | Status: DC | PRN
  Administered 2014-02-02 – 2014-02-04 (×5): 0.63 mg via RESPIRATORY_TRACT
  Filled 2014-02-02 (×5): qty 3

## 2014-02-02 MED ORDER — LEVOTHYROXINE SODIUM 75 MCG PO TABS
75.0000 ug | ORAL_TABLET | Freq: Every day | ORAL | Status: DC
  Administered 2014-02-02 – 2014-02-05 (×4): 75 ug via ORAL
  Filled 2014-02-02 (×7): qty 1

## 2014-02-02 MED ORDER — SODIUM CHLORIDE 0.9 % IV SOLN: INTRAVENOUS | Status: DC

## 2014-02-02 MED ORDER — ALPRAZOLAM 0.25 MG PO TABS: 0.2500 mg | ORAL_TABLET | Freq: Two times a day (BID) | ORAL | Status: DC

## 2014-02-02 MED ORDER — POTASSIUM CHLORIDE CRYS ER 20 MEQ PO TBCR
20.0000 meq | EXTENDED_RELEASE_TABLET | Freq: Two times a day (BID) | ORAL | Status: DC
  Administered 2014-02-02 – 2014-02-05 (×6): 20 meq via ORAL
  Filled 2014-02-02 (×7): qty 1

## 2014-02-02 MED ORDER — ALBUTEROL SULFATE (2.5 MG/3ML) 0.083% IN NEBU
2.5000 mg | INHALATION_SOLUTION | Freq: Four times a day (QID) | RESPIRATORY_TRACT | Status: AC
  Administered 2014-02-02: 2.5 mg via RESPIRATORY_TRACT
  Filled 2014-02-02: qty 3

## 2014-02-02 MED ORDER — BOOST / RESOURCE BREEZE PO LIQD
1.0000 | Freq: Three times a day (TID) | ORAL | Status: DC
  Administered 2014-02-02 – 2014-02-05 (×8): 1 via ORAL

## 2014-02-02 MED ORDER — CALCITRIOL 0.5 MCG PO CAPS
0.5000 ug | ORAL_CAPSULE | ORAL | Status: DC
  Administered 2014-02-02 – 2014-02-04 (×2): 0.5 ug via ORAL
  Filled 2014-02-02 (×2): qty 1

## 2014-02-02 MED ORDER — HYDROCODONE-ACETAMINOPHEN 5-325 MG PO TABS
1.0000 | ORAL_TABLET | Freq: Four times a day (QID) | ORAL | Status: DC | PRN
  Administered 2014-02-02 (×2): 1 via ORAL
  Administered 2014-02-03 – 2014-02-05 (×6): 2 via ORAL
  Filled 2014-02-02: qty 2
  Filled 2014-02-02: qty 1
  Filled 2014-02-02 (×3): qty 2
  Filled 2014-02-02: qty 1
  Filled 2014-02-02 (×2): qty 2

## 2014-02-02 MED ORDER — BIOTENE DRY MOUTH MT LIQD
15.0000 mL | Freq: Two times a day (BID) | OROMUCOSAL | Status: DC
  Administered 2014-02-02 – 2014-02-05 (×7): 15 mL via OROMUCOSAL

## 2014-02-02 MED ORDER — SODIUM CHLORIDE 0.9 % IV SOLN
INTRAVENOUS | Status: DC
  Administered 2014-02-02 – 2014-02-05 (×3): via INTRAVENOUS

## 2014-02-02 MED ORDER — DEXTROSE 5 % IV SOLN
1.0000 g | INTRAVENOUS | Status: DC
  Administered 2014-02-02 (×2): 1 g via INTRAVENOUS
  Filled 2014-02-02 (×2): qty 1

## 2014-02-02 MED ORDER — CEFEPIME HCL 1 G IJ SOLR
1.0000 g | Freq: Three times a day (TID) | INTRAMUSCULAR | Status: DC
  Filled 2014-02-02 (×2): qty 1

## 2014-02-02 MED ORDER — ALLOPURINOL 100 MG PO TABS
100.0000 mg | ORAL_TABLET | ORAL | Status: DC
  Administered 2014-02-02 – 2014-02-04 (×2): 100 mg via ORAL
  Filled 2014-02-02 (×2): qty 1

## 2014-02-02 NOTE — Progress Notes (Signed)
9:04 AM I agree with HPI/GPe and A/P per Dr. 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 Flu She does not feel well today Granddaughter and great-granddaughter at bedside give most of history They are not aware of any COPD diagnosis although it is specifically noted in the chart. She has not had any increase in her volume status recently and appears to be on torsemide 140 mg daily, metolazone 2.5 q. other day-she takes l There's not been any reported increasing lower some swelling or weight.  HEENT Body mass index is 22.37 kg/(m^2). CHEST-crackles left posterior lower lung field CARDIAC-S1-S2 no murmur rub or gallop ABDOMEN-soft NEURO- grossly intact SKIN/MUSCULAR-BKA right side seems clean although there does appear to be some erythema no wheezing no tenderness  Patient Active Problem List   Diagnosis Date Noted  . Healthcare-associated pneumonia 02/01/2014  . UTI (lower urinary tract infection) 02/01/2014  . Hypotension 02/01/2014  . CKD (chronic kidney disease) 02/01/2014  . Atherosclerosis of abdominal aorta 01/22/2014  . Abdominal pain, chronic post-prandial, epigastric/periumbilical 91/91/6606  . Loss of weight 01/22/2014  .  S/P AKA (above knee amputation) 01/14/2014  . Protein-calorie malnutrition, severe 01/13/2014  . Atherosclerosis of artery of extremity with rest pain 01/11/2014  . Swelling of limb 12/25/2013  . Bilateral leg pain 12/25/2013  . Atherosclerosis of native arteries of the extremities with ulceration(440.23) 12/25/2013  . Venous stasis ulcer of right lower extremity 12/25/2013  . End stage renal disease 06/18/2012  . CONSTIPATION 02/13/2011  . NAUSEA AND VOMITING 11/27/2010  . Gastric ulcer, unspecified as acute or chronic, without mention of hemorrhage, perforation, or obstruction 09/25/2010  . Chronic kidney disease (CKD) stage G4/A2, severely decreased glomerular filtration rate (GFR) between 15-29 mL/min/1.73 square meter and albuminuria creatinine ratio between 30-299 mg/g 10/31/2009  . HYPERLIPIDEMIA-MIXED 07/22/2009  . CAROTID BRUITS, BILATERAL 05/25/2009  . HEMORRHOIDS, INTERNAL, WITH COMPLICATION 00/45/9977  . EXTERNAL HEMORRHOIDS, WITH COMPLICATION 41/42/3953  . ANEMIA-IRON DEFICIENCY 08/09/2008  . COLONIC POLYPS, ADENOMATOUS, HX OF 08/05/2008  . Blood in Stool 07/16/2008  . Atrial fibrillation 07/11/2008  . CHF 01/16/2008  . GERD 01/16/2008  . OBSTRUCTIVE SLEEP APNEA 10/31/2007  . Hx of gout 06/19/2007  . Unspecified Anemia 06/19/2007  . HYPOTHYROIDISM 01/09/2007  . HYPERTENSION 01/09/2007  . CVA 01/09/2007  . PEPTIC ULCER, ACUTE, HEMORRHAGE, HX OF 01/09/2007   1. Multifactorial dyspnea stage II-potentially secondary to combination of CHF, COPD, most likely etiology is viral pneumonitis/hospital-acquired pneumonia-agree with continuation of broad-spectrum antibiotics for now, flu swab pending, will aim to keep weights even, last documented weight = 48.7 01/22/14 so she appears to be euvolemic at this time.  Repeat chest x-ray 1 view 2/25 a.m. 2. Potential pneumonia, viral pneumonitis-get to PCR stat, continue empiric coverage cefepime, vancomycin for now-labs ordered for  a.m. 3. Compensated HFrEF-see above.  Given hypotension her torsemide and metolazone has been held.  Cautious IV fluid 50 cc an hour 4. Hypotension-unclear etiology-probably effect of diuretics?.  Losartan 25 daily on hold, diuretics Zaroxolyn 2.5 every other day on hold, Demadex on hold 5. Acute kidney injury/ckd 3-4-baseline creatinine typically in the 3 range. Renal dose medications 6. Atrial fibrillation not candidate for anticoagulation-monitor. Seems to be in sinus rhythm right now. Monitor on telemetry. Does not appear to be on any rate controlling agent? 7. Hypothyroidism-continue Synthroid.  Check TSH this admission 8. History CVA 2002-careful monitoring.  Family needs to discuss with PCP regarding risk of stroke in setting of A. Fib 9. Chronic anemia secondary to renal disease-continue erythropoietin when necessary   Full discussion with family Stepped-down today   Verneita Griffes, MD [facility] [phone]

## 2014-02-02 NOTE — H&P (Signed)
[facility] History and Physical  [name] [id number] DOB: 1929-02-10 DOA: 02/01/2014  Referring physician: EDP PCP: Unice Cobble, MD   Chief Complaint: Cough and fevers  HPI: [name] is a 80 y.o. female with multiple medical problems including hypertension, diastolic CHF, COPD, atrial fibrillation CKD followed by Dr. Hassell Done, CVA last hospitalized earlier in February and status post right AKA on 2/2 and was in [place] till she was discharged about 5 days ago and presents with above complaints. She states that yesterday she developed cough, with subjective fevers and today became short of breath and so was brought to the ED. She denies chest pain, leg swelling and no PND reported. Per EDP had temp of 100.6, chest x-ray showed vascular congestion is mildly increased interstitial markings and mild bibasilar airspace opacities, and she was desatting in the 80s, lactic acid level I.4. Urinalysis is consistent with a UTI and she was hypotensive with systolic in the [id number]. She was started on IV fluids and was admitted for further evaluation and management.    Review of Systems The patient denies anorexia, weight loss,, vision loss, decreased hearing, hoarseness, chest pain, syncope, peripheral edema, balance deficits, hemoptysis, abdominal pain, melena, hematochezia, severe indigestion/heartburn, hematuria, incontinence, genital sores, muscle weakness, suspicious skin lesions, transient blindness, difficulty walking, depression, unusual weight change, abnormal bleeding   Past Medical History  Diagnosis Date  . Restrictive cardiomyopathy   . Atrial fibrillation 1996    S/P cardioversion  . CVA (cerebral infarction) 1999  . CPAP (continuous positive airway pressure) dependence   . Anemia     NOS  . Iron deficiency   . Gout   . Peptic ulcer, acute with hemorrhage 2005, 2011  . Hypertension   . Hypothyroidism   . Diastolic dysfunction   . Adenomatous colon polyp   . GERD  (gastroesophageal reflux disease)   . Hiatal hernia   . Diverticulosis   . Anxiety disorder   . Arthritis   . Internal and external hemorrhoids without complication   . Morbid obesity   . CAD (coronary artery disease) 08/06/2008    One vessel by cath, EF overall preserved by echo 04/30/2008  . On home oxygen therapy   . Renal failure     Dr. Hassell Done  . Hyperlipidemia   . CHF (congestive heart failure)   . Complication of anesthesia     pt states she was sore "all-over" for over a week after anesthesia  . COAD (chronic obstructive airways disease) 2007    Exacerbation  . COPD (chronic obstructive pulmonary disease)     oxygen prn  . Sleep apnea     cpap - no longer uses  . Stroke     x2 -- effected vision; left sided slightly weak  . Rash     on upper extremities  . Bowel trouble     having constipation and diarrhea  . Family history of anesthesia complication     daughter had same "sore all over" experience with anesthesia  . AVM (arteriovenous malformation) of small bowel, acquired     2012 capsule endoscopy   Past Surgical History  Procedure Laterality Date  . Appendectomy    . Oophorectomy    . Incision and drainage of cellulitis  [id number]    Umbilical abcess  . Cardiac catheterization      Pulmonary HTN, non obstructive CAD  . Mole removal      on leg  . Vaginal hysterectomy    . Colonoscopy    .  Bascilic vein transposition Left     Left upper arm  . Amputation Right 01/11/2014    Procedure: AMPUTATION ABOVE KNEE-RIGHT;  Surgeon: Conrad Queen Anne, MD;  Location: [place];  Service: Vascular;  Laterality: Right;  . Esophagogastroduodenoscopy    . Givens capsule study     Social History:  reports that she has never smoked. She has never used smokeless tobacco. She reports that she does not drink alcohol or use illicit drugs.  Allergies  Allergen Reactions  . Adhesive [Tape]   . Aspirin Other (See Comments)    Tendency to bleed easily    Family History  Problem  Relation Age of Onset  . Breast cancer Mother   . Cancer Mother     breast  . Heart disease Father     MI  . Heart attack Father   . Breast cancer Sister   . Cancer Sister     breast  . Peripheral vascular disease Sister   . Heart disease Brother     CABG 5 vessel  . Rheum arthritis Brother   . Colon cancer Neg Hx      Prior to Admission medications   Medication Sig Start Date End Date Taking? Authorizing Provider  acetaminophen (TYLENOL) 325 MG tablet Take 650 mg by mouth every 4 (four) hours as needed. For pain.   Yes Historical Provider, MD  allopurinol (ZYLOPRIM) 100 MG tablet Take 1 tablet (100 mg total) by mouth every other day. 01/27/14  Yes Daniel J Angiulli, PA-C  ALPRAZolam (XANAX) 0.25 MG tablet Take 1 tablet (0.25 mg total) by mouth 2 (two) times daily. 01/27/14  Yes Daniel J Angiulli, PA-C  calcitRIOL (ROCALTROL) 0.5 MCG capsule Take 1 capsule (0.5 mcg total) by mouth every other day. 01/27/14  Yes Daniel J Angiulli, PA-C  HYDROcodone-acetaminophen (NORCO/VICODIN) 5-325 MG per tablet Take 1-2 tablets by mouth every 6 (six) hours as needed for moderate pain. 01/27/14  Yes Daniel J Angiulli, PA-C  levothyroxine (SYNTHROID, LEVOTHROID) 75 MCG tablet Take 1 tablet (75 mcg total) by mouth daily before breakfast. 01/27/14  Yes Daniel J Angiulli, PA-C  losartan (COZAAR) 25 MG tablet Take 1 tablet (25 mg total) by mouth daily. 01/27/14  Yes Daniel J Angiulli, PA-C  metolazone (ZAROXOLYN) 2.5 MG tablet Take 1 tablet (2.5 mg total) by mouth every other day. 01/27/14  Yes Daniel J Angiulli, PA-C  pantoprazole (PROTONIX) 40 MG tablet Take 1 tablet (40 mg total) by mouth daily. 01/27/14  Yes Daniel J Angiulli, PA-C  polyethylene glycol (MIRALAX / GLYCOLAX) packet Take 17 g by mouth daily. 01/27/14  Yes Daniel J Angiulli, PA-C  potassium chloride SA (K-DUR,KLOR-CON) 20 MEQ tablet Take 1 tablet (20 mEq total) by mouth daily. 01/27/14  Yes Daniel J Angiulli, PA-C  simethicone (MYLICON) 80 MG  chewable tablet Chew 1 tablet (80 mg total) by mouth 4 (four) times daily as needed for flatulence (gas). 01/27/14  Yes Daniel J Angiulli, PA-C  torsemide (DEMADEX) 20 MG tablet Take 7 tablets (140 mg total) by mouth daily. 01/27/14  Yes Daniel J Angiulli, PA-C  vitamin C (ASCORBIC ACID) 500 MG tablet Take 500 mg by mouth daily.     Yes Historical Provider, MD  epoetin alfa (PROCRIT) 03704 UNIT/ML injection Inject 40,000 Units into the skin once a week.     Historical Provider, MD   Physical Exam: Filed Vitals:   02/01/14 2330  BP: 96/30  Pulse:   Temp:   Resp: 20    BP  96/30  Pulse 86  Temp(Src) 99.9 F (37.7 C) (Oral)  Resp 20  SpO2 95% Constitutional: Vital signs reviewed.  Patient is a well-developed, thin elderly female in no acute distress and cooperative with exam. somnolent but easily aroused and oriented x3.  Head: Normocephalic and atraumatic Mouth: no erythema or exudates, dry MM Eyes: PERRL, EOMI, conjunctivae normal, No scleral icterus.  Neck: Supple, Trachea midline normal ROM, No JVD, mass, thyromegaly, or carotid bruit present.  Cardiovascular: Irregular, rate controlled, S1 normal, S2 normal, no MRG, pulses symmetric and intact bilaterally Pulmonary/Chest: Decreased breath sounds at bases, no crackles or wheezes Abdominal: Soft. Non-tender, non-distended, bowel sounds are normal, no masses, organomegaly, or guarding present.  GU: no CVA tenderness  Extremities: Right AKA stump with staples, no drainage, no edema. Left with no cyanosis and no edema  Neurological: A&O x3, cranial nerve II-XII are grossly intact, no focal motor deficit, sensory grossly intact .  Skin: Warm, dry and intact. No rash, cyanosis, or clubbing.  Psychiatric: Normal mood and affect. speech and behavior is normal.               Labs on Admission:  Basic Metabolic Panel:  Recent Labs Lab 02/01/14 1710  NA 139  K 3.7  CL 91*  CO2 32  GLUCOSE 93  BUN 100*  CREATININE 2.44*   CALCIUM 9.1   Liver Function Tests:  Recent Labs Lab 02/01/14 1710  AST 18  ALT 8  ALKPHOS 81  BILITOT 0.4  PROT 6.0  ALBUMIN 2.3*   No results found for this basename: LIPASE, AMYLASE,  in the last 168 hours No results found for this basename: AMMONIA,  in the last 168 hours CBC:  Recent Labs Lab 02/01/14 1710  WBC 6.7  NEUTROABS 5.7  HGB 8.5*  HCT 27.8*  MCV 104.1*  PLT 163   Cardiac Enzymes: No results found for this basename: CKTOTAL, CKMB, CKMBINDEX, TROPONINI,  in the last 168 hours  BNP (last 3 results)  Recent Labs  02/09/13 1146  PROBNP 222.0*   CBG: No results found for this basename: GLUCAP,  in the last 168 hours  Radiological Exams on Admission: Dg Chest Port 1 View  02/01/2014   CLINICAL DATA:  Fever, cough, weakness and shortness of breath.  EXAM: PORTABLE CHEST - 1 VIEW  COMPARISON:  Chest radiograph from 01/11/2014  FINDINGS: The lungs are well-aerated. Vascular congestion is noted, with mildly increased interstitial markings. Mild bibasilar airspace opacities are seen. A small left pleural effusion is suspected. Opacities may reflect atelectasis, though pneumonia cannot be excluded. No pneumothorax is identified.  The cardiomediastinal silhouette is enlarged. No acute osseous abnormalities are seen.  IMPRESSION: Vascular congestion and cardiomegaly, with mildly increased interstitial markings. Mild bibasilar airspace opacities seen; small left pleural effusion suspected. Opacities may reflect atelectasis, though pneumonia cannot be excluded, particularly given the patient's symptoms.   Electronically Signed   By: Garald Balding M.D.   On: 02/01/2014 21:48    EKG: Independently reviewed. Atrial fibrillation, rate controlled at 66  Assessment/Plan Active Problems:  Probable Healthcare-associated pneumonia -As discussed above, patient presenting with cough, fever and chest x-ray with vascular congestion with mildly increased interstitial markings  and mild by basilar airspace opacities above. -will place on empiric antibiotics with vancomycin and cefepime -Obtain pneumonia workup as per protocol-strep pneumo and Legionella antigens, cultures and follow. -Obtain BNP, follow and further treat accordingly   UTI (lower urinary tract infection) -Empiric antibiotics with cefepime, follow urine cultures  Hypotension/sepsis syndrome -Secondary to above, empiric antibiotics as above follow cultures and further treat accordingly -IVF,Hold diuretics secondary to hypotension and follow   CKD (chronic kidney disease) -Creatinine stable, follow.   HYPOTHYROIDISM  -Continue Synthroid   Atrial fibrillation -Rate controlled, on no meds-follow   History of diastolic CHF -Check BNP, obtain echo, monitor fluid status closely -Holding diuretics secondary to hypotension as above follow and resume when clinically appropriate   S/P right AKA (above knee amputation)-on 2/2 per Dr. Bridgett Larsson -Follow and notify vascular admission in a.m. History of COPD -Stable, when necessary nebs follow. Continue supplemental oxygen History of CAD -She is chest pain free, follow troponins as above       Code Status: Full code Family Communication: Granddaughter at bedside Disposition Plan: Admit to step down unit  Time spent: >82mins  [facility] Pager [phone]

## 2014-02-02 NOTE — Progress Notes (Signed)
Echo Lab  2D Echocardiogram completed.  [place], [place] 02/02/2014 12:31 PM

## 2014-02-02 NOTE — Progress Notes (Signed)
INITIAL NUTRITION ASSESSMENT  Pt meets criteria for severe MALNUTRITION in the context of chronic illness as evidenced by severe muscle wasting and subcutaneous fat loss in face, upper body, and upper extremities.  DOCUMENTATION CODES Per approved criteria  -Severe malnutrition in the context of chronic illness   INTERVENTION: - Recommend MD replace pt's low potassium  - Resource Breeze TID - Beneprotein TID for RN/pt to mix with juice - Encouraged increased meal intake - Assisted pt with ordering snacks - Multivitamin 1 tablet PO daily - Will continue to monitor   NUTRITION DIAGNOSIS: Inadequate oral intake related to poor appetite as evidenced by <25% meal intake.   Goal: Pt to consume >90% of meals/supplements  Monitor:  Weights, labs, intake  Reason for Assessment: Malnutrition screening tool   80 y.o. female  Admitting Dx: Cough and fevers   ASSESSMENT: Pt with history of hypertension, diastolic CHF, COPD, atrial fibrillation CKD followed by Dr. Hassell Done, CVA last hospitalized earlier in February and status post right AKA on 01/11/14 and was in [place] till she was discharged about 5 days ago. She states that yesterday she developed cough, with subjective fevers and today became short of breath and so was brought to the ED.  Met with pt and grand-daughter who report pt with poor appetite in the past month with pt consuming only small amounts of food at mealtimes. Pt's weight down 10 pounds in the past month however during the past month pt had her right AKA. Pt on Resource Breeze during her stay at [facility] earlier this month which pt enjoyed, will order. Denies any problems chewing or swallowing. Only ate a few bites of her breakfast tray. Attempted to do nutrition focused physical exam however pt fell asleep. RD notes pt with severe muscle wasting in face, upper body, and upper extremities.  Potassium low BUN/Cr elevated  with low GFR   Height: Ht Readings from Last 1 Encounters:  02/02/14 _0  (1.473 m)    Weight: Wt Readings from Last 1 Encounters:  02/02/14 97 lb 14.2 oz (44.4 kg)    Ideal Body Weight: 81 lb adjusted for right AKA  % Ideal Body Weight: 120%  Wt Readings from Last 10 Encounters:  02/02/14 97 lb 14.2 oz (44.4 kg)  01/20/14 107 lb 5.8 oz (48.7 kg)  01/12/14 108 lb 11 oz (49.3 kg)  01/12/14 108 lb 11 oz (49.3 kg)  12/31/13 110 lb (49.896 kg)  12/31/13 110 lb (49.896 kg)  12/25/13 111 lb (50.349 kg)  12/09/13 111 lb (50.349 kg)  10/01/13 117 lb 12.8 oz (53.434 kg)  03/13/13 139 lb (63.05 kg)    Usual Body Weight: 107 lb   % Usual Body Weight: 91%  BMI:  Body mass index is 20.46 kg/(m^2).  Estimated Nutritional Needs: Kcal: 1300-1500 Protein: 55-65g Fluid: 1.3-1.5L/day  Skin: Stage 2 coccyx pressure ulcer, right AKA  Diet Order: Carb Control  EDUCATION NEEDS: -No education needs identified at this time   Intake/Output Summary (Last 24 hours) at 02/02/14 1059 Last data filed at 02/02/14 1000  Gross per 24 hour  Intake    100 ml  Output      0 ml  Net    100 ml    Last BM: 2/24  Labs:   Recent Labs Lab 02/01/14 1710 02/02/14 0600  NA 139 140  K 3.7 3.0*  CL 91* 100  CO2 32 22  BUN 100* 88*  CREATININE 2.44* 2.06*  CALCIUM  9.1 8.2*  GLUCOSE 93 100*    CBG (last 3)  No results found for this basename: GLUCAP,  in the last 72 hours  Scheduled Meds: . albuterol  2.5 mg Nebulization Q6H  . allopurinol  100 mg Oral QODAY  . ALPRAZolam  0.25 mg Oral BID  . antiseptic oral rinse  15 mL Mouth Rinse BID  . calcitRIOL  0.5 mcg Oral QODAY  . ceFEPime (MAXIPIME) IV  1 g Intravenous Q24H  . enoxaparin (LOVENOX) injection  30 mg Subcutaneous Q24H  . levothyroxine  75 mcg Oral QAC breakfast  . pantoprazole  40 mg Oral Daily  . polyethylene glycol  17 g Oral Daily  . [START ON 02/04/2014] vancomycin  500 mg Intravenous Q48H    Continuous  Infusions: . sodium chloride 50 mL/hr at 02/02/14 1096    Past Medical History  Diagnosis Date  . Restrictive cardiomyopathy   . Atrial fibrillation 1996    S/P cardioversion  . CVA (cerebral infarction) 1999  . CPAP (continuous positive airway pressure) dependence   . Anemia     NOS  . Iron deficiency   . Gout   . Peptic ulcer, acute with hemorrhage 2005, 2011  . Hypertension   . Hypothyroidism   . Diastolic dysfunction   . Adenomatous colon polyp   . GERD (gastroesophageal reflux disease)   . Hiatal hernia   . Diverticulosis   . Anxiety disorder   . Arthritis   . Internal and external hemorrhoids without complication   . Morbid obesity   . CAD (coronary artery disease) 08/06/2008    One vessel by cath, EF overall preserved by echo 04/30/2008  . On home oxygen therapy   . Renal failure     Dr. Hassell Done  . Hyperlipidemia   . CHF (congestive heart failure)   . Complication of anesthesia     pt states she was sore "all-over" for over a week after anesthesia  . COAD (chronic obstructive airways disease) 2007    Exacerbation  . COPD (chronic obstructive pulmonary disease)     oxygen prn  . Sleep apnea     cpap - no longer uses  . Stroke     x2 -- effected vision; left sided slightly weak  . Rash     on upper extremities  . Bowel trouble     having constipation and diarrhea  . Family history of anesthesia complication     daughter had same "sore all over" experience with anesthesia  . AVM (arteriovenous malformation) of small bowel, acquired     2012 capsule endoscopy    Past Surgical History  Procedure Laterality Date  . Appendectomy    . Oophorectomy    . Incision and drainage of cellulitis  03/5408    Umbilical abcess  . Cardiac catheterization      Pulmonary HTN, non obstructive CAD  . Mole removal      on leg  . Vaginal hysterectomy    . Colonoscopy    . Bascilic vein transposition Left     Left upper arm  . Amputation Right 01/11/2014    Procedure:  AMPUTATION ABOVE KNEE-RIGHT;  Surgeon: Conrad Covington, MD;  Location: [place];  Service: Vascular;  Laterality: Right;  . Esophagogastroduodenoscopy    . Givens capsule study      Mikey College MS, [place], [place] Pager [phone] After Hours Pager

## 2014-02-02 NOTE — Progress Notes (Signed)
CARE MANAGEMENT NOTE 02/02/2014  Patient:  [name], [name]   Account Number:  [id number]  Date Initiated:  02/02/2014  Documentation initiated by:  DAVIS,RHONDA  Subjective/Objective Assessment:   pt with recent hx of tkr now presents with po0ss sepsis versus early pna/bnp evelated, low grade temp,     Action/Plan:   from home lives with her spouse,has [facility] after [facility].   Anticipated DC Date:  02/05/2014   Anticipated DC Plan:  [place]  In-house referral  NA      DC Planning Services  CM consult      [facility] Choice  Resumption Of Svcs/PTA Provider   Choice offered to / List presented to:  NA   DME arranged  NA      DME agency  [place] arranged  NA      [place].   Status of service:  In process, will continue to follow Medicare Important Message given?  NA - LOS <3 / Initial given by admissions (If response is "NO", the following Medicare IM given date fields will be blank) Date Medicare IM given:   Date Additional Medicare IM given:    Discharge Disposition:    Per UR Regulation:  Reviewed for med. necessity/level of care/duration of stay  If discussed at [place] of Stay Meetings, dates discussed:    Comments:  [id number]/Rhonda Eldridge Dace, [place], [place] [phone] Chart Reviewed for discharge and hospital needs. Discharge needs at time of review:  None present will follow for needs. Review of patient progress due on [id number].

## 2014-02-02 NOTE — Progress Notes (Signed)
ANTIBIOTIC CONSULT NOTE - INITIAL  Pharmacy Consult for vancomycin, cefepime Indication: pneumonia  Allergies  Allergen Reactions  . Adhesive [Tape]   . Aspirin Other (See Comments)    Tendency to bleed easily    Patient Measurements: Height: 4\' 10"  (147.3 cm) Weight: 107 lb (48.535 kg) IBW/kg (Calculated) : 40.9 Adjusted Body Weight:   Vital Signs: Temp: 99.9 F (37.7 C) (02/23 1842) Temp src: Oral (02/23 1842) BP: 152/48 mmHg (02/24 0457) Pulse Rate: 86 (02/24 0457) Intake/Output from previous day:   Intake/Output from this shift:    Labs:  Recent Labs  02/01/14 1710  WBC 6.7  HGB 8.5*  PLT 163  CREATININE 2.44*   Estimated Creatinine Clearance: 11.5 ml/min (by C-G formula based on Cr of 2.44). No results found for this basename: VANCOTROUGH, Corlis Leak, VANCORANDOM, [place], GENTPEAK, GENTRANDOM, TOBRATROUGH, TOBRAPEAK, TOBRARND, AMIKACINPEAK, AMIKACINTROU, AMIKACIN,  in the last 72 hours   Microbiology: Recent Results (from the past 720 hour(s))  MRSA PCR SCREENING     Status: None   Collection Time    01/11/14  7:52 PM      Result Value Ref Range Status   MRSA by PCR NEGATIVE  NEGATIVE Final   Comment:            The GeneXpert MRSA Assay (FDA     approved for NASAL specimens     only), is one component of a     comprehensive MRSA colonization     surveillance program. It is not     intended to diagnose MRSA     infection nor to guide or     monitor treatment for     MRSA infections.    Medical History: Past Medical History  Diagnosis Date  . Restrictive cardiomyopathy   . Atrial fibrillation 1996    S/P cardioversion  . CVA (cerebral infarction) 1999  . CPAP (continuous positive airway pressure) dependence   . Anemia     NOS  . Iron deficiency   . Gout   . Peptic ulcer, acute with hemorrhage 2005, 2011  . Hypertension   . Hypothyroidism   . Diastolic dysfunction   . Adenomatous colon polyp   . GERD (gastroesophageal reflux disease)    . Hiatal hernia   . Diverticulosis   . Anxiety disorder   . Arthritis   . Internal and external hemorrhoids without complication   . Morbid obesity   . CAD (coronary artery disease) 08/06/2008    One vessel by cath, EF overall preserved by echo 04/30/2008  . On home oxygen therapy   . Renal failure     Dr. Hassell Done  . Hyperlipidemia   . CHF (congestive heart failure)   . Complication of anesthesia     pt states she was sore "all-over" for over a week after anesthesia  . COAD (chronic obstructive airways disease) 2007    Exacerbation  . COPD (chronic obstructive pulmonary disease)     oxygen prn  . Sleep apnea     cpap - no longer uses  . Stroke     x2 -- effected vision; left sided slightly weak  . Rash     on upper extremities  . Bowel trouble     having constipation and diarrhea  . Family history of anesthesia complication     daughter had same "sore all over" experience with anesthesia  . AVM (arteriovenous malformation) of small bowel, acquired     2012 capsule endoscopy    Medications:  Anti-infectives  Start     Dose/Rate Route Frequency Ordered Stop   02/04/14 0600  vancomycin (VANCOCIN) 500 mg in sodium chloride 0.9 % 100 mL IVPB     500 mg 100 mL/hr over 60 Minutes Intravenous Every 48 hours 02/02/14 0544     02/02/14 2200  ceFEPIme (MAXIPIME) 1 g in dextrose 5 % 50 mL IVPB     1 g 100 mL/hr over 30 Minutes Intravenous Every 24 hours 02/02/14 0544     02/02/14 0115  ceFEPIme (MAXIPIME) 1 g in dextrose 5 % 50 mL IVPB  Status:  Discontinued     1 g 100 mL/hr over 30 Minutes Intravenous 3 times per day 02/02/14 0104 02/02/14 0543   02/01/14 2300  vancomycin (VANCOCIN) 1,000 mg in sodium chloride 0.9 % 250 mL IVPB     1,000 mg 250 mL/hr over 60 Minutes Intravenous  Once 02/01/14 2246 02/02/14 0129   02/01/14 2245  ceFEPIme (MAXIPIME) 1 g in dextrose 5 % 50 mL IVPB     1 g 100 mL/hr over 30 Minutes Intravenous  Once 02/01/14 2238 02/01/14 2341   02/01/14 2130   cefTRIAXone (ROCEPHIN) 1 g in dextrose 5 % 50 mL IVPB     1 g 100 mL/hr over 30 Minutes Intravenous  Once 02/01/14 2122 02/01/14 2156     Assessment: Patient with PNA.  First dose of antibiotics already given.  Patient with poor renal function.  Goal of Therapy:  Vancomycin trough level 15-20 mcg/ml Cefepime dosed based on patient weight and renal function   Plan:  Measure antibiotic drug levels at steady state Follow up culture results Vancomycin 500mg  iv q48hr Cefepime 1gm iv q24hr  Nani Skillern Crowford 02/02/2014,5:45 AM

## 2014-02-03 ENCOUNTER — Ambulatory Visit: Payer: Medicare Other | Admitting: Internal Medicine

## 2014-02-03 DIAGNOSIS — Z0289 Encounter for other administrative examinations: Secondary | ICD-10-CM

## 2014-02-03 DIAGNOSIS — J09X2 Influenza due to identified novel influenza A virus with other respiratory manifestations: Secondary | ICD-10-CM

## 2014-02-03 DIAGNOSIS — I4891 Unspecified atrial fibrillation: Secondary | ICD-10-CM

## 2014-02-03 LAB — EXPECTORATED SPUTUM ASSESSMENT W GRAM STAIN, RFLX TO RESP C

## 2014-02-03 LAB — LEGIONELLA ANTIGEN, URINE: LEGIONELLA ANTIGEN, URINE: NEGATIVE

## 2014-02-03 LAB — EXPECTORATED SPUTUM ASSESSMENT W REFEX TO RESP CULTURE

## 2014-02-03 LAB — CLOSTRIDIUM DIFFICILE BY PCR: CDIFFPCR: NEGATIVE

## 2014-02-03 MED ORDER — LEVOFLOXACIN 500 MG PO TABS
500.0000 mg | ORAL_TABLET | ORAL | Status: DC
  Administered 2014-02-05: 500 mg via ORAL
  Filled 2014-02-03: qty 1

## 2014-02-03 MED ORDER — LEVOFLOXACIN IN D5W 750 MG/150ML IV SOLN
750.0000 mg | Freq: Once | INTRAVENOUS | Status: AC
  Administered 2014-02-03: 750 mg via INTRAVENOUS
  Filled 2014-02-03: qty 150

## 2014-02-03 NOTE — Progress Notes (Signed)
[facility]  Patient Status: Active (receiving services up to time of hospitalization)  [facility] is providing the following services: RN, PT and OT  If patient discharges after hours, please call [phone].   [name] 02/03/2014, 10:49 AM

## 2014-02-03 NOTE — Progress Notes (Signed)
[facility] PROGRESS NOTE  [name] WYO:[id number] DOB: August 05, 1929 DOA: 02/01/2014 PCP: Unice Cobble, MD  Assessment/Plan: Flu/?HCAP -Continue tamiflu for 5 days. -Not convinced she has true bacterial PNA. -Will de-escalate antibiotics to levaquin.  UTI -Continue levaquin pending cx data.  CKD (chronic kidney disease)  -Creatinine stable, follow.   HYPOTHYROIDISM  -Continue Synthroid   Atrial fibrillation  -Rate controlled, on no meds-follow   History of diastolic CHF  -Appears compensated at this time. -Holding diuretics secondary to hypotension as above follow and resume when clinically appropriate   S/P right AKA (above knee amputation)-on 2/2 per Dr. Bridgett Larsson   History of COPD  -Stable, when necessary nebs follow. Continue supplemental oxygen   History of CAD  -She is chest pain free,   Code Status: Full Code Family Communication: Patient only  Disposition Plan: To be determined. PT/OT evals. Was recently discharged from [place].   Consultants:  None   Antibiotics:  Levaquin  Tamiflu   Subjective: Feels terrible. No specific complaints.  Objective: Filed Vitals:   02/03/14 0134 02/03/14 0331 02/03/14 0530 02/03/14 0620  BP:  109/39 116/59   Pulse:  89 89   Temp:  97.7 F (36.5 C) 97.4 F (36.3 C)   TempSrc:  Oral Oral   Resp:  24 26   Height:      Weight:    43.7 kg (96 lb 5.5 oz)  SpO2: 97% 98% 100%     Intake/Output Summary (Last 24 hours) at 02/03/14 1033 Last data filed at 02/03/14 0918  Gross per 24 hour  Intake   1660 ml  Output    203 ml  Net   1457 ml   Filed Weights   02/02/14 0540 02/02/14 0855 02/03/14 0620  Weight: 48.535 kg (107 lb) 44.4 kg (97 lb 14.2 oz) 43.7 kg (96 lb 5.5 oz)    Exam:   General:  AA Ox3  Cardiovascular: RRR  Respiratory: CTA B  Abdomen: S/NT/ND/+BS  Extremities: Right AKA, Left no edema   Neurologic:  Non-focal  Data Reviewed: Basic Metabolic Panel:  Recent Labs Lab  02/01/14 1710 02/02/14 0600  NA 139 140  K 3.7 3.0*  CL 91* 100  CO2 32 22  GLUCOSE 93 100*  BUN 100* 88*  CREATININE 2.44* 2.06*  CALCIUM 9.1 8.2*   Liver Function Tests:  Recent Labs Lab 02/01/14 1710  AST 18  ALT 8  ALKPHOS 81  BILITOT 0.4  PROT 6.0  ALBUMIN 2.3*   No results found for this basename: LIPASE, AMYLASE,  in the last 168 hours No results found for this basename: AMMONIA,  in the last 168 hours CBC:  Recent Labs Lab 02/01/14 1710 02/02/14 0600  WBC 6.7 5.7  NEUTROABS 5.7  --   HGB 8.5* 8.0*  HCT 27.8* 26.5*  MCV 104.1* 105.6*  PLT 163 134*   Cardiac Enzymes:  Recent Labs Lab 02/02/14 0138 02/02/14 0601 02/02/14 1255  TROPONINI <0.30 <0.30 <0.30   BNP (last 3 results)  Recent Labs  02/09/13 1146 02/02/14 0138  PROBNP 222.0* 15475.0*   CBG: No results found for this basename: GLUCAP,  in the last 168 hours  Recent Results (from the past 240 hour(s))  URINE CULTURE     Status: None   Collection Time    02/01/14  8:26 PM      Result Value Ref Range Status   Specimen Description URINE, CLEAN CATCH   Final   Special Requests NONE   Final  Culture  Setup Time     Final   Value: 02/02/2014 01:10     Performed at [facility]     Final   Value: >=100,000 COLONIES/ML     Performed at [facility]   Culture     Final   Value: PROTEUS MIRABILIS     Performed at [facility]   Report Status PENDING   Incomplete  MRSA PCR SCREENING     Status: None   Collection Time    02/02/14 10:16 AM      Result Value Ref Range Status   MRSA by PCR NEGATIVE  NEGATIVE Final   Comment:            The GeneXpert MRSA Assay (FDA     approved for NASAL specimens     only), is one component of a     comprehensive MRSA colonization     surveillance program. It is not     intended to diagnose MRSA     infection nor to guide or     monitor treatment for     MRSA infections.     Studies: Dg Chest Port 1  View  02/01/2014   CLINICAL DATA:  Fever, cough, weakness and shortness of breath.  EXAM: PORTABLE CHEST - 1 VIEW  COMPARISON:  Chest radiograph from 01/11/2014  FINDINGS: The lungs are well-aerated. Vascular congestion is noted, with mildly increased interstitial markings. Mild bibasilar airspace opacities are seen. A small left pleural effusion is suspected. Opacities may reflect atelectasis, though pneumonia cannot be excluded. No pneumothorax is identified.  The cardiomediastinal silhouette is enlarged. No acute osseous abnormalities are seen.  IMPRESSION: Vascular congestion and cardiomegaly, with mildly increased interstitial markings. Mild bibasilar airspace opacities seen; small left pleural effusion suspected. Opacities may reflect atelectasis, though pneumonia cannot be excluded, particularly given the patient's symptoms.   Electronically Signed   By: Garald Balding M.D.   On: 02/01/2014 21:48    Scheduled Meds: . allopurinol  100 mg Oral QODAY  . ALPRAZolam  0.25 mg Oral BID  . antiseptic oral rinse  15 mL Mouth Rinse BID  . calcitRIOL  0.5 mcg Oral QODAY  . enoxaparin (LOVENOX) injection  30 mg Subcutaneous Q24H  . feeding supplement (RESOURCE BREEZE)  1 Container Oral TID BM  . levothyroxine  75 mcg Oral QAC breakfast  . multivitamin with minerals  1 tablet Oral Daily  . oseltamivir  30 mg Oral Daily  . pantoprazole  40 mg Oral Daily  . polyethylene glycol  17 g Oral Daily  . potassium chloride  20 mEq Oral BID  . protein supplement  1 scoop Oral TID WC   Continuous Infusions: . sodium chloride 50 mL/hr at 02/03/14 9604    Principal Problem:   Influenza due to identified novel influenza A virus with other respiratory manifestations Active Problems:   HYPOTHYROIDISM   Atrial fibrillation   CHF   S/P AKA (above knee amputation)   Healthcare-associated pneumonia   UTI (lower urinary tract infection)   Hypotension   CKD (chronic kidney disease)    Time spent: 35 minutes.  Greater than 50% of this time was spent in direct contact with the patient coordinating care.    Lelon Frohlich  [facility] Pager [phone]  If 7PM-7AM, please contact night-coverage at [email or web address], password [facility] 02/03/2014, 10:33 AM  LOS: 2 days

## 2014-02-03 NOTE — Evaluation (Signed)
Occupational Therapy Evaluation Patient Details Name: [name] MRN: [id number] DOB: [birth year] Today's Date: 02/03/2014 Time: 2542-7062 OT Time Calculation (min): 23 min  OT Assessment / Plan / Recommendation History of present illness pt was admitted with cough/fever.  She has h/o R AKA done in 2/15 and recently went to [facility] for rehab.  She was home for 3 days when she experienced these symptoms.  Pt also has COPD, h/o CVA and CHF.   Clinical Impression   Pt was admitted for the above. She will benefit from skilled OT to increase strength and activity tolerance to maximize participation with adls.  Pt just completed rehab at [facility] and needed some assistance for adls at home.      OT Assessment  Patient needs continued OT Services    Follow Up Recommendations  Supervision/Assistance - 24 hour;Home health OT (depending upon progress)    Barriers to Discharge      Equipment Recommendations       Recommendations for Other Services    Frequency  Min 2X/week    Precautions / Restrictions Precautions Precautions: Fall Restrictions Weight Bearing Restrictions: Yes RLE Weight Bearing: Non weight bearing   Pertinent Vitals/Pain No c/o pain but coughing a lot which made her uncomfortable.    ADL  Grooming: Set up (x hair) Where Assessed - Grooming: Supine, head of bed up Upper Body Bathing: Set up Where Assessed - Upper Body Bathing: Supine, head of bed up Lower Body Bathing: Maximal assistance Where Assessed - Lower Body Bathing: Supine, head of bed up;Rolling right and/or left Upper Body Dressing: Minimal assistance Where Assessed - Upper Body Dressing: Supine, head of bed up Lower Body Dressing: +1 Total assistance Where Assessed - Lower Body Dressing: Supine, head of bed up;Rolling right and/or left Transfers/Ambulation Related to ADLs: stood at eob with max A; would need second person for adls, if not done from bed level  Sat EOB but used LUE for support    OT  Diagnosis: Generalized weakness  OT Problem List: Decreased strength;Decreased range of motion;Decreased activity tolerance;Impaired balance (sitting and/or standing);Decreased knowledge of use of DME or AE;Pain;Impaired UE functional use OT Treatment Interventions: Self-care/ADL training;DME and/or AE instruction;Therapeutic activities;Balance training   OT Goals(Current goals can be found in the care plan section) Acute Rehab OT Goals Patient Stated Goal: get to feeling better and get strength back OT Goal Formulation: With patient/family Time For Goal Achievement: 2014-02-27 Potential to Achieve Goals: Good ADL Goals Pt Will Transfer to Toilet: squat pivot transfer;with mod assist;bedside commode Additional ADL Goal #1: pt will sit unsupported eob and perform grooming/UB adls with min guard Additional ADL Goal #2: pt will roll to bil sides with supervision for adls Additional ADL Goal #3: pt will perform 10 reps x 2 sets of AROM wtihin pain-free tolerance to increase strength for adls  Visit Information  Last OT Received On: 02/03/14 Assistance Needed: +1 History of Present Illness: pt was admitted with cough/fever.  She has h/o R AKA done in 2/15 and recently went to [facility] for rehab.  She was home for 3 days when she experienced these symptoms.  Pt also has COPD, h/o CVA and CHF.       Prior [place] expects to be discharged to:: Private residence Living Arrangements: Spouse/significant other Available Help at Discharge: Family;Available 24 hours/day Additional Comments: home for 3 days from CIR prior to onset of flu symptoms Prior Function Level of Independence: Needs assistance Communication Communication:  HOH Dominant Hand: Right         Vision/Perception     Cognition  Cognition Arousal/Alertness: Awake/alert Behavior During Therapy: WFL for tasks assessed/performed Overall Cognitive Status: Within Functional Limits for tasks  assessed    Extremity/Trunk Assessment Upper Extremity Assessment RUE Deficits / Details: able to lift to 80.  has trouble reaching for hair.  Generalized weakness bilaterally LUE Deficits / Details: able to lift 30 degrees only     Mobility Bed Mobility Rolling: Min assist Sidelying to sit: Max assist General bed mobility comments: assist for trunk and legs Transfers Transfers: Sit to/from Stand Sit to Stand: Max assist General transfer comment: therapist supported pt with sit to stand     Exercise     Balance     End of Session OT - End of Session Activity Tolerance: Patient limited by fatigue Patient left: in bed;with call bell/phone within reach;with bed alarm set  [place] 02/03/2014, 2:39 PM Lesle Chris, OTR/L [phone] 02/03/2014

## 2014-02-03 NOTE — Evaluation (Signed)
Physical Therapy Evaluation Patient Details Name: [name] MRN: [id number] DOB: 06-29-1929 Today's Date: 02/03/2014 Time: 1410-1436 PT Time Calculation (min): 26 min  PT Assessment / Plan / Recommendation History of Present Illness  80 yo female admitted with cough/fever.  She has h/o R AKA done in 2/15 and recently went to [facility] for rehab.  She was home for 3 days when she experienced these symptoms.  Pt also has COPD, h/o CVA and CHF.  Clinical Impression  On eval, pt required Max assist for mobility- assisted pt to recliner from bed using squat pivot technique. Demonstrates general weakness and decreased activity tolerance. Daughter present and stated plan was for pt to return home at d/c. Recommend HHPT, 24/7 care. Will follow to practice SB transfers and wheelchair mobility.     PT Assessment  Patient needs continued PT services    Follow Up Recommendations  Home health PT;Supervision/Assistance - 24 hour    Does the patient have the potential to tolerate intense rehabilitation      Barriers to Discharge        Equipment Recommendations  None recommended by PT    Recommendations for Other Services OT consult   Frequency Min 3X/week    Precautions / Restrictions Precautions Precautions: Fall Precaution Comments: R AKA Restrictions Weight Bearing Restrictions: No RLE Weight Bearing: Non weight bearing   Pertinent Vitals/Pain No c/o pain      Mobility  Bed Mobility Overal bed mobility: Needs Assistance Bed Mobility: Supine to Sit Rolling: Min assist Sidelying to sit: Max assist Supine to sit: HOB elevated;Mod assist General bed mobility comments: assist for trunk and leg. Utilized bedpad for scooting, positioning Transfers Transfers: Public house manager Sit to Stand: Max assist Squat pivot transfers: Max assist;From elevated surface General transfer comment: Assist to rise, shift weight anteriorly and stabilize throughout pivot towards L side,  bed>recliner    Exercises     PT Diagnosis: Generalized weakness  PT Problem List: Decreased strength;Decreased activity tolerance;Decreased balance;Decreased mobility PT Treatment Interventions: DME instruction;Functional mobility training;Therapeutic activities;Therapeutic exercise;Wheelchair mobility training;Patient/family education;Balance training     PT Goals(Current goals can be found in the care plan section) Acute Rehab PT Goals Patient Stated Goal: get to feeling better and get strength back PT Goal Formulation: With patient/family Time For Goal Achievement: 11-Mar-2014 Potential to Achieve Goals: Good  Visit Information  Last PT Received On: 02/03/14 Assistance Needed: +1 History of Present Illness: pt was admitted with cough/fever.  She has h/o R AKA done in 2/15 and recently went to [facility] for rehab.  She was home for 3 days when she experienced these symptoms.  Pt also has COPD, h/o CVA and CHF.       Prior [place] expects to be discharged to:: Private residence Living Arrangements: Spouse/significant other Available Help at Discharge: Family;Available 24 hours/day Type of Home: House Home Access: Stairs to enter [employer] of Steps: 5 at front, 10 at rear Entrance Stairs-Rails: Left;Right Home Layout: Able to live on main level with bedroom/bathroom Alternate Level Stairs-Number of Steps: 2 Home Equipment: Walker - 4 wheels;Cane - single point;Bedside commode;Wheelchair - manual Additional Comments: home for 3 days from CIR prior to onset of flu symptoms  Lives With: Spouse Prior Function Level of Independence: Needs assistance Gait / Transfers Assistance Needed: Was performing SB transfers with Min assist when pt left CIR Communication Communication: HOH Dominant Hand: Right    Cognition  Cognition Arousal/Alertness: Awake/alert Behavior During Therapy: [facility] for tasks  assessed/performed Overall Cognitive Status:  Within Functional Limits for tasks assessed    Extremity/Trunk Assessment Upper Extremity Assessment Upper Extremity Assessment: Defer to OT evaluation RUE Deficits / Details: able to lift to 80.  has trouble reaching for hair.  Generalized weakness bilaterally LUE Deficits / Details: able to lift 30 degrees only Lower Extremity Assessment RLE Deficits / Details: subacute AKA; hip flex at least 3/5, knee ext at least 3/5 LLE Deficits / Details: AROM hip WFL, Knee to 100 flexion, ankle WFL; strength 3+   Balance    End of Session PT - End of Session Activity Tolerance: Patient limited by fatigue Patient left: in chair;with call bell/phone within reach;with family/visitor present Nurse Communication: Mobility status;Other (comment) (daughter present and states she was a CNA and could transfer pt back to bed-recommended nursing be in room with her. Made RN aware)  GP     Weston Anna, MPT Pager: [phone]

## 2014-02-03 NOTE — Care Management Note (Addendum)
    Page 1 of 2   02/05/2014     2:55:58 PM   CARE MANAGEMENT NOTE 02/05/2014  Patient:  CALLA, WEDEKIND   Account Number:  [id number]  Date Initiated:  02/02/2014  Documentation initiated by:  DAVIS,RHONDA  Subjective/Objective Assessment:   pt with recent hx of tkr now presents with po0ss sepsis versus early pna/bnp evelated, low grade temp,     Action/Plan:   from home lives with her spouse,has been using [facility] after knee surgery.   Anticipated DC Date:  02/05/2014   Anticipated DC Plan:  [place]  In-house referral  NA      DC Planning Services  CM consult      [facility] Choice  Resumption Of Svcs/PTA Provider   Choice offered to / List presented to:  C-4 Adult Children   DME arranged  NA      DME agency  [place] arranged  [place] RN      [place].   Status of service:  Completed, signed off Medicare Important Message given?  NA - LOS <3 / Initial given by admissions (If response is "NO", the following Medicare IM given date fields will be blank) Date Medicare IM given:   Date Additional Medicare IM given:  02/05/2014  Discharge Disposition:  [place]  Per UR Regulation:  Reviewed for med. necessity/level of care/duration of stay  If discussed at Long Length of Stay Meetings, dates discussed:    Comments:  02/05/14 Alece Koppel RN,BSN NCM [address] HHRN/PT/OT,ALREADY USES HOME 02 3LNC.FOR D/C HOME VIS AMBULANCE TRANSP.  NOTED PER SECY PATIENT'S DTR IS COMING TO HOSPITAL TO BRING CLOTHES FOR PATIENT TO D/C HOME VIA AMBULANCE TRANSP. PER MD-MEDICALLY STABLE FOR D/C.MEDICARE IM VERBALLY GIVEN TO DTR-[name] TEL#[phone] OVER PHONE, [place] @ [address], NOTHING BUT AN [address].COPY OF MEDICARE IM LEFT IN RM.DTR VOICED UNDERSTANDING.[facility] HHPT/OT [place] [facility] REP KRISTEN AWARE OF HHC  ORDERS,& ALSO OF APPEAL INFO GIVEN TO DTR SINCE PATIENT TOO FEEBLE.MD/NURSE UPDATED.DTR STATES PATIENT WOULD NEED AMBULANCE TRANSP @ D/C.  02/03/14 Sanjay Broadfoot RN,BSN NCM [phone] TRANSFER FROM SDU.[place] ALREADY ACTIVE,RECEIVES HOME 02-[facility].IF HHC NEEDED PLEASE PUT IN ORDERS.  [place], RN, [place], [place] [phone] Chart Reviewed for discharge and hospital needs. Discharge needs at time of review:  None present will follow for needs. Review of patient progress due on [id number].

## 2014-02-03 NOTE — Progress Notes (Signed)
EKG performed this am and shows patient in Afib with right bundle branch block with a rate of 91.  Will  continue to monitor patient.

## 2014-02-03 NOTE — Progress Notes (Signed)
ANTIBIOTIC CONSULT NOTE - INITIAL  Pharmacy Consult for vancomycin, cefepime--->Levaquin Indication: pneumonia  Allergies  Allergen Reactions  . Adhesive [Tape]   . Aspirin Other (See Comments)    Tendency to bleed easily    Patient Measurements: Height: 4\' 10"  (147.3 cm) Weight: 96 lb 5.5 oz (43.7 kg) IBW/kg (Calculated) : 40.9 Adjusted Body Weight:   Vital Signs: Temp: 97.4 F (36.3 C) (02/25 0530) Temp src: Oral (02/25 0530) BP: 116/59 mmHg (02/25 0530) Pulse Rate: 89 (02/25 0530) Intake/Output from previous day: 02/24 0701 - 02/25 0700 In: 1700 [P.O.:500; I.V.:1100; IV Piggyback:100] Out: 203 [Urine:200; Stool:3] Intake/Output from this shift: Total I/O In: 60 [P.O.:60] Out: -   Labs:  Recent Labs  02/01/14 1710 02/02/14 0600  WBC 6.7 5.7  HGB 8.5* 8.0*  PLT 163 134*  CREATININE 2.44* 2.06*   Estimated Creatinine Clearance: 13.6 ml/min (by C-G formula based on Cr of 2.06). No results found for this basename: VANCOTROUGH, Corlis Leak, VANCORANDOM, [place], GENTPEAK, GENTRANDOM, TOBRATROUGH, TOBRAPEAK, TOBRARND, AMIKACINPEAK, AMIKACINTROU, AMIKACIN,  in the last 72 hours   Microbiology: Recent Results (from the past 720 hour(s))  MRSA PCR SCREENING     Status: None   Collection Time    01/11/14  7:52 PM      Result Value Ref Range Status   MRSA by PCR NEGATIVE  NEGATIVE Final   Comment:            The GeneXpert MRSA Assay (FDA     approved for NASAL specimens     only), is one component of a     comprehensive MRSA colonization     surveillance program. It is not     intended to diagnose MRSA     infection nor to guide or     monitor treatment for     MRSA infections.  CULTURE, BLOOD (ROUTINE X 2)     Status: None   Collection Time    02/01/14  7:50 PM      Result Value Ref Range Status   Specimen Description BLOOD RIGHT ARM   Final   Special Requests BOTTLES DRAWN AEROBIC AND ANAEROBIC 5ML   Final   Culture  Setup Time     Final   Value:  02/02/2014 00:57     Performed at [facility]   Culture     Final   Value:        BLOOD CULTURE RECEIVED NO GROWTH TO DATE CULTURE WILL BE HELD FOR 5 DAYS BEFORE ISSUING A FINAL NEGATIVE REPORT     Performed at [facility]   Report Status PENDING   Incomplete  CULTURE, BLOOD (ROUTINE X 2)     Status: None   Collection Time    02/01/14  8:10 PM      Result Value Ref Range Status   Specimen Description BLOOD RIGHT FOREARM   Final   Special Requests BOTTLES DRAWN AEROBIC ONLY 2ML   Final   Culture  Setup Time     Final   Value: 02/02/2014 00:57     Performed at [facility]   Culture     Final   Value:        BLOOD CULTURE RECEIVED NO GROWTH TO DATE CULTURE WILL BE HELD FOR 5 DAYS BEFORE ISSUING A FINAL NEGATIVE REPORT     Performed at [facility]   Report Status PENDING   Incomplete  URINE CULTURE     Status: None   Collection Time  02/01/14  8:26 PM      Result Value Ref Range Status   Specimen Description URINE, CLEAN CATCH   Final   Special Requests NONE   Final   Culture  Setup Time     Final   Value: 02/02/2014 01:10     Performed at [facility]     Final   Value: >=100,000 COLONIES/ML     Performed at [facility]   Culture     Final   Value: PROTEUS MIRABILIS     Performed at [facility]   Report Status PENDING   Incomplete  MRSA PCR SCREENING     Status: None   Collection Time    02/02/14 10:16 AM      Result Value Ref Range Status   MRSA by PCR NEGATIVE  NEGATIVE Final   Comment:            The GeneXpert MRSA Assay (FDA     approved for NASAL specimens     only), is one component of a     comprehensive MRSA colonization     surveillance program. It is not     intended to diagnose MRSA     infection nor to guide or     monitor treatment for     MRSA infections.  CLOSTRIDIUM DIFFICILE BY PCR     Status: None   Collection Time    02/02/14  7:09 PM      Result Value Ref Range Status    C difficile by pcr NEGATIVE  NEGATIVE Final   Comment: Performed at [place] History: Past Medical History  Diagnosis Date  . Restrictive cardiomyopathy   . Atrial fibrillation 1996    S/P cardioversion  . CVA (cerebral infarction) 1999  . CPAP (continuous positive airway pressure) dependence   . Anemia     NOS  . Iron deficiency   . Gout   . Peptic ulcer, acute with hemorrhage 2005, 2011  . Hypertension   . Hypothyroidism   . Diastolic dysfunction   . Adenomatous colon polyp   . GERD (gastroesophageal reflux disease)   . Hiatal hernia   . Diverticulosis   . Anxiety disorder   . Arthritis   . Internal and external hemorrhoids without complication   . Morbid obesity   . CAD (coronary artery disease) 08/06/2008    One vessel by cath, EF overall preserved by echo 04/30/2008  . On home oxygen therapy   . Renal failure     Dr. Hassell Done  . Hyperlipidemia   . CHF (congestive heart failure)   . Complication of anesthesia     pt states she was sore "all-over" for over a week after anesthesia  . COAD (chronic obstructive airways disease) 2007    Exacerbation  . COPD (chronic obstructive pulmonary disease)     oxygen prn  . Sleep apnea     cpap - no longer uses  . Stroke     x2 -- effected vision; left sided slightly weak  . Rash     on upper extremities  . Bowel trouble     having constipation and diarrhea  . Family history of anesthesia complication     daughter had same "sore all over" experience with anesthesia  . AVM (arteriovenous malformation) of small bowel, acquired     2012 capsule endoscopy    Medications:  Anti-infectives   Start  Dose/Rate Route Frequency Ordered Stop   02/05/14 1400  levofloxacin (LEVAQUIN) tablet 500 mg     500 mg Oral Every 48 hours 02/03/14 1206     02/04/14 0600  vancomycin (VANCOCIN) 500 mg in sodium chloride 0.9 % 100 mL IVPB  Status:  Discontinued     500 mg 100 mL/hr over 60 Minutes Intravenous Every 48  hours 02/02/14 0544 02/03/14 1018   02/03/14 1400  levofloxacin (LEVAQUIN) IVPB 750 mg     750 mg 100 mL/hr over 90 Minutes Intravenous  Once 02/03/14 1206     02/02/14 2200  ceFEPIme (MAXIPIME) 1 g in dextrose 5 % 50 mL IVPB  Status:  Discontinued     1 g 100 mL/hr over 30 Minutes Intravenous Every 24 hours 02/02/14 0544 02/03/14 1018   02/02/14 2200  oseltamivir (TAMIFLU) capsule 30 mg     30 mg Oral Daily 02/02/14 2131 02/07/14 0959   02/02/14 0115  ceFEPIme (MAXIPIME) 1 g in dextrose 5 % 50 mL IVPB  Status:  Discontinued     1 g 100 mL/hr over 30 Minutes Intravenous 3 times per day 02/02/14 0104 02/02/14 0543   02/01/14 2300  vancomycin (VANCOCIN) 1,000 mg in sodium chloride 0.9 % 250 mL IVPB     1,000 mg 250 mL/hr over 60 Minutes Intravenous  Once 02/01/14 2246 02/02/14 0129   02/01/14 2245  ceFEPIme (MAXIPIME) 1 g in dextrose 5 % 50 mL IVPB     1 g 100 mL/hr over 30 Minutes Intravenous  Once 02/01/14 2238 02/01/14 2341   02/01/14 2130  cefTRIAXone (ROCEPHIN) 1 g in dextrose 5 % 50 mL IVPB     1 g 100 mL/hr over 30 Minutes Intravenous  Once 02/01/14 2122 02/01/14 2156     Assessment: 80yo F with cough, SOB, fever, hypotensive, purulent urine. Mult med problems including A.fib(not candidate for anticoag), recent CVA and AKA. DC'd 5 days prior to admission from [facility].  Pt initially received broad spectrum antibiotics for HCAP and proteus UTI, now to transition to levaquin.  Also on 5 day course tamiflu for influenza.   D#3 antibiotics 2/23 >> Rocephin x 1 2/24 >> vanc >> 2/25 2/24 >> cefepime >>2/25 2/24 >> oseltamivir >> (5 days) 2/25 >> levaquin >>   Tmax: AF WBCs: wnl Renal: CKD (baseline SCr appears 2.5-3) - SCr 2.06, CrCl 14   2/23 blood x2: NGTD 2/23 urine: >100K proteus, sensitivities pending 2/24 MRSA PCR: neg 2/23 flu PCR: POSITIVE   Goal of Therapy:  Eradication of infection  Plan:  Levaquin 750mg  IV x 1 now, then 500mg  PO q 48 hours to complete 10-14 days  therapy.    Ralene Bathe, PharmD, BCPS 02/03/2014, 12:07 PM  Pager: [phone]

## 2014-02-03 NOTE — Progress Notes (Signed)
Patient appeared to go into a junctional rhythm around 23:04 last night.  She also began to brady down to the hight 30's, 37-39, around 03:08 am but did not sustain.  Vitals stable and patient was asleep.  No new complaints; will continue to monitor pt.

## 2014-02-04 DIAGNOSIS — J09X2 Influenza due to identified novel influenza A virus with other respiratory manifestations: Secondary | ICD-10-CM

## 2014-02-04 LAB — CBC
HCT: 27 % — ABNORMAL LOW (ref 36.0–46.0)
Hemoglobin: 8.3 g/dL — ABNORMAL LOW (ref 12.0–15.0)
MCH: 32.3 pg (ref 26.0–34.0)
MCHC: 30.7 g/dL (ref 30.0–36.0)
MCV: 105.1 fL — ABNORMAL HIGH (ref 78.0–100.0)
Platelets: 112 10*3/uL — ABNORMAL LOW (ref 150–400)
RBC: 2.57 MIL/uL — ABNORMAL LOW (ref 3.87–5.11)
RDW: 17.1 % — AB (ref 11.5–15.5)
WBC: 6.7 10*3/uL (ref 4.0–10.5)

## 2014-02-04 LAB — URINE CULTURE: Colony Count: >=100000

## 2014-02-04 LAB — BASIC METABOLIC PANEL
BUN: 86 mg/dL — AB (ref 6–23)
CO2: 21 mEq/L (ref 19–32)
CREATININE: 1.94 mg/dL — AB (ref 0.50–1.10)
Calcium: 8.6 mg/dL (ref 8.4–10.5)
Chloride: 106 mEq/L (ref 96–112)
GFR, EST AFRICAN AMERICAN: 27 mL/min — AB (ref >90)
GFR, EST NON AFRICAN AMERICAN: 23 mL/min — AB (ref >90)
GLUCOSE: 101 mg/dL — AB (ref 70–99)
Potassium: 3.1 mEq/L — ABNORMAL LOW (ref 3.7–5.3)
Sodium: 144 mEq/L (ref 137–147)

## 2014-02-04 NOTE — Progress Notes (Signed)
Occupational Therapy Treatment Patient Details Name: [name] MRN: [id number] DOB: [birth year] Today's Date: 02/04/2014 Time: 8546-2703 OT Time Calculation (min): 31 min  OT Assessment / Plan / Recommendation  History of present illness pt was admitted with cough/fever.  She has h/o R AKA done in 2/15 and recently went to [facility] for rehab.  She was home for 3 days when she experienced these symptoms.  Pt also has COPD, h/o CVA and CHF.   OT comments  Pt worked EOB on reaching, sit to stand and scooting up EOB to increase strength and activity tolerance as needed for BADLs.  Pt with dyspnea 3/4 02 sats 100% on 2L.   Currently, she requires max - total A with BADLs  Follow Up Recommendations  Home health OT;Supervision/Assistance - 24 hour    Barriers to Discharge       Equipment Recommendations       Recommendations for Other Services    Frequency Min 2X/week   Progress towards OT Goals    Plan Discharge plan remains appropriate    Precautions / Restrictions Precautions Precautions: Fall Precaution Comments: R AKA Restrictions Weight Bearing Restrictions: No   Pertinent Vitals/Pain     ADL  Lower Body Bathing: Maximal assistance Where Assessed - Lower Body Bathing: Supine, head of bed up;Rolling right and/or left Lower Body Dressing: +1 Total assistance Where Assessed - Lower Body Dressing: Supine, head of bed up;Rolling right and/or left Transfers/Ambulation Related to ADLs: Partial stand EOB with max A ADL Comments: Pt worked on EOB sitting x 18 mins.  Pt performed reaching with each UE x 8.  Unable to access foot.  Worked on sit to stand x 2, and scooted up EOB with max A    OT Diagnosis:    OT Problem List:   OT Treatment Interventions:     OT Goals(current goals can now be found in the care plan section) ADL Goals Pt Will Perform Grooming: with min assist;sitting Pt Will Transfer to Toilet: squat pivot transfer;with mod assist;bedside commode Additional ADL  Goal #1: pt will sit unsupported eob and perform grooming/UB adls with min guard Additional ADL Goal #2: pt will roll to bil sides with supervision for adls Additional ADL Goal #3: pt will perform 10 reps x 2 sets of AROM wtihin pain-free tolerance to increase strength for adls  Visit Information  Last OT Received On: 02/04/14 Assistance Needed: +1 History of Present Illness: pt was admitted with cough/fever.  She has h/o R AKA done in 2/15 and recently went to [facility] for rehab.  She was home for 3 days when she experienced these symptoms.  Pt also has COPD, h/o CVA and CHF.    Subjective Data      Prior Functioning       Cognition  Cognition Arousal/Alertness: Awake/alert Behavior During Therapy: WFL for tasks assessed/performed Overall Cognitive Status: Within Functional Limits for tasks assessed    Mobility  Bed Mobility Overal bed mobility: Needs Assistance Bed Mobility: Supine to Sit;Sit to Supine Supine to sit: Min assist;HOB elevated Sit to supine: Mod assist General bed mobility comments: assist to scoot hips and to lift trunk.  Assist for LEs to move back into bed Transfers Overall transfer level: Needs assistance Equipment used: None Transfers: Sit to/from Stand Sit to Stand: Max assist General transfer comment: Pt unable to achieve full stand due to fatigue    Exercises      Balance Balance Overall balance assessment: Needs assistance Sitting-balance support: Bilateral upper  extremity supported Sitting balance-Leahy Scale: Fair Standing balance support: Bilateral upper extremity supported Standing balance-Leahy Scale: Zero Standing balance comment: unable to achieve full standing   End of Session OT - End of Session Equipment Utilized During Treatment: Oxygen Activity Tolerance: Patient limited by fatigue Patient left: in bed;with call bell/phone within reach Nurse Communication: Other (comment) (Pt with small BM - nsg notified)  [place], Bandon Sherwin  M 02/04/2014, 2:49 PM

## 2014-02-04 NOTE — Progress Notes (Addendum)
   [name] is a 80 y.o. female followed by Dr. Dorris Carnes with AFib, CHF in setting of restrictive CM.  Her daughter called the answering service b/c [name] is admitted to [facility] with pneumonia/influenza.  She wanted to make our service aware.  I explained to her daughter that we are not actively seeing her but I would let our team know.  The IM service may contact us as needed. Richardson Dopp, PA-C   02/04/2014 11:30 AM

## 2014-02-04 NOTE — Progress Notes (Signed)
Physical Therapy Treatment Patient Details Name: [name] MRN: [id number] DOB: 06/30/1929 Today's Date: 02/04/2014 Time: 0623-7628 PT Time Calculation (min): 24 min  PT Assessment / Plan / Recommendation  History of Present Illness pt was admitted with cough/fever.  She has h/o R AKA done in 2/15 and recently went to [facility] for rehab.  She was home for 3 days when she experienced these symptoms.  Pt also has COPD, h/o CVA and CHF.   PT Comments   Progressing slowly with mobility. Pt declined OOB this session (worked with OT). Working on sitting balance EOB and exercises.   Follow Up Recommendations  Home health PT;Supervision/Assistance - 24 hour     Does the patient have the potential to tolerate intense rehabilitation     Barriers to Discharge        Equipment Recommendations  None recommended by PT    Recommendations for Other Services OT consult  Frequency Min 3X/week   Progress towards PT Goals Progress towards PT goals: Progressing toward goals  Plan Current plan remains appropriate    Precautions / Restrictions Precautions Precautions: Fall Precaution Comments: R AKA Restrictions Weight Bearing Restrictions: No   Pertinent Vitals/Pain No c/o pain    Mobility  Bed Mobility Overal bed mobility: Needs Assistance Bed Mobility: Sit to Supine;Supine to Sit Rolling: Supervision Supine to sit: HOB elevated;Mod assist Sit to supine: Mod assist General bed mobility comments: assist to scoot hips and to lift trunk.  Assist for LEs to move back into bed Transfers Overall transfer level: Needs assistance Equipment used: None Transfers: Sit to/from Stand Sit to Stand: Max assist General transfer comment: Pt unable to achieve full stand due to fatigue    Exercises General Exercises - Lower Extremity Ankle Circles/Pumps: AROM;Left;10 reps;Seated Long Arc Quad: AROM;Left;10 reps;Seated Hip Flexion/Marching: AROM;Left;10 reps;Seated Amputee Exercises Hip  ABduction/ADduction: AROM;Right;10 reps;Supine Hip Flexion/Marching: Right;10 reps;Supine;AROM   PT Diagnosis:    PT Problem List:   PT Treatment Interventions:     PT Goals (current goals can now be found in the care plan section)    Visit Information  Last PT Received On: 02/04/14 Assistance Needed: +1 History of Present Illness: pt was admitted with cough/fever.  She has h/o R AKA done in 2/15 and recently went to [facility] for rehab.  She was home for 3 days when she experienced these symptoms.  Pt also has COPD, h/o CVA and CHF.    Subjective Data      Cognition  Cognition Arousal/Alertness: Awake/alert Behavior During Therapy: WFL for tasks assessed/performed Overall Cognitive Status: Within Functional Limits for tasks assessed    Balance  Balance Overall balance assessment: Needs assistance Sitting-balance support: Bilateral upper extremity supported Sitting balance-Leahy Scale: Fair Standing balance support: Bilateral upper extremity supported Standing balance-Leahy Scale: Zero Standing balance comment: unable to achieve full standing   End of Session PT - End of Session Equipment Utilized During Treatment: Oxygen Activity Tolerance: Patient limited by fatigue Patient left: in bed;with call bell/phone within reach   GP     Weston Anna, MPT Pager: [phone]

## 2014-02-04 NOTE — Progress Notes (Addendum)
[facility] PROGRESS NOTE  [name] ACZ:[id number] DOB: September 15, 1929 DOA: 02/01/2014 PCP: Unice Cobble, MD  Assessment/Plan: Flu/?HCAP -Continue tamiflu for 5 days. -Not convinced she has true bacterial PNA. -Will de-escalate antibiotics to levaquin.  UTI -Cx with proteus sensitive to levaquin.  CKD (chronic kidney disease) Stage IV -Creatinine stable, follow.   HYPOTHYROIDISM  -Continue Synthroid   Atrial fibrillation  -Rate controlled, on no meds-follow   History of diastolic CHF  -Appears compensated at this time. -Holding diuretics secondary to hypotension as above follow and resume when clinically appropriate   S/P right AKA (above knee amputation)-on 2/2 per Dr. Bridgett Larsson   History of COPD  -Stable, when necessary nebs follow. Continue supplemental oxygen   History of CAD  -She is chest pain free,   Code Status: Full Code Family Communication: Patient only  Disposition Plan: Home with [facility] services; anticipate in 1-2 days.   Consultants:  None   Antibiotics:  Levaquin  Tamiflu   Subjective: Feels terrible. No specific complaints.  Objective: Filed Vitals:   02/03/14 2038 02/04/14 0431 02/04/14 0551 02/04/14 0624  BP:   114/54   Pulse:   105   Temp:   97.5 F (36.4 C)   TempSrc:   Oral   Resp:   24   Height:      Weight:    43.8 kg (96 lb 9 oz)  SpO2: 100% 98% 100%     Intake/Output Summary (Last 24 hours) at 02/04/14 1133 Last data filed at 02/04/14 0854  Gross per 24 hour  Intake   1215 ml  Output    300 ml  Net    915 ml   Filed Weights   02/03/14 0620 02/03/14 1028 02/04/14 0624  Weight: 43.7 kg (96 lb 5.5 oz) 43.7 kg (96 lb 5.5 oz) 43.8 kg (96 lb 9 oz)    Exam:   General:  AA Ox3  Cardiovascular: RRR  Respiratory: CTA B  Abdomen: S/NT/ND/+BS  Extremities: Right AKA, Left no edema   Neurologic:  Non-focal  Data Reviewed: Basic Metabolic Panel:  Recent Labs Lab 02/01/14 1710 02/02/14 0600  02/04/14 0455  NA 139 140 144  K 3.7 3.0* 3.1*  CL 91* 100 106  CO2 32 22 21  GLUCOSE 93 100* 101*  BUN 100* 88* 86*  CREATININE 2.44* 2.06* 1.94*  CALCIUM 9.1 8.2* 8.6   Liver Function Tests:  Recent Labs Lab 02/01/14 1710  AST 18  ALT 8  ALKPHOS 81  BILITOT 0.4  PROT 6.0  ALBUMIN 2.3*   No results found for this basename: LIPASE, AMYLASE,  in the last 168 hours No results found for this basename: AMMONIA,  in the last 168 hours CBC:  Recent Labs Lab 02/01/14 1710 02/02/14 0600 02/04/14 0455  WBC 6.7 5.7 6.7  NEUTROABS 5.7  --   --   HGB 8.5* 8.0* 8.3*  HCT 27.8* 26.5* 27.0*  MCV 104.1* 105.6* 105.1*  PLT 163 134* 112*   Cardiac Enzymes:  Recent Labs Lab 02/02/14 0138 02/02/14 0601 02/02/14 1255  TROPONINI <0.30 <0.30 <0.30   BNP (last 3 results)  Recent Labs  02/09/13 1146 02/02/14 0138  PROBNP 222.0* 15475.0*   CBG: No results found for this basename: GLUCAP,  in the last 168 hours  Recent Results (from the past 240 hour(s))  CULTURE, BLOOD (ROUTINE X 2)     Status: None   Collection Time    02/01/14  7:50 PM      Result  Value Ref Range Status   Specimen Description BLOOD RIGHT ARM   Final   Special Requests BOTTLES DRAWN AEROBIC AND ANAEROBIC 5ML   Final   Culture  Setup Time     Final   Value: 02/02/2014 00:57     Performed at [facility]   Culture     Final   Value:        BLOOD CULTURE RECEIVED NO GROWTH TO DATE CULTURE WILL BE HELD FOR 5 DAYS BEFORE ISSUING A FINAL NEGATIVE REPORT     Performed at [facility]   Report Status PENDING   Incomplete  CULTURE, BLOOD (ROUTINE X 2)     Status: None   Collection Time    02/01/14  8:10 PM      Result Value Ref Range Status   Specimen Description BLOOD RIGHT FOREARM   Final   Special Requests BOTTLES DRAWN AEROBIC ONLY 2ML   Final   Culture  Setup Time     Final   Value: 02/02/2014 00:57     Performed at [facility]   Culture     Final   Value:        BLOOD  CULTURE RECEIVED NO GROWTH TO DATE CULTURE WILL BE HELD FOR 5 DAYS BEFORE ISSUING A FINAL NEGATIVE REPORT     Performed at [facility]   Report Status PENDING   Incomplete  URINE CULTURE     Status: None   Collection Time    02/01/14  8:26 PM      Result Value Ref Range Status   Specimen Description URINE, CLEAN CATCH   Final   Special Requests NONE   Final   Culture  Setup Time     Final   Value: 02/02/2014 01:10     Performed at [facility]     Final   Value: >=100,000 COLONIES/ML     Performed at [facility]   Culture     Final   Value: PROTEUS MIRABILIS     Performed at [facility]   Report Status 02/04/2014 FINAL   Final   Organism ID, Bacteria PROTEUS MIRABILIS   Final  MRSA PCR SCREENING     Status: None   Collection Time    02/02/14 10:16 AM      Result Value Ref Range Status   MRSA by PCR NEGATIVE  NEGATIVE Final   Comment:            The GeneXpert MRSA Assay (FDA     approved for NASAL specimens     only), is one component of a     comprehensive MRSA colonization     surveillance program. It is not     intended to diagnose MRSA     infection nor to guide or     monitor treatment for     MRSA infections.  CLOSTRIDIUM DIFFICILE BY PCR     Status: None   Collection Time    02/02/14  7:09 PM      Result Value Ref Range Status   C difficile by pcr NEGATIVE  NEGATIVE Final   Comment: Performed at [place], EXPECTORATED SPUTUM-ASSESSMENT     Status: None   Collection Time    02/03/14 11:34 AM      Result Value Ref Range Status   Specimen Description SPUTUM   Final   Special Requests NONE   Final  Sputum evaluation     Final   Value: THIS SPECIMEN IS ACCEPTABLE. RESPIRATORY CULTURE REPORT TO FOLLOW.   Report Status 02/03/2014 FINAL   Final  CULTURE, RESPIRATORY (NON-EXPECTORATED)     Status: None   Collection Time    02/03/14 11:34 AM      Result Value Ref Range Status   Specimen  Description SPUTUM   Final   Special Requests NONE   Final   Gram Stain     Final   Value: NO WBC SEEN     NO SQUAMOUS EPITHELIAL CELLS SEEN     NO ORGANISMS SEEN     Performed at [facility]   Culture     Final   Value: NORMAL OROPHARYNGEAL FLORA     Performed at [facility]   Report Status PENDING   Incomplete     Studies: No results found.  Scheduled Meds: . allopurinol  100 mg Oral QODAY  . ALPRAZolam  0.25 mg Oral BID  . antiseptic oral rinse  15 mL Mouth Rinse BID  . calcitRIOL  0.5 mcg Oral QODAY  . enoxaparin (LOVENOX) injection  30 mg Subcutaneous Q24H  . feeding supplement (RESOURCE BREEZE)  1 Container Oral TID BM  . [START ON 02/05/2014] levofloxacin  500 mg Oral Q48H  . levothyroxine  75 mcg Oral QAC breakfast  . multivitamin with minerals  1 tablet Oral Daily  . oseltamivir  30 mg Oral Daily  . pantoprazole  40 mg Oral Daily  . polyethylene glycol  17 g Oral Daily  . potassium chloride  20 mEq Oral BID  . protein supplement  1 scoop Oral TID WC   Continuous Infusions: . sodium chloride 50 mL/hr at 02/03/14 [id number]    Principal Problem:   Influenza due to identified novel influenza A virus with other respiratory manifestations Active Problems:   HYPOTHYROIDISM   Atrial fibrillation   CHF   S/P AKA (above knee amputation)   Healthcare-associated pneumonia   UTI (lower urinary tract infection)   Hypotension   CKD (chronic kidney disease)    Time spent: 25 minutes. Greater than 50% of this time was spent in direct contact with the patient coordinating care.    Lelon Frohlich  [facility] Pager [phone]  If 7PM-7AM, please contact night-coverage at [email or web address], password [facility] 02/04/2014, 11:33 AM  LOS: 3 days

## 2014-02-05 LAB — CULTURE, RESPIRATORY
CULTURE: NORMAL
GRAM STAIN: NONE SEEN

## 2014-02-05 LAB — CULTURE, RESPIRATORY W GRAM STAIN

## 2014-02-05 MED ORDER — LEVOFLOXACIN 500 MG PO TABS: 500.0000 mg | ORAL_TABLET | ORAL | Status: DC

## 2014-02-05 MED ORDER — IPRATROPIUM-ALBUTEROL 0.5-2.5 (3) MG/3ML IN SOLN
3.0000 mL | Freq: Four times a day (QID) | RESPIRATORY_TRACT | Status: DC
  Administered 2014-02-05 (×2): 3 mL via RESPIRATORY_TRACT
  Filled 2014-02-05 (×2): qty 3

## 2014-02-05 MED ORDER — EPOETIN ALFA 20000 UNIT/ML IJ SOLN
40000.0000 [IU] | Freq: Once | INTRAMUSCULAR | Status: AC
  Administered 2014-02-05: 40000 [IU] via SUBCUTANEOUS
  Filled 2014-02-05: qty 2

## 2014-02-05 MED ORDER — ALBUTEROL SULFATE (2.5 MG/3ML) 0.083% IN NEBU: 2.5000 mg | INHALATION_SOLUTION | RESPIRATORY_TRACT | Status: DC | PRN

## 2014-02-05 MED ORDER — OSELTAMIVIR PHOSPHATE 30 MG PO CAPS: 30.0000 mg | ORAL_CAPSULE | Freq: Every day | ORAL | Status: DC

## 2014-02-05 MED ORDER — EPOETIN ALFA 40000 UNIT/ML IJ SOLN
40000.0000 [IU] | Freq: Once | INTRAMUSCULAR | Status: DC
  Filled 2014-02-05: qty 1

## 2014-02-05 NOTE — Discharge Summary (Signed)
Physician Discharge Summary  [name] ZDG:[id number] DOB: 22-Mar-1929 DOA: 02/01/2014  PCP: Unice Cobble, MD  Admit date: 02/01/2014 Discharge date: 02/05/2014  Time spent: 45 minutes  Recommendations for Outpatient Follow-up:  -Will be discharged home today. -Advised to follow up with PCP in 2 weeks. -2 days of tamiflu and 6 days of levaquin remaining on DC.   Discharge Diagnoses:  Principal Problem:   Influenza due to identified novel influenza A virus with other respiratory manifestations Active Problems:   HYPOTHYROIDISM   Atrial fibrillation   CHF   S/P AKA (above knee amputation)   Healthcare-associated pneumonia   UTI (lower urinary tract infection)   Hypotension   Chronic kidney disease (CKD), stage IV (severe)   Discharge Condition: Stable and improved  Filed Weights   02/03/14 1028 02/04/14 0624 02/05/14 0600  Weight: 43.7 kg (96 lb 5.5 oz) 43.8 kg (96 lb 9 oz) 45 kg (99 lb 3.3 oz)    History of present illness:  [name] is a 80 y.o. female with multiple medical problems including hypertension, diastolic CHF, COPD, atrial fibrillation CKD followed by Dr. Hassell Done, CVA last hospitalized earlier in February and status post right AKA on 2/2 and was in CIR till she was discharged about 5 days ago and presents with above complaints. She states that yesterday she developed cough, with subjective fevers and today became short of breath and so was brought to the ED. She denies chest pain, leg swelling and no PND reported. Per EDP had temp of 100.6, chest x-ray showed vascular congestion is mildly increased interstitial markings and mild bibasilar airspace opacities, and she was desatting in the 80s, lactic acid level I.4. Urinalysis is consistent with a UTI and she was hypotensive with systolic in the 95J. She was started on IV fluids and was admitted for further evaluation and management. Hospitalist admission was requested.   Hospital Course:   Flu/?HCAP   -Continue tamiflu for 5 days. 2 days remaining on DC. -Not convinced she has true bacterial PNA.  -Will de-escalate antibiotics to levaquin of which 6 days are remaining on DC.  UTI  -Cx with proteus sensitive to levaquin.   CKD (chronic kidney disease) Stage IV  -Creatinine stable, follow.   HYPOTHYROIDISM  -Continue Synthroid   Atrial fibrillation  -Rate controlled, on no meds-follow   History of diastolic CHF  -Appears compensated at this time.  -Holding diuretics secondary to hypotension as above follow and resume when clinically appropriate   S/P right AKA (above knee amputation)-on 2/2 per Dr. Bridgett Larsson. Has follow up next week for stable removal.  History of COPD  -Stable, when necessary nebs follow. Continue supplemental oxygen   History of CAD  -She is chest pain free,      Procedures:  None   Consultations:  None  Discharge Instructions  Discharge Orders   Future Appointments Provider Department Dept Phone   02/12/2014 2:15 PM Conrad Milton, MD [facility] [phone]   03/24/2014 11:00 AM Meredith Staggers, MD [facility] [phone]   Future Orders Complete By Expires   Diet - low sodium heart healthy  As directed    Discontinue IV  As directed    Increase activity slowly  As directed        Medication List         acetaminophen 325 MG tablet  Commonly known as:  TYLENOL  Take 650 mg by mouth every 4 (four) hours as  needed. For pain.     allopurinol 100 MG tablet  Commonly known as:  ZYLOPRIM  Take 1 tablet (100 mg total) by mouth every other day.     ALPRAZolam 0.25 MG tablet  Commonly known as:  XANAX  Take 1 tablet (0.25 mg total) by mouth 2 (two) times daily.     calcitRIOL 0.5 MCG capsule  Commonly known as:  ROCALTROL  Take 1 capsule (0.5 mcg total) by mouth every other day.     HYDROcodone-acetaminophen 5-325 MG per tablet  Commonly known as:  NORCO/VICODIN  Take  1-2 tablets by mouth every 6 (six) hours as needed for moderate pain.     levofloxacin 500 MG tablet  Commonly known as:  LEVAQUIN  Take 1 tablet (500 mg total) by mouth every other day. For 6 more days     levothyroxine 75 MCG tablet  Commonly known as:  SYNTHROID, LEVOTHROID  Take 1 tablet (75 mcg total) by mouth daily before breakfast.     losartan 25 MG tablet  Commonly known as:  COZAAR  Take 1 tablet (25 mg total) by mouth daily.     metolazone 2.5 MG tablet  Commonly known as:  ZAROXOLYN  Take 1 tablet (2.5 mg total) by mouth every other day.     oseltamivir 30 MG capsule  Commonly known as:  TAMIFLU  Take 1 capsule (30 mg total) by mouth daily. For 2 more days     pantoprazole 40 MG tablet  Commonly known as:  PROTONIX  Take 1 tablet (40 mg total) by mouth daily.     polyethylene glycol packet  Commonly known as:  MIRALAX / GLYCOLAX  Take 17 g by mouth daily.     potassium chloride SA 20 MEQ tablet  Commonly known as:  K-DUR,KLOR-CON  Take 1 tablet (20 mEq total) by mouth daily.     PROCRIT 78938 UNIT/ML injection  Generic drug:  epoetin alfa  Inject 40,000 Units into the skin once a week.     simethicone 80 MG chewable tablet  Commonly known as:  MYLICON  Chew 1 tablet (80 mg total) by mouth 4 (four) times daily as needed for flatulence (gas).     torsemide 20 MG tablet  Commonly known as:  DEMADEX  Take 7 tablets (140 mg total) by mouth daily.     vitamin C 500 MG tablet  Commonly known as:  ASCORBIC ACID  Take 500 mg by mouth daily.       Allergies  Allergen Reactions  . Adhesive [Tape]   . Aspirin Other (See Comments)    Tendency to bleed easily       Follow-up Information   Follow up with Unice Cobble, MD. Schedule an appointment as soon as possible for a visit in 2 weeks.   Specialty:  Internal Medicine   Contact information:   [address] [address] [phone]        The results of significant diagnostics from  this hospitalization (including imaging, microbiology, ancillary and laboratory) are listed below for reference.    Significant Diagnostic Studies: Dg Chest 2 View  01/11/2014   CLINICAL DATA:  Cardiomyopathy, COPD  EXAM: CHEST  2 VIEW  COMPARISON:  07/03/2012  FINDINGS: Cardiac shadow remains enlarged. The lungs are well aerated bilaterally without focal infiltrate. A tiny right-sided pleural effusion is seen. No acute bony abnormality is noted.  IMPRESSION: Tiny right-sided pleural effusion.  Stable cardiomegaly.   Electronically Signed  By: Inez Catalina M.D.   On: 01/11/2014 08:55   Dg Abd 1 View  01/20/2014   CLINICAL DATA:  Abdominal bloating and nausea for the past 3 months.  EXAM: ABDOMEN - 1 VIEW  COMPARISON:  No priors.  FINDINGS: Gas and stool are noted throughout the colon extending to the level of the distal rectum. No pathologic distention of small bowel. No gross pneumoperitoneum on this single supine view of the abdomen. Extensive vascular calcifications are noted throughout the abdominal and pelvic vasculature.  IMPRESSION: 1. Nonobstructive bowel gas pattern. 2. No pneumoperitoneum. 3. Severe atherosclerosis.   Electronically Signed   By: Vinnie Langton M.D.   On: 01/20/2014 10:04   Dg Chest Port 1 View  02/01/2014   CLINICAL DATA:  Fever, cough, weakness and shortness of breath.  EXAM: PORTABLE CHEST - 1 VIEW  COMPARISON:  Chest radiograph from 01/11/2014  FINDINGS: The lungs are well-aerated. Vascular congestion is noted, with mildly increased interstitial markings. Mild bibasilar airspace opacities are seen. A small left pleural effusion is suspected. Opacities may reflect atelectasis, though pneumonia cannot be excluded. No pneumothorax is identified.  The cardiomediastinal silhouette is enlarged. No acute osseous abnormalities are seen.  IMPRESSION: Vascular congestion and cardiomegaly, with mildly increased interstitial markings. Mild bibasilar airspace opacities seen; small left  pleural effusion suspected. Opacities may reflect atelectasis, though pneumonia cannot be excluded, particularly given the patient's symptoms.   Electronically Signed   By: Garald Balding M.D.   On: 02/01/2014 21:48    Microbiology: Recent Results (from the past 240 hour(s))  CULTURE, BLOOD (ROUTINE X 2)     Status: None   Collection Time    02/01/14  7:50 PM      Result Value Ref Range Status   Specimen Description BLOOD RIGHT ARM   Final   Special Requests BOTTLES DRAWN AEROBIC AND ANAEROBIC 5ML   Final   Culture  Setup Time     Final   Value: 02/02/2014 00:57     Performed at [facility]   Culture     Final   Value:        BLOOD CULTURE RECEIVED NO GROWTH TO DATE CULTURE WILL BE HELD FOR 5 DAYS BEFORE ISSUING A FINAL NEGATIVE REPORT     Performed at [facility]   Report Status PENDING   Incomplete  CULTURE, BLOOD (ROUTINE X 2)     Status: None   Collection Time    02/01/14  8:10 PM      Result Value Ref Range Status   Specimen Description BLOOD RIGHT FOREARM   Final   Special Requests BOTTLES DRAWN AEROBIC ONLY 2ML   Final   Culture  Setup Time     Final   Value: 02/02/2014 00:57     Performed at [facility]   Culture     Final   Value:        BLOOD CULTURE RECEIVED NO GROWTH TO DATE CULTURE WILL BE HELD FOR 5 DAYS BEFORE ISSUING A FINAL NEGATIVE REPORT     Performed at [facility]   Report Status PENDING   Incomplete  URINE CULTURE     Status: None   Collection Time    02/01/14  8:26 PM      Result Value Ref Range Status   Specimen Description URINE, CLEAN CATCH   Final   Special Requests NONE   Final   Culture  Setup Time     Final  Value: 02/02/2014 01:10     Performed at [facility]     Final   Value: >=100,000 COLONIES/ML     Performed at [facility]   Culture     Final   Value: PROTEUS MIRABILIS     Performed at [facility]   Report Status 02/04/2014 FINAL   Final   Organism ID,  Bacteria PROTEUS MIRABILIS   Final  MRSA PCR SCREENING     Status: None   Collection Time    02/02/14 10:16 AM      Result Value Ref Range Status   MRSA by PCR NEGATIVE  NEGATIVE Final   Comment:            The GeneXpert MRSA Assay (FDA     approved for NASAL specimens     only), is one component of a     comprehensive MRSA colonization     surveillance program. It is not     intended to diagnose MRSA     infection nor to guide or     monitor treatment for     MRSA infections.  CLOSTRIDIUM DIFFICILE BY PCR     Status: None   Collection Time    02/02/14  7:09 PM      Result Value Ref Range Status   C difficile by pcr NEGATIVE  NEGATIVE Final   Comment: Performed at [facility], EXPECTORATED SPUTUM-ASSESSMENT     Status: None   Collection Time    02/03/14 11:34 AM      Result Value Ref Range Status   Specimen Description SPUTUM   Final   Special Requests NONE   Final   Sputum evaluation     Final   Value: THIS SPECIMEN IS ACCEPTABLE. RESPIRATORY CULTURE REPORT TO FOLLOW.   Report Status 02/03/2014 FINAL   Final  CULTURE, RESPIRATORY (NON-EXPECTORATED)     Status: None   Collection Time    02/03/14 11:34 AM      Result Value Ref Range Status   Specimen Description SPUTUM   Final   Special Requests NONE   Final   Gram Stain     Final   Value: NO WBC SEEN     NO SQUAMOUS EPITHELIAL CELLS SEEN     NO ORGANISMS SEEN     Performed at [facility]   Culture     Final   Value: NORMAL OROPHARYNGEAL FLORA     Performed at [facility]   Report Status PENDING   Incomplete     Labs: Basic Metabolic Panel:  Recent Labs Lab 02/01/14 1710 02/02/14 0600 02/04/14 0455  NA 139 140 144  K 3.7 3.0* 3.1*  CL 91* 100 106  CO2 32 22 21  GLUCOSE 93 100* 101*  BUN 100* 88* 86*  CREATININE 2.44* 2.06* 1.94*  CALCIUM 9.1 8.2* 8.6   Liver Function Tests:  Recent Labs Lab 02/01/14 1710  AST 18  ALT 8  ALKPHOS 81  BILITOT 0.4  PROT 6.0   ALBUMIN 2.3*   No results found for this basename: LIPASE, AMYLASE,  in the last 168 hours No results found for this basename: AMMONIA,  in the last 168 hours CBC:  Recent Labs Lab 02/01/14 1710 02/02/14 0600 02/04/14 0455  WBC 6.7 5.7 6.7  NEUTROABS 5.7  --   --   HGB 8.5* 8.0* 8.3*  HCT 27.8* 26.5* 27.0*  MCV 104.1* 105.6* 105.1*  PLT 163 134* 112*   Cardiac Enzymes:  Recent Labs Lab 02/02/14 0138 02/02/14 0601 02/02/14 1255  TROPONINI <0.30 <0.30 <0.30   BNP: BNP (last 3 results)  Recent Labs  02/09/13 1146 02/02/14 0138  PROBNP 222.0* 15475.0*   CBG: No results found for this basename: GLUCAP,  in the last 168 hours     Signed:  Lelon Frohlich  [facility] Pager: [phone] 02/05/2014, 10:34 AM

## 2014-02-05 NOTE — Progress Notes (Signed)
[facility] is providing the following services: Oxygen (will fax updated rx to office)  If patient discharges after hours, please call [phone].   Linward Headland 02/05/2014, 10:59 AM

## 2014-02-05 NOTE — Progress Notes (Signed)
ANTIBIOTIC CONSULT NOTE - Follow-up  Pharmacy Consult for Levaquin Indication: pneumonia / UTI  Allergies  Allergen Reactions  . Adhesive [Tape]   . Aspirin Other (See Comments)    Tendency to bleed easily    Patient Measurements: Height: 4\' 10"  (147.3 cm) Weight: 99 lb 3.3 oz (45 kg) IBW/kg (Calculated) : 40.9 Adjusted Body Weight:   Vital Signs: Temp: 97.9 F (36.6 C) (02/27 0605) Temp src: Oral (02/27 0605) BP: 123/60 mmHg (02/27 0605) Pulse Rate: 108 (02/27 0605) Intake/Output from previous day: 02/26 0701 - 02/27 0700 In: 1388.3 [P.O.:300; I.V.:1088.3] Out: -  Intake/Output from this shift:    Labs:  Recent Labs  02/04/14 0455  WBC 6.7  HGB 8.3*  PLT 112*  CREATININE 1.94*   Estimated Creatinine Clearance: 14.4 ml/min (by C-G formula based on Cr of 1.94). No results found for this basename: VANCOTROUGH, Corlis Leak, VANCORANDOM, [place], GENTPEAK, GENTRANDOM, TOBRATROUGH, TOBRAPEAK, TOBRARND, AMIKACINPEAK, AMIKACINTROU, AMIKACIN,  in the last 72 hours   Microbiology: Recent Results (from the past 720 hour(s))  MRSA PCR SCREENING     Status: None   Collection Time    01/11/14  7:52 PM      Result Value Ref Range Status   MRSA by PCR NEGATIVE  NEGATIVE Final   Comment:            The GeneXpert MRSA Assay (FDA     approved for NASAL specimens     only), is one component of a     comprehensive MRSA colonization     surveillance program. It is not     intended to diagnose MRSA     infection nor to guide or     monitor treatment for     MRSA infections.  CULTURE, BLOOD (ROUTINE X 2)     Status: None   Collection Time    02/01/14  7:50 PM      Result Value Ref Range Status   Specimen Description BLOOD RIGHT ARM   Final   Special Requests BOTTLES DRAWN AEROBIC AND ANAEROBIC 5ML   Final   Culture  Setup Time     Final   Value: 02/02/2014 00:57     Performed at [facility]   Culture     Final   Value:        BLOOD CULTURE RECEIVED NO GROWTH  TO DATE CULTURE WILL BE HELD FOR 5 DAYS BEFORE ISSUING A FINAL NEGATIVE REPORT     Performed at [facility]   Report Status PENDING   Incomplete  CULTURE, BLOOD (ROUTINE X 2)     Status: None   Collection Time    02/01/14  8:10 PM      Result Value Ref Range Status   Specimen Description BLOOD RIGHT FOREARM   Final   Special Requests BOTTLES DRAWN AEROBIC ONLY 2ML   Final   Culture  Setup Time     Final   Value: 02/02/2014 00:57     Performed at [facility]   Culture     Final   Value:        BLOOD CULTURE RECEIVED NO GROWTH TO DATE CULTURE WILL BE HELD FOR 5 DAYS BEFORE ISSUING A FINAL NEGATIVE REPORT     Performed at [facility]   Report Status PENDING   Incomplete  URINE CULTURE     Status: None   Collection Time    02/01/14  8:26 PM      Result Value  Ref Range Status   Specimen Description URINE, CLEAN CATCH   Final   Special Requests NONE   Final   Culture  Setup Time     Final   Value: 02/02/2014 01:10     Performed at [facility]     Final   Value: >=100,000 COLONIES/ML     Performed at [facility]   Culture     Final   Value: PROTEUS MIRABILIS     Performed at [facility]   Report Status 02/04/2014 FINAL   Final   Organism ID, Bacteria PROTEUS MIRABILIS   Final  MRSA PCR SCREENING     Status: None   Collection Time    02/02/14 10:16 AM      Result Value Ref Range Status   MRSA by PCR NEGATIVE  NEGATIVE Final   Comment:            The GeneXpert MRSA Assay (FDA     approved for NASAL specimens     only), is one component of a     comprehensive MRSA colonization     surveillance program. It is not     intended to diagnose MRSA     infection nor to guide or     monitor treatment for     MRSA infections.  CLOSTRIDIUM DIFFICILE BY PCR     Status: None   Collection Time    02/02/14  7:09 PM      Result Value Ref Range Status   C difficile by pcr NEGATIVE  NEGATIVE Final   Comment: Performed at  [facility], EXPECTORATED SPUTUM-ASSESSMENT     Status: None   Collection Time    02/03/14 11:34 AM      Result Value Ref Range Status   Specimen Description SPUTUM   Final   Special Requests NONE   Final   Sputum evaluation     Final   Value: THIS SPECIMEN IS ACCEPTABLE. RESPIRATORY CULTURE REPORT TO FOLLOW.   Report Status 02/03/2014 FINAL   Final  CULTURE, RESPIRATORY (NON-EXPECTORATED)     Status: None   Collection Time    02/03/14 11:34 AM      Result Value Ref Range Status   Specimen Description SPUTUM   Final   Special Requests NONE   Final   Gram Stain     Final   Value: NO WBC SEEN     NO SQUAMOUS EPITHELIAL CELLS SEEN     NO ORGANISMS SEEN     Performed at [facility]   Culture     Final   Value: NORMAL OROPHARYNGEAL FLORA     Performed at [facility]   Report Status PENDING   Incomplete    Medical History: Past Medical History  Diagnosis Date  . Restrictive cardiomyopathy   . Atrial fibrillation 1996    S/P cardioversion  . CVA (cerebral infarction) 1999  . CPAP (continuous positive airway pressure) dependence   . Anemia     NOS  . Iron deficiency   . Gout   . Peptic ulcer, acute with hemorrhage 2005, 2011  . Hypertension   . Hypothyroidism   . Diastolic dysfunction   . Adenomatous colon polyp   . GERD (gastroesophageal reflux disease)   . Hiatal hernia   . Diverticulosis   . Anxiety disorder   . Arthritis   . Internal and external hemorrhoids without complication   . Morbid obesity   .  CAD (coronary artery disease) 08/06/2008    One vessel by cath, EF overall preserved by echo 04/30/2008  . On home oxygen therapy   . Renal failure     Dr. Hassell Done  . Hyperlipidemia   . CHF (congestive heart failure)   . Complication of anesthesia     pt states she was sore "all-over" for over a week after anesthesia  . COAD (chronic obstructive airways disease) 2007    Exacerbation  . COPD (chronic obstructive pulmonary disease)      oxygen prn  . Sleep apnea     cpap - no longer uses  . Stroke     x2 -- effected vision; left sided slightly weak  . Rash     on upper extremities  . Bowel trouble     having constipation and diarrhea  . Family history of anesthesia complication     daughter had same "sore all over" experience with anesthesia  . AVM (arteriovenous malformation) of small bowel, acquired     2012 capsule endoscopy    Medications:  Anti-infectives   Start     Dose/Rate Route Frequency Ordered Stop   02/05/14 1400  levofloxacin (LEVAQUIN) tablet 500 mg     500 mg Oral Every 48 hours 02/03/14 1206     02/04/14 0600  vancomycin (VANCOCIN) 500 mg in sodium chloride 0.9 % 100 mL IVPB  Status:  Discontinued     500 mg 100 mL/hr over 60 Minutes Intravenous Every 48 hours 02/02/14 0544 02/03/14 1018   02/03/14 1400  levofloxacin (LEVAQUIN) IVPB 750 mg     750 mg 100 mL/hr over 90 Minutes Intravenous  Once 02/03/14 1206 02/03/14 1536   02/02/14 2200  ceFEPIme (MAXIPIME) 1 g in dextrose 5 % 50 mL IVPB  Status:  Discontinued     1 g 100 mL/hr over 30 Minutes Intravenous Every 24 hours 02/02/14 0544 02/03/14 1018   02/02/14 2200  oseltamivir (TAMIFLU) capsule 30 mg     30 mg Oral Daily 02/02/14 2131 02/07/14 0959   02/02/14 0115  ceFEPIme (MAXIPIME) 1 g in dextrose 5 % 50 mL IVPB  Status:  Discontinued     1 g 100 mL/hr over 30 Minutes Intravenous 3 times per day 02/02/14 0104 02/02/14 0543   02/01/14 2300  vancomycin (VANCOCIN) 1,000 mg in sodium chloride 0.9 % 250 mL IVPB     1,000 mg 250 mL/hr over 60 Minutes Intravenous  Once 02/01/14 2246 02/02/14 0129   02/01/14 2245  ceFEPIme (MAXIPIME) 1 g in dextrose 5 % 50 mL IVPB     1 g 100 mL/hr over 30 Minutes Intravenous  Once 02/01/14 2238 02/01/14 2341   02/01/14 2130  cefTRIAXone (ROCEPHIN) 1 g in dextrose 5 % 50 mL IVPB     1 g 100 mL/hr over 30 Minutes Intravenous  Once 02/01/14 2122 02/01/14 2156     Assessment: 80yo F with cough, SOB, fever,  hypotensive, purulent urine. Mult med problems including A.fib(not candidate for anticoag), recent CVA and AKA. DC'd 5 days prior to admission from CIR.  Pt initially received broad spectrum antibiotics for HCAP and proteus UTI, now to transition to levaquin.  Also on 5 day course tamiflu for influenza.   D#3 antibiotics 2/23 >> Rocephin x 1 2/24 >> vanc >> 2/25 2/24 >> cefepime >>2/25 2/24 >> oseltamivir >> (5 days) 2/25 >> levaquin >>   Tmax: AF WBCs: wnl Renal: CKD (baseline SCr appears 2.5-3) - SCr 1.94, CrCl 14  2/23 blood x2: NGTD 2/23 urine: >100K proteus (S levo) 2/24 MRSA PCR: neg 2/23 flu PCR: POSITIVE   Goal of Therapy:  Eradication of infection  Plan:  Continue Levaquin 500mg  PO q 48 hours to complete 10-14 days therapy.    Ralene Bathe, PharmD, BCPS 02/05/2014, 10:31 AM  Pager: [phone]

## 2014-02-05 NOTE — Progress Notes (Signed)
CSW confirmed address with patient's daughter. ptar called for transportation.  [name] C. [place] MSW, [place]

## 2014-02-07 ENCOUNTER — Inpatient Hospital Stay (HOSPITAL_COMMUNITY)
Admission: EM | Admit: 2014-02-07 | Discharge: 2014-02-13 | DRG: 291 | Disposition: A | Discharge status: Hospice | Payer: Medicare Other | Attending: Internal Medicine | Admitting: Internal Medicine

## 2014-02-07 ENCOUNTER — Encounter (HOSPITAL_COMMUNITY): Payer: Self-pay | Admitting: Emergency Medicine

## 2014-02-07 ENCOUNTER — Emergency Department (HOSPITAL_COMMUNITY): Payer: Medicare Other

## 2014-02-07 DIAGNOSIS — J9620 Acute and chronic respiratory failure, unspecified whether with hypoxia or hypercapnia: Secondary | ICD-10-CM

## 2014-02-07 DIAGNOSIS — E43 Unspecified severe protein-calorie malnutrition: Secondary | ICD-10-CM

## 2014-02-07 DIAGNOSIS — I5031 Acute diastolic (congestive) heart failure: Principal | ICD-10-CM | POA: Diagnosis present

## 2014-02-07 DIAGNOSIS — I6350 Cerebral infarction due to unspecified occlusion or stenosis of unspecified cerebral artery: Secondary | ICD-10-CM

## 2014-02-07 DIAGNOSIS — N390 Urinary tract infection, site not specified: Secondary | ICD-10-CM

## 2014-02-07 DIAGNOSIS — N186 End stage renal disease: Secondary | ICD-10-CM

## 2014-02-07 DIAGNOSIS — R404 Transient alteration of awareness: Secondary | ICD-10-CM | POA: Diagnosis present

## 2014-02-07 DIAGNOSIS — L89109 Pressure ulcer of unspecified part of back, unspecified stage: Secondary | ICD-10-CM | POA: Diagnosis present

## 2014-02-07 DIAGNOSIS — G4730 Sleep apnea, unspecified: Secondary | ICD-10-CM | POA: Diagnosis present

## 2014-02-07 DIAGNOSIS — R112 Nausea with vomiting, unspecified: Secondary | ICD-10-CM

## 2014-02-07 DIAGNOSIS — K5900 Constipation, unspecified: Secondary | ICD-10-CM

## 2014-02-07 DIAGNOSIS — R000 Tachycardia, unspecified: Secondary | ICD-10-CM | POA: Diagnosis present

## 2014-02-07 DIAGNOSIS — G4733 Obstructive sleep apnea (adult) (pediatric): Secondary | ICD-10-CM

## 2014-02-07 DIAGNOSIS — L98499 Non-pressure chronic ulcer of skin of other sites with unspecified severity: Secondary | ICD-10-CM

## 2014-02-07 DIAGNOSIS — Z8249 Family history of ischemic heart disease and other diseases of the circulatory system: Secondary | ICD-10-CM

## 2014-02-07 DIAGNOSIS — R6510 Systemic inflammatory response syndrome (SIRS) of non-infectious origin without acute organ dysfunction: Secondary | ICD-10-CM | POA: Diagnosis present

## 2014-02-07 DIAGNOSIS — G9341 Metabolic encephalopathy: Secondary | ICD-10-CM | POA: Diagnosis not present

## 2014-02-07 DIAGNOSIS — Z860100 Personal history of colon polyps, unspecified: Secondary | ICD-10-CM

## 2014-02-07 DIAGNOSIS — E039 Hypothyroidism, unspecified: Secondary | ICD-10-CM

## 2014-02-07 DIAGNOSIS — M79604 Pain in right leg: Secondary | ICD-10-CM

## 2014-02-07 DIAGNOSIS — N185 Chronic kidney disease, stage 5: Secondary | ICD-10-CM | POA: Diagnosis present

## 2014-02-07 DIAGNOSIS — I2789 Other specified pulmonary heart diseases: Secondary | ICD-10-CM | POA: Diagnosis present

## 2014-02-07 DIAGNOSIS — Z886 Allergy status to analgesic agent status: Secondary | ICD-10-CM

## 2014-02-07 DIAGNOSIS — R0902 Hypoxemia: Secondary | ICD-10-CM

## 2014-02-07 DIAGNOSIS — Z8739 Personal history of other diseases of the musculoskeletal system and connective tissue: Secondary | ICD-10-CM

## 2014-02-07 DIAGNOSIS — Z8711 Personal history of peptic ulcer disease: Secondary | ICD-10-CM

## 2014-02-07 DIAGNOSIS — N179 Acute kidney failure, unspecified: Secondary | ICD-10-CM | POA: Diagnosis present

## 2014-02-07 DIAGNOSIS — F411 Generalized anxiety disorder: Secondary | ICD-10-CM | POA: Diagnosis present

## 2014-02-07 DIAGNOSIS — I700 Atherosclerosis of aorta: Secondary | ICD-10-CM

## 2014-02-07 DIAGNOSIS — Z992 Dependence on renal dialysis: Secondary | ICD-10-CM

## 2014-02-07 DIAGNOSIS — I2510 Atherosclerotic heart disease of native coronary artery without angina pectoris: Secondary | ICD-10-CM | POA: Diagnosis present

## 2014-02-07 DIAGNOSIS — IMO0002 Reserved for concepts with insufficient information to code with codable children: Secondary | ICD-10-CM

## 2014-02-07 DIAGNOSIS — Z515 Encounter for palliative care: Secondary | ICD-10-CM

## 2014-02-07 DIAGNOSIS — Z89619 Acquired absence of unspecified leg above knee: Secondary | ICD-10-CM

## 2014-02-07 DIAGNOSIS — J09X2 Influenza due to identified novel influenza A virus with other respiratory manifestations: Secondary | ICD-10-CM

## 2014-02-07 DIAGNOSIS — K219 Gastro-esophageal reflux disease without esophagitis: Secondary | ICD-10-CM

## 2014-02-07 DIAGNOSIS — K648 Other hemorrhoids: Secondary | ICD-10-CM

## 2014-02-07 DIAGNOSIS — M79605 Pain in left leg: Secondary | ICD-10-CM

## 2014-02-07 DIAGNOSIS — J449 Chronic obstructive pulmonary disease, unspecified: Secondary | ICD-10-CM | POA: Diagnosis present

## 2014-02-07 DIAGNOSIS — J189 Pneumonia, unspecified organism: Secondary | ICD-10-CM

## 2014-02-07 DIAGNOSIS — I10 Essential (primary) hypertension: Secondary | ICD-10-CM

## 2014-02-07 DIAGNOSIS — Z8673 Personal history of transient ischemic attack (TIA), and cerebral infarction without residual deficits: Secondary | ICD-10-CM

## 2014-02-07 DIAGNOSIS — I428 Other cardiomyopathies: Secondary | ICD-10-CM | POA: Diagnosis present

## 2014-02-07 DIAGNOSIS — K259 Gastric ulcer, unspecified as acute or chronic, without hemorrhage or perforation: Secondary | ICD-10-CM

## 2014-02-07 DIAGNOSIS — I4510 Unspecified right bundle-branch block: Secondary | ICD-10-CM | POA: Diagnosis present

## 2014-02-07 DIAGNOSIS — D509 Iron deficiency anemia, unspecified: Secondary | ICD-10-CM

## 2014-02-07 DIAGNOSIS — N184 Chronic kidney disease, stage 4 (severe): Secondary | ICD-10-CM

## 2014-02-07 DIAGNOSIS — Z951 Presence of aortocoronary bypass graft: Secondary | ICD-10-CM

## 2014-02-07 DIAGNOSIS — I248 Other forms of acute ischemic heart disease: Secondary | ICD-10-CM

## 2014-02-07 DIAGNOSIS — L97919 Non-pressure chronic ulcer of unspecified part of right lower leg with unspecified severity: Secondary | ICD-10-CM

## 2014-02-07 DIAGNOSIS — I4891 Unspecified atrial fibrillation: Secondary | ICD-10-CM

## 2014-02-07 DIAGNOSIS — R634 Abnormal weight loss: Secondary | ICD-10-CM

## 2014-02-07 DIAGNOSIS — I120 Hypertensive chronic kidney disease with stage 5 chronic kidney disease or end stage renal disease: Secondary | ICD-10-CM | POA: Diagnosis present

## 2014-02-07 DIAGNOSIS — G8929 Other chronic pain: Secondary | ICD-10-CM

## 2014-02-07 DIAGNOSIS — M109 Gout, unspecified: Secondary | ICD-10-CM | POA: Diagnosis present

## 2014-02-07 DIAGNOSIS — S78119A Complete traumatic amputation at level between unspecified hip and knee, initial encounter: Secondary | ICD-10-CM

## 2014-02-07 DIAGNOSIS — I959 Hypotension, unspecified: Secondary | ICD-10-CM

## 2014-02-07 DIAGNOSIS — K921 Melena: Secondary | ICD-10-CM

## 2014-02-07 DIAGNOSIS — I2489 Other forms of acute ischemic heart disease: Secondary | ICD-10-CM

## 2014-02-07 DIAGNOSIS — I83019 Varicose veins of right lower extremity with ulcer of unspecified site: Secondary | ICD-10-CM

## 2014-02-07 DIAGNOSIS — R4181 Age-related cognitive decline: Secondary | ICD-10-CM | POA: Diagnosis present

## 2014-02-07 DIAGNOSIS — M25512 Pain in left shoulder: Secondary | ICD-10-CM

## 2014-02-07 DIAGNOSIS — L8994 Pressure ulcer of unspecified site, stage 4: Secondary | ICD-10-CM | POA: Diagnosis present

## 2014-02-07 DIAGNOSIS — R1013 Epigastric pain: Secondary | ICD-10-CM

## 2014-02-07 DIAGNOSIS — I509 Heart failure, unspecified: Secondary | ICD-10-CM

## 2014-02-07 DIAGNOSIS — Z9981 Dependence on supplemental oxygen: Secondary | ICD-10-CM

## 2014-02-07 DIAGNOSIS — E785 Hyperlipidemia, unspecified: Secondary | ICD-10-CM

## 2014-02-07 DIAGNOSIS — Z803 Family history of malignant neoplasm of breast: Secondary | ICD-10-CM

## 2014-02-07 DIAGNOSIS — K644 Residual hemorrhoidal skin tags: Secondary | ICD-10-CM

## 2014-02-07 DIAGNOSIS — Z66 Do not resuscitate: Secondary | ICD-10-CM | POA: Diagnosis present

## 2014-02-07 DIAGNOSIS — I70229 Atherosclerosis of native arteries of extremities with rest pain, unspecified extremity: Secondary | ICD-10-CM

## 2014-02-07 DIAGNOSIS — I739 Peripheral vascular disease, unspecified: Secondary | ICD-10-CM | POA: Diagnosis present

## 2014-02-07 DIAGNOSIS — R0989 Other specified symptoms and signs involving the circulatory and respiratory systems: Secondary | ICD-10-CM

## 2014-02-07 DIAGNOSIS — M7989 Other specified soft tissue disorders: Secondary | ICD-10-CM

## 2014-02-07 DIAGNOSIS — M129 Arthropathy, unspecified: Secondary | ICD-10-CM | POA: Diagnosis present

## 2014-02-07 DIAGNOSIS — D649 Anemia, unspecified: Secondary | ICD-10-CM

## 2014-02-07 DIAGNOSIS — J4489 Other specified chronic obstructive pulmonary disease: Secondary | ICD-10-CM | POA: Diagnosis present

## 2014-02-07 LAB — URINE MICROSCOPIC-ADD ON

## 2014-02-07 LAB — CBC WITH DIFFERENTIAL/PLATELET
BASOS ABS: 0 10*3/uL (ref 0.0–0.1)
Basophils Relative: 0 % (ref 0–1)
Eosinophils Absolute: 0.1 10*3/uL (ref 0.0–0.7)
Eosinophils Relative: 2 % (ref 0–5)
HEMATOCRIT: 29.1 % — AB (ref 36.0–46.0)
Hemoglobin: 8.7 g/dL — ABNORMAL LOW (ref 12.0–15.0)
Lymphocytes Relative: 11 % — ABNORMAL LOW (ref 12–46)
Lymphs Abs: 0.9 10*3/uL (ref 0.7–4.0)
MCH: 32 pg (ref 26.0–34.0)
MCHC: 29.9 g/dL — ABNORMAL LOW (ref 30.0–36.0)
MCV: 107 fL — ABNORMAL HIGH (ref 78.0–100.0)
Monocytes Absolute: 0.6 10*3/uL (ref 0.1–1.0)
Monocytes Relative: 7 % (ref 3–12)
NEUTROS ABS: 6.5 10*3/uL (ref 1.7–7.7)
NEUTROS PCT: 80 % — AB (ref 43–77)
Platelets: 144 10*3/uL — ABNORMAL LOW (ref 150–400)
RBC: 2.72 MIL/uL — ABNORMAL LOW (ref 3.87–5.11)
RDW: 17.6 % — AB (ref 11.5–15.5)
WBC: 8.1 10*3/uL (ref 4.0–10.5)

## 2014-02-07 LAB — I-STAT CG4 LACTIC ACID, ED: Lactic Acid, Venous: 0.8 mmol/L (ref 0.5–2.2)

## 2014-02-07 LAB — BASIC METABOLIC PANEL
BUN: 89 mg/dL — ABNORMAL HIGH (ref 6–23)
CHLORIDE: 106 meq/L (ref 96–112)
CO2: 22 meq/L (ref 19–32)
Calcium: 9.2 mg/dL (ref 8.4–10.5)
Creatinine, Ser: 2.98 mg/dL — ABNORMAL HIGH (ref 0.50–1.10)
GFR calc non Af Amer: 14 mL/min — ABNORMAL LOW (ref >90)
GFR, EST AFRICAN AMERICAN: 16 mL/min — AB (ref >90)
Glucose, Bld: 90 mg/dL (ref 70–99)
POTASSIUM: 4.5 meq/L (ref 3.7–5.3)
Sodium: 146 mEq/L (ref 137–147)

## 2014-02-07 LAB — URINALYSIS, ROUTINE W REFLEX MICROSCOPIC
Bilirubin Urine: NEGATIVE
Glucose, UA: NEGATIVE mg/dL
KETONES UR: NEGATIVE mg/dL
Nitrite: NEGATIVE
Protein, ur: 30 mg/dL — AB
Specific Gravity, Urine: 1.02 (ref 1.005–1.030)
UROBILINOGEN UA: 0.2 mg/dL (ref 0.0–1.0)
pH: 5.5 (ref 5.0–8.0)

## 2014-02-07 LAB — I-STAT TROPONIN, ED: TROPONIN I, POC: 0.1 ng/mL — AB (ref 0.00–0.08)

## 2014-02-07 MED ORDER — SODIUM CHLORIDE 0.9 % IJ SOLN
3.0000 mL | Freq: Two times a day (BID) | INTRAMUSCULAR | Status: DC
  Administered 2014-02-08 – 2014-02-12 (×10): 3 mL via INTRAVENOUS

## 2014-02-07 MED ORDER — ALBUTEROL SULFATE (2.5 MG/3ML) 0.083% IN NEBU: 2.5000 mg | INHALATION_SOLUTION | RESPIRATORY_TRACT | Status: DC | PRN

## 2014-02-07 MED ORDER — DEXTROSE 5 % IV SOLN
500.0000 mg | INTRAVENOUS | Status: DC
  Filled 2014-02-07 (×2): qty 0.5

## 2014-02-07 MED ORDER — DARBEPOETIN ALFA-POLYSORBATE 25 MCG/0.42ML IJ SOLN
25.0000 ug | INTRAMUSCULAR | Status: DC
  Filled 2014-02-07: qty 0.42

## 2014-02-07 MED ORDER — FENTANYL CITRATE 0.05 MG/ML IJ SOLN
50.0000 ug | INTRAMUSCULAR | Status: AC | PRN
  Administered 2014-02-07 (×4): 50 ug via INTRAVENOUS
  Filled 2014-02-07 (×4): qty 2

## 2014-02-07 MED ORDER — CALCITRIOL 0.5 MCG PO CAPS
0.5000 ug | ORAL_CAPSULE | ORAL | Status: DC
  Administered 2014-02-08 – 2014-02-10 (×2): 0.5 ug via ORAL
  Filled 2014-02-07 (×3): qty 1

## 2014-02-07 MED ORDER — ALLOPURINOL 100 MG PO TABS
100.0000 mg | ORAL_TABLET | ORAL | Status: DC
  Administered 2014-02-08 – 2014-02-10 (×2): 100 mg via ORAL
  Filled 2014-02-07 (×3): qty 1

## 2014-02-07 MED ORDER — METOPROLOL TARTRATE 12.5 MG HALF TABLET
12.5000 mg | ORAL_TABLET | Freq: Two times a day (BID) | ORAL | Status: DC
  Administered 2014-02-08: 12.5 mg via ORAL
  Filled 2014-02-07 (×3): qty 1

## 2014-02-07 MED ORDER — ONDANSETRON HCL 4 MG/2ML IJ SOLN: 4.0000 mg | Freq: Four times a day (QID) | INTRAMUSCULAR | Status: DC | PRN

## 2014-02-07 MED ORDER — ALPRAZOLAM 0.25 MG PO TABS
0.2500 mg | ORAL_TABLET | Freq: Two times a day (BID) | ORAL | Status: DC
  Administered 2014-02-08 (×3): 0.25 mg via ORAL
  Filled 2014-02-07 (×3): qty 1

## 2014-02-07 MED ORDER — HEPARIN SODIUM (PORCINE) 5000 UNIT/ML IJ SOLN
5000.0000 [IU] | Freq: Three times a day (TID) | INTRAMUSCULAR | Status: DC
  Administered 2014-02-08 – 2014-02-13 (×16): 5000 [IU] via SUBCUTANEOUS
  Filled 2014-02-07 (×20): qty 1

## 2014-02-07 MED ORDER — ASPIRIN EC 81 MG PO TBEC
81.0000 mg | DELAYED_RELEASE_TABLET | Freq: Every day | ORAL | Status: DC
  Administered 2014-02-08 – 2014-02-10 (×3): 81 mg via ORAL
  Filled 2014-02-07 (×6): qty 1

## 2014-02-07 MED ORDER — METOPROLOL TARTRATE 1 MG/ML IV SOLN
2.5000 mg | Freq: Once | INTRAVENOUS | Status: AC
  Administered 2014-02-07: 2.5 mg via INTRAVENOUS
  Filled 2014-02-07: qty 5

## 2014-02-07 MED ORDER — SIMETHICONE 80 MG PO CHEW
80.0000 mg | CHEWABLE_TABLET | Freq: Four times a day (QID) | ORAL | Status: DC | PRN
  Filled 2014-02-07: qty 1

## 2014-02-07 MED ORDER — PANTOPRAZOLE SODIUM 40 MG PO TBEC
40.0000 mg | DELAYED_RELEASE_TABLET | Freq: Every day | ORAL | Status: DC
  Administered 2014-02-08 – 2014-02-10 (×3): 40 mg via ORAL
  Filled 2014-02-07 (×3): qty 1

## 2014-02-07 MED ORDER — VANCOMYCIN HCL 500 MG IV SOLR
500.0000 mg | INTRAVENOUS | Status: DC
  Administered 2014-02-07: 500 mg via INTRAVENOUS
  Filled 2014-02-07: qty 500

## 2014-02-07 MED ORDER — ALBUTEROL SULFATE (2.5 MG/3ML) 0.083% IN NEBU
2.5000 mg | INHALATION_SOLUTION | Freq: Four times a day (QID) | RESPIRATORY_TRACT | Status: DC
  Administered 2014-02-08 – 2014-02-11 (×15): 2.5 mg via RESPIRATORY_TRACT
  Filled 2014-02-07 (×15): qty 3

## 2014-02-07 MED ORDER — SODIUM CHLORIDE 0.9 % IV SOLN: 250.0000 mL | INTRAVENOUS | Status: DC | PRN

## 2014-02-07 MED ORDER — DEXTROSE 5 % IV SOLN: 1.0000 g | Freq: Three times a day (TID) | INTRAVENOUS | Status: DC

## 2014-02-07 MED ORDER — LEVOTHYROXINE SODIUM 75 MCG PO TABS
75.0000 ug | ORAL_TABLET | Freq: Every day | ORAL | Status: DC
  Administered 2014-02-08 – 2014-02-10 (×3): 75 ug via ORAL
  Filled 2014-02-07 (×8): qty 1

## 2014-02-07 MED ORDER — SODIUM CHLORIDE 0.9 % IJ SOLN
3.0000 mL | Freq: Two times a day (BID) | INTRAMUSCULAR | Status: DC
  Administered 2014-02-09 – 2014-02-10 (×4): 3 mL via INTRAVENOUS

## 2014-02-07 MED ORDER — SODIUM CHLORIDE 0.9 % IJ SOLN: 3.0000 mL | INTRAMUSCULAR | Status: DC | PRN

## 2014-02-07 MED ORDER — MORPHINE SULFATE 2 MG/ML IJ SOLN
2.0000 mg | INTRAMUSCULAR | Status: DC | PRN
  Administered 2014-02-08 – 2014-02-09 (×4): 2 mg via INTRAVENOUS
  Filled 2014-02-07 (×4): qty 1

## 2014-02-07 MED ORDER — HYDROCODONE-ACETAMINOPHEN 5-325 MG PO TABS
1.0000 | ORAL_TABLET | ORAL | Status: DC | PRN
  Administered 2014-02-08: 2 via ORAL
  Administered 2014-02-08 – 2014-02-09 (×4): 1 via ORAL
  Administered 2014-02-09 (×2): 2 via ORAL
  Administered 2014-02-10: 1 via ORAL
  Administered 2014-02-10: 2 via ORAL
  Filled 2014-02-07: qty 1
  Filled 2014-02-07 (×4): qty 2
  Filled 2014-02-07 (×4): qty 1

## 2014-02-07 MED ORDER — ONDANSETRON HCL 4 MG PO TABS: 4.0000 mg | ORAL_TABLET | Freq: Four times a day (QID) | ORAL | Status: DC | PRN

## 2014-02-07 MED ORDER — POLYETHYLENE GLYCOL 3350 17 G PO PACK
17.0000 g | PACK | Freq: Every day | ORAL | Status: DC
  Administered 2014-02-08 – 2014-02-10 (×3): 17 g via ORAL
  Filled 2014-02-07 (×6): qty 1

## 2014-02-07 NOTE — ED Provider Notes (Signed)
CSN: [id number]     Arrival date & time 02/07/14  1649 History   First MD Initiated Contact with Patient 02/07/14 1742     Chief Complaint  Patient presents with  . Chest Pain  . Shortness of Breath     (Consider location/radiation/quality/duration/timing/severity/associated sxs/prior Treatment) Patient is a 80 y.o. female presenting with chest pain and shortness of breath. The history is provided by the patient.  Chest Pain Associated symptoms: shortness of breath   Shortness of Breath Associated symptoms: chest pain    She is here for evaluation of shortness of breath and decreased appetite. She began to be short of breath, particularly in the hospital, 2 days ago. She had been admitted and treated for influenza with UTI, has a secondary problem. She is on home oxygen. She presents for evaluation, by EMS. She's taking, her medications, as prescribed. There are no other known modifying factors.  Past Medical History  Diagnosis Date  . Restrictive cardiomyopathy   . Atrial fibrillation 1996    S/P cardioversion  . CVA (cerebral infarction) 1999  . CPAP (continuous positive airway pressure) dependence   . Anemia     NOS  . Iron deficiency   . Gout   . Peptic ulcer, acute with hemorrhage 2005, 2011  . Hypertension   . Hypothyroidism   . Diastolic dysfunction   . Adenomatous colon polyp   . GERD (gastroesophageal reflux disease)   . Hiatal hernia   . Diverticulosis   . Anxiety disorder   . Arthritis   . Internal and external hemorrhoids without complication   . Morbid obesity   . CAD (coronary artery disease) 08/06/2008    One vessel by cath, EF overall preserved by echo 04/30/2008  . On home oxygen therapy   . Renal failure     Dr. Hassell Done  . Hyperlipidemia   . CHF (congestive heart failure)   . Complication of anesthesia     pt states she was sore "all-over" for over a week after anesthesia  . COAD (chronic obstructive airways disease) 2007    Exacerbation  . COPD  (chronic obstructive pulmonary disease)     oxygen prn  . Sleep apnea     cpap - no longer uses  . Stroke     x2 -- effected vision; left sided slightly weak  . Rash     on upper extremities  . Bowel trouble     having constipation and diarrhea  . Family history of anesthesia complication     daughter had same "sore all over" experience with anesthesia  . AVM (arteriovenous malformation) of small bowel, acquired     2012 capsule endoscopy   Past Surgical History  Procedure Laterality Date  . Appendectomy    . Oophorectomy    . Incision and drainage of cellulitis  24/4010    Umbilical abcess  . Cardiac catheterization      Pulmonary HTN, non obstructive CAD  . Mole removal      on leg  . Vaginal hysterectomy    . Colonoscopy    . Bascilic vein transposition Left     Left upper arm  . Amputation Right 01/11/2014    Procedure: AMPUTATION ABOVE KNEE-RIGHT;  Surgeon: Conrad Hartman, MD;  Location: [facility];  Service: Vascular;  Laterality: Right;  . Esophagogastroduodenoscopy    . Givens capsule study     Family History  Problem Relation Age of Onset  . Breast cancer Mother   . Cancer Mother  breast  . Heart disease Father     MI  . Heart attack Father   . Breast cancer Sister   . Cancer Sister     breast  . Peripheral vascular disease Sister   . Heart disease Brother     CABG 5 vessel  . Rheum arthritis Brother   . Colon cancer Neg Hx    History  Substance Use Topics  . Smoking status: Never Smoker   . Smokeless tobacco: Never Used  . Alcohol Use: No   OB History   Grav Para Term Preterm Abortions TAB SAB Ect Mult Living                 Review of Systems  Respiratory: Positive for shortness of breath.   Cardiovascular: Positive for chest pain.  All other systems reviewed and are negative.      Allergies  Adhesive and Aspirin  Home Medications   Current Outpatient Rx  Name  Route  Sig  Dispense  Refill  . acetaminophen (TYLENOL) 325 MG tablet    Oral   Take 650 mg by mouth every 4 (four) hours as needed. For pain.         Marland Kitchen allopurinol (ZYLOPRIM) 100 MG tablet   Oral   Take 1 tablet (100 mg total) by mouth every other day.   30 tablet   1   . ALPRAZolam (XANAX) 0.25 MG tablet   Oral   Take 1 tablet (0.25 mg total) by mouth 2 (two) times daily.   60 tablet   1   . calcitRIOL (ROCALTROL) 0.5 MCG capsule   Oral   Take 1 capsule (0.5 mcg total) by mouth every other day.   30 capsule   1   . epoetin alfa (PROCRIT) 09811 UNIT/ML injection   Subcutaneous   Inject 40,000 Units into the skin once a week.          Marland Kitchen HYDROcodone-acetaminophen (NORCO/VICODIN) 5-325 MG per tablet   Oral   Take 1-2 tablets by mouth every 6 (six) hours as needed for moderate pain.   60 tablet   0   . levothyroxine (SYNTHROID, LEVOTHROID) 75 MCG tablet   Oral   Take 1 tablet (75 mcg total) by mouth daily before breakfast.   30 tablet   1   . losartan (COZAAR) 25 MG tablet   Oral   Take 1 tablet (25 mg total) by mouth daily.   30 tablet   1   . metolazone (ZAROXOLYN) 2.5 MG tablet   Oral   Take 1 tablet (2.5 mg total) by mouth every other day.   30 tablet   1   . oseltamivir (TAMIFLU) 30 MG capsule   Oral   Take 1 capsule (30 mg total) by mouth daily. For 2 more days   2 capsule   0   . pantoprazole (PROTONIX) 40 MG tablet   Oral   Take 1 tablet (40 mg total) by mouth daily.   30 tablet   1   . polyethylene glycol (MIRALAX / GLYCOLAX) packet   Oral   Take 17 g by mouth daily.   14 each   0   . potassium chloride SA (K-DUR,KLOR-CON) 20 MEQ tablet   Oral   Take 1 tablet (20 mEq total) by mouth daily.   30 tablet   1   . simethicone (MYLICON) 80 MG chewable tablet   Oral   Chew 1 tablet (80 mg total) by mouth 4 (  four) times daily as needed for flatulence (gas).   30 tablet   0   . torsemide (DEMADEX) 20 MG tablet   Oral   Take 7 tablets (140 mg total) by mouth daily.   30 tablet   1   . vitamin C (ASCORBIC  ACID) 500 MG tablet   Oral   Take 500 mg by mouth daily.           Marland Kitchen levofloxacin (LEVAQUIN) 500 MG tablet   Oral   Take 500 mg by mouth daily. For 6 more days. Finished course of medication on 02-06-14          BP 127/52  Pulse 114  Temp(Src) 98.5 F (36.9 C) (Rectal)  Resp 22  Wt 97 lb (43.999 kg)  SpO2 97% Physical Exam  Nursing note reviewed. Constitutional: She appears well-developed. She appears distressed (ill-appearing).  Frail, elderly  HENT:  Head: Normocephalic and atraumatic.  Eyes: Conjunctivae and EOM are normal. Pupils are equal, round, and reactive to light.  Neck: Normal range of motion and phonation normal. Neck supple.  Cardiovascular: Regular rhythm and intact distal pulses.   Tachycardic  Pulmonary/Chest: She is in respiratory distress. She has no rales. She exhibits no tenderness.  Tachypnea and increased work of breathing. Decreased breath sounds, right greater than left  Abdominal: Soft. She exhibits no distension. There is no tenderness. There is no guarding.  Musculoskeletal: Normal range of motion. She exhibits no edema and no tenderness.  Left posterior shoulder tenderness without deformity. No tenderness of the thoracic or lumbar spine.  Neurological: She is alert. She exhibits normal muscle tone. Coordination normal.  Skin: Skin is warm and dry.  Psychiatric: She has a normal mood and affect. Her behavior is normal.    ED Course  Procedures (including critical care time) Medications  fentaNYL (SUBLIMAZE) injection 50 mcg (50 mcg Intravenous Given 02/07/14 2048)  ceFEPIme (MAXIPIME) 500 mg in dextrose 5 % 50 mL IVPB (not administered)  vancomycin (VANCOCIN) 500 mg in sodium chloride 0.9 % 100 mL IVPB (500 mg Intravenous New Bag/Given 02/07/14 2029)    Patient Vitals for the past 24 hrs:  BP Temp Temp src Pulse Resp SpO2 Weight  02/07/14 2041 - 98.5 F (36.9 C) Rectal 114 22 97 % -  02/07/14 2001 127/52 mmHg - - 112 20 100 % -  02/07/14 1945  127/52 mmHg - - 116 - 100 % -  02/07/14 1852 131/60 mmHg - - - 18 98 % -  02/07/14 1701 138/66 mmHg 97.6 F (36.4 C) Oral 114 30 98 % 97 lb (43.999 kg)    8:51 PM Reevaluation with update and discussion. After initial assessment and treatment, an updated evaluation reveals She is more comfortable now. Rahma Meller L   8:51 PM-Consult complete with Hospitalist. Patient case explained and discussed. He agrees to admit patient for further evaluation and treatment. Call ended at 2054   CRITICAL CARE Performed by: Richarda Blade Total critical care time: 35 minutes Critical care time was exclusive of separately billable procedures and treating other patients. Critical care was necessary to treat or prevent imminent or life-threatening deterioration. Critical care was time spent personally by me on the following activities: development of treatment plan with patient and/or surrogate as well as nursing, discussions with consultants, evaluation of patient's response to treatment, examination of patient, obtaining history from patient or surrogate, ordering and performing treatments and interventions, ordering and review of laboratory studies, ordering and review of radiographic studies, pulse oximetry  and re-evaluation of patient's condition.  Labs Review Labs Reviewed  CBC WITH DIFFERENTIAL - Abnormal; Notable for the following:    RBC 2.72 (*)    Hemoglobin 8.7 (*)    HCT 29.1 (*)    MCV 107.0 (*)    MCHC 29.9 (*)    RDW 17.6 (*)    Platelets 144 (*)    Neutrophils Relative % 80 (*)    Lymphocytes Relative 11 (*)    All other components within normal limits  BASIC METABOLIC PANEL - Abnormal; Notable for the following:    BUN 89 (*)    Creatinine, Ser 2.98 (*)    GFR calc non Af Amer 14 (*)    GFR calc Af Amer 16 (*)    All other components within normal limits  I-STAT TROPOININ, ED - Abnormal; Notable for the following:    Troponin i, poc 0.10 (*)    All other components within  normal limits  CULTURE, BLOOD (ROUTINE X 2)  CULTURE, BLOOD (ROUTINE X 2)  URINE CULTURE  URINALYSIS, ROUTINE W REFLEX MICROSCOPIC  I-STAT CG4 LACTIC ACID, ED   Imaging Review Dg Chest 2 View  02/07/2014   CLINICAL DATA:  Chest pain and shortness of breath. Hypertension/ CHF.  EXAM: CHEST  2 VIEW  COMPARISON:  02/01/2014 and 01/11/2014  FINDINGS: Patient is rotated to the right. Exam demonstrates somewhat low lung volumes with new moderate opacification over the right mid to lower lung likely moderate size effusion with atelectasis. There is a small left effusion likely with associated atelectasis. Cannot exclude infection in the lung bases. There is moderate stable cardiomegaly. There is calcified plaque over the thoracoabdominal aorta. Remainder of the exam is unchanged.  IMPRESSION: Moderate opacification over the right mid to lower lung compatible with a moderate confusion likely with associated atelectasis. Small left effusion. Cannot exclude infection in the lung bases.  Moderate stable cardiomegaly.   Electronically Signed   By: Marin Olp M.D.   On: 02/07/2014 19:32     EKG Interpretation   Date/Time:  Sunday February 07 2014 16:58:47 EST Ventricular Rate:  113 PR Interval:  112 QRS Duration: 136 QT Interval:  350 QTC Calculation: 480 R Axis:   98 Text Interpretation:  Consider right atrial enlargement Right bundle  branch block Nonspecific T abnormalities, lateral leads Since last tracing  rate more regular,  Atrial Fibrillation, likely, query atrial electronic  pacing. Clinical correlation suggested. Confirmed by Eulis Foster  MD, Vira Agar  [phone]) on 02/07/2014 5:59:37 PM      MDM   Final diagnoses:  HCAP (healthcare-associated pneumonia)  Left shoulder pain  Hypoxia    Post influenza pneumonia, healthcare associated, she is improved with initial treatment is stable for admission to a step down unit.   Nursing Notes Reviewed/ Care Coordinated, and agree without  changes. Applicable Imaging Reviewed.  Interpretation of Laboratory Data incorporated into ED treatment   Plan: Admit    Richarda Blade, MD 02/07/14 2055

## 2014-02-07 NOTE — ED Notes (Signed)
Pt reports cp and sob since last week when she was discharged from [facility]. Pt had AKA to the right side in February. Pt states hasn't urinated since yesterday. Has fistula to left upper arm, that has never been used. Home health nurse was out to home yesterday and recommended ED evaluation on basis of increasing SOB and left sided back pain.

## 2014-02-07 NOTE — ED Notes (Signed)
Admitting MD at [facility].  

## 2014-02-07 NOTE — ED Notes (Signed)
Abnormal labs given to Dr. Wentz 

## 2014-02-07 NOTE — ED Notes (Addendum)
Have not administered lopressor as pt's BMI is 21. Hard stop to double check if pt's BMI is not greater than 35. Pharmacy called. Pharmacy states it is OK to give.

## 2014-02-07 NOTE — Progress Notes (Signed)
ANTIBIOTIC CONSULT NOTE - INITIAL  Pharmacy Consult for vancomycin and cefepime Indication: pneumonia  Allergies  Allergen Reactions  . Adhesive [Tape]   . Aspirin Other (See Comments)    Tendency to bleed easily    Patient Measurements: Weight: 97 lb (43.999 kg) Adjusted Body Weight:   Vital Signs: Temp: 97.6 F (36.4 C) (03/01 1701) Temp src: Oral (03/01 1701) BP: 131/60 mmHg (03/01 1852) Pulse Rate: 114 (03/01 1701) Intake/Output from previous day:   Intake/Output from this shift:    Labs:  Recent Labs  02/07/14 1726  WBC 8.1  HGB 8.7*  PLT 144*  CREATININE 2.98*   The CrCl is unknown because both a height and weight (above a minimum accepted value) are required for this calculation. No results found for this basename: VANCOTROUGH, Corlis Leak, VANCORANDOM, [place], GENTPEAK, GENTRANDOM, TOBRATROUGH, TOBRAPEAK, TOBRARND, AMIKACINPEAK, AMIKACINTROU, AMIKACIN,  in the last 72 hours   Microbiology: Recent Results (from the past 720 hour(s))  MRSA PCR SCREENING     Status: None   Collection Time    01/11/14  7:52 PM      Result Value Ref Range Status   MRSA by PCR NEGATIVE  NEGATIVE Final   Comment:            The GeneXpert MRSA Assay (FDA     approved for NASAL specimens     only), is one component of a     comprehensive MRSA colonization     surveillance program. It is not     intended to diagnose MRSA     infection nor to guide or     monitor treatment for     MRSA infections.  CULTURE, BLOOD (ROUTINE X 2)     Status: None   Collection Time    02/01/14  7:50 PM      Result Value Ref Range Status   Specimen Description BLOOD RIGHT ARM   Final   Special Requests BOTTLES DRAWN AEROBIC AND ANAEROBIC 5ML   Final   Culture  Setup Time     Final   Value: 02/02/2014 00:57     Performed at [facility]   Culture     Final   Value:        BLOOD CULTURE RECEIVED NO GROWTH TO DATE CULTURE WILL BE HELD FOR 5 DAYS BEFORE ISSUING A FINAL NEGATIVE REPORT      Performed at [facility]   Report Status PENDING   Incomplete  CULTURE, BLOOD (ROUTINE X 2)     Status: None   Collection Time    02/01/14  8:10 PM      Result Value Ref Range Status   Specimen Description BLOOD RIGHT FOREARM   Final   Special Requests BOTTLES DRAWN AEROBIC ONLY 2ML   Final   Culture  Setup Time     Final   Value: 02/02/2014 00:57     Performed at [facility]   Culture     Final   Value:        BLOOD CULTURE RECEIVED NO GROWTH TO DATE CULTURE WILL BE HELD FOR 5 DAYS BEFORE ISSUING A FINAL NEGATIVE REPORT     Performed at [facility]   Report Status PENDING   Incomplete  URINE CULTURE     Status: None   Collection Time    02/01/14  8:26 PM      Result Value Ref Range Status   Specimen Description URINE, CLEAN CATCH   Final  Special Requests NONE   Final   Culture  Setup Time     Final   Value: 02/02/2014 01:10     Performed at [facility]     Final   Value: >=100,000 COLONIES/ML     Performed at [facility]   Culture     Final   Value: PROTEUS MIRABILIS     Performed at [facility]   Report Status 02/04/2014 FINAL   Final   Organism ID, Bacteria PROTEUS MIRABILIS   Final  MRSA PCR SCREENING     Status: None   Collection Time    02/02/14 10:16 AM      Result Value Ref Range Status   MRSA by PCR NEGATIVE  NEGATIVE Final   Comment:            The GeneXpert MRSA Assay (FDA     approved for NASAL specimens     only), is one component of a     comprehensive MRSA colonization     surveillance program. It is not     intended to diagnose MRSA     infection nor to guide or     monitor treatment for     MRSA infections.  CLOSTRIDIUM DIFFICILE BY PCR     Status: None   Collection Time    02/02/14  7:09 PM      Result Value Ref Range Status   C difficile by pcr NEGATIVE  NEGATIVE Final   Comment: Performed at [facility], EXPECTORATED SPUTUM-ASSESSMENT     Status: None    Collection Time    02/03/14 11:34 AM      Result Value Ref Range Status   Specimen Description SPUTUM   Final   Special Requests NONE   Final   Sputum evaluation     Final   Value: THIS SPECIMEN IS ACCEPTABLE. RESPIRATORY CULTURE REPORT TO FOLLOW.   Report Status 02/03/2014 FINAL   Final  CULTURE, RESPIRATORY (NON-EXPECTORATED)     Status: None   Collection Time    02/03/14 11:34 AM      Result Value Ref Range Status   Specimen Description SPUTUM   Final   Special Requests NONE   Final   Gram Stain     Final   Value: NO WBC SEEN     NO SQUAMOUS EPITHELIAL CELLS SEEN     NO ORGANISMS SEEN     Performed at [facility]   Culture     Final   Value: NORMAL OROPHARYNGEAL FLORA     Performed at [facility]   Report Status 02/05/2014 FINAL   Final    Medical History: Past Medical History  Diagnosis Date  . Restrictive cardiomyopathy   . Atrial fibrillation 1996    S/P cardioversion  . CVA (cerebral infarction) 1999  . CPAP (continuous positive airway pressure) dependence   . Anemia     NOS  . Iron deficiency   . Gout   . Peptic ulcer, acute with hemorrhage 2005, 2011  . Hypertension   . Hypothyroidism   . Diastolic dysfunction   . Adenomatous colon polyp   . GERD (gastroesophageal reflux disease)   . Hiatal hernia   . Diverticulosis   . Anxiety disorder   . Arthritis   . Internal and external hemorrhoids without complication   . Morbid obesity   . CAD (coronary artery disease) 08/06/2008    One vessel by  cath, EF overall preserved by echo 04/30/2008  . On home oxygen therapy   . Renal failure     Dr. Hassell Done  . Hyperlipidemia   . CHF (congestive heart failure)   . Complication of anesthesia     pt states she was sore "all-over" for over a week after anesthesia  . COAD (chronic obstructive airways disease) 2007    Exacerbation  . COPD (chronic obstructive pulmonary disease)     oxygen prn  . Sleep apnea     cpap - no longer uses  . Stroke     x2  -- effected vision; left sided slightly weak  . Rash     on upper extremities  . Bowel trouble     having constipation and diarrhea  . Family history of anesthesia complication     daughter had same "sore all over" experience with anesthesia  . AVM (arteriovenous malformation) of small bowel, acquired     2012 capsule endoscopy    Medications:  Scheduled:   Infusions:   Assessment: 80 yo female with pneumonia will be put on vancomycin and cefepime.  She has stage IV CKD; SCr today is 2.98 (CrCl ~ 10).  Goal of Therapy:  Vancomycin trough level 15-20 mcg/ml  Plan:  1) Cefepime 500 mg iv q24h and vancomycin 500 mg iv q48h 2) Monitor renal function closely 3) f/u on culture and sensitivity and check vancomycin trough when it's appropriate.  Recie Cirrincione, Tsz-Yin 02/07/2014,7:49 PM

## 2014-02-07 NOTE — H&P (Addendum)
[facility]                                                                                    Patient Demographics  [name], is a 80 y.o. female  CSN: [id number]  MRN: [id number]  DOB - 1929-05-13  Admit Date - 02/07/2014  Outpatient Primary MD for the patient is Unice Cobble, MD   With History of -  Past Medical History  Diagnosis Date  . Restrictive cardiomyopathy   . Atrial fibrillation 1996    S/P cardioversion  . CVA (cerebral infarction) 1999  . CPAP (continuous positive airway pressure) dependence   . Anemia     NOS  . Iron deficiency   . Gout   . Peptic ulcer, acute with hemorrhage 2005, 2011  . Hypertension   . Hypothyroidism   . Diastolic dysfunction   . Adenomatous colon polyp   . GERD (gastroesophageal reflux disease)   . Hiatal hernia   . Diverticulosis   . Anxiety disorder   . Arthritis   . Internal and external hemorrhoids without complication   . Morbid obesity   . CAD (coronary artery disease) 08/06/2008    One vessel by cath, EF overall preserved by echo 04/30/2008  . On home oxygen therapy   . Renal failure     Dr. Hassell Done  . Hyperlipidemia   . CHF (congestive heart failure)   . Complication of anesthesia     pt states she was sore "all-over" for over a week after anesthesia  . COAD (chronic obstructive airways disease) 2007    Exacerbation  . COPD (chronic obstructive pulmonary disease)     oxygen prn  . Sleep apnea     cpap - no longer uses  . Stroke     x2 -- effected vision; left sided slightly weak  . Rash     on upper extremities  . Bowel trouble     having constipation and diarrhea  . Family history of anesthesia complication     daughter had same "sore all over" experience with anesthesia  . AVM (arteriovenous malformation) of small bowel, acquired     2012 capsule endoscopy      Past Surgical History  Procedure Laterality Date  . Appendectomy    . Oophorectomy    . Incision and drainage of cellulitis   [id number]    Umbilical abcess  . Cardiac catheterization      Pulmonary HTN, non obstructive CAD  . Mole removal      on leg  . Vaginal hysterectomy    . Colonoscopy    . Bascilic vein transposition Left     Left upper arm  . Amputation Right 01/11/2014    Procedure: AMPUTATION ABOVE KNEE-RIGHT;  Surgeon: Conrad Gordon, MD;  Location: [facility];  Service: Vascular;  Laterality: Right;  . Esophagogastroduodenoscopy    . Givens capsule study      in for   Chief Complaint  Patient presents with  . Chest Pain  . Shortness of Breath     HPI  [name]  is a 80 y.o. female, with past medical history significant for restrictive cardiomyopathy, renal  failure to be started on dialysis on who was recently admitted for flu A and discharged to 2 days ago , presenting today with shortness of breath and left shoulder pain. Patient denies fever chills nausea or vomiting. Patient reports nonproductive cough. Also reports orthopnea . Chest x-ray showed bilateral basal pneumonia and I was called to admit . Her troponin was slightly elevated and the patient was mildly hypotensive in the emergency room.    Review of Systems    In addition to the HPI above,  No Fever-chills, No Headache, No changes with Vision or hearing, No problems swallowing food or Liquids, No Chest pain, No Abdominal pain, No Nausea or Vommitting, Bowel movements are regular, No Blood in stool or Urine, No dysuria, No new skin rashes or bruises, No new weakness, tingling, numbness in any extremity, No recent weight gain or loss, No polyuria, polydypsia or polyphagia, No significant Mental Stressors.  A full 10 point Review of Systems was done, except as stated above, all other Review of Systems were negative.   Social History History  Substance Use Topics  . Smoking status: Never Smoker   . Smokeless tobacco: Never Used  . Alcohol Use: No     Family History Family History  Problem Relation Age of Onset  .  Breast cancer Mother   . Cancer Mother     breast  . Heart disease Father     MI  . Heart attack Father   . Breast cancer Sister   . Cancer Sister     breast  . Peripheral vascular disease Sister   . Heart disease Brother     CABG 5 vessel  . Rheum arthritis Brother   . Colon cancer Neg Hx      Prior to Admission medications   Medication Sig Start Date End Date Taking? Authorizing Provider  acetaminophen (TYLENOL) 325 MG tablet Take 650 mg by mouth every 4 (four) hours as needed. For pain.   Yes Historical Provider, MD  allopurinol (ZYLOPRIM) 100 MG tablet Take 1 tablet (100 mg total) by mouth every other day. 01/27/14  Yes Daniel J Angiulli, PA-C  ALPRAZolam (XANAX) 0.25 MG tablet Take 1 tablet (0.25 mg total) by mouth 2 (two) times daily. 01/27/14  Yes Daniel J Angiulli, PA-C  calcitRIOL (ROCALTROL) 0.5 MCG capsule Take 1 capsule (0.5 mcg total) by mouth every other day. 01/27/14  Yes Daniel J Angiulli, PA-C  epoetin alfa (PROCRIT) 91478 UNIT/ML injection Inject 40,000 Units into the skin once a week.    Yes Historical Provider, MD  HYDROcodone-acetaminophen (NORCO/VICODIN) 5-325 MG per tablet Take 1-2 tablets by mouth every 6 (six) hours as needed for moderate pain. 01/27/14  Yes Daniel J Angiulli, PA-C  levothyroxine (SYNTHROID, LEVOTHROID) 75 MCG tablet Take 1 tablet (75 mcg total) by mouth daily before breakfast. 01/27/14  Yes Daniel J Angiulli, PA-C  losartan (COZAAR) 25 MG tablet Take 1 tablet (25 mg total) by mouth daily. 01/27/14  Yes Daniel J Angiulli, PA-C  metolazone (ZAROXOLYN) 2.5 MG tablet Take 1 tablet (2.5 mg total) by mouth every other day. 01/27/14  Yes Daniel J Angiulli, PA-C  oseltamivir (TAMIFLU) 30 MG capsule Take 1 capsule (30 mg total) by mouth daily. For 2 more days 02/05/14  Yes Estela Leonie Green, MD  pantoprazole (PROTONIX) 40 MG tablet Take 1 tablet (40 mg total) by mouth daily. 01/27/14  Yes Daniel J Angiulli, PA-C  polyethylene glycol (MIRALAX / GLYCOLAX)  packet Take 17 g by mouth daily.  01/27/14  Yes Daniel J Angiulli, PA-C  potassium chloride SA (K-DUR,KLOR-CON) 20 MEQ tablet Take 1 tablet (20 mEq total) by mouth daily. 01/27/14  Yes Daniel J Angiulli, PA-C  simethicone (MYLICON) 80 MG chewable tablet Chew 1 tablet (80 mg total) by mouth 4 (four) times daily as needed for flatulence (gas). 01/27/14  Yes Daniel J Angiulli, PA-C  torsemide (DEMADEX) 20 MG tablet Take 7 tablets (140 mg total) by mouth daily. 01/27/14  Yes Daniel J Angiulli, PA-C  vitamin C (ASCORBIC ACID) 500 MG tablet Take 500 mg by mouth daily.     Yes Historical Provider, MD  levofloxacin (LEVAQUIN) 500 MG tablet Take 500 mg by mouth daily. For 6 more days. Finished course of medication on 02-06-14 02/05/14   Erline Hau, MD    Allergies  Allergen Reactions  . Adhesive [Tape]   . Aspirin Other (See Comments)    Tendency to bleed easily    Physical Exam  Vitals  Blood pressure 123/55, pulse 110, temperature 98.5 F (36.9 C), temperature source Rectal, resp. rate 22, weight 43.999 kg (97 lb), SpO2 99.00%.   1. General Chronically ill   2. Normal affect and insight, Not Suicidal or Homicidal, Awake Alert, Oriented X 3.  3. No F.N deficits, ALL C.Nerves Intact, Strength 5/5 all 4 extremities, Sensation intact all 4 extremities, Plantars down going.  4. Ears and Eyes appear Normal, Conjunctivae clear, PERRLA. Moist Oral Mucosa.  5. Supple Neck, No JVD, No cervical lymphadenopathy appriciated, No Carotid Bruits.  6. Symmetrical Chest wall movement, decreased breath sounds at the bases with mild scattered rhonchi.  7. RRR, No Gallops, Rubs or Murmurs, No Parasternal Heave.  8. Positive Bowel Sounds, Abdomen Soft, Non tender, No organomegaly appriciated,No rebound -guarding or rigidity.  9.  No Cyanosis, Normal Skin Turgor, No Skin Rash or Bruise.  10. Good muscle tone, status post right AKA.  11. No Palpable Lymph Nodes in Neck or Axillae    Data  Review  CBC  Recent Labs Lab 02/01/14 1710 02/02/14 0600 02/04/14 0455 02/07/14 1726  WBC 6.7 5.7 6.7 8.1  HGB 8.5* 8.0* 8.3* 8.7*  HCT 27.8* 26.5* 27.0* 29.1*  PLT 163 134* 112* 144*  MCV 104.1* 105.6* 105.1* 107.0*  MCH 31.8 31.9 32.3 32.0  MCHC 30.6 30.2 30.7 29.9*  RDW 17.1* 17.2* 17.1* 17.6*  LYMPHSABS 0.5*  --   --  0.9  MONOABS 0.5  --   --  0.6  EOSABS 0.0  --   --  0.1  BASOSABS 0.0  --   --  0.0   ------------------------------------------------------------------------------------------------------------------  Chemistries   Recent Labs Lab 02/01/14 1710 02/02/14 0600 02/04/14 0455 02/07/14 1726  NA 139 140 144 146  K 3.7 3.0* 3.1* 4.5  CL 91* 100 106 106  CO2 32 22 21 22   GLUCOSE 93 100* 101* 90  BUN 100* 88* 86* 89*  CREATININE 2.44* 2.06* 1.94* 2.98*  CALCIUM 9.1 8.2* 8.6 9.2  AST 18  --   --   --   ALT 8  --   --   --   ALKPHOS 81  --   --   --   BILITOT 0.4  --   --   --    Cardiac Enzymes  Recent Labs Lab 02/02/14 0138 02/02/14 0601 02/02/14 1255  TROPONINI <0.30 <0.30 <0.30     Urinalysis    Component Value Date/Time   COLORURINE YELLOW 02/07/2014 2042   APPEARANCEUR CLEAR 02/07/2014 2042  LABSPEC 1.020 02/07/2014 2042   PHURINE 5.5 02/07/2014 2042   GLUCOSEU NEGATIVE 02/07/2014 2042   HGBUR TRACE* 02/07/2014 2042   BILIRUBINUR NEGATIVE 02/07/2014 2042   KETONESUR NEGATIVE 02/07/2014 2042   PROTEINUR 30* 02/07/2014 2042   UROBILINOGEN 0.2 02/07/2014 2042   NITRITE NEGATIVE 02/07/2014 2042   LEUKOCYTESUR SMALL* 02/07/2014 2042    ----------------------------------------------------------------------------------------------------------------     Imaging results:   Dg Chest 2 View  02/07/2014   CLINICAL DATA:  Chest pain and shortness of breath. Hypertension/ CHF.  EXAM: CHEST  2 VIEW  COMPARISON:  02/01/2014 and 01/11/2014  FINDINGS: Patient is rotated to the right. Exam demonstrates somewhat low lung volumes with new moderate opacification  over the right mid to lower lung likely moderate size effusion with atelectasis. There is a small left effusion likely with associated atelectasis. Cannot exclude infection in the lung bases. There is moderate stable cardiomegaly. There is calcified plaque over the thoracoabdominal aorta. Remainder of the exam is unchanged.  IMPRESSION: Moderate opacification over the right mid to lower lung compatible with a moderate confusion likely with associated atelectasis. Small left effusion. Cannot exclude infection in the lung bases.  Moderate stable cardiomegaly.   Electronically Signed   By: Marin Olp M.D.   On: 02/07/2014 19:32   Dg Chest 2 View  01/11/2014   CLINICAL DATA:  Cardiomyopathy, COPD  EXAM: CHEST  2 VIEW  COMPARISON:  07/03/2012  FINDINGS: Cardiac shadow remains enlarged. The lungs are well aerated bilaterally without focal infiltrate. A tiny right-sided pleural effusion is seen. No acute bony abnormality is noted.  IMPRESSION: Tiny right-sided pleural effusion.  Stable cardiomegaly.   Electronically Signed   By: Inez Catalina M.D.   On: 01/11/2014 08:55   Dg Abd 1 View  01/20/2014   CLINICAL DATA:  Abdominal bloating and nausea for the past 3 months.  EXAM: ABDOMEN - 1 VIEW  COMPARISON:  No priors.  FINDINGS: Gas and stool are noted throughout the colon extending to the level of the distal rectum. No pathologic distention of small bowel. No gross pneumoperitoneum on this single supine view of the abdomen. Extensive vascular calcifications are noted throughout the abdominal and pelvic vasculature.  IMPRESSION: 1. Nonobstructive bowel gas pattern. 2. No pneumoperitoneum. 3. Severe atherosclerosis.   Electronically Signed   By: Vinnie Langton M.D.   On: 01/20/2014 10:04   Dg Chest Port 1 View  02/01/2014   CLINICAL DATA:  Fever, cough, weakness and shortness of breath.  EXAM: PORTABLE CHEST - 1 VIEW  COMPARISON:  Chest radiograph from 01/11/2014  FINDINGS: The lungs are well-aerated. Vascular  congestion is noted, with mildly increased interstitial markings. Mild bibasilar airspace opacities are seen. A small left pleural effusion is suspected. Opacities may reflect atelectasis, though pneumonia cannot be excluded. No pneumothorax is identified.  The cardiomediastinal silhouette is enlarged. No acute osseous abnormalities are seen.  IMPRESSION: Vascular congestion and cardiomegaly, with mildly increased interstitial markings. Mild bibasilar airspace opacities seen; small left pleural effusion suspected. Opacities may reflect atelectasis, though pneumonia cannot be excluded, particularly given the patient's symptoms.   Electronically Signed   By: Garald Balding M.D.   On: 02/01/2014 21:48    My personal review of EKG: RBBB , NS T wave changes rate 113 B/M    Assessment & Plan  1. healthcare associated pneumonia     Bilateral     Post flu , on vanc and cefepime     Continue on nebulizer treatment 2. SIRS  Continue with IV antibiotics     Blood pressure was in the 80s however it went up to 123456 systolic 3. Renal failure stage IV     To be started on hemodialysis soon     IV fluids started in the emergency room put on hold 4. Demand ischemia/tachycardia     Continue heparin     Aspirin     Lopressor 2.5 mg IV     Lopressor by mouth     Serial Troponins 5. Restrictive cardiomyopathy     Continue same medications 6. Peripheral vascular disease     Status post right above-the-knee amputation     Wound healing with primary intention , staples still present   DVT Prophylaxis Heparin  AM Labs Ordered, also please review Full Orders  Code Status full  Disposition Plan: Home with home health  Time spent in minutes : 42 minutes  Condition critical

## 2014-02-08 DIAGNOSIS — I509 Heart failure, unspecified: Secondary | ICD-10-CM

## 2014-02-08 DIAGNOSIS — J449 Chronic obstructive pulmonary disease, unspecified: Secondary | ICD-10-CM

## 2014-02-08 DIAGNOSIS — S78119A Complete traumatic amputation at level between unspecified hip and knee, initial encounter: Secondary | ICD-10-CM

## 2014-02-08 DIAGNOSIS — N184 Chronic kidney disease, stage 4 (severe): Secondary | ICD-10-CM

## 2014-02-08 DIAGNOSIS — I10 Essential (primary) hypertension: Secondary | ICD-10-CM

## 2014-02-08 DIAGNOSIS — I428 Other cardiomyopathies: Secondary | ICD-10-CM

## 2014-02-08 DIAGNOSIS — Z4789 Encounter for other orthopedic aftercare: Secondary | ICD-10-CM

## 2014-02-08 LAB — TROPONIN I

## 2014-02-08 LAB — LEGIONELLA ANTIGEN, URINE: Legionella Antigen, Urine: NEGATIVE

## 2014-02-08 LAB — CULTURE, BLOOD (ROUTINE X 2)
Culture: NO GROWTH
Culture: NO GROWTH

## 2014-02-08 LAB — STREP PNEUMONIAE URINARY ANTIGEN: Strep Pneumo Urinary Antigen: NEGATIVE

## 2014-02-08 MED ORDER — BOOST / RESOURCE BREEZE PO LIQD
1.0000 | Freq: Three times a day (TID) | ORAL | Status: DC
  Administered 2014-02-08 – 2014-02-10 (×7): 1 via ORAL

## 2014-02-08 MED ORDER — FUROSEMIDE 10 MG/ML IJ SOLN
80.0000 mg | Freq: Four times a day (QID) | INTRAMUSCULAR | Status: DC
  Administered 2014-02-08 – 2014-02-09 (×3): 80 mg via INTRAVENOUS
  Filled 2014-02-08 (×7): qty 8

## 2014-02-08 MED ORDER — AMOXICILLIN-POT CLAVULANATE 250-125 MG PO TABS
1.0000 | ORAL_TABLET | Freq: Two times a day (BID) | ORAL | Status: DC
  Administered 2014-02-08 – 2014-02-09 (×2): 1 via ORAL
  Filled 2014-02-08 (×3): qty 1

## 2014-02-08 MED ORDER — BIOTENE DRY MOUTH MT LIQD
15.0000 mL | Freq: Two times a day (BID) | OROMUCOSAL | Status: DC
  Administered 2014-02-09 – 2014-02-13 (×6): 15 mL via OROMUCOSAL

## 2014-02-08 MED ORDER — CARVEDILOL 3.125 MG PO TABS
3.1250 mg | ORAL_TABLET | Freq: Two times a day (BID) | ORAL | Status: DC
  Administered 2014-02-09 – 2014-02-10 (×3): 3.125 mg via ORAL
  Filled 2014-02-08 (×12): qty 1

## 2014-02-08 NOTE — Progress Notes (Signed)
[facility]  Patient Status: Active (receiving services up to time of hospitalization)  [facility] is providing the following services: RN, PT and OT  If patient discharges after hours, please call [phone].   [name] 02/08/2014, 9:56 AM

## 2014-02-08 NOTE — Progress Notes (Signed)
UR completed Arnesha Schiraldi K. Chanee Henrickson, RN, BSN, [place], CCM  02/08/2014 12:22 PM

## 2014-02-08 NOTE — Progress Notes (Signed)
[facility] PROGRESS NOTE  [name] [id number] DOB: 02-28-1929 DOA: 02/07/2014 PCP: Unice Cobble, MD  Assessment/Plan: 1-Acute on chronic resp failure: appears to be secondary to CHF exacerbation. Doubt PNA component -renal has been consulted, will follow rec's -currently holding diuretics -IV fluids changed to NSL -discontinue broad spectrum antibiotics; will narrow to just augmentin (finish previous antibiotic regimen)  2- acute on chronic renal failure (stage 4-5 at baseline): Cr worse.  -renal service consulted -will follow rec's  3-presumed demand ischemia/elvated troponin: rest of troponin neg. Assumed initial elevated troponin due to renal failure. -will continue low dose b-blocker -no need for cardiology consult at this point  4-restrictive cardiomyopathy and diastolic heart failure: strict I's and O's; diuretics per renal service. -might required to be on HD for volume control  5-PAD: s/p right AKA -vascular surgery consulted -will follow rec's  DVT: heparin  Code Status: Full Family Communication: granddaughter at bedside  Disposition Plan: to be determine; will most likely need SNF   Consultants:  Vascular surgery  Renal service   Procedures:  See below for x-ray reports  Antibiotics:  augmentin   HPI/Subjective: Mild resp distress, no fever, no CP. Mild JVD appreciated and sounds congested  Objective: Filed Vitals:   02/08/14 0940  BP: 87/32  Pulse: 61  Temp:   Resp:     Intake/Output Summary (Last 24 hours) at 02/08/14 1406 Last data filed at 02/08/14 1009  Gross per 24 hour  Intake      8 ml  Output    100 ml  Net    -92 ml   Filed Weights   02/07/14 2218 02/07/14 2328 02/08/14 0500  Weight: 43.999 kg (97 lb) 46.04 kg (101 lb 8 oz) 46.04 kg (101 lb 8 oz)    Exam:   General:  Mild respiratory distress, afebrile; denies CP  Cardiovascular: regular rate, no rubs or gallops, mild JVD  Respiratory: positive  crackles, decrease BS, no wheezing  Abdomen: soft, NT, NS, positive BS  Musculoskeletal: right AKA (wound with staple in place, mild erythema around staples but not frank infection; inner side with sero-sanguineous drainage)  Data Reviewed: Basic Metabolic Panel:  Recent Labs Lab 02/01/14 1710 02/02/14 0600 02/04/14 0455 02/07/14 1726  NA 139 140 144 146  K 3.7 3.0* 3.1* 4.5  CL 91* 100 106 106  CO2 32 22 21 22   GLUCOSE 93 100* 101* 90  BUN 100* 88* 86* 89*  CREATININE 2.44* 2.06* 1.94* 2.98*  CALCIUM 9.1 8.2* 8.6 9.2   Liver Function Tests:  Recent Labs Lab 02/01/14 1710  AST 18  ALT 8  ALKPHOS 81  BILITOT 0.4  PROT 6.0  ALBUMIN 2.3*   CBC:  Recent Labs Lab 02/01/14 1710 02/02/14 0600 02/04/14 0455 02/07/14 1726  WBC 6.7 5.7 6.7 8.1  NEUTROABS 5.7  --   --  6.5  HGB 8.5* 8.0* 8.3* 8.7*  HCT 27.8* 26.5* 27.0* 29.1*  MCV 104.1* 105.6* 105.1* 107.0*  PLT 163 134* 112* 144*   Cardiac Enzymes:  Recent Labs Lab 02/02/14 0601 02/02/14 1255 02/08/14 0116 02/08/14 0350 02/08/14 1023  TROPONINI <0.30 <0.30 <0.30 <0.30 <0.30   BNP (last 3 results)  Recent Labs  02/09/13 1146 02/02/14 0138  PROBNP 222.0* 15475.0*   CBG: No results found for this basename: GLUCAP,  in the last 168 hours  Recent Results (from the past 240 hour(s))  CULTURE, BLOOD (ROUTINE X 2)     Status: None   Collection Time  02/01/14  7:50 PM      Result Value Ref Range Status   Specimen Description BLOOD RIGHT ARM   Final   Special Requests BOTTLES DRAWN AEROBIC AND ANAEROBIC 5ML   Final   Culture  Setup Time     Final   Value: 02/02/2014 00:57     Performed at [facility]   Culture     Final   Value: NO GROWTH 5 DAYS     Performed at [facility]   Report Status 02/08/2014 FINAL   Final  CULTURE, BLOOD (ROUTINE X 2)     Status: None   Collection Time    02/01/14  8:10 PM      Result Value Ref Range Status   Specimen Description BLOOD RIGHT FOREARM    Final   Special Requests BOTTLES DRAWN AEROBIC ONLY 2ML   Final   Culture  Setup Time     Final   Value: 02/02/2014 00:57     Performed at [facility]   Culture     Final   Value: NO GROWTH 5 DAYS     Performed at [facility]   Report Status 02/08/2014 FINAL   Final  URINE CULTURE     Status: None   Collection Time    02/01/14  8:26 PM      Result Value Ref Range Status   Specimen Description URINE, CLEAN CATCH   Final   Special Requests NONE   Final   Culture  Setup Time     Final   Value: 02/02/2014 01:10     Performed at [facility] [place]     Final   Value: >=100,000 COLONIES/ML     Performed at [facility]   Culture     Final   Value: PROTEUS MIRABILIS     Performed at [facility]   Report Status 02/04/2014 FINAL   Final   Organism ID, Bacteria PROTEUS MIRABILIS   Final  MRSA PCR SCREENING     Status: None   Collection Time    02/02/14 10:16 AM      Result Value Ref Range Status   MRSA by PCR NEGATIVE  NEGATIVE Final   Comment:            The GeneXpert MRSA Assay (FDA     approved for NASAL specimens     only), is one component of a     comprehensive MRSA colonization     surveillance program. It is not     intended to diagnose MRSA     infection nor to guide or     monitor treatment for     MRSA infections.  CLOSTRIDIUM DIFFICILE BY PCR     Status: None   Collection Time    02/02/14  7:09 PM      Result Value Ref Range Status   C difficile by pcr NEGATIVE  NEGATIVE Final   Comment: Performed at [place], EXPECTORATED SPUTUM-ASSESSMENT     Status: None   Collection Time    02/03/14 11:34 AM      Result Value Ref Range Status   Specimen Description SPUTUM   Final   Special Requests NONE   Final   Sputum evaluation     Final   Value: THIS SPECIMEN IS ACCEPTABLE. RESPIRATORY CULTURE REPORT TO FOLLOW.   Report Status 02/03/2014 FINAL   Final  CULTURE, RESPIRATORY (NON-EXPECTORATED)  Status: None   Collection Time    02/03/14 11:34 AM      Result Value Ref Range Status   Specimen Description SPUTUM   Final   Special Requests NONE   Final   Gram Stain     Final   Value: NO WBC SEEN     NO SQUAMOUS EPITHELIAL CELLS SEEN     NO ORGANISMS SEEN     Performed at [facility]   Culture     Final   Value: NORMAL OROPHARYNGEAL FLORA     Performed at [facility]   Report Status 02/05/2014 FINAL   Final     Studies: Dg Chest 2 View  02/07/2014   CLINICAL DATA:  Chest pain and shortness of breath. Hypertension/ CHF.  EXAM: CHEST  2 VIEW  COMPARISON:  02/01/2014 and 01/11/2014  FINDINGS: Patient is rotated to the right. Exam demonstrates somewhat low lung volumes with new moderate opacification over the right mid to lower lung likely moderate size effusion with atelectasis. There is a small left effusion likely with associated atelectasis. Cannot exclude infection in the lung bases. There is moderate stable cardiomegaly. There is calcified plaque over the thoracoabdominal aorta. Remainder of the exam is unchanged.  IMPRESSION: Moderate opacification over the right mid to lower lung compatible with a moderate confusion likely with associated atelectasis. Small left effusion. Cannot exclude infection in the lung bases.  Moderate stable cardiomegaly.   Electronically Signed   By: Marin Olp M.D.   On: 02/07/2014 19:32    Scheduled Meds: . albuterol  2.5 mg Nebulization Q6H  . allopurinol  100 mg Oral QODAY  . ALPRAZolam  0.25 mg Oral BID  . amoxicillin-clavulanate  1 tablet Oral 3 times per day  . [START ON 02/09/2014] antiseptic oral rinse  15 mL Mouth Rinse BID  . aspirin EC  81 mg Oral Daily  . calcitRIOL  0.5 mcg Oral QODAY  . carvedilol  3.125 mg Oral BID WC  . [START ON 02/12/2014] darbepoetin  25 mcg Subcutaneous Q7 days  . feeding supplement (RESOURCE BREEZE)  1 Container Oral TID BM  . heparin  5,000 Units Subcutaneous 3 times per day  . levothyroxine   75 mcg Oral QAC breakfast  . pantoprazole  40 mg Oral Daily  . polyethylene glycol  17 g Oral Daily  . sodium chloride  3 mL Intravenous Q12H  . sodium chloride  3 mL Intravenous Q12H     Time spent: >30 minutes   Brodin Gelpi  [facility] Pager [phone]. If 7PM-7AM, please contact night-coverage at [email or web address], password [email or web address] 02/08/2014, 2:06 PM  LOS: 1 day

## 2014-02-08 NOTE — Progress Notes (Signed)
02/08/14 Bladder scan performed on patient, 97 cc's scanned in patient's bladder.  Patient not taking in a lot of fluids at this time with no urine output this shift.  Will continue to monitor.  Catha Gosselin RN

## 2014-02-08 NOTE — Progress Notes (Signed)
INITIAL NUTRITION ASSESSMENT  DOCUMENTATION CODES Per approved criteria  -Severe malnutrition in the context of chronic illness  Pt meets criteria for SEVERE MALNUTRITION in the context of CHRONIC ILLNESS as evidenced by >6% weight loss in less than one month and severe muscle wasting.  INTERVENTION: Provide Resource Breeze TID Provide Multivitamin with minerals daily Diet advancement per MD; RD to continue to monitor nutrition care plan  NUTRITION DIAGNOSIS: Inadequate oral intake related to decreased appetite as evidenced by 8% weight loss in one month per family report.   Goal: Pt to meet >/= 90% of their estimated nutrition needs   Monitor:  Diet advancement, PO intake, weight, labs, plan of care (? HD)  Reason for Assessment: Malnutrition Screening Tool, score of 2  80 y.o. female  Admitting Dx: Bilateral pneumonia  ASSESSMENT: 80 y.o. female, with past medical history significant for restrictive cardiomyopathy, renal failure to be started on dialysis on who was recently admitted for flu A and discharged to 2 days ago , presenting today with shortness of breath and left shoulder pain. Patient denies fever chills nausea or vomiting. Patient reports nonproductive cough. Also reports orthopnea . Chest x-ray showed bilateral basal pneumonia and I was called to admit .   Pt asleep at time of visit. Per pt's granddaughter at bedside, pt has only eaten 2 bites of jello and 2 bites of applesauce today. Gradaughter reports that pt usually "eats like a bird", she was eating normally while in rehab but, has been eating very little for the past week. Per granddaughter pt weighed 106 lbs after right AKA surgery but, pt has noticeabley been losing weight with most recent weight of 97 lbs PTA. Pt has been drinking Resource Breeze TID for the past month.  Pt with moderate temporal wasting and severe clavicle wasting.   Height: Ht Readings from Last 1 Encounters:  02/07/14 4\' 9"  (1.448 m)     Weight: Wt Readings from Last 1 Encounters:  02/08/14 101 lb 8 oz (46.04 kg)    Ideal Body Weight: 95 lbs  % Ideal Body Weight: 106%  Wt Readings from Last 10 Encounters:  02/08/14 101 lb 8 oz (46.04 kg)  02/05/14 99 lb 3.3 oz (45 kg)  01/20/14 107 lb 5.8 oz (48.7 kg)  01/12/14 108 lb 11 oz (49.3 kg)  01/12/14 108 lb 11 oz (49.3 kg)  12/31/13 110 lb (49.896 kg)  12/31/13 110 lb (49.896 kg)  12/25/13 111 lb (50.349 kg)  12/09/13 111 lb (50.349 kg)  10/01/13 117 lb 12.8 oz (53.434 kg)    Usual Body Weight: 106 lbs  % Usual Body Weight: 95%  BMI:  Body mass index is 21.96 kg/(m^2).  Estimated Nutritional Needs: Kcal: 1380-1600 Protein: 50-60 grams (60-70 grams if started on HD) Fluid: 1.4 L/day  Skin: non-pitting RLE and LLE edema; stage 2 pressure ulcer on coccyx  Diet Order: Clear Liquid  EDUCATION NEEDS: -Education not appropriate at this time   Intake/Output Summary (Last 24 hours) at 02/08/14 1112 Last data filed at 02/08/14 1009  Gross per 24 hour  Intake      8 ml  Output    100 ml  Net    -92 ml    Last BM: 2/26  Labs:   Recent Labs Lab 02/02/14 0600 02/04/14 0455 02/07/14 1726  NA 140 144 146  K 3.0* 3.1* 4.5  CL 100 106 106  CO2 22 21 22   BUN 88* 86* 89*  CREATININE 2.06* 1.94* 2.98*  CALCIUM  8.2* 8.6 9.2  GLUCOSE 100* 101* 90    CBG (last 3)  No results found for this basename: GLUCAP,  in the last 72 hours  Scheduled Meds: . albuterol  2.5 mg Nebulization Q6H  . allopurinol  100 mg Oral QODAY  . ALPRAZolam  0.25 mg Oral BID  . [START ON 02/09/2014] antiseptic oral rinse  15 mL Mouth Rinse BID  . aspirin EC  81 mg Oral Daily  . calcitRIOL  0.5 mcg Oral QODAY  . ceFEPime (MAXIPIME) IV  500 mg Intravenous Q24H  . [START ON 02/12/2014] darbepoetin  25 mcg Subcutaneous Q7 days  . heparin  5,000 Units Subcutaneous 3 times per day  . levothyroxine  75 mcg Oral QAC breakfast  . metoprolol tartrate  12.5 mg Oral BID  . pantoprazole   40 mg Oral Daily  . polyethylene glycol  17 g Oral Daily  . sodium chloride  3 mL Intravenous Q12H  . sodium chloride  3 mL Intravenous Q12H  . vancomycin  500 mg Intravenous Q48H    Continuous Infusions:   Past Medical History  Diagnosis Date  . Restrictive cardiomyopathy   . Atrial fibrillation 1996    S/P cardioversion  . CVA (cerebral infarction) 1999  . CPAP (continuous positive airway pressure) dependence   . Anemia     NOS  . Iron deficiency   . Gout   . Peptic ulcer, acute with hemorrhage 2005, 2011  . Hypertension   . Hypothyroidism   . Diastolic dysfunction   . Adenomatous colon polyp   . GERD (gastroesophageal reflux disease)   . Hiatal hernia   . Diverticulosis   . Anxiety disorder   . Arthritis   . Internal and external hemorrhoids without complication   . Morbid obesity   . CAD (coronary artery disease) 08/06/2008    One vessel by cath, EF overall preserved by echo 04/30/2008  . On home oxygen therapy   . Renal failure     Dr. Hassell Done  . Hyperlipidemia   . CHF (congestive heart failure)   . Complication of anesthesia     pt states she was sore "all-over" for over a week after anesthesia  . COAD (chronic obstructive airways disease) 2007    Exacerbation  . COPD (chronic obstructive pulmonary disease)     oxygen prn  . Sleep apnea     cpap - no longer uses  . Stroke     x2 -- effected vision; left sided slightly weak  . Rash     on upper extremities  . Bowel trouble     having constipation and diarrhea  . Family history of anesthesia complication     daughter had same "sore all over" experience with anesthesia  . AVM (arteriovenous malformation) of small bowel, acquired     2012 capsule endoscopy    Past Surgical History  Procedure Laterality Date  . Appendectomy    . Oophorectomy    . Incision and drainage of cellulitis  50/0938    Umbilical abcess  . Cardiac catheterization      Pulmonary HTN, non obstructive CAD  . Mole removal      on  leg  . Vaginal hysterectomy    . Colonoscopy    . Bascilic vein transposition Left     Left upper arm  . Amputation Right 01/11/2014    Procedure: AMPUTATION ABOVE KNEE-RIGHT;  Surgeon: Conrad [place], MD;  Location: [facility];  Service: Vascular;  Laterality: Right;  .  Esophagogastroduodenoscopy    . Givens capsule study      Pryor Ochoa RD, LDN Inpatient Clinical Dietitian Pager: [phone] After Hours Pager: [phone]

## 2014-02-08 NOTE — Evaluation (Signed)
Physical Therapy Evaluation Patient Details Name: [name] MRN: [id number] DOB: 12/05/1929 Today's Date: 02/08/2014 Time: 1133-1200 PT Time Calculation (min): 27 min  PT Assessment / Plan / Recommendation History of Present Illness  pt was admitted with SOB. She has h/o R AKA 2/ 2/15 and recently went to [facility] for rehab.  She was home for 3 days then admitted to [facility] with PNA. DC after that admission home grossly 1wk and returned with bil PNA.  Pt also has COPD, h/o CVA and CHF.  Clinical Impression  Pt lethargic throughout with report of having not slept for 3 days prior to admission and happy to be getting some sleep. Pt with progressive weakness since DC from [facility] and continued decline with family reporting they feel they will need additional assist with ST-SNF after DC which I agree would be most beneficial for pt given repeat admissions, weakness and below deficits. Pt will benefit from acute therapy to maximize mobility, strength and function to decrease burden of care at DC. Pt with dressing on residual limb that has not been changed since Sat per family and RN notified also noted sacral wound which family states [facility] has been addressing. Pt in semisidely left with pillow under right hip end of session.     PT Assessment  Patient needs continued PT services    Follow Up Recommendations  SNF;Supervision/Assistance - 24 hour    Does the patient have the potential to tolerate intense rehabilitation      Barriers to Discharge        Equipment Recommendations  None recommended by PT    Recommendations for Other Services OT consult   Frequency Min 3X/week    Precautions / Restrictions Precautions Precautions: Fall Precaution Comments: R AKA, stage 2 sacral wound   Pertinent Vitals/Pain Pt reports sacral pain with mobility, faces grossly 4/10 Repositioned, premedicated Pt on 2L with VSS throughout      Mobility  Bed Mobility Overal bed mobility: Needs  Assistance Rolling: Max assist Sidelying to sit: Max assist Sit to supine: Max assist General bed mobility comments: max assist to rotate pelvis with rolling bil x 3 each direction for linen change and sacral dressing change as well as positioning on pillow end of session. Max assist into sitting and return to supine due to lethargy and weakness with 2 person assist to scoot to [place] pt to reduce friction on sacrum    Exercises     PT Diagnosis: Generalized weakness;Acute pain  PT Problem List: Decreased strength;Decreased activity tolerance;Decreased balance;Decreased mobility;Decreased cognition;Pain PT Treatment Interventions: DME instruction;Functional mobility training;Therapeutic activities;Therapeutic exercise;Wheelchair mobility training;Patient/family education;Balance training;Cognitive remediation     PT Goals(Current goals can be found in the care plan section) Acute Rehab PT Goals Patient Stated Goal: be able to get to chair PT Goal Formulation: With patient/family Time For Goal Achievement: 02/22/14 Potential to Achieve Goals: Fair  Visit Information  Last PT Received On: 02/08/14 Assistance Needed: +1 History of Present Illness: pt was admitted with SOB. She has h/o R AKA 2/ 2/15 and recently went to [facility] for rehab.  She was home for 3 days then admitted to [facility] with PNA. DC after that admission home grossly 1wk and returned with bil PNA.  Pt also has COPD, h/o CVA and CHF.       Prior [place] expects to be discharged to:: Skilled nursing facility Living Arrangements: Spouse/significant other Available Help at Discharge: Family;Available 24 hours/day Type of Home: [place]  Access: Stairs to enter [employer] of Steps: 5 at front, 10 at rear Entrance Stairs-Rails: Left;Right Home Layout: Able to live on main level with bedroom/bathroom Alternate Level Stairs-Number of Steps: 2 Home Equipment: Walker - 4 wheels;Cane  - single point;Bedside commode;Wheelchair - manual  Lives With: Spouse Prior Function Level of Independence: Needs assistance Gait / Transfers Assistance Needed: Was performing sliding board transfers with Min assist when pt left [facility]. After admission to [facility] has not been able to transfer [place], has been using bedpan and max assist for rolling and sitting EOB Communication Communication: HOH Dominant Hand: Right    Cognition  Cognition Arousal/Alertness: Lethargic Behavior During Therapy: Flat affect Overall Cognitive Status: Difficult to assess Difficult to assess due to: Level of arousal    Extremity/Trunk Assessment Upper Extremity Assessment Upper Extremity Assessment: Generalized weakness Lower Extremity Assessment Lower Extremity Assessment: Generalized weakness   Balance Balance Overall balance assessment: Needs assistance Sitting-balance support: Bilateral upper extremity supported;Feet supported Sitting balance-Leahy Scale: Poor Sitting balance - Comments: EOB 6 min with minguard<>min assist with anterior lean and excessive flexion with pt unable to achieve upright trunk  End of Session PT - End of Session Equipment Utilized During Treatment: Oxygen Activity Tolerance: Patient limited by fatigue;Patient limited by lethargy Patient left: in bed;with call bell/phone within reach;with nursing/sitter in room;with family/visitor present Nurse Communication: Mobility status;Precautions  GP     Lanetta Inch Beth 02/08/2014, 12:46 PM Elwyn Reach, [place]

## 2014-02-08 NOTE — Progress Notes (Signed)
PRN morphine given prior to changing dressing. Old dressing taken off right stump. Cleansed open area. Little to no drainage present. Wet to dry dry dressing applied to Right stump and secured with tape. Dressing now clean dry and intact. Will continue to monitor patient to end of shift.

## 2014-02-08 NOTE — Progress Notes (Addendum)
[facility] [place]  Subjective  -She feels like she can't breath.  HPI:  80 y/o had elective AKA for gangrene/ischemic limb on 01/11/2014.  She was then sent to rehab on 01/14/2014 at [facility].  The right medial incision was draining and there were daily dressing changes.  The family states the wound is becoming redder and draining more.  She was admitted on Levofloxin.  Chest x-ray showed bilateral basal pneumonia and she was admitted for treatment.  We will follow up on her right AKA incision.    No current facility-administered medications on file prior to encounter.   Current Outpatient Prescriptions on File Prior to Encounter  Medication Sig Dispense Refill  . acetaminophen (TYLENOL) 325 MG tablet Take 650 mg by mouth every 4 (four) hours as needed. For pain.      Marland Kitchen allopurinol (ZYLOPRIM) 100 MG tablet Take 1 tablet (100 mg total) by mouth every other day.  30 tablet  1  . ALPRAZolam (XANAX) 0.25 MG tablet Take 1 tablet (0.25 mg total) by mouth 2 (two) times daily.  60 tablet  1  . calcitRIOL (ROCALTROL) 0.5 MCG capsule Take 1 capsule (0.5 mcg total) by mouth every other day.  30 capsule  1  . epoetin alfa (PROCRIT) 10258 UNIT/ML injection Inject 40,000 Units into the skin once a week.       Marland Kitchen HYDROcodone-acetaminophen (NORCO/VICODIN) 5-325 MG per tablet Take 1-2 tablets by mouth every 6 (six) hours as needed for moderate pain.  60 tablet  0  . levothyroxine (SYNTHROID, LEVOTHROID) 75 MCG tablet Take 1 tablet (75 mcg total) by mouth daily before breakfast.  30 tablet  1  . losartan (COZAAR) 25 MG tablet Take 1 tablet (25 mg total) by mouth daily.  30 tablet  1  . metolazone (ZAROXOLYN) 2.5 MG tablet Take 1 tablet (2.5 mg total) by mouth every other day.  30 tablet  1  . oseltamivir (TAMIFLU) 30 MG capsule Take 1 capsule (30 mg total) by mouth daily. For 2 more days  2 capsule  0  . pantoprazole (PROTONIX) 40 MG tablet Take 1 tablet (40 mg total) by mouth daily.  30 tablet   1  . polyethylene glycol (MIRALAX / GLYCOLAX) packet Take 17 g by mouth daily.  14 each  0  . potassium chloride SA (K-DUR,KLOR-CON) 20 MEQ tablet Take 1 tablet (20 mEq total) by mouth daily.  30 tablet  1  . simethicone (MYLICON) 80 MG chewable tablet Chew 1 tablet (80 mg total) by mouth 4 (four) times daily as needed for flatulence (gas).  30 tablet  0  . torsemide (DEMADEX) 20 MG tablet Take 7 tablets (140 mg total) by mouth daily.  30 tablet  1  . vitamin C (ASCORBIC ACID) 500 MG tablet Take 500 mg by mouth daily.          Objective 87/32 61 98 F (36.7 C) (Oral) 22 100%  Intake/Output Summary (Last 24 hours) at 02/08/14 1523 Last data filed at 02/08/14 1009  Gross per 24 hour  Intake      8 ml  Output    100 ml  Net    -92 ml    Right AKA incision site medially has erythema and firmness to touch.  Min.purulent fluid expressible to pressure. Lateral incision has healed well  Assessment/Planning:  S/P AKA right 01/11/2014 Incision infection , WBC 8.1 and Afebrile.  We will likely remove the staples at this time and perform wet  to dry dressing changes to the incision.  Pending exam per Dr. Bridgett Larsson.   Laurence Slate [facility] 02/08/2014 3:23 PM --  Laboratory Lab Results:  Recent Labs  02/07/14 1726  WBC 8.1  HGB 8.7*  HCT 29.1*  PLT 144*   BMET  Recent Labs  02/07/14 1726  NA 146  K 4.5  CL 106  CO2 22  GLUCOSE 90  BUN 89*  CREATININE 2.98*  CALCIUM 9.2    COAG Lab Results  Component Value Date   INR 1.43 10/20/2010   INR 1.96* 10/19/2010   INR 2.9 10/18/2010   PROTIME 25.3 05/30/2009   No results found for this basename: PTT   Addendum  I have independently interviewed and examined the patient, and I agree with the physician assistant's findings except I could not express any purulence.  Pt has some dead skin medial and some exposed ischemic appearing fat.  As her entire thigh was essential fat with minimal muscle,  I'm not too surprise with  difficulty healing as fat has limited blood supply.  She had enough bleeding in my opinion to heal the AKA though.  Pt is already on antibiotic.  I would go ahead and remove every other staple, with the plan to remove the remain next week.  I agreed with wet-to-dry dressing to the exposed wound at this point.  If the wound opens up, a wound VAC might be another option.  Will continue to follow with you.  Adele Barthel, MD [facility] [place] Office: [phone] Pager: [phone]  02/08/2014, 5:18 PM

## 2014-02-08 NOTE — Consult Note (Signed)
HPI: I was asked by Dr. Dyann Kief to see [name] who is a 80 y.o. female admitted yesterday after R AKA 2/2, rehab stay and then recent hospitalization for a respiratory illness with fever, flu A and CXR showing bibailar a/s dz and congestion.  Pt discharged on 2/27.  Pt readmittd  3/1 with c/o decreased UOP, malaise, anorexia, orthopnea.  Renal is asked to see due to worsening renal fct with BUN of 89 and creat of 2.98.  Results for KNOX, HOLDMAN (MRN [id number]) as of 02/08/2014 15:08  Ref. Range 01/18/2014 07:15 01/20/2014 05:25 01/20/2014 14:40 01/21/2014 06:00 02/01/2014 17:10 02/02/2014 06:00 02/04/2014 04:55 02/07/2014 17:26  Creatinine Latest Range: 0.50-1.10 mg/dL 2.40 (H) 2.48 (H) 2.51 (H) 2.55 (H) 2.44 (H) 2.06 (H) 1.94 (H) 2.98 (H)    She has a mature LUE AVF.    Past Medical History  Diagnosis Date  . Restrictive cardiomyopathy   . Atrial fibrillation 1996    S/P cardioversion  . CVA (cerebral infarction) 1999  . CPAP (continuous positive airway pressure) dependence   . Anemia     NOS  . Iron deficiency   . Gout   . Peptic ulcer, acute with hemorrhage 2005, 2011  . Hypertension   . Hypothyroidism   . Diastolic dysfunction   . Adenomatous colon polyp   . GERD (gastroesophageal reflux disease)   . Hiatal hernia   . Diverticulosis   . Anxiety disorder   . Arthritis   . Internal and external hemorrhoids without complication   . Morbid obesity   . CAD (coronary artery disease) 08/06/2008    One vessel by cath, EF overall preserved by echo 04/30/2008  . On home oxygen therapy   . Renal failure     Dr. Hassell Done  . Hyperlipidemia   . CHF (congestive heart failure)   . Complication of anesthesia     pt states she was sore "all-over" for over a week after anesthesia  . COAD (chronic obstructive airways disease) 2007    Exacerbation  . COPD (chronic obstructive pulmonary disease)     oxygen prn  . Sleep apnea     cpap - no longer uses  . Stroke     x2 -- effected  vision; left sided slightly weak  . Rash     on upper extremities  . Bowel trouble     having constipation and diarrhea  . Family history of anesthesia complication     daughter had same "sore all over" experience with anesthesia  . AVM (arteriovenous malformation) of small bowel, acquired     2012 capsule endoscopy   Past Surgical History  Procedure Laterality Date  . Appendectomy    . Oophorectomy    . Incision and drainage of cellulitis  27/7824    Umbilical abcess  . Cardiac catheterization      Pulmonary HTN, non obstructive CAD  . Mole removal      on leg  . Vaginal hysterectomy    . Colonoscopy    . Bascilic vein transposition Left     Left upper arm  . Amputation Right 01/11/2014    Procedure: AMPUTATION ABOVE KNEE-RIGHT;  Surgeon: Conrad Kimble, MD;  Location: [place];  Service: Vascular;  Laterality: Right;  . Esophagogastroduodenoscopy    . Givens capsule study     Social History:  reports that she has never smoked. She has never used smokeless tobacco. She reports that she does not drink alcohol or use illicit drugs. Allergies:  Allergies  Allergen Reactions  . Adhesive [Tape]   . Aspirin Other (See Comments)    Tendency to bleed easily   Family History  Problem Relation Age of Onset  . Breast cancer Mother   . Cancer Mother     breast  . Heart disease Father     MI  . Heart attack Father   . Breast cancer Sister   . Cancer Sister     breast  . Peripheral vascular disease Sister   . Heart disease Brother     CABG 5 vessel  . Rheum arthritis Brother   . Colon cancer Neg Hx     Medications:  Scheduled: . albuterol  2.5 mg Nebulization Q6H  . allopurinol  100 mg Oral QODAY  . ALPRAZolam  0.25 mg Oral BID  . amoxicillin-clavulanate  1 tablet Oral BID  . [START ON 02/09/2014] antiseptic oral rinse  15 mL Mouth Rinse BID  . aspirin EC  81 mg Oral Daily  . calcitRIOL  0.5 mcg Oral QODAY  . carvedilol  3.125 mg Oral BID WC  . [START ON 02/12/2014]  darbepoetin  25 mcg Subcutaneous Q7 days  . feeding supplement (RESOURCE BREEZE)  1 Container Oral TID BM  . heparin  5,000 Units Subcutaneous 3 times per day  . levothyroxine  75 mcg Oral QAC breakfast  . pantoprazole  40 mg Oral Daily  . polyethylene glycol  17 g Oral Daily  . sodium chloride  3 mL Intravenous Q12H  . sodium chloride  3 mL Intravenous Q12H    ROS: difficult to ascertain any hx from pt due to acuity of illness and her lack of engagement  Blood pressure 87/32, pulse 61, temperature 98 F (36.7 C), temperature source Oral, resp. rate 22, height _0  (1.448 m), weight 46.04 kg (101 lb 8 oz), SpO2 99.00%.  General appearance: slowed mentation Head: Normocephalic, without obvious abnormality, atraumatic Eyes: negative Ears: normal TM's and external ear canals both ears Nose: Nares normal. Septum midline. Mucosa normal. No drainage or sinus tenderness. Throat: lips, mucosa, and tongue normal; teeth and gums normal Resp: diminished breath sounds bibasilar Chest wall: no tenderness Cardio: regular rate and rhythm, S1, S2 normal, no murmur, click, rub or gallop GI: soft, non-tender; bowel sounds normal; no masses,  no organomegaly Extremities: edema 1+, L AKA and left arm AVF Skin: Skin color, texture, turgor normal. No rashes or lesions Neurologic: Mental status: Alert, oriented, thought content appropriate, lethargic Results for orders placed during the hospital encounter of 02/07/14 (from the past 48 hour(s))  CBC WITH DIFFERENTIAL     Status: Abnormal   Collection Time    02/07/14  5:26 PM      Result Value Ref Range   WBC 8.1  4.0 - 10.5 K/uL   RBC 2.72 (*) 3.87 - 5.11 MIL/uL   Hemoglobin 8.7 (*) 12.0 - 15.0 g/dL   HCT 29.1 (*) 36.0 - 46.0 %   MCV 107.0 (*) 78.0 - 100.0 fL   MCH 32.0  26.0 - 34.0 pg   MCHC 29.9 (*) 30.0 - 36.0 g/dL   RDW 17.6 (*) 11.5 - 15.5 %   Platelets 144 (*) 150 - 400 K/uL   Neutrophils Relative % 80 (*) 43 - 77 %   Neutro Abs 6.5  1.7 -  7.7 K/uL   Lymphocytes Relative 11 (*) 12 - 46 %   Lymphs Abs 0.9  0.7 - 4.0 K/uL   Monocytes Relative 7  3 -  12 %   Monocytes Absolute 0.6  0.1 - 1.0 K/uL   Eosinophils Relative 2  0 - 5 %   Eosinophils Absolute 0.1  0.0 - 0.7 K/uL   Basophils Relative 0  0 - 1 %   Basophils Absolute 0.0  0.0 - 0.1 K/uL  BASIC METABOLIC PANEL     Status: Abnormal   Collection Time    02/07/14  5:26 PM      Result Value Ref Range   Sodium 146  137 - 147 mEq/L   Potassium 4.5  3.7 - 5.3 mEq/L   Chloride 106  96 - 112 mEq/L   CO2 22  19 - 32 mEq/L   Glucose, Bld 90  70 - 99 mg/dL   BUN 89 (*) 6 - 23 mg/dL   Creatinine, Ser 2.98 (*) 0.50 - 1.10 mg/dL   Calcium 9.2  8.4 - 10.5 mg/dL   GFR calc non Af Amer 14 (*) >90 mL/min   GFR calc Af Amer 16 (*) >90 mL/min   Comment: (NOTE)     The eGFR has been calculated using the CKD EPI equation.     This calculation has not been validated in all clinical situations.     eGFR's persistently <90 mL/min signify possible Chronic Kidney     Disease.  Randolm Idol, ED     Status: Abnormal   Collection Time    02/07/14  6:14 PM      Result Value Ref Range   Troponin i, poc 0.10 (*) 0.00 - 0.08 ng/mL   Comment NOTIFIED PHYSICIAN     Comment 3            Comment: Due to the release kinetics of cTnI,     a negative result within the first hours     of the onset of symptoms does not rule out     myocardial infarction with certainty.     If myocardial infarction is still suspected,     repeat the test at appropriate intervals.  I-STAT CG4 LACTIC ACID, ED     Status: None   Collection Time    02/07/14  8:15 PM      Result Value Ref Range   Lactic Acid, Venous 0.80  0.5 - 2.2 mmol/L  URINALYSIS, ROUTINE W REFLEX MICROSCOPIC     Status: Abnormal   Collection Time    02/07/14  8:42 PM      Result Value Ref Range   Color, Urine YELLOW  YELLOW   APPearance CLEAR  CLEAR   Specific Gravity, Urine 1.020  1.005 - 1.030   pH 5.5  5.0 - 8.0   Glucose, UA  NEGATIVE  NEGATIVE mg/dL   Hgb urine dipstick TRACE (*) NEGATIVE   Bilirubin Urine NEGATIVE  NEGATIVE   Ketones, ur NEGATIVE  NEGATIVE mg/dL   Protein, ur 30 (*) NEGATIVE mg/dL   Urobilinogen, UA 0.2  0.0 - 1.0 mg/dL   Nitrite NEGATIVE  NEGATIVE   Leukocytes, UA SMALL (*) NEGATIVE  URINE MICROSCOPIC-ADD ON     Status: None   Collection Time    02/07/14  8:42 PM      Result Value Ref Range   Squamous Epithelial / LPF RARE  RARE   WBC, UA 3-6  <3 WBC/hpf   Urine-Other AMORPHOUS URATES/PHOSPHATES     Comment: MICROSCOPIC EXAM PERFORMED ON UNCONCENTRATED URINE  STREP PNEUMONIAE URINARY ANTIGEN     Status: None   Collection Time    02/08/14  12:25 AM      Result Value Ref Range   Strep Pneumo Urinary Antigen NEGATIVE  NEGATIVE   Comment:            Infection due to S. pneumoniae     cannot be absolutely ruled out     since the antigen present     may be below the detection limit     of the test.  LEGIONELLA ANTIGEN, URINE     Status: None   Collection Time    02/08/14 12:25 AM      Result Value Ref Range   Specimen Description URINE, RANDOM     Special Requests NONE     Legionella Antigen, Urine       Value: Negative for Legionella pneumophilia serogroup 1     Performed at [facility]   Report Status 02/08/2014 FINAL    TROPONIN I     Status: None   Collection Time    02/08/14  1:16 AM      Result Value Ref Range   Troponin I <0.30  <0.30 ng/mL   Comment:            Due to the release kinetics of cTnI,     a negative result within the first hours     of the onset of symptoms does not rule out     myocardial infarction with certainty.     If myocardial infarction is still suspected,     repeat the test at appropriate intervals.  TROPONIN I     Status: None   Collection Time    02/08/14  3:50 AM      Result Value Ref Range   Troponin I <0.30  <0.30 ng/mL   Comment:            Due to the release kinetics of cTnI,     a negative result within the first hours      of the onset of symptoms does not rule out     myocardial infarction with certainty.     If myocardial infarction is still suspected,     repeat the test at appropriate intervals.  TROPONIN I     Status: None   Collection Time    02/08/14 10:23 AM      Result Value Ref Range   Troponin I <0.30  <0.30 ng/mL   Comment:            Due to the release kinetics of cTnI,     a negative result within the first hours     of the onset of symptoms does not rule out     myocardial infarction with certainty.     If myocardial infarction is still suspected,     repeat the test at appropriate intervals.   Dg Chest 2 View  02/07/2014   CLINICAL DATA:  Chest pain and shortness of breath. Hypertension/ CHF.  EXAM: CHEST  2 VIEW  COMPARISON:  02/01/2014 and 01/11/2014  FINDINGS: Patient is rotated to the right. Exam demonstrates somewhat low lung volumes with new moderate opacification over the right mid to lower lung likely moderate size effusion with atelectasis. There is a small left effusion likely with associated atelectasis. Cannot exclude infection in the lung bases. There is moderate stable cardiomegaly. There is calcified plaque over the thoracoabdominal aorta. Remainder of the exam is unchanged.  IMPRESSION: Moderate opacification over the right mid to lower lung compatible with a moderate confusion likely with  associated atelectasis. Small left effusion. Cannot exclude infection in the lung bases.  Moderate stable cardiomegaly.   Electronically Signed   By: Marin Olp M.D.   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

## 2014-02-09 DIAGNOSIS — E43 Unspecified severe protein-calorie malnutrition: Secondary | ICD-10-CM | POA: Diagnosis present

## 2014-02-09 DIAGNOSIS — N186 End stage renal disease: Secondary | ICD-10-CM

## 2014-02-09 DIAGNOSIS — J9620 Acute and chronic respiratory failure, unspecified whether with hypoxia or hypercapnia: Secondary | ICD-10-CM

## 2014-02-09 LAB — URINE CULTURE
Colony Count: NO GROWTH
Culture: NO GROWTH

## 2014-02-09 LAB — RENAL FUNCTION PANEL
Albumin: 2.1 g/dL — ABNORMAL LOW (ref 3.5–5.2)
BUN: 100 mg/dL — AB (ref 6–23)
CO2: 20 meq/L (ref 19–32)
Calcium: 8.7 mg/dL (ref 8.4–10.5)
Chloride: 105 mEq/L (ref 96–112)
Creatinine, Ser: 3.74 mg/dL — ABNORMAL HIGH (ref 0.50–1.10)
GFR calc non Af Amer: 10 mL/min — ABNORMAL LOW (ref >90)
GFR, EST AFRICAN AMERICAN: 12 mL/min — AB (ref >90)
GLUCOSE: 98 mg/dL (ref 70–99)
POTASSIUM: 4.7 meq/L (ref 3.7–5.3)
Phosphorus: 7.2 mg/dL — ABNORMAL HIGH (ref 2.3–4.6)
Sodium: 144 mEq/L (ref 137–147)

## 2014-02-09 LAB — HEPATITIS B SURFACE ANTIGEN: HEP B S AG: NEGATIVE

## 2014-02-09 LAB — HEPATITIS B SURFACE ANTIBODY,QUALITATIVE: Hep B S Ab: NEGATIVE

## 2014-02-09 LAB — HEPATITIS B CORE ANTIBODY, TOTAL: HEP B C TOTAL AB: NONREACTIVE

## 2014-02-09 MED ORDER — SODIUM CHLORIDE 0.9 % IV SOLN: 100.0000 mL | INTRAVENOUS | Status: DC | PRN

## 2014-02-09 MED ORDER — ALTEPLASE 2 MG IJ SOLR
2.0000 mg | Freq: Once | INTRAMUSCULAR | Status: DC | PRN
  Filled 2014-02-09: qty 2

## 2014-02-09 MED ORDER — HEPARIN SODIUM (PORCINE) 1000 UNIT/ML DIALYSIS: 20.0000 [IU]/kg | INTRAMUSCULAR | Status: DC | PRN

## 2014-02-09 MED ORDER — MORPHINE SULFATE 2 MG/ML IJ SOLN
2.0000 mg | Freq: Four times a day (QID) | INTRAMUSCULAR | Status: DC | PRN
  Administered 2014-02-09 – 2014-02-10 (×4): 2 mg via INTRAVENOUS
  Filled 2014-02-09 (×5): qty 1

## 2014-02-09 MED ORDER — CLONAZEPAM 0.5 MG PO TABS
0.5000 mg | ORAL_TABLET | Freq: Two times a day (BID) | ORAL | Status: DC | PRN
  Administered 2014-02-09 – 2014-02-10 (×4): 0.5 mg via ORAL
  Filled 2014-02-09 (×4): qty 1

## 2014-02-09 MED ORDER — PENTAFLUOROPROP-TETRAFLUOROETH EX AERO: 1.0000 "application " | INHALATION_SPRAY | CUTANEOUS | Status: DC | PRN

## 2014-02-09 MED ORDER — NEPRO/CARBSTEADY PO LIQD: 237.0000 mL | ORAL | Status: DC | PRN

## 2014-02-09 MED ORDER — RENA-VITE PO TABS
1.0000 | ORAL_TABLET | Freq: Every day | ORAL | Status: DC
  Administered 2014-02-09 – 2014-02-10 (×2): 1 via ORAL
  Filled 2014-02-09 (×6): qty 1

## 2014-02-09 MED ORDER — SEVELAMER CARBONATE 800 MG PO TABS
800.0000 mg | ORAL_TABLET | Freq: Three times a day (TID) | ORAL | Status: DC
  Administered 2014-02-10 (×3): 800 mg via ORAL
  Filled 2014-02-09 (×15): qty 1

## 2014-02-09 MED ORDER — LIDOCAINE HCL (PF) 1 % IJ SOLN: 5.0000 mL | INTRAMUSCULAR | Status: DC | PRN

## 2014-02-09 MED ORDER — LIDOCAINE-PRILOCAINE 2.5-2.5 % EX CREA
1.0000 "application " | TOPICAL_CREAM | CUTANEOUS | Status: DC | PRN
  Administered 2014-02-09: 1 via TOPICAL
  Filled 2014-02-09 (×2): qty 5

## 2014-02-09 MED ORDER — AMOXICILLIN-POT CLAVULANATE 500-125 MG PO TABS
1.0000 | ORAL_TABLET | Freq: Every day | ORAL | Status: DC
  Administered 2014-02-09 – 2014-02-10 (×2): 500 mg via ORAL
  Filled 2014-02-09 (×4): qty 1

## 2014-02-09 MED ORDER — HEPARIN SODIUM (PORCINE) 1000 UNIT/ML DIALYSIS: 1000.0000 [IU] | INTRAMUSCULAR | Status: DC | PRN

## 2014-02-09 NOTE — Progress Notes (Signed)
ANTIBIOTIC CONSULT NOTE - INITIAL  Pharmacy Consult for Augmentin Indication: R/o Pneumonia   Allergies  Allergen Reactions  . Adhesive [Tape]   . Aspirin Other (See Comments)    Tendency to bleed easily    Patient Measurements: Height: 4\' 9"  (144.8 cm) Weight: 101 lb 8 oz (46.04 kg) IBW/kg (Calculated) : 38.6 Adjusted Body Weight: n/a  Vital Signs: Temp: 98.5 F (36.9 C) (03/03 0515) Temp src: Oral (03/03 0515) BP: 100/42 mmHg (03/03 0515) Pulse Rate: 62 (03/03 0515) Intake/Output from previous day: 03/02 0701 - 03/03 0700 In: 56 [P.O.:55; I.V.:3] Out: 0  Intake/Output from this shift:    Labs:  Recent Labs  02/07/14 1726 02/09/14 0333  WBC 8.1  --   HGB 8.7*  --   PLT 144*  --   CREATININE 2.98* 3.74*   Estimated Creatinine Clearance: 7.1 ml/min (by C-G formula based on Cr of 3.74). No results found for this basename: VANCOTROUGH, Corlis Leak, VANCORANDOM, [place], GENTPEAK, GENTRANDOM, TOBRATROUGH, TOBRAPEAK, TOBRARND, AMIKACINPEAK, AMIKACINTROU, AMIKACIN,  in the last 72 hours   Microbiology: Recent Results (from the past 720 hour(s))  MRSA PCR SCREENING     Status: None   Collection Time    01/11/14  7:52 PM      Result Value Ref Range Status   MRSA by PCR NEGATIVE  NEGATIVE Final   Comment:            The GeneXpert MRSA Assay (FDA     approved for NASAL specimens     only), is one component of a     comprehensive MRSA colonization     surveillance program. It is not     intended to diagnose MRSA     infection nor to guide or     monitor treatment for     MRSA infections.  CULTURE, BLOOD (ROUTINE X 2)     Status: None   Collection Time    02/01/14  7:50 PM      Result Value Ref Range Status   Specimen Description BLOOD RIGHT ARM   Final   Special Requests BOTTLES DRAWN AEROBIC AND ANAEROBIC 5ML   Final   Culture  Setup Time     Final   Value: 02/02/2014 00:57     Performed at [facility]   Culture     Final   Value: NO GROWTH 5  DAYS     Performed at [facility]   Report Status 02/08/2014 FINAL   Final  CULTURE, BLOOD (ROUTINE X 2)     Status: None   Collection Time    02/01/14  8:10 PM      Result Value Ref Range Status   Specimen Description BLOOD RIGHT FOREARM   Final   Special Requests BOTTLES DRAWN AEROBIC ONLY 2ML   Final   Culture  Setup Time     Final   Value: 02/02/2014 00:57     Performed at [facility]   Culture     Final   Value: NO GROWTH 5 DAYS     Performed at [facility]   Report Status 02/08/2014 FINAL   Final  URINE CULTURE     Status: None   Collection Time    02/01/14  8:26 PM      Result Value Ref Range Status   Specimen Description URINE, CLEAN CATCH   Final   Special Requests NONE   Final   Culture  Setup Time     Final  Value: 02/02/2014 01:10     Performed at [place]     Final   Value: >=100,000 COLONIES/ML     Performed at [facility]   Culture     Final   Value: PROTEUS MIRABILIS     Performed at [facility]   Report Status 02/04/2014 FINAL   Final   Organism ID, Bacteria PROTEUS MIRABILIS   Final  MRSA PCR SCREENING     Status: None   Collection Time    02/02/14 10:16 AM      Result Value Ref Range Status   MRSA by PCR NEGATIVE  NEGATIVE Final   Comment:            The GeneXpert MRSA Assay (FDA     approved for NASAL specimens     only), is one component of a     comprehensive MRSA colonization     surveillance program. It is not     intended to diagnose MRSA     infection nor to guide or     monitor treatment for     MRSA infections.  CLOSTRIDIUM DIFFICILE BY PCR     Status: None   Collection Time    02/02/14  7:09 PM      Result Value Ref Range Status   C difficile by pcr NEGATIVE  NEGATIVE Final   Comment: Performed at [place], EXPECTORATED SPUTUM-ASSESSMENT     Status: None   Collection Time    02/03/14 11:34 AM      Result Value Ref Range Status   Specimen  Description SPUTUM   Final   Special Requests NONE   Final   Sputum evaluation     Final   Value: THIS SPECIMEN IS ACCEPTABLE. RESPIRATORY CULTURE REPORT TO FOLLOW.   Report Status 02/03/2014 FINAL   Final  CULTURE, RESPIRATORY (NON-EXPECTORATED)     Status: None   Collection Time    02/03/14 11:34 AM      Result Value Ref Range Status   Specimen Description SPUTUM   Final   Special Requests NONE   Final   Gram Stain     Final   Value: NO WBC SEEN     NO SQUAMOUS EPITHELIAL CELLS SEEN     NO ORGANISMS SEEN     Performed at [facility]   Culture     Final   Value: NORMAL OROPHARYNGEAL FLORA     Performed at [facility]   Report Status 02/05/2014 FINAL   Final  CULTURE, BLOOD (ROUTINE X 2)     Status: None   Collection Time    02/07/14  8:10 PM      Result Value Ref Range Status   Specimen Description BLOOD RIGHT FOREARM   Final   Special Requests BOTTLES DRAWN AEROBIC AND ANAEROBIC 10CC EA   Final   Culture  Setup Time     Final   Value: 02/08/2014 03:29     Performed at [facility]   Culture     Final   Value:        BLOOD CULTURE RECEIVED NO GROWTH TO DATE CULTURE WILL BE HELD FOR 5 DAYS BEFORE ISSUING A FINAL NEGATIVE REPORT     Performed at [facility]   Report Status PENDING   Incomplete  CULTURE, BLOOD (ROUTINE X 2)     Status: None   Collection Time    02/07/14  8:13 PM      Result Value Ref Range Status   Specimen Description BLOOD RIGHT ANTECUBITAL   Final   Special Requests BOTTLES DRAWN AEROBIC AND ANAEROBIC 10CC EA   Final   Culture  Setup Time     Final   Value: 02/08/2014 03:29     Performed at [facility]   Culture     Final   Value:        BLOOD CULTURE RECEIVED NO GROWTH TO DATE CULTURE WILL BE HELD FOR 5 DAYS BEFORE ISSUING A FINAL NEGATIVE REPORT     Performed at [facility]   Report Status PENDING   Incomplete  URINE CULTURE     Status: None   Collection Time    02/07/14  8:42 PM      Result  Value Ref Range Status   Specimen Description URINE, CATHETERIZED   Final   Special Requests NONE   Final   Culture  Setup Time     Final   Value: 02/08/2014 04:04     Performed at [place]     Final   Value: NO GROWTH     Performed at [facility]   Culture     Final   Value: NO GROWTH     Performed at [facility]   Report Status 02/09/2014 FINAL   Final    Medical History: Past Medical History  Diagnosis Date  . Restrictive cardiomyopathy   . Atrial fibrillation 1996    S/P cardioversion  . CVA (cerebral infarction) 1999  . CPAP (continuous positive airway pressure) dependence   . Anemia     NOS  . Iron deficiency   . Gout   . Peptic ulcer, acute with hemorrhage 2005, 2011  . Hypertension   . Hypothyroidism   . Diastolic dysfunction   . Adenomatous colon polyp   . GERD (gastroesophageal reflux disease)   . Hiatal hernia   . Diverticulosis   . Anxiety disorder   . Arthritis   . Internal and external hemorrhoids without complication   . Morbid obesity   . CAD (coronary artery disease) 08/06/2008    One vessel by cath, EF overall preserved by echo 04/30/2008  . On home oxygen therapy   . Renal failure     Dr. Hassell Done  . Hyperlipidemia   . CHF (congestive heart failure)   . Complication of anesthesia     pt states she was sore "all-over" for over a week after anesthesia  . COAD (chronic obstructive airways disease) 2007    Exacerbation  . COPD (chronic obstructive pulmonary disease)     oxygen prn  . Sleep apnea     cpap - no longer uses  . Stroke     x2 -- effected vision; left sided slightly weak  . Rash     on upper extremities  . Bowel trouble     having constipation and diarrhea  . Family history of anesthesia complication     daughter had same "sore all over" experience with anesthesia  . AVM (arteriovenous malformation) of small bowel, acquired     2012 capsule endoscopy    Medications:  Prescriptions prior  to admission  Medication Sig Dispense Refill  . acetaminophen (TYLENOL) 325 MG tablet Take 650 mg by mouth every 4 (four) hours as needed. For pain.      Marland Kitchen allopurinol (ZYLOPRIM) 100 MG tablet Take 1 tablet (100 mg total)  by mouth every other day.  30 tablet  1  . ALPRAZolam (XANAX) 0.25 MG tablet Take 1 tablet (0.25 mg total) by mouth 2 (two) times daily.  60 tablet  1  . calcitRIOL (ROCALTROL) 0.5 MCG capsule Take 1 capsule (0.5 mcg total) by mouth every other day.  30 capsule  1  . epoetin alfa (PROCRIT) 16109 UNIT/ML injection Inject 40,000 Units into the skin once a week.       Marland Kitchen HYDROcodone-acetaminophen (NORCO/VICODIN) 5-325 MG per tablet Take 1-2 tablets by mouth every 6 (six) hours as needed for moderate pain.  60 tablet  0  . levothyroxine (SYNTHROID, LEVOTHROID) 75 MCG tablet Take 1 tablet (75 mcg total) by mouth daily before breakfast.  30 tablet  1  . losartan (COZAAR) 25 MG tablet Take 1 tablet (25 mg total) by mouth daily.  30 tablet  1  . metolazone (ZAROXOLYN) 2.5 MG tablet Take 1 tablet (2.5 mg total) by mouth every other day.  30 tablet  1  . oseltamivir (TAMIFLU) 30 MG capsule Take 1 capsule (30 mg total) by mouth daily. For 2 more days  2 capsule  0  . pantoprazole (PROTONIX) 40 MG tablet Take 1 tablet (40 mg total) by mouth daily.  30 tablet  1  . polyethylene glycol (MIRALAX / GLYCOLAX) packet Take 17 g by mouth daily.  14 each  0  . potassium chloride SA (K-DUR,KLOR-CON) 20 MEQ tablet Take 1 tablet (20 mEq total) by mouth daily.  30 tablet  1  . simethicone (MYLICON) 80 MG chewable tablet Chew 1 tablet (80 mg total) by mouth 4 (four) times daily as needed for flatulence (gas).  30 tablet  0  . torsemide (DEMADEX) 20 MG tablet Take 7 tablets (140 mg total) by mouth daily.  30 tablet  1  . vitamin C (ASCORBIC ACID) 500 MG tablet Take 500 mg by mouth daily.        Marland Kitchen levofloxacin (LEVAQUIN) 500 MG tablet Take 500 mg by mouth daily. For 6 more days. Finished course of medication  on 02-06-14       Assessment: 21 YOF with acute on chronic respiratory failure being switched from Vancomycin+Cefepime to Augmentin for r/o pneumonia. Per hospitalist, treat for 4 more days till 3/7. Patient with declining renal fx and probable uremic syndrome per nephrology to start HD today. WBC wnl. She has already received one dose of Augmentin today.   3/1 Vanc>>3/2 3/1 Cefepime>>3/2 Augmentin 3/2>> 3/7  3/1 Blood Cx x2 >> 3/1 Urine Cx>>neg    Goal of Therapy:  Eradication of infection   Plan:  1) Augmentin 500 mg daily after HD (Additional 500 mg dose with HD on HD days) x 4 days  2) Monitor renal function closely 3) F/u on culture and sensitivity and check vancomycin trough when it's appropriate. 4) F/u HD schedule while in-patient.   Albertina Parr, PharmD.  Clinical Pharmacist Pager [phone]

## 2014-02-09 NOTE — Progress Notes (Signed)
Bladder scanned patient and result- 111. Patient has not voided all night and has not taken in many fluids except with medication. Notified hospitalist on-call. Will continue to monitor to end of shift.

## 2014-02-09 NOTE — Progress Notes (Addendum)
[facility] PROGRESS NOTE  [name] KWI:[id number] DOB: 16-Jan-1929 DOA: 02/07/2014 PCP: Unice Cobble, MD  Assessment/Plan: 1-Acute on chronic resp failure: appears to be secondary to CHF exacerbation. Doubt PNA component to be the cause of culprit. -renal has been consulted and will follow rec's -failed aggressive diuretics treatment as indicated by renal -next step if for HD trial. 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 Assumed initial elevated troponin due to renal failure. -will continue low dose b-blocker -no need for cardiology consult at this point -volume to be controlled with HD  4-restrictive cardiomyopathy and diastolic heart failure:  -continue strict I's and O's;  -patient has failed diuretics treatment -per renal service next step will be HD trial -continue low dose b-blocker  5-PAD: s/p right AKA -vascular surgery consulted -will follow rec's  6-decubitus ulcer: will continue prevention measures. -will ordered overlay mattress -wound care consulted, will follow rec's  7-anxiety and pain: will change xanax to klonopin and adjust morphine orders.  -will follow clinical response  8-severe protein calorie malnutrition: will follow dietician recommendations -diet advance to full liquid and if tolerated will advance to regular.  DVT: heparin  Code Status: Full Family Communication: granddaughter at bedside  Disposition Plan: to be determine; will most likely need SNF   Consultants:  Vascular surgery  Renal service   Procedures:  See below for x-ray reports  Antibiotics:  augmentin  3/02  HPI/Subjective: No fever, denies CP. Mild JVD appreciated and sounds congested. Despite aggressive diuresis no significant urine output in last 24 hours (Urine output < 50 CC; bladder scan with 100cc)  Objective: Filed Vitals:   02/09/14 0515  BP: 100/42  Pulse: 62  Temp: 98.5 F (36.9 C)  Resp: 18    Intake/Output Summary (Last 24 hours) at 02/09/14 1010 Last data filed at 02/09/14 0030  Gross per 24 hour  Intake     50 ml  Output      0 ml  Net     50 ml   Filed Weights   02/07/14 2218 02/07/14 2328 02/08/14 0500  Weight: 43.999 kg (97 lb) 46.04 kg (101 lb 8 oz) 46.04 kg (101 lb 8 oz)    Exam:   General:  Anxious and uncomfortable, reports significant pain on her buttocks . Family reports she is a little confused.  Cardiovascular: regular rate, no rubs or gallops, mild JVD  Respiratory: positive crackles, decrease BS and positive rhonchi, no wheezing  Abdomen: soft, NT, NS, positive BS  Musculoskeletal: right AKA (wound with staple in place, mild erythema around staples but not frank infection; inner side with sero-sanguineous drainage)  Data Reviewed: Basic Metabolic Panel:  Recent Labs Lab 02/04/14 0455 02/07/14 1726 02/09/14 0333  NA 144 146 144  K 3.1* 4.5 4.7  CL 106 106 105  CO2 21 22 20   GLUCOSE 101* 90 98  BUN 86* 89* 100*  CREATININE 1.94* 2.98* 3.74*  CALCIUM 8.6 9.2 8.7  PHOS  --   --  7.2*   Liver Function Tests:  Recent Labs Lab 02/09/14 0333  ALBUMIN 2.1*   CBC:  Recent Labs Lab 02/04/14 0455 02/07/14 1726  WBC 6.7 8.1  NEUTROABS  --  6.5  HGB 8.3* 8.7*  HCT 27.0* 29.1*  MCV 105.1* 107.0*  PLT 112* 144*   Cardiac Enzymes:  Recent Labs Lab 02/02/14 1255 02/08/14 0116 02/08/14 0350 02/08/14 1023  TROPONINI <0.30 <0.30 <0.30 <0.30   BNP (last 3 results)  Recent Labs  02/09/13 1146 02/02/14 0138  PROBNP 222.0* 15475.0*   CBG: No results found for this basename: GLUCAP,  in the last 168 hours  Recent  Results (from the past 240 hour(s))  CULTURE, BLOOD (ROUTINE X 2)     Status: None   Collection Time    02/01/14  7:50 PM      Result Value Ref Range Status   Specimen Description BLOOD RIGHT ARM   Final   Special Requests BOTTLES DRAWN AEROBIC AND ANAEROBIC 5ML   Final   Culture  Setup Time     Final   Value: 02/02/2014 00:57     Performed at [facility]   Culture     Final   Value: NO GROWTH 5 DAYS     Performed at [facility]   Report Status 02/08/2014 FINAL   Final  CULTURE, BLOOD (ROUTINE X 2)     Status: None   Collection Time    02/01/14  8:10 PM      Result Value Ref Range Status   Specimen Description BLOOD RIGHT FOREARM   Final   Special Requests BOTTLES DRAWN AEROBIC ONLY 2ML   Final   Culture  Setup Time     Final   Value: 02/02/2014 00:57     Performed at [facility]   Culture     Final   Value: NO GROWTH 5 DAYS     Performed at [facility]   Report Status 02/08/2014 FINAL   Final  URINE CULTURE     Status: None   Collection Time    02/01/14  8:26 PM      Result Value Ref Range Status   Specimen Description URINE, CLEAN CATCH   Final   Special Requests NONE   Final   Culture  Setup Time     Final   Value: 02/02/2014 01:10     Performed at [place]     Final   Value: >=100,000 COLONIES/ML     Performed at [facility]   Culture     Final   Value: PROTEUS MIRABILIS     Performed at [facility]   Report Status 02/04/2014 FINAL   Final   Organism ID, Bacteria PROTEUS MIRABILIS   Final  MRSA PCR SCREENING     Status: None   Collection Time    02/02/14 10:16 AM      Result Value Ref Range Status   MRSA by PCR NEGATIVE  NEGATIVE Final   Comment:            The GeneXpert MRSA Assay (FDA     approved for NASAL specimens     only), is one component of a     comprehensive MRSA colonization     surveillance program. It is not     intended to diagnose MRSA     infection nor to guide  or     monitor treatment for     MRSA infections.  CLOSTRIDIUM DIFFICILE BY PCR     Status: None   Collection Time    02/02/14  7:09 PM      Result Value Ref Range  Status   C difficile by pcr NEGATIVE  NEGATIVE Final   Comment: Performed at [place], EXPECTORATED SPUTUM-ASSESSMENT     Status: None   Collection Time    02/03/14 11:34 AM      Result Value Ref Range Status   Specimen Description SPUTUM   Final   Special Requests NONE   Final   Sputum evaluation     Final   Value: THIS SPECIMEN IS ACCEPTABLE. RESPIRATORY CULTURE REPORT TO FOLLOW.   Report Status 02/03/2014 FINAL   Final  CULTURE, RESPIRATORY (NON-EXPECTORATED)     Status: None   Collection Time    02/03/14 11:34 AM      Result Value Ref Range Status   Specimen Description SPUTUM   Final   Special Requests NONE   Final   Gram Stain     Final   Value: NO WBC SEEN     NO SQUAMOUS EPITHELIAL CELLS SEEN     NO ORGANISMS SEEN     Performed at [facility]   Culture     Final   Value: NORMAL OROPHARYNGEAL FLORA     Performed at [facility]   Report Status 02/05/2014 FINAL   Final  CULTURE, BLOOD (ROUTINE X 2)     Status: None   Collection Time    02/07/14  8:10 PM      Result Value Ref Range Status   Specimen Description BLOOD RIGHT FOREARM   Final   Special Requests BOTTLES DRAWN AEROBIC AND ANAEROBIC 10CC EA   Final   Culture  Setup Time     Final   Value: 02/08/2014 03:29     Performed at [facility]   Culture     Final   Value:        BLOOD CULTURE RECEIVED NO GROWTH TO DATE CULTURE WILL BE HELD FOR 5 DAYS BEFORE ISSUING A FINAL NEGATIVE REPORT     Performed at [facility]   Report Status PENDING   Incomplete  CULTURE, BLOOD (ROUTINE X 2)     Status: None   Collection Time    02/07/14  8:13 PM      Result Value Ref Range Status   Specimen Description BLOOD RIGHT ANTECUBITAL   Final   Special Requests BOTTLES DRAWN AEROBIC AND ANAEROBIC 10CC EA   Final    Culture  Setup Time     Final   Value: 02/08/2014 03:29     Performed at [facility]   Culture     Final   Value:        BLOOD CULTURE RECEIVED NO GROWTH TO DATE CULTURE WILL BE HELD FOR 5 DAYS BEFORE ISSUING A FINAL NEGATIVE REPORT     Performed at [facility]   Report Status PENDING   Incomplete  URINE CULTURE     Status: None   Collection Time    02/07/14  8:42 PM      Result Value Ref Range Status   Specimen Description URINE, CATHETERIZED   Final   Special Requests NONE   Final   Culture  Setup Time     Final   Value: 02/08/2014 04:04     Performed at [place]     Final   Value: NO GROWTH     Performed at [facility]   Culture     Final   Value: NO GROWTH  Performed at [facility]   Report Status 02/09/2014 FINAL   Final     Studies: Dg Chest 2 View  02/07/2014   CLINICAL DATA:  Chest pain and shortness of breath. Hypertension/ CHF.  EXAM: CHEST  2 VIEW  COMPARISON:  02/01/2014 and 01/11/2014  FINDINGS: Patient is rotated to the right. Exam demonstrates somewhat low lung volumes with new moderate opacification over the right mid to lower lung likely moderate size effusion with atelectasis. There is a small left effusion likely with associated atelectasis. Cannot exclude infection in the lung bases. There is moderate stable cardiomegaly. There is calcified plaque over the thoracoabdominal aorta. Remainder of the exam is unchanged.  IMPRESSION: Moderate opacification over the right mid to lower lung compatible with a moderate confusion likely with associated atelectasis. Small left effusion. Cannot exclude infection in the lung bases.  Moderate stable cardiomegaly.   Electronically Signed   By: Marin Olp M.D.   On: 02/07/2014 19:32    Scheduled Meds: . albuterol  2.5 mg Nebulization Q6H  . allopurinol  100 mg Oral QODAY  . amoxicillin-clavulanate  1 tablet Oral BID  . antiseptic oral rinse  15 mL Mouth Rinse  BID  . aspirin EC  81 mg Oral Daily  . calcitRIOL  0.5 mcg Oral QODAY  . carvedilol  3.125 mg Oral BID WC  . [START ON 02/12/2014] darbepoetin  25 mcg Subcutaneous Q7 days  . feeding supplement (RESOURCE BREEZE)  1 Container Oral TID BM  . furosemide  80 mg Intravenous Q6H  . heparin  5,000 Units Subcutaneous 3 times per day  . levothyroxine  75 mcg Oral QAC breakfast  . pantoprazole  40 mg Oral Daily  . polyethylene glycol  17 g Oral Daily  . sodium chloride  3 mL Intravenous Q12H  . sodium chloride  3 mL Intravenous Q12H     Time spent: >30 minutes   Alexxander Kurt  [facility] Pager [phone]. If 7PM-7AM, please contact night-coverage at [email or web address], password [facility] 02/09/2014, 10:10 AM  LOS: 2 days

## 2014-02-09 NOTE — Clinical Social Work Psychosocial (Signed)
Clinical Social Work Department BRIEF PSYCHOSOCIAL ASSESSMENT 02/09/2014  Patient:  [name], [name]     Account Number:  [id number]     Admit date:  02/07/2014  Clinical Social Worker:  Donnella Sham, [place]  Date/Time:  02/09/2014 01:50 PM  Referred by:  Physician  Date Referred:  02/09/2014 Referred for  SNF Placement   Other Referral:   Interview type:  Family Other interview type:    PSYCHOSOCIAL DATA Living Status:  HUSBAND Admitted from facility:   Level of care:   Primary support name:  [name] [phone] Primary support relationship to patient:  CHILD, ADULT Degree of support available:   Zetta Bills (Granddaughter)    CURRENT CONCERNS Current Concerns  Post-Acute Placement   Other Concerns:    SOCIAL WORK ASSESSMENT / PLAN CSW met with granddaughter at bedside. Pt was alert and oriented but appeared to be in pain. CSW asked both granddaughter and pt if the nurse has been notified and they both said yes. Granddaughter stated that she was in need of options and resources and the family was feeling overwhlemed and was wanting to better understand thier resources. Grandaughter also stated that some papers have been drawn up for HPOA, but she wasnt sure because it was awhile ago. HPOA would be pts mother [name]). CSW explained that PT was recomending SNF and explained the process. Granddaughter gave permissoin to CSW to being bed search. Granddaughter also stated that the family perfers [facility]. Granddaughter asked CSW how to apply for medicad and CSW explained the process and gave Armed forces operational officer. FL2 is on chart for MDs signature.   Assessment/plan status:  Psychosocial Support/Ongoing Assessment of Needs Other assessment/ plan:   Information/referral to community resources:   nursing facility list left at bedside along with infromation on how to apply for medicad.    PATIENT'S/FAMILY'S RESPONSE TO PLAN OF  CARE: CSW met with pt and granddaughter at bedside. Graddaughter was agreeable to SNF and very thankful for CSWs visit.        [place] Intern [phone]

## 2014-02-09 NOTE — Clinical Social Work Psychosocial (Signed)
I have reviewed and agree with CSW psychosocial assessment. Lorie Phenix. [place], [place]

## 2014-02-09 NOTE — Progress Notes (Signed)
Assessment:  1 Oliguric Stage 4/5 CKD with decompensation and probable uremic syndrome with CHF?PNA  2 s/p AVF  Plan:  1 Dialysis today    Subjective: Interval History: has complaints back pain.  Objective: Vital signs in last 24 hours: Temp:  [97.2 F (36.2 C)-98.5 F (36.9 C)] 98.5 F (36.9 C) (03/03 0515) Pulse Rate:  [56-62] 62 (03/03 0515) Resp:  [18-20] 18 (03/03 0515) BP: (87-104)/(29-42) 100/42 mmHg (03/03 0515) SpO2:  [97 %-100 %] 98 % (03/03 0857) Weight change:   Intake/Output from previous day: 03/02 0701 - 03/03 0700 In: 58 [P.O.:55; I.V.:3] Out: 0  Intake/Output this shift:    General appearance: alert, mild distress and agitated Chest wall: no tenderness, frail, thin Cardio: regular rate and rhythm, S1, S2 normal, no murmur, click, rub or gallop Extremities: AKA  AVF LUE looks good    Lab Results:  Recent Labs  02/07/14 1726  WBC 8.1  HGB 8.7*  HCT 29.1*  PLT 144*   BMET:  Recent Labs  02/07/14 1726 02/09/14 0333  NA 146 144  K 4.5 4.7  CL 106 105  CO2 22 20  GLUCOSE 90 98  BUN 89* 100*  CREATININE 2.98* 3.74*  CALCIUM 9.2 8.7   No results found for this basename: PTH,  in the last 72 hours Iron Studies: No results found for this basename: IRON, TIBC, TRANSFERRIN, FERRITIN,  in the last 72 hours Studies/Results: Dg Chest 2 View  02/07/2014   CLINICAL DATA:  Chest pain and shortness of breath. Hypertension/ CHF.  EXAM: CHEST  2 VIEW  COMPARISON:  02/01/2014 and 01/11/2014  FINDINGS: Patient is rotated to the right. Exam demonstrates somewhat low lung volumes with new moderate opacification over the right mid to lower lung likely moderate size effusion with atelectasis. There is a small left effusion likely with associated atelectasis. Cannot exclude infection in the lung bases. There is moderate stable cardiomegaly. There is calcified plaque over the thoracoabdominal aorta. Remainder of the exam is unchanged.  IMPRESSION: Moderate  opacification over the right mid to lower lung compatible with a moderate confusion likely with associated atelectasis. Small left effusion. Cannot exclude infection in the lung bases.  Moderate stable cardiomegaly.   Electronically Signed   By: Marin Olp M.D.   On: 02/07/2014 19:32   Scheduled: . albuterol  2.5 mg Nebulization Q6H  . allopurinol  100 mg Oral QODAY  . amoxicillin-clavulanate  1 tablet Oral BID  . antiseptic oral rinse  15 mL Mouth Rinse BID  . aspirin EC  81 mg Oral Daily  . calcitRIOL  0.5 mcg Oral QODAY  . carvedilol  3.125 mg Oral BID WC  . [START ON 02/12/2014] darbepoetin  25 mcg Subcutaneous Q7 days  . feeding supplement (RESOURCE BREEZE)  1 Container Oral TID BM  . furosemide  80 mg Intravenous Q6H  . heparin  5,000 Units Subcutaneous 3 times per day  . levothyroxine  75 mcg Oral QAC breakfast  . pantoprazole  40 mg Oral Daily  . polyethylene glycol  17 g Oral Daily  . sodium chloride  3 mL Intravenous Q12H  . sodium chloride  3 mL Intravenous Q12H     LOS: 2 days   Quetzaly Ebner C 02/09/2014,10:52 AM

## 2014-02-09 NOTE — Consult Note (Addendum)
WOC wound consult note Reason for Consult: Consult requested for sacral wound.  This was charted as a stage 2; present on admission.  Although wound is small and shallow, pt is very emaciated and bone is palpable.  Wound has not declined but should be classified as a stage 4. Family member at bedside states her appetite has been poor at home. Pt has many systemic factors which can impair healing; decreased albumin, immobility, incontinent, dialysis. Pt has lost 8% of her body weight in the past month according to the dietary consult note. Pressure Ulcer POA: Yes Measurement: .8X.2X.1cm Wound bed: pale red with bone palpable underneath wound bed Drainage (amount, consistency, odor) No odor, scant yellow drainage Periwound: erythremia surrounding wound to .5cm Dressing procedure/placement/frequency: Foam dressing has already been applied and low air loss mattress replacement has been ordered to reduce pressure. Nutrition consult has been ordered; protein supplements are ordered but pt po intake is poor.    Please re-consult if further assistance is needed.  Thank-you,  Julien Girt MSN, [place], [place], [place], [place]

## 2014-02-09 NOTE — Clinical Documentation Improvement (Addendum)
Possible Clinical Conditions   - Severe Malnutrition    - Other Type of Malnutrition   - Other Condition   - Unable to Clinically Determine or Not Applicable   Supporting Information:  RD Assessment 02/08/14 filed at 12 noon.   Thank You, Erling Conte ,RN BSN CCDS Certified Clinical Documentation Specialist:  [phone] [place] Information Management   Severe protein calorie malnutrition will be added to problem list, progress notes and discharge summary..  Thanks.   [phone]

## 2014-02-10 LAB — RENAL FUNCTION PANEL
Albumin: 2.1 g/dL — ABNORMAL LOW (ref 3.5–5.2)
BUN: 52 mg/dL — AB (ref 6–23)
CALCIUM: 8.6 mg/dL (ref 8.4–10.5)
CO2: 24 mEq/L (ref 19–32)
Chloride: 101 mEq/L (ref 96–112)
Creatinine, Ser: 2.84 mg/dL — ABNORMAL HIGH (ref 0.50–1.10)
GFR calc Af Amer: 17 mL/min — ABNORMAL LOW (ref >90)
GFR, EST NON AFRICAN AMERICAN: 14 mL/min — AB (ref >90)
Glucose, Bld: 84 mg/dL (ref 70–99)
Phosphorus: 5.5 mg/dL — ABNORMAL HIGH (ref 2.3–4.6)
Potassium: 3.7 mEq/L (ref 3.7–5.3)
Sodium: 142 mEq/L (ref 137–147)

## 2014-02-10 MED ORDER — POLYETHYLENE GLYCOL 3350 17 G PO PACK: 17.0000 g | PACK | Freq: Every day | ORAL | Status: DC

## 2014-02-10 MED ORDER — AMOXICILLIN-POT CLAVULANATE 500-125 MG PO TABS
1.0000 | ORAL_TABLET | Freq: Once | ORAL | Status: AC
  Administered 2014-02-10: 500 mg via ORAL
  Filled 2014-02-10 (×2): qty 1

## 2014-02-10 MED ORDER — HALOPERIDOL LACTATE 5 MG/ML IJ SOLN: 1.0000 mg | Freq: Four times a day (QID) | INTRAMUSCULAR | Status: DC | PRN

## 2014-02-10 NOTE — Progress Notes (Signed)
Patient AVF cannulated 17g needles without issue.When tx start pt bended her arm and her venous needle side infiltrated.ice bag given.Dr Florene Glen and RN [facility] notified.Pt BP 90/44.P65.R 18.O2 sat 100% in 2l O2 use.Pt sent to the room.

## 2014-02-10 NOTE — Progress Notes (Addendum)
Assessment:  1 Oliguric Stage 4/5 CKD with decompensation and probable uremic syndrome with CHF?PNA  2 s/p AVF  Plan:  1 Dialysis again today    Subjective: Interval History: First dialysis yesterday and 1.5 liters off.  Objective: Vital signs in last 24 hours: Temp:  [96.9 F (36.1 C)-98.6 F (37 C)] 97.8 F (36.6 C) (03/04 0950) Pulse Rate:  [64-79] 64 (03/04 0950) Resp:  [15-22] 18 (03/04 0950) BP: (80-124)/(28-66) 110/28 mmHg (03/04 0950) SpO2:  [94 %-100 %] 99 % (03/04 0950) Weight:  [44.7 kg (98 lb 8.7 oz)-45.1 kg (99 lb 6.8 oz)] 44.7 kg (98 lb 8.7 oz) (03/04 0757) Weight change:   Intake/Output from previous day: 03/03 0701 - 03/04 0700 In: 0  Out: 993  Intake/Output this shift: Total I/O In: 30 [P.O.:30] Out: -   General appearance: slowed mentation Resp: clear to auscultation bilaterally Chest wall: no tenderness frail appearing  Lab Results:  Recent Labs  02/07/14 1726  WBC 8.1  HGB 8.7*  HCT 29.1*  PLT 144*   BMET:  Recent Labs  02/07/14 1726 02/09/14 0333  NA 146 144  K 4.5 4.7  CL 106 105  CO2 22 20  GLUCOSE 90 98  BUN 89* 100*  CREATININE 2.98* 3.74*  CALCIUM 9.2 8.7   No results found for this basename: PTH,  in the last 72 hours Iron Studies: No results found for this basename: IRON, TIBC, TRANSFERRIN, FERRITIN,  in the last 72 hours Studies/Results: No results found.  Scheduled: . albuterol  2.5 mg Nebulization Q6H  . allopurinol  100 mg Oral QODAY  . amoxicillin-clavulanate  1 tablet Oral QHS  . antiseptic oral rinse  15 mL Mouth Rinse BID  . aspirin EC  81 mg Oral Daily  . calcitRIOL  0.5 mcg Oral QODAY  . carvedilol  3.125 mg Oral BID WC  . [START ON 02/12/2014] darbepoetin  25 mcg Subcutaneous Q7 days  . feeding supplement (RESOURCE BREEZE)  1 Container Oral TID BM  . heparin  5,000 Units Subcutaneous 3 times per day  . levothyroxine  75 mcg Oral QAC breakfast  . multivitamin  1 tablet Oral QHS  . pantoprazole  40 mg  Oral Daily  . polyethylene glycol  17 g Oral Daily  . sevelamer carbonate  800 mg Oral TID WC  . sodium chloride  3 mL Intravenous Q12H  . sodium chloride  3 mL Intravenous Q12H     LOS: 3 days   Janett Kamath C 02/10/2014,11:07 AM

## 2014-02-10 NOTE — Progress Notes (Addendum)
[facility] PROGRESS NOTE Assessment/Plan: Acute on chronic resp failure due to  Acute diastolic heart failure: - Unlikely PNA component to be the cause of culprit.  - Renal has been consulted and will follow rec's  - Failed aggressive diuretics treatment. - For HD, on low dose beta-blocker. - Discontinue broad spectrum antibiotics; will narrow to just augmentin (finish previous antibiotic regimen; 4 days total left)  - pt appears decondition malnourish. - family will like to meet with PMT GOC.  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  Presumed demand ischemia/elvated troponin:  - Rest of troponin neg. Assumed initial elevated troponin due to renal failure.  - Will continue low dose b-blocker  - Volume to be controlled with HD   PAD:  - S/p right AKA  - Vascular surgery consulted  - Wll follow rec's   Decubitus ulcer:  - Will continue prevention measures.  - Will ordered overlay mattress  - Wound care consulted, will follow rec's.  Delirium: - d/c ativan, use haldol.  Anxiety and pain: will change xanax to klonopin and adjust morphine orders.  -will follow clinical response   Severe protein calorie malnutrition: will follow dietician recommendations  -diet advance to full liquid and if tolerated will advance to regular.    Code Status: Full  Family Communication: granddaughter at bedside  Disposition Plan: to be determine; will most likely need SNF   Consultants:  renal  Procedures:  HD  Antibiotics:  none   HPI/Subjective: sleepy  Objective: Filed Vitals:   02/10/14 0707 02/10/14 0754 02/10/14 0757 02/10/14 0950  BP:    110/28  Pulse: 71   64  Temp: 97.1 F (36.2 C)   97.8 F (36.6 C)  TempSrc: Axillary   Axillary  Resp:    18  Height:      Weight:   44.7 kg (98 lb 8.7 oz)   SpO2: 98% 97%  99%     Intake/Output Summary (Last 24 hours) at 02/10/14 1029 Last data filed at 02/10/14 0837  Gross per 24 hour  Intake     30 ml  Output    993 ml  Net   -963 ml   Filed Weights   02/08/14 0500 02/09/14 1756 02/10/14 0757  Weight: 46.04 kg (101 lb 8 oz) 45.1 kg (99 lb 6.8 oz) 44.7 kg (98 lb 8.7 oz)    Exam:  General: Alert, awake, oriented x3, in no acute distress.  HEENT: No bruits, no goiter.  Heart: Regular rate and rhythm, without murmurs, rubs, gallops.  Lungs: Good air movement, clear Abdomen: Soft, nontender, nondistended, positive bowel sounds.    Data Reviewed: Basic Metabolic Panel:  Recent Labs Lab 02/04/14 0455 02/07/14 1726 02/09/14 0333  NA 144 146 144  K 3.1* 4.5 4.7  CL 106 106 105  CO2 21 22 20   GLUCOSE 101* 90 98  BUN 86* 89* 100*  CREATININE 1.94* 2.98* 3.74*  CALCIUM 8.6 9.2 8.7  PHOS  --   --  7.2*   Liver Function Tests:  Recent Labs Lab 02/09/14 0333  ALBUMIN 2.1*   No results found for this basename: LIPASE, AMYLASE,  in the last 168 hours No results found for this basename: AMMONIA,  in the last 168 hours CBC:  Recent Labs Lab 02/04/14 0455 02/07/14 1726  WBC 6.7 8.1  NEUTROABS  --  6.5  HGB 8.3* 8.7*  HCT 27.0* 29.1*  MCV 105.1* 107.0*  PLT 112* 144*   Cardiac Enzymes:  Recent Labs Lab 02/08/14 0116 02/08/14 0350 02/08/14 1023  TROPONINI <0.30 <0.30 <0.30   BNP (last 3 results)  Recent Labs  02/02/14 0138  PROBNP 15475.0*   CBG: No results found for this basename: GLUCAP,  in the last 168 hours  Recent Results (from the past 240 hour(s))  CULTURE, BLOOD (ROUTINE X 2)     Status: None   Collection Time    02/01/14  7:50 PM      Result Value Ref Range Status   Specimen Description BLOOD RIGHT ARM   Final   Special Requests BOTTLES DRAWN AEROBIC AND ANAEROBIC 5ML   Final   Culture  Setup Time     Final   Value: 02/02/2014 00:57     Performed at [facility]   Culture     Final   Value: NO  GROWTH 5 DAYS     Performed at [facility]   Report Status 02/08/2014 FINAL   Final  CULTURE, BLOOD (ROUTINE X 2)     Status: None   Collection Time    02/01/14  8:10 PM      Result Value Ref Range Status   Specimen Description BLOOD RIGHT FOREARM   Final   Special Requests BOTTLES DRAWN AEROBIC ONLY 2ML   Final   Culture  Setup Time     Final   Value: 02/02/2014 00:57     Performed at [facility]   Culture     Final   Value: NO GROWTH 5 DAYS     Performed at [facility]   Report Status 02/08/2014 FINAL   Final  URINE CULTURE     Status: None   Collection Time    02/01/14  8:26 PM      Result Value Ref Range Status   Specimen Description URINE, CLEAN CATCH   Final   Special Requests NONE   Final   Culture  Setup Time     Final   Value: 02/02/2014 01:10     Performed at [facility]     Final   Value: >=100,000 COLONIES/ML     Performed at [facility]   Culture     Final   Value: PROTEUS MIRABILIS     Performed at [facility]   Report Status 02/04/2014 FINAL   Final   Organism ID, Bacteria PROTEUS MIRABILIS   Final  MRSA PCR SCREENING     Status: None   Collection Time    02/02/14 10:16 AM      Result Value Ref Range Status   MRSA by PCR NEGATIVE  NEGATIVE Final   Comment:            The GeneXpert MRSA Assay (FDA     approved for NASAL specimens     only), is one component of a     comprehensive MRSA colonization     surveillance program. It is not     intended to diagnose MRSA     infection nor to guide or     monitor treatment for     MRSA infections.  CLOSTRIDIUM DIFFICILE BY PCR     Status: None   Collection Time    02/02/14  7:09 PM      Result Value Ref Range Status   C difficile by pcr  NEGATIVE  NEGATIVE Final   Comment: Performed at [facility], EXPECTORATED SPUTUM-ASSESSMENT     Status: None   Collection Time    02/03/14 11:34 AM      Result Value Ref Range Status    Specimen Description SPUTUM   Final   Special Requests NONE   Final   Sputum evaluation     Final   Value: THIS SPECIMEN IS ACCEPTABLE. RESPIRATORY CULTURE REPORT TO FOLLOW.   Report Status 02/03/2014 FINAL   Final  CULTURE, RESPIRATORY (NON-EXPECTORATED)     Status: None   Collection Time    02/03/14 11:34 AM      Result Value Ref Range Status   Specimen Description SPUTUM   Final   Special Requests NONE   Final   Gram Stain     Final   Value: NO WBC SEEN     NO SQUAMOUS EPITHELIAL CELLS SEEN     NO ORGANISMS SEEN     Performed at [facility]   Culture     Final   Value: NORMAL OROPHARYNGEAL FLORA     Performed at [facility]   Report Status 02/05/2014 FINAL   Final  CULTURE, BLOOD (ROUTINE X 2)     Status: None   Collection Time    02/07/14  8:10 PM      Result Value Ref Range Status   Specimen Description BLOOD RIGHT FOREARM   Final   Special Requests BOTTLES DRAWN AEROBIC AND ANAEROBIC 10CC EA   Final   Culture  Setup Time     Final   Value: 02/08/2014 03:29     Performed at [facility]   Culture     Final   Value:        BLOOD CULTURE RECEIVED NO GROWTH TO DATE CULTURE WILL BE HELD FOR 5 DAYS BEFORE ISSUING A FINAL NEGATIVE REPORT     Performed at [facility]   Report Status PENDING   Incomplete  CULTURE, BLOOD (ROUTINE X 2)     Status: None   Collection Time    02/07/14  8:13 PM      Result Value Ref Range Status   Specimen Description BLOOD RIGHT ANTECUBITAL   Final   Special Requests BOTTLES DRAWN AEROBIC AND ANAEROBIC 10CC EA   Final   Culture  Setup Time     Final   Value: 02/08/2014 03:29     Performed at [facility]   Culture     Final   Value:        BLOOD CULTURE RECEIVED NO GROWTH TO DATE CULTURE WILL BE HELD FOR 5 DAYS BEFORE ISSUING A FINAL NEGATIVE REPORT     Performed at [facility]   Report Status PENDING   Incomplete  URINE CULTURE     Status: None   Collection Time    02/07/14  8:42 PM       Result Value Ref Range Status   Specimen Description URINE, CATHETERIZED   Final   Special Requests NONE   Final   Culture  Setup Time     Final   Value: 02/08/2014 04:04     Performed at [facility]     Final   Value: NO GROWTH     Performed at [facility]   Culture     Final   Value: NO GROWTH     Performed at [facility]  Report Status 02/09/2014 FINAL   Final     Studies: No results found.  Scheduled Meds: . albuterol  2.5 mg Nebulization Q6H  . allopurinol  100 mg Oral QODAY  . amoxicillin-clavulanate  1 tablet Oral QHS  . antiseptic oral rinse  15 mL Mouth Rinse BID  . aspirin EC  81 mg Oral Daily  . calcitRIOL  0.5 mcg Oral QODAY  . carvedilol  3.125 mg Oral BID WC  . [START ON 02/12/2014] darbepoetin  25 mcg Subcutaneous Q7 days  . feeding supplement (RESOURCE BREEZE)  1 Container Oral TID BM  . heparin  5,000 Units Subcutaneous 3 times per day  . levothyroxine  75 mcg Oral QAC breakfast  . multivitamin  1 tablet Oral QHS  . pantoprazole  40 mg Oral Daily  . polyethylene glycol  17 g Oral Daily  . sevelamer carbonate  800 mg Oral TID WC  . sodium chloride  3 mL Intravenous Q12H  . sodium chloride  3 mL Intravenous Q12H   Continuous Infusions:    Charlynne Cousins  [facility] Pager [phone]. If 8PM-8AM, please contact night-coverage at [email or web address], password [facility] 02/10/2014, 10:29 AM  LOS: 3 days

## 2014-02-10 NOTE — Progress Notes (Addendum)
0700-1900 Pt.is A/Ox1 and is on bedrest. Patient has very poor po intake. She had some signs and symptoms of pain and prn pain medication was given. Dressing to right AKA was changed, very minimal serous drainage. Patient went to Hemodialysis around 1430. Received a call from hemodialysis RN. Was told that hemodialysis couldn't be done due to pt.bending her arm and infiltration at the fistula site and ice pack was applied. Pt.arrived back to the unit with ice pack on left upper arm. No bleeding noted. Pt.'s daughter came to visit and she was notified of what happened in hemodialysis.

## 2014-02-10 NOTE — Progress Notes (Signed)
PT Cancellation Note  Patient Details Name: [name] MRN: [id number] DOB: September 07, 1929   Cancelled Treatment:    Reason Eval/Treat Not Completed: Medical issues which prohibited therapy (husband asked [place] to defer til another day)   Norwood Levo 02/10/2014, 12:25 PM

## 2014-02-10 NOTE — Progress Notes (Signed)
Dressing change performed. Old dressing removed. Small amount of drainage noted- serosanguinous with very minimal purulent drainage. No odor present. Open area cleaned. Wet to dry applied to open area. Applied Abd pad to cover wet to dry and secured with tape. Dressing is clean, dry, and intact. Patient turned. Family at bedside. Will continue to monitor to end of shift.

## 2014-02-10 NOTE — Progress Notes (Signed)
Dressing removed and every other staple removed.  Will order a rentention sock for right AKA stump.  [name] 02/10/2014 8:50 AM

## 2014-02-10 NOTE — Clinical Social Work Placement (Signed)
     Clinical Social Work Department CLINICAL SOCIAL WORK PLACEMENT NOTE 02/10/2014  Patient:  [name], [name]  Account Number:  [id number] Admit date:  02/07/2014  Clinical Social Worker:  Butch Penny Taegen Delker, Latanya Presser  Date/time:  02/09/2014 03:34 PM  Clinical Social Work is seeking post-discharge placement for this patient at the following level of care:   SKILLED NURSING   (*CSW will update this form in Epic as items are completed)   02/09/2014  Patient/family provided with [place] Department of Clinical Social Works list of facilities offering this level of care within the geographic area requested by the patient (or if unable, by the patients family).  02/09/2014  Patient/family informed of their freedom to choose among providers that offer the needed level of care, that participate in Medicare, Medicaid or managed care program needed by the patient, have an available bed and are willing to accept the patient.  02/09/2014  Patient/family informed of [facility] ownership interest in [facility], as well as of the fact that they are under no obligation to receive care at this facility.  PASARR submitted to EDS on  PASARR number received from [place] on   FL2 transmitted to all facilities in geographic area requested by pt/family on  02/09/2014 FL2 transmitted to all facilities within larger geographic area on   Patient informed that his/her managed care company has contracts with or will negotiate with  certain facilities, including the following:   NA     Patient/family informed of bed offers received:   Patient chooses bed at  Physician recommends and patient chooses bed at    Patient to be transferred to  on   Patient to be transferred to facility by Ambulance  Corey Harold)  The following physician request were entered in Epic:   Additional Comments:

## 2014-02-11 ENCOUNTER — Encounter: Payer: Self-pay | Admitting: Vascular Surgery

## 2014-02-11 DIAGNOSIS — I4891 Unspecified atrial fibrillation: Secondary | ICD-10-CM

## 2014-02-11 LAB — GLUCOSE, CAPILLARY: Glucose-Capillary: 77 mg/dL (ref 70–99)

## 2014-02-11 MED ORDER — ALPRAZOLAM 0.25 MG PO TABS: 0.2500 mg | ORAL_TABLET | Freq: Two times a day (BID) | ORAL | Status: DC

## 2014-02-11 NOTE — Progress Notes (Signed)
Respirations down to 12 per respiratory therapist. RN had given pt PRN Klonopin and PRN Morphine for agitation and pain recently. Will notify MD and continue to monitor.

## 2014-02-11 NOTE — Progress Notes (Addendum)
Patient GP:QDIYM [name]      DOB: 02-12-1929      EBR:[id number]  Spoke with Daughter [name] late last evening.  [name] works with a Economist.  She asked that we not engage until they are able to try the three dialysis treatments that they "promised their dad".  I asked if we could check with nephrology and be present for a follow up meeting on Friday at  10 am.  She said that will be ok.  I will contact nephrology this am to request collaboration.  [name] L. Lovena Le, MD MBA The Palliative Medicine Team at [facility] Phone: [phone] Pager: [phone]   Discussed Case with Dr. Florene Glen.  Timing of meeting not fully set .  We will schedule a separate time with family for Friday.  Britt Petroni L. Lovena Le, MD MBA The Palliative Medicine Team at [facility] Phone: [phone] Pager: [phone]

## 2014-02-11 NOTE — Progress Notes (Signed)
PT Cancellation Note  Patient Details Name: [name] MRN: [id number] DOB: 1929-06-26   Cancelled Treatment:    Reason Eval/Treat Not Completed: Medical issues which prohibited therapy.  Pt lethargic.  Unable to arouse.  Will check back and proceed with PT as able at later date.  Daughter present and in agreement.  Thannks.   INGOLD,Beauford Lando 02/11/2014, 1:37 PM  [facility] [phone] (pager)

## 2014-02-11 NOTE — Progress Notes (Signed)
   Daily Progress Note  Assessment/Planning: S/p R AKA, Heart failure, Imminent ESRD   Pt on downward trajectory with imminent ESRD  I would wait to see what family decides on in regards to end of life care before considering any more aggressive surgical interventions for her wounds  Continue wet-to-dry dressing for now  Subjective   Asleep  Objective Filed Vitals:   02/11/14 0119 02/11/14 0525 02/11/14 1016 02/11/14 1051  BP:  111/30  111/35  Pulse:  69  72  Temp:  98 F (36.7 C)  98.7 F (37.1 C)  TempSrc:  Axillary  Axillary  Resp:  14  16  Height:      Weight:  100 lb 12 oz (45.7 kg)    SpO2: 99% 99% 97% 97%    Intake/Output Summary (Last 24 hours) at 02/11/14 1320 Last data filed at 02/11/14 0916  Gross per 24 hour  Intake      0 ml  Output      0 ml  Net      0 ml   VASC  R AKA bandaged (not changed due to pt being asleep)  Laboratory CBC    Component Value Date/Time   WBC 8.1 02/07/2014 1726   HGB 8.7* 02/07/2014 1726   HCT 29.1* 02/07/2014 1726   PLT 144* 02/07/2014 1726    BMET    Component Value Date/Time   NA 142 02/10/2014 1445   K 3.7 02/10/2014 1445   CL 101 02/10/2014 1445   CO2 24 02/10/2014 1445   GLUCOSE 84 02/10/2014 1445   BUN 52* 02/10/2014 1445   CREATININE 2.84* 02/10/2014 1445   CALCIUM 8.6 02/10/2014 1445   CALCIUM 9.9 12/01/2013 1300   GFRNONAA 14* 02/10/2014 [place]* 02/10/2014 1445    Adele Barthel, MD [facility]: [phone] Pager: [phone]  02/11/2014, 1:20 PM

## 2014-02-11 NOTE — Progress Notes (Signed)
[facility] PROGRESS NOTE Assessment/Plan: Acute on chronic resp failure due to  Acute diastolic heart failure: - Unlikely PNA component to be the cause of culprit.  - Fluid balance improved. - Failed aggressive diuretics treatment. - s/p HD 3.4.2015, d/c ativan and IV narcotics. - Discontinue broad spectrum antibiotics; will narrow to just augmentin (finish previous antibiotic regimen; 4 days total left)  - pt appears decondition malnourish. - family will like to meet with PMT GOC.  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  Presumed demand ischemia/elvated troponin:  - Rest of troponin neg. Assumed initial elevated troponin due to renal failure.  - Will continue low dose b-blocker  - Volume to be controlled with HD   PAD:  - S/p right AKA  - Vascular surgery consulted  - Wll follow rec's   Decubitus ulcer:  - Will continue prevention measures.  - Will ordered overlay mattress  - Wound care consulted, will follow rec's.  Delirium: - Use haldol.  Anxiety and pain: will change xanax to klonopin and adjust morphine orders.  -will follow clinical response   Severe protein calorie malnutrition: will follow dietician recommendations  -diet advance to full liquid and if tolerated will advance to regular.    Code Status: Full  Family Communication: granddaughter at bedside  Disposition Plan: to be determine; will most likely need SNF   Consultants:  renal  Procedures:  HD  Antibiotics:  none   HPI/Subjective: sleepy  Objective: Filed Vitals:   02/11/14 0119 02/11/14 0525 02/11/14 1016 02/11/14 1051  BP:  111/30  111/35  Pulse:  69  72  Temp:  98 F (36.7 C)  98.7 F (37.1 C)  TempSrc:  Axillary  Axillary  Resp:  14  16  Height:      Weight:  45.7 kg (100 lb 12 oz)    SpO2: 99% 99% 97% 97%    Intake/Output  Summary (Last 24 hours) at 02/11/14 1152 Last data filed at 02/11/14 0916  Gross per 24 hour  Intake      0 ml  Output      0 ml  Net      0 ml   Filed Weights   02/09/14 1756 02/10/14 0757 02/11/14 0525  Weight: 45.1 kg (99 lb 6.8 oz) 44.7 kg (98 lb 8.7 oz) 45.7 kg (100 lb 12 oz)    Exam:  General:  in no acute distress only responsive to papin HEENT: No bruits, no goiter. +JVD Heart: Regular rate and rhythm, without murmurs, rubs, gallops.  Lungs: Good air movement, clear Abdomen: Soft, nontender, nondistended, positive bowel sounds.    Data Reviewed: Basic Metabolic Panel:  Recent Labs Lab 02/07/14 1726 02/09/14 0333 02/10/14 1445  NA 146 144 142  K 4.5 4.7 3.7  CL 106 105 101  CO2 22 20 24   GLUCOSE 90 98 84  BUN 89* 100* 52*  CREATININE 2.98* 3.74* 2.84*  CALCIUM 9.2 8.7 8.6  PHOS  --  7.2* 5.5*   Liver Function Tests:  Recent Labs Lab 02/09/14 0333 02/10/14 1445  ALBUMIN 2.1* 2.1*   No results found for this basename: LIPASE, AMYLASE,  in the last 168 hours No results found for this basename: AMMONIA,  in the last 168 hours CBC:  Recent Labs Lab 02/07/14 1726  WBC 8.1  NEUTROABS 6.5  HGB 8.7*  HCT 29.1*  MCV 107.0*  PLT 144*   Cardiac Enzymes:  Recent Labs Lab 02/08/14 0116 02/08/14 0350 02/08/14 1023  TROPONINI <0.30 <0.30 <0.30   BNP (last 3 results)  Recent Labs  02/02/14 0138  PROBNP 15475.0*   CBG:  Recent Labs Lab 02/11/14 0056  GLUCAP 77    Recent Results (from the past 240 hour(s))  CULTURE, BLOOD (ROUTINE X 2)     Status: None   Collection Time    02/01/14  7:50 PM      Result Value Ref Range Status   Specimen Description BLOOD RIGHT ARM   Final   Special Requests BOTTLES DRAWN AEROBIC AND ANAEROBIC 5ML   Final   Culture  Setup Time     Final   Value: 02/02/2014 00:57     Performed at [facility]   Culture     Final   Value: NO GROWTH 5 DAYS     Performed at [facility]   Report Status  02/08/2014 FINAL   Final  CULTURE, BLOOD (ROUTINE X 2)     Status: None   Collection Time    02/01/14  8:10 PM      Result Value Ref Range Status   Specimen Description BLOOD RIGHT FOREARM   Final   Special Requests BOTTLES DRAWN AEROBIC ONLY 2ML   Final   Culture  Setup Time     Final   Value: 02/02/2014 00:57     Performed at [facility]   Culture     Final   Value: NO GROWTH 5 DAYS     Performed at [facility]   Report Status 02/08/2014 FINAL   Final  URINE CULTURE     Status: None   Collection Time    02/01/14  8:26 PM      Result Value Ref Range Status   Specimen Description URINE, CLEAN CATCH   Final   Special Requests NONE   Final   Culture  Setup Time     Final   Value: 02/02/2014 01:10     Performed at [place]     Final   Value: >=100,000 COLONIES/ML     Performed at [facility]   Culture     Final   Value: PROTEUS MIRABILIS     Performed at [facility]   Report Status 02/04/2014 FINAL   Final   Organism ID, Bacteria PROTEUS MIRABILIS   Final  MRSA PCR SCREENING     Status: None   Collection Time    02/02/14 10:16 AM      Result Value Ref Range Status   MRSA by PCR NEGATIVE  NEGATIVE Final   Comment:            The GeneXpert MRSA Assay (FDA     approved for NASAL specimens     only), is one component of a     comprehensive MRSA colonization     surveillance program. It is not     intended to diagnose MRSA     infection nor to guide or     monitor treatment for     MRSA infections.  CLOSTRIDIUM DIFFICILE BY PCR     Status: None   Collection Time    02/02/14  7:09 PM      Result Value Ref Range Status   C difficile by pcr NEGATIVE  NEGATIVE Final   Comment: Performed at [facility]  Hospital  CULTURE, EXPECTORATED SPUTUM-ASSESSMENT     Status: None   Collection Time    02/03/14 11:34 AM      Result Value Ref Range Status   Specimen Description SPUTUM   Final   Special Requests NONE   Final    Sputum evaluation     Final   Value: THIS SPECIMEN IS ACCEPTABLE. RESPIRATORY CULTURE REPORT TO FOLLOW.   Report Status 02/03/2014 FINAL   Final  CULTURE, RESPIRATORY (NON-EXPECTORATED)     Status: None   Collection Time    02/03/14 11:34 AM      Result Value Ref Range Status   Specimen Description SPUTUM   Final   Special Requests NONE   Final   Gram Stain     Final   Value: NO WBC SEEN     NO SQUAMOUS EPITHELIAL CELLS SEEN     NO ORGANISMS SEEN     Performed at [facility]   Culture     Final   Value: NORMAL OROPHARYNGEAL FLORA     Performed at [facility]   Report Status 02/05/2014 FINAL   Final  CULTURE, BLOOD (ROUTINE X 2)     Status: None   Collection Time    02/07/14  8:10 PM      Result Value Ref Range Status   Specimen Description BLOOD RIGHT FOREARM   Final   Special Requests BOTTLES DRAWN AEROBIC AND ANAEROBIC 10CC EA   Final   Culture  Setup Time     Final   Value: 02/08/2014 03:29     Performed at [facility]   Culture     Final   Value:        BLOOD CULTURE RECEIVED NO GROWTH TO DATE CULTURE WILL BE HELD FOR 5 DAYS BEFORE ISSUING A FINAL NEGATIVE REPORT     Performed at [facility]   Report Status PENDING   Incomplete  CULTURE, BLOOD (ROUTINE X 2)     Status: None   Collection Time    02/07/14  8:13 PM      Result Value Ref Range Status   Specimen Description BLOOD RIGHT ANTECUBITAL   Final   Special Requests BOTTLES DRAWN AEROBIC AND ANAEROBIC 10CC EA   Final   Culture  Setup Time     Final   Value: 02/08/2014 03:29     Performed at [facility]   Culture     Final   Value:        BLOOD CULTURE RECEIVED NO GROWTH TO DATE CULTURE WILL BE HELD FOR 5 DAYS BEFORE ISSUING A FINAL NEGATIVE REPORT     Performed at [facility]   Report Status PENDING   Incomplete  URINE CULTURE     Status: None   Collection Time    02/07/14  8:42 PM      Result Value Ref Range Status   Specimen Description URINE, CATHETERIZED    Final   Special Requests NONE   Final   Culture  Setup Time     Final   Value: 02/08/2014 04:04     Performed at [place]     Final   Value: NO GROWTH     Performed at [facility]   Culture     Final   Value: NO GROWTH     Performed at [facility]   Report Status 02/09/2014 FINAL   Final  Studies: No results found.  Scheduled Meds: . albuterol  2.5 mg Nebulization Q6H  . allopurinol  100 mg Oral QODAY  . amoxicillin-clavulanate  1 tablet Oral QHS  . antiseptic oral rinse  15 mL Mouth Rinse BID  . aspirin EC  81 mg Oral Daily  . calcitRIOL  0.5 mcg Oral QODAY  . carvedilol  3.125 mg Oral BID WC  . [START ON 02/12/2014] darbepoetin  25 mcg Subcutaneous Q7 days  . feeding supplement (RESOURCE BREEZE)  1 Container Oral TID BM  . heparin  5,000 Units Subcutaneous 3 times per day  . levothyroxine  75 mcg Oral QAC breakfast  . multivitamin  1 tablet Oral QHS  . pantoprazole  40 mg Oral Daily  . polyethylene glycol  17 g Oral Daily  . sevelamer carbonate  800 mg Oral TID WC  . sodium chloride  3 mL Intravenous Q12H  . sodium chloride  3 mL Intravenous Q12H   Continuous Infusions:    Charlynne Cousins  [facility] Pager [phone]. If 8PM-8AM, please contact night-coverage at [email or web address], password [facility] 02/11/2014, 11:52 AM  LOS: 4 days

## 2014-02-11 NOTE — Progress Notes (Signed)
Pt. Was very lethargic the entire shift and unable to be aroused. MD was made aware early in the shift and pt. Was made N.P.O. And her I.V. Pain medication not given b/c pt. Still so lethargic and to avoid sedating pt. More. MD and myself educated pt.'s family why I.V. Pain medication not advised. After pt.'s last Respiratory treatment I went in pt.'s room and noticed that pt.'s O2 was turned off & her O2 Sats were at 88%. I immediately turned pt.'s O2 back on and to 3 L and pt.'s Sats increased back to 98%. Pt. Showed no other signs and symptoms of distress.

## 2014-02-11 NOTE — Progress Notes (Signed)
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  Azotemia significantly improved.  See where things go with family and wearing off of sedation.    Subjective: Interval History: Got two doses of clonazepam yesterday.  Shortened dialysis due to infiltration from moving arm  Objective: Vital signs in last 24 hours: Temp:  [97.5 F (36.4 C)-98.7 F (37.1 C)] 98.7 F (37.1 C) (03/05 1051) Pulse Rate:  [62-72] 72 (03/05 1051) Resp:  [12-18] 16 (03/05 1051) BP: (91-118)/(30-38) 111/35 mmHg (03/05 1051) SpO2:  [96 %-100 %] 97 % (03/05 1051) Weight:  [45.7 kg (100 lb 12 oz)] 45.7 kg (100 lb 12 oz) (03/05 0525) Weight change: -0.4 kg (-14.1 oz)  Intake/Output from previous day: 03/04 0701 - 03/05 0700 In: 30 [P.O.:30] Out: -  Intake/Output this shift:    General appearance: slowed mentation and sedated Chest wall: no tenderness Cardio: regular rate and rhythm, S1, S2 normal, no murmur, click, rub or gallop GI: soft, non-tender; bowel sounds normal; no masses,  no organomegaly  Lab Results: No results found for this basename: WBC, HGB, HCT, PLT,  in the last 72 hours BMET:  Recent Labs  02/09/14 0333 02/10/14 1445  NA 144 142  K 4.7 3.7  CL 105 101  CO2 20 24  GLUCOSE 98 84  BUN 100* 52*  CREATININE 3.74* 2.84*  CALCIUM 8.7 8.6   No results found for this basename: PTH,  in the last 72 hours Iron Studies: No results found for this basename: IRON, TIBC, TRANSFERRIN, FERRITIN,  in the last 72 hours Studies/Results: No results found.  Scheduled: . albuterol  2.5 mg Nebulization Q6H  . allopurinol  100 mg Oral QODAY  . amoxicillin-clavulanate  1 tablet Oral QHS  . antiseptic oral rinse  15 mL Mouth Rinse BID  . aspirin EC  81 mg Oral Daily  . calcitRIOL  0.5 mcg Oral QODAY  . carvedilol  3.125 mg Oral BID WC  . [START ON 02/12/2014] darbepoetin   25 mcg Subcutaneous Q7 days  . feeding supplement (RESOURCE BREEZE)  1 Container Oral TID BM  . heparin  5,000 Units Subcutaneous 3 times per day  . levothyroxine  75 mcg Oral QAC breakfast  . multivitamin  1 tablet Oral QHS  . pantoprazole  40 mg Oral Daily  . polyethylene glycol  17 g Oral Daily  . sevelamer carbonate  800 mg Oral TID WC  . sodium chloride  3 mL Intravenous Q12H  . sodium chloride  3 mL Intravenous Q12H     LOS: 4 days   Coleton Woon C 02/11/2014,11:36 AM

## 2014-02-12 ENCOUNTER — Ambulatory Visit: Payer: Medicare Other | Admitting: Vascular Surgery

## 2014-02-12 DIAGNOSIS — Z515 Encounter for palliative care: Secondary | ICD-10-CM

## 2014-02-12 DIAGNOSIS — E41 Nutritional marasmus: Secondary | ICD-10-CM

## 2014-02-12 MED ORDER — SODIUM CHLORIDE 0.9 % IV SOLN
1.5000 g | Freq: Two times a day (BID) | INTRAVENOUS | Status: DC
  Administered 2014-02-12 – 2014-02-13 (×3): 1.5 g via INTRAVENOUS
  Filled 2014-02-12 (×4): qty 1.5

## 2014-02-12 MED ORDER — MORPHINE SULFATE 2 MG/ML IJ SOLN
1.0000 mg | INTRAMUSCULAR | Status: DC | PRN
  Administered 2014-02-12 – 2014-02-13 (×3): 1 mg via INTRAVENOUS
  Filled 2014-02-12 (×3): qty 1

## 2014-02-12 MED ORDER — ALBUTEROL SULFATE (2.5 MG/3ML) 0.083% IN NEBU
2.5000 mg | INHALATION_SOLUTION | Freq: Three times a day (TID) | RESPIRATORY_TRACT | Status: DC
  Administered 2014-02-12 – 2014-02-13 (×4): 2.5 mg via RESPIRATORY_TRACT
  Filled 2014-02-12 (×4): qty 3

## 2014-02-12 NOTE — Progress Notes (Signed)
Notified on-call hospitalist of patient grimacing, moaning, and crying. Daughter is at bedside and was requesting something for pain for patient for she did not want her to be in pain like that. Patient also had clutched fist. Orders given for PRN morphine 1mg  IV. Administered morphine per PRN order. Will continue to monitor patient to end of shift.

## 2014-02-12 NOTE — Progress Notes (Signed)
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  Subjective: Interval History: no real change   Objective: Vital signs in last 24 hours: Temp:  [97.5 F (36.4 C)-98.2 F (36.8 C)] 97.6 F (36.4 C) (03/06 0620) Pulse Rate:  [70-76] 72 (03/06 0620) Resp:  [14-16] 16 (03/06 0620) BP: (96-106)/(33-58) 102/33 mmHg (03/06 0620) SpO2:  [97 %-99 %] 98 % (03/06 0950) Weight:  [40.8 kg (89 lb 15.2 oz)] 40.8 kg (89 lb 15.2 oz) (03/06 0500) Weight change: -3.9 kg (-8 lb 9.6 oz)  Intake/Output from previous day:   Intake/Output this shift:    General appearance: minimally responsive Cardio: regular rate and rhythm, S1, S2 normal, no murmur, click, rub or gallop Extremities: right BKA, LLE with tr edema occasional; moans  Lab Results: No results found for this basename: WBC, HGB, HCT, PLT,  in the last 72 hours BMET:  Recent Labs  02/10/14 1445  NA 142  K 3.7  CL 101  CO2 24  GLUCOSE 84  BUN 52*  CREATININE 2.84*  CALCIUM 8.6   No results found for this basename: PTH,  in the last 72 hours Iron Studies: No results found for this basename: IRON, TIBC, TRANSFERRIN, FERRITIN,  in the last 72 hours Studies/Results: No results found.  Scheduled: . albuterol  2.5 mg Nebulization TID  . allopurinol  100 mg Oral QODAY  . ALPRAZolam  0.25 mg Oral BID  . ampicillin-sulbactam (UNASYN) IV  1.5 g Intravenous Q12H  . antiseptic oral rinse  15 mL Mouth Rinse BID  . aspirin EC  81 mg Oral Daily  . calcitRIOL  0.5 mcg Oral QODAY  . carvedilol  3.125 mg Oral BID WC  . darbepoetin  25 mcg Subcutaneous Q7 days  . feeding supplement (RESOURCE BREEZE)  1 Container Oral TID BM  . heparin  5,000 Units Subcutaneous 3 times per day  . levothyroxine  75 mcg Oral QAC breakfast  .  multivitamin  1 tablet Oral QHS  . pantoprazole  40 mg Oral Daily  . polyethylene glycol  17 g Oral Daily  . sevelamer carbonate  800 mg Oral TID WC  . sodium chloride  3 mL Intravenous Q12H  . sodium chloride  3 mL Intravenous Q12H       LOS: 5 days   Hoda Hon C 02/12/2014,11:21 AM

## 2014-02-12 NOTE — Progress Notes (Signed)
CSW received a call from Dr. Lovena Le, Palliative Care-this afternoon.  Per her meeting with patient and family- they have elected to stop dialysis and seek residential Hospice Home services.  Family prefers [facility]. CSW spoke to [place]- Intake at [facility]- they currently do not have any openings but will accept referral. CSW sent necessary clinicals to [facility] for review;  Weekend CSW will monitor and assist should a vacancy occur.  CSW spoke to patient's daughter [name] who confirmed and agreed with above information.  CSW will assist with d/c to residential hospice home when bed is available.  If [facility] does not have a vacancy soon- CSW will need to seek other possible residential hospice homes.  Lorie Phenix. [place], [place]

## 2014-02-12 NOTE — Progress Notes (Signed)
UR completed TRUE Garciamartinez K. Ples Trudel, RN, BSN, [place], CCM  02/12/2014 11:43 AM

## 2014-02-12 NOTE — Consult Note (Addendum)
Patient [name]      DOB: [birth year]      ZHY:[id number]     Consult Note from the Palliative Medicine Team at [facility] Requested by: Dr. Laury Axon, MD Reason for Consultation: [facility]   Phone [phone]  Assessment of patients Current state: 80 yr old white female with known chronic kidney disease , previous amputation of the right leg. Recent Influenza with hospitalization.  Returned to hospital 48 hours after discharge with shortness of breath and right shoulder pain. Patient being treated for HCAP, Sirs, and worsening renal failure.  Family reports that they has insisted on dialysis for [facility] , that she said she did not want this and so they are now going to honor her wish.  They stated that she has always wanted to die in her sleep and so they have chosen a DNR status for her.  They would like to transition her to [facility] where they had taken another family member.  Please see my note from date of service.  Goals of Care: 1.  Code Status: DNR   2. Scope of Treatment: Family reports patient does not want Morphine given now or in the future.  They feel she is comfortable even when she will cry out.  They want to keep the monitors in place for now despite understanding if will not change outcomes and is not compatible with their goals. They are working through the process. They did not want medications to be changed at this time although we reviewed that hospice would not be continuing IV abx , IV fluids or other curative treatments.     4. Disposition: [facility], prognosis days.    3. Symptom Management:   1. Anxiety/Agitation: continue as needed medications for anxiety, and haldol 2. Pain: tylenol only , family will not permit opiate 3. Metabolic Encephalopathy- symptomatic treatments. 4. Severe malnutrition: comfort feed.   4. Psychosocial: learned to drive when she was 59, [place].  See my note from  date of service.  5. Spiritual: offered spiritual care support        Patient Documents Completed or Given: Document Given Completed  Advanced Directives Pkt    MOST    DNR    Gone from My Sight    Hard Choices      Brief HPI: 80 yr old recently treated for the flu returns with Hcap.  Asked to assist with goals of care as patient has stopped eating and will likely be dialysis dependent although did not tolerate first two treatments.   ROS: unable to obtain due to cognitive status.    PMH:  Past Medical History  Diagnosis Date  . Restrictive cardiomyopathy   . Atrial fibrillation 1996    S/P cardioversion  . CVA (cerebral infarction) 1999  . CPAP (continuous positive airway pressure) dependence   . Anemia     NOS  . Iron deficiency   . Gout   . Peptic ulcer, acute with hemorrhage 2005, 2011  . Hypertension   . Hypothyroidism   . Diastolic dysfunction   . Adenomatous colon polyp   . GERD (gastroesophageal reflux disease)   . Hiatal hernia   . Diverticulosis   . Anxiety disorder   . Arthritis   . Internal and external hemorrhoids without complication   . Morbid obesity   . CAD (coronary artery disease) 08/06/2008    One vessel by cath, EF overall  preserved by echo 04/30/2008  . On home oxygen therapy   . Renal failure     Dr. Hassell Done  . Hyperlipidemia   . CHF (congestive heart failure)   . Complication of anesthesia     pt states she was sore "all-over" for over a week after anesthesia  . COAD (chronic obstructive airways disease) 2007    Exacerbation  . COPD (chronic obstructive pulmonary disease)     oxygen prn  . Sleep apnea     cpap - no longer uses  . Stroke     x2 -- effected vision; left sided slightly weak  . Rash     on upper extremities  . Bowel trouble     having constipation and diarrhea  . Family history of anesthesia complication     daughter had same "sore all over" experience with anesthesia  . AVM (arteriovenous malformation) of small  bowel, acquired     2012 capsule endoscopy     PSH: Past Surgical History  Procedure Laterality Date  . Appendectomy    . Oophorectomy    . Incision and drainage of cellulitis  123456    Umbilical abcess  . Cardiac catheterization      Pulmonary HTN, non obstructive CAD  . Mole removal      on leg  . Vaginal hysterectomy    . Colonoscopy    . Bascilic vein transposition Left     Left upper arm  . Amputation Right 01/11/2014    Procedure: AMPUTATION ABOVE KNEE-RIGHT;  Surgeon: Conrad Port Townsend, MD;  Location: [facility];  Service: Vascular;  Laterality: Right;  . Esophagogastroduodenoscopy    . Givens capsule study     I have reviewed the [place] and SH and  If appropriate update it with new information. Allergies  Allergen Reactions  . Adhesive [Tape]   . Aspirin Other (See Comments)    Tendency to bleed easily   Scheduled Meds: . albuterol  2.5 mg Nebulization TID  . allopurinol  100 mg Oral QODAY  . ALPRAZolam  0.25 mg Oral BID  . ampicillin-sulbactam (UNASYN) IV  1.5 g Intravenous Q12H  . antiseptic oral rinse  15 mL Mouth Rinse BID  . aspirin EC  81 mg Oral Daily  . calcitRIOL  0.5 mcg Oral QODAY  . carvedilol  3.125 mg Oral BID WC  . darbepoetin  25 mcg Subcutaneous Q7 days  . feeding supplement (RESOURCE BREEZE)  1 Container Oral TID BM  . heparin  5,000 Units Subcutaneous 3 times per day  . levothyroxine  75 mcg Oral QAC breakfast  . multivitamin  1 tablet Oral QHS  . pantoprazole  40 mg Oral Daily  . polyethylene glycol  17 g Oral Daily  . sevelamer carbonate  800 mg Oral TID WC  . sodium chloride  3 mL Intravenous Q12H  . sodium chloride  3 mL Intravenous Q12H   Continuous Infusions:  PRN Meds:.sodium chloride, albuterol, haloperidol lactate, HYDROcodone-acetaminophen, ondansetron (ZOFRAN) IV, ondansetron, simethicone, sodium chloride    BP 105/29  Pulse 74  Temp(Src) 97.5 F (36.4 C) (Axillary)  Resp 16  Ht 4\' 9"  (1.448 m)  Wt 40.8 kg (89 lb 15.2 oz)  BMI  19.46 kg/m2  SpO2 99%   PPS: 10 % ( has not eaten in over week, )   Intake/Output Summary (Last 24 hours) at 02/12/14 1624 Last data filed at 02/12/14 1329  Gross per 24 hour  Intake     50 ml  Output      0 ml  Net     50 ml   LBM: 2/26                     Physical Exam:  General: Eyes closed , gentle cry occasionally HEENT:  Pupils not examined does not open eyes Chest:   Decreased no rhonchi, rales CVS: regular, S1, S2 no murmurs Abdomen:soft, not distended, positive bowel sounds Ext: multiple bruises and some joint deformaties Neuro:not interacting won't follow commands.  Labs: CBC    Component Value Date/Time   WBC 8.1 02/07/2014 1726   RBC 2.72* 02/07/2014 1726   HGB 8.7* 02/07/2014 1726   HCT 29.1* 02/07/2014 1726   PLT 144* 02/07/2014 1726   MCV 107.0* 02/07/2014 1726   MCH 32.0 02/07/2014 1726   MCHC 29.9* 02/07/2014 1726   RDW 17.6* 02/07/2014 1726   LYMPHSABS 0.9 02/07/2014 1726   MONOABS 0.6 02/07/2014 1726   EOSABS 0.1 02/07/2014 1726   BASOSABS 0.0 02/07/2014 1726     CMP     Component Value Date/Time   NA 142 02/10/2014 1445   K 3.7 02/10/2014 1445   CL 101 02/10/2014 1445   CO2 24 02/10/2014 1445   GLUCOSE 84 02/10/2014 1445   BUN 52* 02/10/2014 1445   CREATININE 2.84* 02/10/2014 1445   CALCIUM 8.6 02/10/2014 1445   CALCIUM 9.9 12/01/2013 1300   PROT 6.0 02/01/2014 1710   ALBUMIN 2.1* 02/10/2014 1445   AST 18 02/01/2014 1710   ALT 8 02/01/2014 1710   ALKPHOS 81 02/01/2014 1710   BILITOT 0.4 02/01/2014 1710   GFRNONAA 14* 02/10/2014 1445   GFRAA 17* 02/10/2014 1445    Chest Xray Reviewed/Impressions: Moderate opacification over the right mid to lower lung compatible  with a moderate confusion likely with associated atelectasis. Small  left effusion. Cannot exclude infection in the lung bases.  Moderate stable cardiomegaly      Time In Time Out Total Time Spent with Patient Total Overall Time  345 pm 430 pm 45 min 45 min    Greater than 50%  of this time was spent counseling  and coordinating care related to the above assessment and plan.  Kohle Winner L. Lovena Le, MD MBA The Palliative Medicine Team at [facility] Phone: [phone] Pager: [phone]

## 2014-02-12 NOTE — Progress Notes (Signed)
   Palliative care consult noted.  Available as needed.  Adele Barthel, MD [facility] Office: [phone] Pager: [phone]  02/12/2014, 6:08 PM

## 2014-02-12 NOTE — Progress Notes (Signed)
[facility] PROGRESS NOTE Assessment/Plan: Acute on chronic resp failure due to  Acute diastolic heart failure: - Unlikely PNA component to be the cause of culprit.  - Fluid balance improved. - Failed aggressive diuretics treatment. - s/p HD 3.4.2015. - pt cont to be confused despite d/c ativan and IV narcotics for over 24 hrs. - Discontinue broad spectrum antibiotics; will narrow to just augmentin (finish previous antibiotic regimen; 4 days total left)  - pt appears decondition malnourish. - family will like to meet with PMT GOC.  Acute on chronic renal failure (stage 4-5 at baseline):  - Cr improved. - patient is now oliguric/anuric  - will follow renal service rec's  - plan is for HD.  Presumed demand ischemia/elvated troponin:  - Rest of troponin neg. Assumed initial elevated troponin due to renal failure.  - Will continue low dose b-blocker  - Volume to be controlled with HD   PAD:  - S/p right AKA  - Vascular surgery consulted  - Wll follow rec's   Decubitus ulcer:  - Will continue prevention measures.  - Will ordered overlay mattress  - Wound care consulted, will follow rec's.  Delirium: - Use haldol.  Anxiety and pain: will change xanax to klonopin and adjust morphine orders.  -will follow clinical response   Severe protein calorie malnutrition: will follow dietician recommendations  -diet advance to full liquid and if tolerated will advance to regular.    Code Status: Full  Family Communication: granddaughter at bedside  Disposition Plan: to be determine; will most likely need SNF   Consultants:  renal  Procedures:  HD  Antibiotics:  none   HPI/Subjective: lethargic Objective: Filed Vitals:   02/11/14 2325 02/12/14 0500 02/12/14 0620 02/12/14 0950  BP:   102/33   Pulse:   72   Temp:   97.6 F (36.4 C)   TempSrc:   Oral   Resp:   16   Height:      Weight:  40.8 kg (89 lb 15.2 oz)    SpO2: 98%  99% 98%    Intake/Output  Summary (Last 24 hours) at 02/12/14 1048 Last data filed at 02/12/14 [id number]  Gross per 24 hour  Intake      0 ml  Output      0 ml  Net      0 ml   Filed Weights   02/10/14 0757 02/11/14 0525 02/12/14 0500  Weight: 44.7 kg (98 lb 8.7 oz) 45.7 kg (100 lb 12 oz) 40.8 kg (89 lb 15.2 oz)    Exam:  General:  in no acute distress only responsive to pain. HEENT: No bruits, no goiter. +JVD Heart: Regular rate and rhythm, without murmurs, rubs, gallops.  Lungs: Good air movement, clear Abdomen: Soft, nontender, nondistended, positive bowel sounds.    Data Reviewed: Basic Metabolic Panel:  Recent Labs Lab 02/07/14 1726 02/09/14 0333 02/10/14 1445  NA 146 144 142  K 4.5 4.7 3.7  CL 106 105 101  CO2 22 20 24   GLUCOSE 90 98 84  BUN 89* 100* 52*  CREATININE 2.98* 3.74* 2.84*  CALCIUM 9.2 8.7 8.6  PHOS  --  7.2* 5.5*   Liver Function Tests:  Recent Labs Lab 02/09/14 0333 02/10/14 1445  ALBUMIN 2.1* 2.1*   No results found for this basename: LIPASE, AMYLASE,  in the last 168 hours No results found for this basename: AMMONIA,  in the last 168 hours CBC:  Recent Labs Lab 02/07/14 1726  WBC 8.1  NEUTROABS 6.5  HGB 8.7*  HCT 29.1*  MCV 107.0*  PLT 144*   Cardiac Enzymes:  Recent Labs Lab 02/08/14 0116 02/08/14 0350 02/08/14 1023  TROPONINI <0.30 <0.30 <0.30   BNP (last 3 results)  Recent Labs  02/02/14 0138  PROBNP 15475.0*   CBG:  Recent Labs Lab 02/11/14 0056  GLUCAP 77    Recent Results (from the past 240 hour(s))  CLOSTRIDIUM DIFFICILE BY PCR     Status: None   Collection Time    02/02/14  7:09 PM      Result Value Ref Range Status   C difficile by pcr NEGATIVE  NEGATIVE Final   Comment: Performed at [place], EXPECTORATED SPUTUM-ASSESSMENT     Status: None   Collection Time    02/03/14 11:34 AM      Result Value Ref Range Status   Specimen Description SPUTUM   Final   Special Requests NONE   Final   Sputum evaluation      Final   Value: THIS SPECIMEN IS ACCEPTABLE. RESPIRATORY CULTURE REPORT TO FOLLOW.   Report Status 02/03/2014 FINAL   Final  CULTURE, RESPIRATORY (NON-EXPECTORATED)     Status: None   Collection Time    02/03/14 11:34 AM      Result Value Ref Range Status   Specimen Description SPUTUM   Final   Special Requests NONE   Final   Gram Stain     Final   Value: NO WBC SEEN     NO SQUAMOUS EPITHELIAL CELLS SEEN     NO ORGANISMS SEEN     Performed at [facility]   Culture     Final   Value: NORMAL OROPHARYNGEAL FLORA     Performed at [facility]   Report Status 02/05/2014 FINAL   Final  CULTURE, BLOOD (ROUTINE X 2)     Status: None   Collection Time    02/07/14  8:10 PM      Result Value Ref Range Status   Specimen Description BLOOD RIGHT FOREARM   Final   Special Requests BOTTLES DRAWN AEROBIC AND ANAEROBIC 10CC EA   Final   Culture  Setup Time     Final   Value: 02/08/2014 03:29     Performed at [facility]   Culture     Final   Value:        BLOOD CULTURE RECEIVED NO GROWTH TO DATE CULTURE WILL BE HELD FOR 5 DAYS BEFORE ISSUING A FINAL NEGATIVE REPORT     Performed at [facility]   Report Status PENDING   Incomplete  CULTURE, BLOOD (ROUTINE X 2)     Status: None   Collection Time    02/07/14  8:13 PM      Result Value Ref Range Status   Specimen Description BLOOD RIGHT ANTECUBITAL   Final   Special Requests BOTTLES DRAWN AEROBIC AND ANAEROBIC 10CC EA   Final   Culture  Setup Time     Final   Value: 02/08/2014 03:29     Performed at [facility]   Culture     Final   Value:        BLOOD CULTURE RECEIVED NO GROWTH TO DATE CULTURE WILL BE HELD FOR 5 DAYS BEFORE ISSUING A FINAL NEGATIVE REPORT     Performed at [facility]   Report Status PENDING   Incomplete  URINE CULTURE     Status: None  Collection Time    02/07/14  8:42 PM      Result Value Ref Range Status   Specimen Description URINE, CATHETERIZED   Final   Special  Requests NONE   Final   Culture  Setup Time     Final   Value: 02/08/2014 04:04     Performed at [place]     Final   Value: NO GROWTH     Performed at [facility]   Culture     Final   Value: NO GROWTH     Performed at [facility]   Report Status 02/09/2014 FINAL   Final     Studies: No results found.  Scheduled Meds: . albuterol  2.5 mg Nebulization TID  . allopurinol  100 mg Oral QODAY  . ALPRAZolam  0.25 mg Oral BID  . amoxicillin-clavulanate  1 tablet Oral QHS  . antiseptic oral rinse  15 mL Mouth Rinse BID  . aspirin EC  81 mg Oral Daily  . calcitRIOL  0.5 mcg Oral QODAY  . carvedilol  3.125 mg Oral BID WC  . darbepoetin  25 mcg Subcutaneous Q7 days  . feeding supplement (RESOURCE BREEZE)  1 Container Oral TID BM  . heparin  5,000 Units Subcutaneous 3 times per day  . levothyroxine  75 mcg Oral QAC breakfast  . multivitamin  1 tablet Oral QHS  . pantoprazole  40 mg Oral Daily  . polyethylene glycol  17 g Oral Daily  . sevelamer carbonate  800 mg Oral TID WC  . sodium chloride  3 mL Intravenous Q12H  . sodium chloride  3 mL Intravenous Q12H   Continuous Infusions:    Charlynne Cousins  [facility] Pager [phone]. If 8PM-8AM, please contact night-coverage at [email or web address], password [facility] 02/12/2014, 10:48 AM  LOS: 5 days

## 2014-02-12 NOTE — Progress Notes (Signed)
ANTIBIOTIC CONSULT NOTE - FOLLOW UP  Pharmacy Consult for Unasyn Indication: pneumonia  Allergies  Allergen Reactions  . Adhesive [Tape]   . Aspirin Other (See Comments)    Tendency to bleed easily    Patient Measurements: Height: 4\' 9"  (144.8 cm) Weight: 89 lb 15.2 oz (40.8 kg) (without air intake equipment attached to bed) IBW/kg (Calculated) : 38.6 Adjusted Body Weight: n/a  Vital Signs: Temp: 97.5 F (36.4 C) (03/06 1348) Temp src: Axillary (03/06 1348) BP: 105/29 mmHg (03/06 1348) Pulse Rate: 74 (03/06 1348) Intake/Output from previous day:   Intake/Output from this shift: Total I/O In: 50 [IV Piggyback:50] Out: -   Labs:  Recent Labs  02/10/14 1445  CREATININE 2.84*   Estimated Creatinine Clearance: 9.3 ml/min (by C-G formula based on Cr of 2.84). No results found for this basename: VANCOTROUGH, Corlis Leak, VANCORANDOM, [place], GENTPEAK, GENTRANDOM, [place], TOBRAPEAK, TOBRARND, AMIKACINPEAK, AMIKACINTROU, AMIKACIN,  in the last 72 hours   Microbiology: Recent Results (from the past 720 hour(s))  CULTURE, BLOOD (ROUTINE X 2)     Status: None   Collection Time    02/01/14  7:50 PM      Result Value Ref Range Status   Specimen Description BLOOD RIGHT ARM   Final   Special Requests BOTTLES DRAWN AEROBIC AND ANAEROBIC 5ML   Final   Culture  Setup Time     Final   Value: 02/02/2014 00:57     Performed at [facility]   Culture     Final   Value: NO GROWTH 5 DAYS     Performed at [facility]   Report Status 02/08/2014 FINAL   Final  CULTURE, BLOOD (ROUTINE X 2)     Status: None   Collection Time    02/01/14  8:10 PM      Result Value Ref Range Status   Specimen Description BLOOD RIGHT FOREARM   Final   Special Requests BOTTLES DRAWN AEROBIC ONLY 2ML   Final   Culture  Setup Time     Final   Value: 02/02/2014 00:57     Performed at [facility]   Culture     Final   Value: NO GROWTH 5 DAYS     Performed at [facility]   Report Status 02/08/2014 FINAL   Final  URINE CULTURE     Status: None   Collection Time    02/01/14  8:26 PM      Result Value Ref Range Status   Specimen Description URINE, CLEAN CATCH   Final   Special Requests NONE   Final   Culture  Setup Time     Final   Value: 02/02/2014 01:10     Performed at [place]     Final   Value: >=100,000 COLONIES/ML     Performed at [facility]   Culture     Final   Value: PROTEUS MIRABILIS     Performed at [facility]   Report Status 02/04/2014 FINAL   Final   Organism ID, Bacteria PROTEUS MIRABILIS   Final  MRSA PCR SCREENING     Status: None   Collection Time    02/02/14 10:16 AM      Result Value Ref Range Status   MRSA by PCR NEGATIVE  NEGATIVE Final   Comment:            The GeneXpert MRSA Assay (FDA     approved  for NASAL specimens     only), is one component of a     comprehensive MRSA colonization     surveillance program. It is not     intended to diagnose MRSA     infection nor to guide or     monitor treatment for     MRSA infections.  CLOSTRIDIUM DIFFICILE BY PCR     Status: None   Collection Time    02/02/14  7:09 PM      Result Value Ref Range Status   C difficile by pcr NEGATIVE  NEGATIVE Final   Comment: Performed at [place], EXPECTORATED SPUTUM-ASSESSMENT     Status: None   Collection Time    02/03/14 11:34 AM      Result Value Ref Range Status   Specimen Description SPUTUM   Final   Special Requests NONE   Final   Sputum evaluation     Final   Value: THIS SPECIMEN IS ACCEPTABLE. RESPIRATORY CULTURE REPORT TO FOLLOW.   Report Status 02/03/2014 FINAL   Final  CULTURE, RESPIRATORY (NON-EXPECTORATED)     Status: None   Collection Time    02/03/14 11:34 AM      Result Value Ref Range Status   Specimen Description SPUTUM   Final   Special Requests NONE   Final   Gram Stain     Final   Value: NO WBC SEEN     NO SQUAMOUS EPITHELIAL CELLS  SEEN     NO ORGANISMS SEEN     Performed at [facility]   Culture     Final   Value: NORMAL OROPHARYNGEAL FLORA     Performed at [facility]   Report Status 02/05/2014 FINAL   Final  CULTURE, BLOOD (ROUTINE X 2)     Status: None   Collection Time    02/07/14  8:10 PM      Result Value Ref Range Status   Specimen Description BLOOD RIGHT FOREARM   Final   Special Requests BOTTLES DRAWN AEROBIC AND ANAEROBIC 10CC EA   Final   Culture  Setup Time     Final   Value: 02/08/2014 03:29     Performed at [facility]   Culture     Final   Value:        BLOOD CULTURE RECEIVED NO GROWTH TO DATE CULTURE WILL BE HELD FOR 5 DAYS BEFORE ISSUING A FINAL NEGATIVE REPORT     Performed at [facility]   Report Status PENDING   Incomplete  CULTURE, BLOOD (ROUTINE X 2)     Status: None   Collection Time    02/07/14  8:13 PM      Result Value Ref Range Status   Specimen Description BLOOD RIGHT ANTECUBITAL   Final   Special Requests BOTTLES DRAWN AEROBIC AND ANAEROBIC 10CC EA   Final   Culture  Setup Time     Final   Value: 02/08/2014 03:29     Performed at [facility]   Culture     Final   Value:        BLOOD CULTURE RECEIVED NO GROWTH TO DATE CULTURE WILL BE HELD FOR 5 DAYS BEFORE ISSUING A FINAL NEGATIVE REPORT     Performed at [facility]   Report Status PENDING   Incomplete  URINE CULTURE     Status: None   Collection Time    02/07/14  8:42 PM  Result Value Ref Range Status   Specimen Description URINE, CATHETERIZED   Final   Special Requests NONE   Final   Culture  Setup Time     Final   Value: 02/08/2014 04:04     Performed at [place]     Final   Value: NO GROWTH     Performed at [facility]   Culture     Final   Value: NO GROWTH     Performed at [facility]   Report Status 02/09/2014 FINAL   Final    Anti-infectives   Start     Dose/Rate Route Frequency Ordered Stop   02/12/14  1200  ampicillin-sulbactam (UNASYN) 1.5 g in sodium chloride 0.9 % 50 mL IVPB     1.5 g 100 mL/hr over 30 Minutes Intravenous Every 12 hours 02/12/14 1050     02/10/14 1330  amoxicillin-clavulanate (AUGMENTIN) 500-125 MG per tablet 500 mg     1 tablet Oral  Once 02/10/14 1255 02/10/14 1810   02/09/14 1404  amoxicillin-clavulanate (AUGMENTIN) 500-125 MG per tablet 500 mg  Status:  Discontinued     1 tablet Oral Daily at bedtime 02/09/14 1405 02/12/14 1050   02/08/14 1600  amoxicillin-clavulanate (AUGMENTIN) 250-125 MG per tablet 1 tablet  Status:  Discontinued     1 tablet Oral 2 times daily 02/08/14 1404 02/09/14 1405   02/07/14 2315  ceFEPIme (MAXIPIME) 1 g in dextrose 5 % 50 mL IVPB  Status:  Discontinued     1 g 100 mL/hr over 30 Minutes Intravenous 3 times per day 02/07/14 2309 02/07/14 2315   02/07/14 2100  ceFEPIme (MAXIPIME) 500 mg in dextrose 5 % 50 mL IVPB  Status:  Discontinued     500 mg 100 mL/hr over 30 Minutes Intravenous Every 24 hours 02/07/14 1953 02/08/14 1404   02/07/14 2030  vancomycin (VANCOCIN) 500 mg in sodium chloride 0.9 % 100 mL IVPB  Status:  Discontinued     500 mg 100 mL/hr over 60 Minutes Intravenous Every 48 hours 02/07/14 1953 02/08/14 1404      Assessment: 84 YOF who was de-escalated to augmentin scheduled to be completed on 3/7 now switched to Unasyn due to patient's inability to swallow. WBC wnl, afebrile, On HD   3/1 Vanc>>3/2 3/1 Cefepime>>3/2 Augmentin 3/2>> 3/6 Unasyn 3/6>>   3/1 Blood Cx x2 >>ngtd  3/1 Urine Cx>>neg     Goal of Therapy:  Eradication of infection  Plan:  1) Unasyn 1.5 gm IV Q 12 hours 2) Since Unasyn is on short supply, consider one of the following:    -Change abx therapy to Rocephin and flagyl for similar coverage    -Change abx therapy to Zosyn if monotherapy desired    - Re-establish stop date of 3/7 of Unasyn order.  3) Monitor CBC and patient's clinical progress  Albertina Parr, PharmD.  Clinical  Pharmacist Pager [phone]

## 2014-02-12 NOTE — Consult Note (Signed)
Patient [id number] [name]      DOB: 07/16/1929      QPY:[id number]  Summary of Goals of Care; full note to follow:  Met with daughter [name] and her two daughters Mariann Laster  and [place], along with the patient's spouse Alveta Heimlich.  They recalled their life together meeting in [place], courting for 4 yrs and then marriage during [place] sent in the [place] during the [place].  [name] grew up in a plank house with dirt floor and her mother died when she was very young so she passed between her bother and sisters homes.   She learned to drive when she was 42.  Family states she is very strong. She waited 10 weeks before amputating her limb.  She never really wanted to have dialysis and so her family states her body confirmed that when they insisted she try despite her not really wanting to have dialysis. Her husband has full capacity for decision making and at this time tells me that she has always said that she wants to die in her sleep and so he does not want to force feed her and wants her to be a DNR.  Family states that even though Aprill whimpers that they do not interpret this as pain.  They state that she hated morphine and does not want to be given any.   Family is familiar with [facility] home where Mr. [name] passed away.  I will attempt to contact [facility], SW and Renal to update them on the families wishes.   Total time: 345- 430 pm  Oluwadamilola Rosamond L. Lovena Le, MD MBA The Palliative Medicine Team at [facility] Phone: [phone] Pager: [phone]

## 2014-02-12 NOTE — Progress Notes (Signed)
I note plans for hospice and therefore we will sign off. [name] C

## 2014-02-12 NOTE — Progress Notes (Signed)
Patient [name]      DOB: 23-Jul-1929      [id number]  Spoke with patient's daughter [name].  She would like me to come later this am for [name] conversation.  Will attempt to arrive between 1230 and 1 pm.  [name] L. Lovena Le, MD MBA The Palliative Medicine Team at [facility] Phone: [phone] Pager: [phone]

## 2014-02-12 NOTE — Progress Notes (Signed)
Pt with a 4.9 kg "weight gain" this AM. Pt weighed accurately this AM. Weight gain may be due to difference in beds, as pt now has a specialty bed with a mattress overlay. Air intake equipment at foot of bed was removed for this AM's weight.

## 2014-02-12 NOTE — Progress Notes (Signed)
PT Cancellation Note  Patient Details Name: MAKAYLIE DEDEAUX MRN: [id number] DOB: [birth year]   Cancelled Treatment:    Reason Eval/Treat Not Completed: Fatigue/lethargy limiting ability to participate. Family present, pt asleep and family request defer until Monday after [place] meeting today. They will decide by then if they desire continued attempts at therapy or sign off. Will check back next week.    Lanetta Inch Beth 02/12/2014, 11:25 AM Elwyn Reach, [place]

## 2014-02-12 NOTE — Progress Notes (Signed)
Patient [id number] [name]      DOB: [birth year]      [id number]  Conflict of time had to reschedule meeting for 4 pm today.  Margaruite Top L. Lovena Le, MD MBA The Palliative Medicine Team at [facility] Phone: [phone] Pager: [phone]

## 2014-02-12 NOTE — Care Management Note (Addendum)
  Page 1 of 1   02/12/2014     11:29:40 AM   CARE MANAGEMENT NOTE 02/12/2014  Patient:  [name], [name]   Account Number:  [id number]  Date Initiated:  02/12/2014  Documentation initiated by:  Mariann Laster  Subjective/Objective Assessment:   admitted with [facility]     Action/Plan:   CM to follow for disposition needs   Anticipated DC Date:  02/12/2014   Anticipated DC Plan:  [place]  CM consult      Choice offered to / List presented to:             Status of service:  In process, will continue to follow Medicare Important Message given?   (If response is "NO", the following Medicare IM given date fields will be blank) Date Medicare IM given:   Date Additional Medicare IM given:    Discharge Disposition:    Per UR Regulation:  Reviewed for med. necessity/level of care/duration of stay  If discussed at [place] of Stay Meetings, dates discussed:    Comments:  02/12/2014 Social:  From home with DTR/Deborah Plan for today:  Failed aggressive diuretics treatment. Goals of Care/Palliative meeting today 12:30 - 1:00 PT Recs:  pending_________ Disposition Plan:  pending [place], BSN, [place], CCM 02/12/2014

## 2014-02-13 LAB — RENAL FUNCTION PANEL
ALBUMIN: 2.1 g/dL — AB (ref 3.5–5.2)
BUN: 81 mg/dL — AB (ref 6–23)
CO2: 21 mEq/L (ref 19–32)
Calcium: 9 mg/dL (ref 8.4–10.5)
Chloride: 102 mEq/L (ref 96–112)
Creatinine, Ser: 4.63 mg/dL — ABNORMAL HIGH (ref 0.50–1.10)
GFR calc Af Amer: 9 mL/min — ABNORMAL LOW (ref >90)
GFR calc non Af Amer: 8 mL/min — ABNORMAL LOW (ref >90)
Glucose, Bld: 67 mg/dL — ABNORMAL LOW (ref 70–99)
PHOSPHORUS: 8 mg/dL — AB (ref 2.3–4.6)
POTASSIUM: 4.9 meq/L (ref 3.7–5.3)
Sodium: 146 mEq/L (ref 137–147)

## 2014-02-13 LAB — CBC
HEMATOCRIT: 26.7 % — AB (ref 36.0–46.0)
Hemoglobin: 8.2 g/dL — ABNORMAL LOW (ref 12.0–15.0)
MCH: 32.9 pg (ref 26.0–34.0)
MCHC: 30.7 g/dL (ref 30.0–36.0)
MCV: 107.2 fL — ABNORMAL HIGH (ref 78.0–100.0)
PLATELETS: 122 10*3/uL — AB (ref 150–400)
RBC: 2.49 MIL/uL — ABNORMAL LOW (ref 3.87–5.11)
RDW: 18.1 % — AB (ref 11.5–15.5)
WBC: 7.2 10*3/uL (ref 4.0–10.5)

## 2014-02-13 MED ORDER — MORPHINE SULFATE (CONCENTRATE) 20 MG/ML PO SOLN: 10.0000 mg | ORAL | Status: AC | PRN

## 2014-02-13 NOTE — Discharge Summary (Signed)
Physician Discharge Summary  [name] XYI:[id number] DOB: 12/01/1929 DOA: 02/07/2014  PCP: Unice Cobble, MD  Admit date: 02/07/2014 Discharge date: 02/13/2014  Time spent: 35 minutes  Recommendations for Outpatient Follow-up:  1. She will [facility].  Discharge Diagnoses:  Principal Problem:   Bilateral pneumonia Active Problems:   Healthcare-associated pneumonia   Demand ischemia of myocardium   Severe protein-calorie malnutrition   Discharge Condition: guarded  Diet recommendation: comfort feeds  Filed Weights   02/11/14 0525 02/12/14 0500 02/13/14 0517  Weight: 45.7 kg (100 lb 12 oz) 40.8 kg (89 lb 15.2 oz) 40.1 kg (88 lb 6.5 oz)    History of present illness:  80 y.o. female, with past medical history significant for restrictive cardiomyopathy, renal failure to be started on dialysis on who was recently admitted for flu A and discharged to 2 days ago , presenting today with shortness of breath and left shoulder pain. Patient denies fever chills nausea or vomiting. Patient reports nonproductive cough. Also reports orthopnea .  Chest x-ray showed bilateral basal pneumonia and I was called to admit . Her troponin was slightly elevated and the patient was mildly hypotensive in the emergency room.   Hospital Course:  Acute on chronic resp failure due to Acute diastolic heart failure:  - Unlikely PNA component to be the cause of culprit.  - Failed aggressive diuretics treatment. - s/p HD 3.4.2015.  - pt cont to be confused despite d/c ativan and IV narcotics for over 24 hrs.  - family will like to met with PMT and decided to move toward comfort care. - [place] [facility] [place] [facility] [place].  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 Not able to sit down. - family expressed she would've not like HD and was stopped.  Presumed demand  ischemia/elvated troponin:  PAD:  - S/p right AKA  Decubitus ulcer:  Delirium:  Anxiety and pain: will change xanax to klonopin and adjust morphine orders.  Severe protein calorie malnutrition: will follow dietician recommendations   Procedures:  CXR  Consultations:  Renal  PMT  Discharge Exam: Filed Vitals:   02/13/14 0900  BP: 110/39  Pulse: 68  Temp: 97.3 F (36.3 C)  Resp: 16    General: A&O x3 Cardiovascular: RRR Respiratory: good air movement CTA B/L  Discharge Instructions      Discharge Orders   Future Appointments Provider Department Dept Phone   03/24/2014 11:00 AM Meredith Staggers, MD [facility] [phone]   Future Orders Complete By Expires   Diet - low sodium heart healthy  As directed    Increase activity slowly  As directed        Medication List    STOP taking these medications       acetaminophen 325 MG tablet  Commonly known as:  TYLENOL     allopurinol 100 MG tablet  Commonly known as:  ZYLOPRIM     calcitRIOL 0.5 MCG capsule  Commonly known as:  ROCALTROL     HYDROcodone-acetaminophen 5-325 MG per tablet  Commonly known as:  NORCO/VICODIN     levofloxacin 500 MG tablet  Commonly known as:  LEVAQUIN     levothyroxine 75 MCG tablet  Commonly known as:  SYNTHROID, LEVOTHROID     losartan 25 MG tablet  Commonly known as:  COZAAR  metolazone 2.5 MG tablet  Commonly known as:  ZAROXOLYN     oseltamivir 30 MG capsule  Commonly known as:  TAMIFLU     pantoprazole 40 MG tablet  Commonly known as:  PROTONIX     polyethylene glycol packet  Commonly known as:  MIRALAX / GLYCOLAX     potassium chloride SA 20 MEQ tablet  Commonly known as:  K-DUR,KLOR-CON     PROCRIT 01751 UNIT/ML injection  Generic drug:  epoetin alfa     simethicone 80 MG chewable tablet  Commonly known as:  MYLICON     torsemide 20 MG tablet  Commonly known as:  DEMADEX     vitamin C 500 MG tablet   Commonly known as:  ASCORBIC ACID      TAKE these medications       ALPRAZolam 0.25 MG tablet  Commonly known as:  XANAX  Take 1 tablet (0.25 mg total) by mouth 2 (two) times daily.     morphine 20 MG/ML concentrated solution  Commonly known as:  ROXANOL  Take 0.5 mLs (10 mg total) by mouth every 2 (two) hours as needed for severe pain.       Allergies  Allergen Reactions  . Adhesive [Tape]   . Aspirin Other (See Comments)    Tendency to bleed easily      The results of significant diagnostics from this hospitalization (including imaging, microbiology, ancillary and laboratory) are listed below for reference.    Significant Diagnostic Studies: Dg Chest 2 View  02/07/2014   CLINICAL DATA:  Chest pain and shortness of breath. Hypertension/ CHF.  EXAM: CHEST  2 VIEW  COMPARISON:  02/01/2014 and 01/11/2014  FINDINGS: Patient is rotated to the right. Exam demonstrates somewhat low lung volumes with new moderate opacification over the right mid to lower lung likely moderate size effusion with atelectasis. There is a small left effusion likely with associated atelectasis. Cannot exclude infection in the lung bases. There is moderate stable cardiomegaly. There is calcified plaque over the thoracoabdominal aorta. Remainder of the exam is unchanged.  IMPRESSION: Moderate opacification over the right mid to lower lung compatible with a moderate confusion likely with associated atelectasis. Small left effusion. Cannot exclude infection in the lung bases.  Moderate stable cardiomegaly.   Electronically Signed   By: Marin Olp M.D.   On: 02/07/2014 19:32   Dg Abd 1 View  01/20/2014   CLINICAL DATA:  Abdominal bloating and nausea for the past 3 months.  EXAM: ABDOMEN - 1 VIEW  COMPARISON:  No priors.  FINDINGS: Gas and stool are noted throughout the colon extending to the level of the distal rectum. No pathologic distention of small bowel. No gross pneumoperitoneum on this single supine view of  the abdomen. Extensive vascular calcifications are noted throughout the abdominal and pelvic vasculature.  IMPRESSION: 1. Nonobstructive bowel gas pattern. 2. No pneumoperitoneum. 3. Severe atherosclerosis.   Electronically Signed   By: Vinnie Langton M.D.   On: 01/20/2014 10:04   Dg Chest Port 1 View  02/01/2014   CLINICAL DATA:  Fever, cough, weakness and shortness of breath.  EXAM: PORTABLE CHEST - 1 VIEW  COMPARISON:  Chest radiograph from 01/11/2014  FINDINGS: The lungs are well-aerated. Vascular congestion is noted, with mildly increased interstitial markings. Mild bibasilar airspace opacities are seen. A small left pleural effusion is suspected. Opacities may reflect atelectasis, though pneumonia cannot be excluded. No pneumothorax is identified.  The cardiomediastinal silhouette is enlarged. No acute osseous abnormalities  are seen.  IMPRESSION: Vascular congestion and cardiomegaly, with mildly increased interstitial markings. Mild bibasilar airspace opacities seen; small left pleural effusion suspected. Opacities may reflect atelectasis, though pneumonia cannot be excluded, particularly given the patient's symptoms.   Electronically Signed   By: Garald Balding M.D.   On: 02/01/2014 21:48    Microbiology: Recent Results (from the past 240 hour(s))  CULTURE, EXPECTORATED SPUTUM-ASSESSMENT     Status: None   Collection Time    02/03/14 11:34 AM      Result Value Ref Range Status   Specimen Description SPUTUM   Final   Special Requests NONE   Final   Sputum evaluation     Final   Value: THIS SPECIMEN IS ACCEPTABLE. RESPIRATORY CULTURE REPORT TO FOLLOW.   Report Status 02/03/2014 FINAL   Final  CULTURE, RESPIRATORY (NON-EXPECTORATED)     Status: None   Collection Time    02/03/14 11:34 AM      Result Value Ref Range Status   Specimen Description SPUTUM   Final   Special Requests NONE   Final   Gram Stain     Final   Value: NO WBC SEEN     NO SQUAMOUS EPITHELIAL CELLS SEEN     NO  ORGANISMS SEEN     Performed at [facility]   Culture     Final   Value: NORMAL OROPHARYNGEAL FLORA     Performed at [facility]   Report Status 02/05/2014 FINAL   Final  CULTURE, BLOOD (ROUTINE X 2)     Status: None   Collection Time    02/07/14  8:10 PM      Result Value Ref Range Status   Specimen Description BLOOD RIGHT FOREARM   Final   Special Requests BOTTLES DRAWN AEROBIC AND ANAEROBIC 10CC EA   Final   Culture  Setup Time     Final   Value: 02/08/2014 03:29     Performed at [facility]   Culture     Final   Value:        BLOOD CULTURE RECEIVED NO GROWTH TO DATE CULTURE WILL BE HELD FOR 5 DAYS BEFORE ISSUING A FINAL NEGATIVE REPORT     Performed at [facility]   Report Status PENDING   Incomplete  CULTURE, BLOOD (ROUTINE X 2)     Status: None   Collection Time    02/07/14  8:13 PM      Result Value Ref Range Status   Specimen Description BLOOD RIGHT ANTECUBITAL   Final   Special Requests BOTTLES DRAWN AEROBIC AND ANAEROBIC 10CC EA   Final   Culture  Setup Time     Final   Value: 02/08/2014 03:29     Performed at [facility]   Culture     Final   Value:        BLOOD CULTURE RECEIVED NO GROWTH TO DATE CULTURE WILL BE HELD FOR 5 DAYS BEFORE ISSUING A FINAL NEGATIVE REPORT     Performed at [facility]   Report Status PENDING   Incomplete  URINE CULTURE     Status: None   Collection Time    02/07/14  8:42 PM      Result Value Ref Range Status   Specimen Description URINE, CATHETERIZED   Final   Special Requests NONE   Final   Culture  Setup Time     Final   Value: 02/08/2014 04:04  Performed at [place]     Final   Value: NO GROWTH     Performed at [facility]   Culture     Final   Value: NO GROWTH     Performed at [facility]   Report Status 02/09/2014 FINAL   Final     Labs: Basic Metabolic Panel:  Recent Labs Lab 02/07/14 1726 02/09/14 0333 02/10/14 1445  02/13/14 0531  NA 146 144 142 146  K 4.5 4.7 3.7 4.9  CL 106 105 101 102  CO2 '22 20 24 21  ' GLUCOSE 90 98 84 67*  BUN 89* 100* 52* 81*  CREATININE 2.98* 3.74* 2.84* 4.63*  CALCIUM 9.2 8.7 8.6 9.0  PHOS  --  7.2* 5.5* 8.0*   Liver Function Tests:  Recent Labs Lab 02/09/14 0333 02/10/14 1445 02/13/14 0531  ALBUMIN 2.1* 2.1* 2.1*   No results found for this basename: LIPASE, AMYLASE,  in the last 168 hours No results found for this basename: AMMONIA,  in the last 168 hours CBC:  Recent Labs Lab 02/07/14 1726 02/13/14 0521  WBC 8.1 7.2  NEUTROABS 6.5  --   HGB 8.7* 8.2*  HCT 29.1* 26.7*  MCV 107.0* 107.2*  PLT 144* 122*   Cardiac Enzymes:  Recent Labs Lab 02/08/14 0116 02/08/14 0350 02/08/14 1023  TROPONINI <0.30 <0.30 <0.30   BNP: BNP (last 3 results)  Recent Labs  02/02/14 0138  PROBNP 15475.0*   CBG:  Recent Labs Lab 02/11/14 0056  GLUCAP 77     Signed:  FELIZ ORTIZ, Horald Birky  [facility] 02/13/2014, 10:30 AM

## 2014-02-13 NOTE — Clinical Social Work Note (Signed)
Per MD patient ready to DC to [facility]. DC packet on chart. DNR form signed by MD. CSW requested next available ambulance transport. Patient's family notified of DC. RN given number for report. CSW signing off.  Liz [place], [place], [place], [phone]

## 2014-02-13 NOTE — Progress Notes (Signed)
Patient JE:HUDJS LERLENE TREADWELL      DOB: 13-May-1929      HFW:[id number]  Fielded call from granddaughter and communicated with weekend Education officer, museum.  Transfer when bed available.  Family was able to accept some use of low dose morphine overnight, they are happy with level of care.   Emilya Justen L. Lovena Le, MD MBA The Palliative Medicine Team at [facility] Phone: [phone] Pager: [phone]

## 2014-02-13 NOTE — Progress Notes (Signed)
Released to be transported to [facility] via ambulance.  Copy of AVS placed in packet with papers.  Report called to intake facility.

## 2014-02-13 NOTE — Progress Notes (Signed)
When doing dressing change, grand-daughter and nurse noticed that the bed was stained yellow as if the patient had voided in the bed. Patient has been turned every two hours by the staff and no urine was noted at all during those times until around 3:30am. Informed grand-dtr that the doctor would be notified in the morning of patient voiding. Notified oncoming nurse to inform day shift nurse of this occurrence. Nurse signing off at this time.

## 2014-02-13 NOTE — Progress Notes (Signed)
Patient is now resting and is not moaning, crying, grimacing. Will continue to monitor patient to end of shift.

## 2014-02-14 LAB — CULTURE, BLOOD (ROUTINE X 2)
CULTURE: NO GROWTH
Culture: NO GROWTH

## 2014-03-10 DEATH — deceased

## 2014-03-24 ENCOUNTER — Encounter: Payer: Medicare Other | Admitting: Physical Medicine & Rehabilitation

## 2014-03-26 ENCOUNTER — Telehealth: Payer: Self-pay

## 2014-03-26 NOTE — Telephone Encounter (Signed)
Patient past away @ [place] [facility] [employer] per [place]

## 2014-10-06 ENCOUNTER — Other Ambulatory Visit: Payer: Self-pay | Admitting: Nurse Practitioner

## 2014-11-18 ENCOUNTER — Encounter (HOSPITAL_COMMUNITY): Payer: Self-pay | Admitting: Vascular Surgery
